# Patient Record
Sex: Female | Born: 1965
Health system: Southern US, Community
[De-identification: ages and names within clinical notes are randomized; demographics above are authoritative.]

## PROBLEM LIST (undated history)

## (undated) DIAGNOSIS — M329 Systemic lupus erythematosus, unspecified: Secondary | ICD-10-CM

## (undated) DIAGNOSIS — M109 Gout, unspecified: Secondary | ICD-10-CM

## (undated) DIAGNOSIS — I739 Peripheral vascular disease, unspecified: Secondary | ICD-10-CM

## (undated) DIAGNOSIS — M1990 Unspecified osteoarthritis, unspecified site: Secondary | ICD-10-CM

## (undated) DIAGNOSIS — K219 Gastro-esophageal reflux disease without esophagitis: Secondary | ICD-10-CM

## (undated) DIAGNOSIS — U071 COVID-19: Secondary | ICD-10-CM

## (undated) DIAGNOSIS — S68119A Complete traumatic metacarpophalangeal amputation of unspecified finger, initial encounter: Secondary | ICD-10-CM

## (undated) DIAGNOSIS — I10 Essential (primary) hypertension: Secondary | ICD-10-CM

## (undated) DIAGNOSIS — I509 Heart failure, unspecified: Secondary | ICD-10-CM

## (undated) DIAGNOSIS — R011 Cardiac murmur, unspecified: Secondary | ICD-10-CM

## (undated) DIAGNOSIS — I219 Acute myocardial infarction, unspecified: Secondary | ICD-10-CM

## (undated) DIAGNOSIS — E7800 Pure hypercholesterolemia, unspecified: Secondary | ICD-10-CM

## (undated) DIAGNOSIS — IMO0002 Reserved for concepts with insufficient information to code with codable children: Secondary | ICD-10-CM

## (undated) DIAGNOSIS — N189 Chronic kidney disease, unspecified: Secondary | ICD-10-CM

## (undated) DIAGNOSIS — R7989 Other specified abnormal findings of blood chemistry: Secondary | ICD-10-CM

## (undated) HISTORY — PX: OTHER SURGICAL HISTORY: SHX169

## (undated) HISTORY — PX: CARDIAC CATHETERIZATION: SHX172

---

## 2005-09-06 ENCOUNTER — Ambulatory Visit: Payer: Self-pay | Admitting: Internal Medicine

## 2005-10-06 ENCOUNTER — Ambulatory Visit: Payer: Self-pay | Admitting: Internal Medicine

## 2005-10-08 ENCOUNTER — Encounter (INDEPENDENT_AMBULATORY_CARE_PROVIDER_SITE_OTHER): Payer: Self-pay | Admitting: *Deleted

## 2005-10-08 ENCOUNTER — Ambulatory Visit: Payer: Self-pay | Admitting: Internal Medicine

## 2005-10-08 LAB — CONVERTED CEMR LAB
Bilirubin Urine: NEGATIVE
Ketones, ur: NEGATIVE mg/dL
Nitrite: POSITIVE
Protein, ur: 30 mg/dL
RBC count: 4.41 10*6/uL
Specific Gravity, Urine: 1.015
Urine Glucose: NEGATIVE mg/dL
Urobilinogen, UA: 1
WBC, blood: 3.8 10*3/uL
pH: 6

## 2005-10-22 ENCOUNTER — Ambulatory Visit: Payer: Self-pay | Admitting: Internal Medicine

## 2005-11-19 ENCOUNTER — Ambulatory Visit: Payer: Self-pay | Admitting: Internal Medicine

## 2005-12-24 ENCOUNTER — Ambulatory Visit: Payer: Self-pay | Admitting: Internal Medicine

## 2006-02-02 ENCOUNTER — Encounter: Payer: Self-pay | Admitting: Internal Medicine

## 2006-02-02 DIAGNOSIS — I10 Essential (primary) hypertension: Secondary | ICD-10-CM | POA: Insufficient documentation

## 2006-02-02 DIAGNOSIS — G43909 Migraine, unspecified, not intractable, without status migrainosus: Secondary | ICD-10-CM | POA: Insufficient documentation

## 2006-02-02 DIAGNOSIS — N318 Other neuromuscular dysfunction of bladder: Secondary | ICD-10-CM | POA: Insufficient documentation

## 2006-02-02 DIAGNOSIS — K219 Gastro-esophageal reflux disease without esophagitis: Secondary | ICD-10-CM | POA: Insufficient documentation

## 2006-02-02 DIAGNOSIS — M109 Gout, unspecified: Secondary | ICD-10-CM | POA: Insufficient documentation

## 2006-02-02 DIAGNOSIS — F329 Major depressive disorder, single episode, unspecified: Secondary | ICD-10-CM | POA: Insufficient documentation

## 2006-02-02 DIAGNOSIS — J309 Allergic rhinitis, unspecified: Secondary | ICD-10-CM | POA: Insufficient documentation

## 2006-02-02 DIAGNOSIS — J45909 Unspecified asthma, uncomplicated: Secondary | ICD-10-CM | POA: Insufficient documentation

## 2006-02-02 DIAGNOSIS — K589 Irritable bowel syndrome without diarrhea: Secondary | ICD-10-CM | POA: Insufficient documentation

## 2006-02-07 ENCOUNTER — Encounter (INDEPENDENT_AMBULATORY_CARE_PROVIDER_SITE_OTHER): Payer: Self-pay | Admitting: *Deleted

## 2006-02-07 ENCOUNTER — Ambulatory Visit: Payer: Self-pay | Admitting: Internal Medicine

## 2006-02-07 LAB — CONVERTED CEMR LAB
Cholesterol: 208 mg/dL
HDL: 58 mg/dL
LDL Cholesterol: 133 mg/dL
Total CHOL/HDL Ratio: 3.6
Triglycerides: 87 mg/dL
VLDL: 17 mg/dL

## 2006-03-10 ENCOUNTER — Ambulatory Visit: Payer: Self-pay | Admitting: Internal Medicine

## 2006-03-10 LAB — CONVERTED CEMR LAB
ALT: 136 units/L — ABNORMAL HIGH (ref 0–35)
AST: 161 units/L — ABNORMAL HIGH (ref 0–37)
Albumin: 3.9 g/dL (ref 3.5–5.2)
Alkaline Phosphatase: 96 units/L (ref 39–117)
BUN: 6 mg/dL (ref 6–23)
Basophils Absolute: 0 10*3/uL (ref 0.0–0.1)
Basophils Relative: 0 % (ref 0–1)
CO2: 31 meq/L (ref 19–32)
CRP: 0.9 mg/dL — ABNORMAL HIGH (ref <0.6)
Calcium: 9 mg/dL (ref 8.4–10.5)
Chloride: 101 meq/L (ref 96–112)
Creatinine, Ser: 0.74 mg/dL (ref 0.40–1.20)
Eosinophils Relative: 2 % (ref 0–5)
Glucose, Bld: 107 mg/dL — ABNORMAL HIGH (ref 70–99)
HCT: 45.9 % (ref 36.0–46.0)
Hemoglobin: 15.1 g/dL — ABNORMAL HIGH (ref 12.0–15.0)
Lymphocytes Relative: 30 % (ref 12–46)
Lymphs Abs: 1.5 10*3/uL (ref 0.7–3.3)
MCHC: 33 g/dL (ref 30.0–36.0)
MCV: 95.1 fL (ref 78.0–100.0)
Monocytes Absolute: 0.6 10*3/uL (ref 0.2–0.7)
Monocytes Relative: 11 % (ref 3–11)
Neutro Abs: 2.7 10*3/uL (ref 1.7–7.7)
Neutrophils Relative %: 55 % (ref 43–77)
Platelets: 177 10*3/uL (ref 150–400)
Potassium: 3 meq/L — ABNORMAL LOW (ref 3.5–5.3)
RBC: 4.82 M/uL (ref 3.87–5.11)
RDW: 11.8 % (ref 11.5–14.0)
Sed Rate: 9 mm/hr (ref 0–22)
Sodium: 141 meq/L (ref 135–145)
Total Bilirubin: 2.1 mg/dL — ABNORMAL HIGH (ref 0.3–1.2)
Total Protein: 7.6 g/dL (ref 6.0–8.3)
WBC: 4.9 10*3/uL (ref 4.0–10.5)

## 2006-04-21 ENCOUNTER — Encounter (INDEPENDENT_AMBULATORY_CARE_PROVIDER_SITE_OTHER): Payer: Self-pay | Admitting: Internal Medicine

## 2006-05-17 ENCOUNTER — Telehealth (INDEPENDENT_AMBULATORY_CARE_PROVIDER_SITE_OTHER): Payer: Self-pay | Admitting: *Deleted

## 2006-05-24 ENCOUNTER — Encounter (INDEPENDENT_AMBULATORY_CARE_PROVIDER_SITE_OTHER): Payer: Self-pay | Admitting: *Deleted

## 2006-05-25 ENCOUNTER — Ambulatory Visit: Payer: Self-pay | Admitting: Internal Medicine

## 2006-05-25 DIAGNOSIS — M329 Systemic lupus erythematosus, unspecified: Secondary | ICD-10-CM | POA: Insufficient documentation

## 2006-06-02 ENCOUNTER — Encounter (INDEPENDENT_AMBULATORY_CARE_PROVIDER_SITE_OTHER): Payer: Self-pay | Admitting: Internal Medicine

## 2006-06-22 ENCOUNTER — Ambulatory Visit: Payer: Self-pay | Admitting: Internal Medicine

## 2006-06-26 ENCOUNTER — Encounter (INDEPENDENT_AMBULATORY_CARE_PROVIDER_SITE_OTHER): Payer: Self-pay | Admitting: Internal Medicine

## 2006-08-16 ENCOUNTER — Ambulatory Visit: Payer: Self-pay | Admitting: Internal Medicine

## 2006-09-27 ENCOUNTER — Ambulatory Visit: Payer: Self-pay | Admitting: Internal Medicine

## 2006-09-29 ENCOUNTER — Telehealth (INDEPENDENT_AMBULATORY_CARE_PROVIDER_SITE_OTHER): Payer: Self-pay | Admitting: *Deleted

## 2006-09-29 LAB — CONVERTED CEMR LAB
ALT: 140 units/L — ABNORMAL HIGH (ref 0–35)
AST: 139 units/L — ABNORMAL HIGH (ref 0–37)
Albumin: 4.4 g/dL (ref 3.5–5.2)
Alkaline Phosphatase: 98 units/L (ref 39–117)
BUN: 8 mg/dL (ref 6–23)
Basophils Absolute: 0 10*3/uL (ref 0.0–0.1)
Basophils Relative: 0 % (ref 0–1)
CO2: 24 meq/L (ref 19–32)
CRP: 0.8 mg/dL — ABNORMAL HIGH (ref <0.6)
Calcium: 9 mg/dL (ref 8.4–10.5)
Chloride: 103 meq/L (ref 96–112)
Creatinine, Ser: 0.71 mg/dL (ref 0.40–1.20)
Eosinophils Absolute: 0.1 10*3/uL (ref 0.0–0.7)
Eosinophils Relative: 2 % (ref 0–5)
Glucose, Bld: 135 mg/dL — ABNORMAL HIGH (ref 70–99)
HCT: 47.6 % — ABNORMAL HIGH (ref 36.0–46.0)
Hemoglobin: 15.9 g/dL — ABNORMAL HIGH (ref 12.0–15.0)
Lymphocytes Relative: 35 % (ref 12–46)
Lymphs Abs: 1.7 10*3/uL (ref 0.7–3.3)
MCHC: 33.4 g/dL (ref 30.0–36.0)
MCV: 93.5 fL (ref 78.0–100.0)
Monocytes Absolute: 0.7 10*3/uL (ref 0.2–0.7)
Monocytes Relative: 14 % — ABNORMAL HIGH (ref 3–11)
Neutro Abs: 2.4 10*3/uL (ref 1.7–7.7)
Neutrophils Relative %: 49 % (ref 43–77)
Platelets: 143 10*3/uL — ABNORMAL LOW (ref 150–400)
Potassium: 3.3 meq/L — ABNORMAL LOW (ref 3.5–5.3)
RBC: 5.09 M/uL (ref 3.87–5.11)
RDW: 12.4 % (ref 11.5–14.0)
Sed Rate: 5 mm/hr (ref 0–22)
Sodium: 144 meq/L (ref 135–145)
Total Bilirubin: 1.7 mg/dL — ABNORMAL HIGH (ref 0.3–1.2)
Total Protein: 8.1 g/dL (ref 6.0–8.3)
WBC: 4.8 10*3/uL (ref 4.0–10.5)

## 2006-11-08 ENCOUNTER — Ambulatory Visit: Payer: Self-pay | Admitting: Internal Medicine

## 2006-11-17 ENCOUNTER — Encounter (INDEPENDENT_AMBULATORY_CARE_PROVIDER_SITE_OTHER): Payer: Self-pay | Admitting: Internal Medicine

## 2006-11-17 LAB — CONVERTED CEMR LAB
ALT: 89 units/L
AST: 123 units/L
Albumin: 3.9 g/dL
Alkaline Phosphatase: 90 units/L
BUN: 8 mg/dL
CO2: 25 meq/L
Calcium: 9.4 mg/dL
Creatinine, Ser: 1 mg/dL
Glucose, Bld: 79 mg/dL
HCT: 42.5 %
Hemoglobin: 14.5 g/dL
MCHC: 34.1 g/dL
MCV: 92.8 fL
Platelets: 121 10*3/uL
Potassium: 3.6 meq/L
RBC: 4.57 M/uL
RDW: 12.1 %
Sed Rate: 14 mm/hr
Sodium: 138 meq/L
Total Bilirubin: 2 mg/dL
Total Protein: 7.2 g/dL
WBC: 4 10*3/uL

## 2006-12-20 ENCOUNTER — Telehealth (INDEPENDENT_AMBULATORY_CARE_PROVIDER_SITE_OTHER): Payer: Self-pay | Admitting: *Deleted

## 2006-12-20 ENCOUNTER — Ambulatory Visit: Payer: Self-pay | Admitting: Internal Medicine

## 2006-12-21 ENCOUNTER — Encounter (INDEPENDENT_AMBULATORY_CARE_PROVIDER_SITE_OTHER): Payer: Self-pay | Admitting: Internal Medicine

## 2006-12-29 ENCOUNTER — Encounter (INDEPENDENT_AMBULATORY_CARE_PROVIDER_SITE_OTHER): Payer: Self-pay | Admitting: Internal Medicine

## 2007-02-06 ENCOUNTER — Ambulatory Visit: Payer: Self-pay | Admitting: Internal Medicine

## 2007-02-06 ENCOUNTER — Telehealth (INDEPENDENT_AMBULATORY_CARE_PROVIDER_SITE_OTHER): Payer: Self-pay | Admitting: *Deleted

## 2007-03-28 ENCOUNTER — Ambulatory Visit: Payer: Self-pay | Admitting: Internal Medicine

## 2007-03-28 LAB — CONVERTED CEMR LAB
Bacteria, UA: NONE SEEN
Bilirubin Urine: NEGATIVE
Glucose, Urine, Semiquant: NEGATIVE
Ketones, urine, test strip: NEGATIVE
Nitrite: NEGATIVE
Protein, U semiquant: NEGATIVE
RBC / HPF: NONE SEEN (ref <3)
Specific Gravity, Urine: 1.01
Urobilinogen, UA: 0.2
WBC, UA: NONE SEEN cells/hpf (ref <3)
pH: 5

## 2007-03-29 ENCOUNTER — Telehealth (INDEPENDENT_AMBULATORY_CARE_PROVIDER_SITE_OTHER): Payer: Self-pay | Admitting: *Deleted

## 2007-03-29 LAB — CONVERTED CEMR LAB
ALT: 110 units/L — ABNORMAL HIGH (ref 0–35)
AST: 146 units/L — ABNORMAL HIGH (ref 0–37)
Albumin: 4.5 g/dL (ref 3.5–5.2)
Alkaline Phosphatase: 95 units/L (ref 39–117)
BUN: 6 mg/dL (ref 6–23)
Basophils Absolute: 0 10*3/uL (ref 0.0–0.1)
Basophils Relative: 0 % (ref 0–1)
CO2: 22 meq/L (ref 19–32)
CRP: 0.6 mg/dL — ABNORMAL HIGH (ref <0.6)
Calcium: 9.2 mg/dL (ref 8.4–10.5)
Chloride: 102 meq/L (ref 96–112)
Creatinine, Ser: 0.71 mg/dL (ref 0.40–1.20)
Eosinophils Absolute: 0 10*3/uL (ref 0.0–0.7)
Eosinophils Relative: 1 % (ref 0–5)
Glucose, Bld: 100 mg/dL — ABNORMAL HIGH (ref 70–99)
HCT: 45.6 % (ref 36.0–46.0)
Hemoglobin: 15.2 g/dL — ABNORMAL HIGH (ref 12.0–15.0)
Lymphocytes Relative: 50 % — ABNORMAL HIGH (ref 12–46)
Lymphs Abs: 2 10*3/uL (ref 0.7–4.0)
MCHC: 33.3 g/dL (ref 30.0–36.0)
MCV: 93.6 fL (ref 78.0–100.0)
Monocytes Absolute: 0.4 10*3/uL (ref 0.1–1.0)
Monocytes Relative: 11 % (ref 3–12)
Neutro Abs: 1.5 10*3/uL — ABNORMAL LOW (ref 1.7–7.7)
Neutrophils Relative %: 38 % — ABNORMAL LOW (ref 43–77)
Platelets: 139 10*3/uL — ABNORMAL LOW (ref 150–400)
Potassium: 4.3 meq/L (ref 3.5–5.3)
RBC: 4.87 M/uL (ref 3.87–5.11)
RDW: 12.5 % (ref 11.5–15.5)
Sed Rate: 9 mm/hr (ref 0–22)
Sodium: 141 meq/L (ref 135–145)
Total Bilirubin: 1.6 mg/dL — ABNORMAL HIGH (ref 0.3–1.2)
Total CK: 121 units/L (ref 7–177)
Total Protein: 8.3 g/dL (ref 6.0–8.3)
WBC: 4 10*3/uL (ref 4.0–10.5)

## 2007-04-14 ENCOUNTER — Encounter (INDEPENDENT_AMBULATORY_CARE_PROVIDER_SITE_OTHER): Payer: Self-pay | Admitting: Internal Medicine

## 2007-05-03 ENCOUNTER — Ambulatory Visit: Payer: Self-pay | Admitting: Cardiology

## 2007-05-22 ENCOUNTER — Encounter (INDEPENDENT_AMBULATORY_CARE_PROVIDER_SITE_OTHER): Payer: Self-pay | Admitting: Internal Medicine

## 2007-05-23 ENCOUNTER — Ambulatory Visit: Payer: Self-pay | Admitting: Internal Medicine

## 2007-05-23 LAB — CONVERTED CEMR LAB: Hemoglobin: 16.4 g/dL

## 2007-05-25 ENCOUNTER — Telehealth (INDEPENDENT_AMBULATORY_CARE_PROVIDER_SITE_OTHER): Payer: Self-pay | Admitting: *Deleted

## 2007-05-25 ENCOUNTER — Ambulatory Visit: Payer: Self-pay | Admitting: Internal Medicine

## 2007-07-07 ENCOUNTER — Ambulatory Visit: Payer: Self-pay | Admitting: Internal Medicine

## 2007-08-18 ENCOUNTER — Encounter (INDEPENDENT_AMBULATORY_CARE_PROVIDER_SITE_OTHER): Payer: Self-pay | Admitting: Internal Medicine

## 2007-08-24 ENCOUNTER — Ambulatory Visit: Payer: Self-pay | Admitting: Internal Medicine

## 2007-08-25 LAB — CONVERTED CEMR LAB
ALT: 78 units/L — ABNORMAL HIGH (ref 0–35)
AST: 83 units/L — ABNORMAL HIGH (ref 0–37)
Albumin: 4.3 g/dL (ref 3.5–5.2)
Alkaline Phosphatase: 75 units/L (ref 39–117)
BUN: 7 mg/dL (ref 6–23)
Basophils Absolute: 0 10*3/uL (ref 0.0–0.1)
Basophils Relative: 1 % (ref 0–1)
CO2: 24 meq/L (ref 19–32)
Calcium: 9.1 mg/dL (ref 8.4–10.5)
Chloride: 102 meq/L (ref 96–112)
Cholesterol: 210 mg/dL — ABNORMAL HIGH (ref 0–200)
Creatinine, Ser: 0.61 mg/dL (ref 0.40–1.20)
Eosinophils Absolute: 0 10*3/uL (ref 0.0–0.7)
Eosinophils Relative: 1 % (ref 0–5)
Glucose, Bld: 97 mg/dL (ref 70–99)
HCT: 44 % (ref 36.0–46.0)
HDL: 76 mg/dL (ref >39)
Hemoglobin: 14.7 g/dL (ref 12.0–15.0)
LDL Cholesterol: 118 mg/dL — ABNORMAL HIGH (ref 0–99)
Lymphocytes Relative: 30 % (ref 12–46)
Lymphs Abs: 1.3 10*3/uL (ref 0.7–4.0)
MCHC: 33.4 g/dL (ref 30.0–36.0)
MCV: 95 fL (ref 78.0–100.0)
Monocytes Absolute: 0.8 10*3/uL (ref 0.1–1.0)
Monocytes Relative: 18 % — ABNORMAL HIGH (ref 3–12)
Neutro Abs: 2.1 10*3/uL (ref 1.7–7.7)
Neutrophils Relative %: 50 % (ref 43–77)
Platelets: 160 10*3/uL (ref 150–400)
Potassium: 3.7 meq/L (ref 3.5–5.3)
RBC: 4.63 M/uL (ref 3.87–5.11)
RDW: 12.2 % (ref 11.5–15.5)
Sodium: 139 meq/L (ref 135–145)
Total Bilirubin: 2.2 mg/dL — ABNORMAL HIGH (ref 0.3–1.2)
Total CHOL/HDL Ratio: 2.8
Total Protein: 7.5 g/dL (ref 6.0–8.3)
Triglycerides: 82 mg/dL (ref <150)
VLDL: 16 mg/dL (ref 0–40)
WBC: 4.1 10*3/uL (ref 4.0–10.5)

## 2007-10-05 ENCOUNTER — Ambulatory Visit: Payer: Self-pay | Admitting: Internal Medicine

## 2007-11-16 ENCOUNTER — Ambulatory Visit: Payer: Self-pay | Admitting: Internal Medicine

## 2007-11-16 DIAGNOSIS — R011 Cardiac murmur, unspecified: Secondary | ICD-10-CM | POA: Insufficient documentation

## 2007-11-16 LAB — CONVERTED CEMR LAB
ALT: 80 units/L — ABNORMAL HIGH (ref 0–35)
AST: 105 units/L — ABNORMAL HIGH (ref 0–37)
Albumin: 4.3 g/dL (ref 3.5–5.2)
Alkaline Phosphatase: 81 units/L (ref 39–117)
BUN: 9 mg/dL (ref 6–23)
Basophils Absolute: 0 10*3/uL (ref 0.0–0.1)
Basophils Relative: 0 % (ref 0–1)
Blood Glucose, Fingerstick: 85
CO2: 26 meq/L (ref 19–32)
Calcium: 9.7 mg/dL (ref 8.4–10.5)
Chloride: 104 meq/L (ref 96–112)
Creatinine, Ser: 0.66 mg/dL (ref 0.40–1.20)
Eosinophils Absolute: 0 10*3/uL (ref 0.0–0.7)
Eosinophils Relative: 0 % (ref 0–5)
Glucose, Bld: 103 mg/dL — ABNORMAL HIGH (ref 70–99)
HCT: 45.7 % (ref 36.0–46.0)
Hemoglobin: 14.2 g/dL
Hemoglobin: 15.3 g/dL — ABNORMAL HIGH (ref 12.0–15.0)
Inflenza A Ag: NEGATIVE
Influenza B Ag: NEGATIVE
Lymphocytes Relative: 24 % (ref 12–46)
Lymphs Abs: 1.3 10*3/uL (ref 0.7–4.0)
MCHC: 33.5 g/dL (ref 30.0–36.0)
MCV: 95.8 fL (ref 78.0–100.0)
Monocytes Absolute: 0.6 10*3/uL (ref 0.1–1.0)
Monocytes Relative: 11 % (ref 3–12)
Neutro Abs: 3.5 10*3/uL (ref 1.7–7.7)
Neutrophils Relative %: 64 % (ref 43–77)
Platelets: 139 10*3/uL — ABNORMAL LOW (ref 150–400)
Potassium: 3.6 meq/L (ref 3.5–5.3)
RBC: 4.77 M/uL (ref 3.87–5.11)
RDW: 12 % (ref 11.5–15.5)
Sodium: 139 meq/L (ref 135–145)
Total Bilirubin: 2.2 mg/dL — ABNORMAL HIGH (ref 0.3–1.2)
Total Protein: 8.1 g/dL (ref 6.0–8.3)
WBC: 5.4 10*3/uL (ref 4.0–10.5)

## 2007-11-17 ENCOUNTER — Ambulatory Visit: Payer: Self-pay | Admitting: Cardiology

## 2007-11-17 ENCOUNTER — Encounter (INDEPENDENT_AMBULATORY_CARE_PROVIDER_SITE_OTHER): Payer: Self-pay | Admitting: Internal Medicine

## 2007-11-17 LAB — CONVERTED CEMR LAB: TSH: 2.045 microintl units/mL (ref 0.350–4.50)

## 2007-12-07 ENCOUNTER — Encounter (INDEPENDENT_AMBULATORY_CARE_PROVIDER_SITE_OTHER): Payer: Self-pay | Admitting: Internal Medicine

## 2007-12-28 ENCOUNTER — Encounter (INDEPENDENT_AMBULATORY_CARE_PROVIDER_SITE_OTHER): Payer: Self-pay | Admitting: Internal Medicine

## 2007-12-28 ENCOUNTER — Encounter: Payer: Self-pay | Admitting: Internal Medicine

## 2007-12-28 ENCOUNTER — Ambulatory Visit: Payer: Self-pay | Admitting: Internal Medicine

## 2007-12-29 ENCOUNTER — Encounter (INDEPENDENT_AMBULATORY_CARE_PROVIDER_SITE_OTHER): Payer: Self-pay | Admitting: Internal Medicine

## 2008-01-16 ENCOUNTER — Encounter (INDEPENDENT_AMBULATORY_CARE_PROVIDER_SITE_OTHER): Payer: Self-pay | Admitting: Internal Medicine

## 2008-02-15 ENCOUNTER — Ambulatory Visit: Payer: Self-pay | Admitting: Internal Medicine

## 2008-02-15 DIAGNOSIS — I7300 Raynaud's syndrome without gangrene: Secondary | ICD-10-CM | POA: Insufficient documentation

## 2008-03-28 ENCOUNTER — Ambulatory Visit: Payer: Self-pay | Admitting: Internal Medicine

## 2008-03-28 DIAGNOSIS — R634 Abnormal weight loss: Secondary | ICD-10-CM | POA: Insufficient documentation

## 2008-03-29 ENCOUNTER — Encounter (INDEPENDENT_AMBULATORY_CARE_PROVIDER_SITE_OTHER): Payer: Self-pay | Admitting: Internal Medicine

## 2008-03-29 LAB — CONVERTED CEMR LAB
ALT: 153 units/L — ABNORMAL HIGH (ref 0–35)
AST: 191 units/L — ABNORMAL HIGH (ref 0–37)
Albumin: 4.3 g/dL (ref 3.5–5.2)
Alkaline Phosphatase: 90 units/L (ref 39–117)
BUN: 8 mg/dL (ref 6–23)
Basophils Absolute: 0 10*3/uL (ref 0.0–0.1)
Basophils Relative: 0 % (ref 0–1)
CO2: 22 meq/L (ref 19–32)
CRP: 0.5 mg/dL (ref <0.6)
Calcium: 9.1 mg/dL (ref 8.4–10.5)
Chloride: 104 meq/L (ref 96–112)
Creatinine, Ser: 0.65 mg/dL (ref 0.40–1.20)
Eosinophils Absolute: 0 10*3/uL (ref 0.0–0.7)
Eosinophils Relative: 0 % (ref 0–5)
Free T4: 0.86 ng/dL — ABNORMAL LOW (ref 0.89–1.80)
Glucose, Bld: 89 mg/dL (ref 70–99)
HCT: 44.1 % (ref 36.0–46.0)
Hemoglobin: 14.6 g/dL (ref 12.0–15.0)
Lymphocytes Relative: 28 % (ref 12–46)
Lymphs Abs: 1.2 10*3/uL (ref 0.7–4.0)
MCHC: 33.1 g/dL (ref 30.0–36.0)
MCV: 91.5 fL (ref 78.0–100.0)
Monocytes Absolute: 0.8 10*3/uL (ref 0.1–1.0)
Monocytes Relative: 18 % — ABNORMAL HIGH (ref 3–12)
Neutro Abs: 2.4 10*3/uL (ref 1.7–7.7)
Neutrophils Relative %: 54 % (ref 43–77)
Platelets: 119 10*3/uL — ABNORMAL LOW (ref 150–400)
Potassium: 3.8 meq/L (ref 3.5–5.3)
RBC: 4.82 M/uL (ref 3.87–5.11)
RDW: 12.1 % (ref 11.5–15.5)
Sed Rate: 17 mm/hr (ref 0–22)
Sodium: 140 meq/L (ref 135–145)
TSH: 2.153 microintl units/mL (ref 0.350–4.50)
Total Bilirubin: 1.6 mg/dL — ABNORMAL HIGH (ref 0.3–1.2)
Total Protein: 8 g/dL (ref 6.0–8.3)
WBC: 4.5 10*3/uL (ref 4.0–10.5)

## 2008-04-02 LAB — CONVERTED CEMR LAB: T3, Free: 3.2 pg/mL (ref 2.3–4.2)

## 2008-05-07 ENCOUNTER — Encounter (INDEPENDENT_AMBULATORY_CARE_PROVIDER_SITE_OTHER): Payer: Self-pay | Admitting: Internal Medicine

## 2008-05-09 ENCOUNTER — Ambulatory Visit: Payer: Self-pay | Admitting: Internal Medicine

## 2008-05-09 DIAGNOSIS — K7689 Other specified diseases of liver: Secondary | ICD-10-CM | POA: Insufficient documentation

## 2008-05-10 ENCOUNTER — Telehealth (INDEPENDENT_AMBULATORY_CARE_PROVIDER_SITE_OTHER): Payer: Self-pay | Admitting: Internal Medicine

## 2008-06-26 ENCOUNTER — Encounter (INDEPENDENT_AMBULATORY_CARE_PROVIDER_SITE_OTHER): Payer: Self-pay | Admitting: Internal Medicine

## 2008-07-08 ENCOUNTER — Ambulatory Visit: Payer: Self-pay | Admitting: Internal Medicine

## 2008-07-09 ENCOUNTER — Encounter (INDEPENDENT_AMBULATORY_CARE_PROVIDER_SITE_OTHER): Payer: Self-pay | Admitting: Internal Medicine

## 2008-07-10 LAB — CONVERTED CEMR LAB
ALT: 96 units/L — ABNORMAL HIGH (ref 0–35)
AST: 138 units/L — ABNORMAL HIGH (ref 0–37)
Albumin: 4.4 g/dL (ref 3.5–5.2)
Alkaline Phosphatase: 89 units/L (ref 39–117)
BUN: 7 mg/dL (ref 6–23)
Basophils Absolute: 0 10*3/uL (ref 0.0–0.1)
Basophils Relative: 0 % (ref 0–1)
CO2: 21 meq/L (ref 19–32)
CRP: 0.3 mg/dL (ref <0.6)
Calcium: 9.2 mg/dL (ref 8.4–10.5)
Chloride: 104 meq/L (ref 96–112)
Cholesterol: 225 mg/dL — ABNORMAL HIGH (ref 0–200)
Creatinine, Ser: 0.7 mg/dL (ref 0.40–1.20)
Eosinophils Absolute: 0.1 10*3/uL (ref 0.0–0.7)
Eosinophils Relative: 1 % (ref 0–5)
Glucose, Bld: 101 mg/dL — ABNORMAL HIGH (ref 70–99)
HCT: 46.4 % — ABNORMAL HIGH (ref 36.0–46.0)
HDL: 84 mg/dL (ref >39)
Hemoglobin: 15.3 g/dL — ABNORMAL HIGH (ref 12.0–15.0)
LDL Cholesterol: 118 mg/dL — ABNORMAL HIGH (ref 0–99)
Lymphocytes Relative: 32 % (ref 12–46)
Lymphs Abs: 1.6 10*3/uL (ref 0.7–4.0)
MCHC: 33 g/dL (ref 30.0–36.0)
MCV: 92.4 fL (ref 78.0–100.0)
Monocytes Absolute: 0.8 10*3/uL (ref 0.1–1.0)
Monocytes Relative: 15 % — ABNORMAL HIGH (ref 3–12)
Neutro Abs: 2.6 10*3/uL (ref 1.7–7.7)
Neutrophils Relative %: 52 % (ref 43–77)
Platelets: 150 10*3/uL (ref 150–400)
Potassium: 3.4 meq/L — ABNORMAL LOW (ref 3.5–5.3)
RBC: 5.02 M/uL (ref 3.87–5.11)
RDW: 12.2 % (ref 11.5–15.5)
Sed Rate: 6 mm/hr (ref 0–22)
Sodium: 143 meq/L (ref 135–145)
Total Bilirubin: 1.3 mg/dL — ABNORMAL HIGH (ref 0.3–1.2)
Total CHOL/HDL Ratio: 2.7
Total Protein: 8.1 g/dL (ref 6.0–8.3)
Triglycerides: 114 mg/dL (ref <150)
VLDL: 23 mg/dL (ref 0–40)
WBC: 5 10*3/uL (ref 4.0–10.5)

## 2008-08-07 ENCOUNTER — Encounter (INDEPENDENT_AMBULATORY_CARE_PROVIDER_SITE_OTHER): Payer: Self-pay | Admitting: Internal Medicine

## 2008-08-09 ENCOUNTER — Telehealth (INDEPENDENT_AMBULATORY_CARE_PROVIDER_SITE_OTHER): Payer: Self-pay | Admitting: *Deleted

## 2008-08-19 ENCOUNTER — Ambulatory Visit: Payer: Self-pay | Admitting: Internal Medicine

## 2008-08-19 DIAGNOSIS — E876 Hypokalemia: Secondary | ICD-10-CM | POA: Insufficient documentation

## 2008-08-20 ENCOUNTER — Encounter (INDEPENDENT_AMBULATORY_CARE_PROVIDER_SITE_OTHER): Payer: Self-pay | Admitting: Internal Medicine

## 2008-08-20 LAB — CONVERTED CEMR LAB
BUN: 8 mg/dL (ref 6–23)
CO2: 23 meq/L (ref 19–32)
Calcium: 9.8 mg/dL (ref 8.4–10.5)
Chloride: 101 meq/L (ref 96–112)
Creatinine, Ser: 0.66 mg/dL (ref 0.40–1.20)
Glucose, Bld: 95 mg/dL (ref 70–99)
Potassium: 3.9 meq/L (ref 3.5–5.3)
Sodium: 142 meq/L (ref 135–145)

## 2008-10-11 ENCOUNTER — Encounter (INDEPENDENT_AMBULATORY_CARE_PROVIDER_SITE_OTHER): Payer: Self-pay | Admitting: Internal Medicine

## 2010-03-31 NOTE — Medication Information (Signed)
Summary: hydrocodone  hydrocodone   Imported By: Curtis Sites 01/16/2008 10:13:43  _____________________________________________________________________  External Attachment:    Type:   Image     Comment:   External Document

## 2010-03-31 NOTE — Assessment & Plan Note (Signed)
Summary: HTN/SLE/elevated LFTs/port flush   Vital Signs:  Patient Profile:   45 Years Old Female Height:     64 inches (162.56 cm) O2 Sat:      96 % O2 treatment:    Room Air Pulse rate:   109 / minute Resp:     8 per minute BP sitting:   184 / 120  (right arm)  Vitals Entered By: Lutricia Horsfall (March 28, 2007 10:26 AM)                 Chief Complaint:  followup.  History of Present Illness: Here for routine follow up.  She says she is stressed out.  Since her father died she has had increased stress.  She is not close with her sisters.  She obviously does not want to discuss it though.  She has also broken up with her friend.  She did not take her blood pressure medications this morning.  She is scheduled to see Dr. Karilyn Cota in a couple of weeks.  She has follow up with Dr. Phylliss Bob on 13 Feb   Current Allergies: No known allergies   Past Medical History:    Reviewed history from 08/16/2006 and no changes required:       Allergic rhinitis       Asthma/Lupus lung       Depression       GERD       Hypertension       IBS       overactive bladder       Lupus with nephritis, Raynaud's, lung disease (Dr. Phylliss Bob)       gout       migraines       TMJ disorder      Physical Exam  General:     alert and overweight-appearing.   Lungs:     Clear to auscultation bilaterally with good air movement, normal expansion.  Heart:     Normal S1 and S2. No murmurs or extracardiac sounds. PMI is nondisplaced.     Impression & Recommendations:  Problem # 1:  HYPERTENSION (ICD-401.9) BP is up but she didn't take her medication.  She needs labs today.  Her updated medication list for this problem includes:    Lotensin 40 Mg Tabs (Benazepril hcl) ..... Once daily    Aldactone 25 Mg Tabs (Spironolactone) .[name]... 1 by mouth two times a day    Lasix 80 Mg Tabs (Furosemide) .[name]... 1 by mouth two times a day    Hydralazine Hcl 100 Mg Tabs (Hydralazine hcl) .[name]... 1 by mouth three  times a day    Plendil 10 Mg Tb24 (Felodipine) ..... Once daily    Nifedipine 60 Mg Tb24 (Nifedipine) .[name]... 1 by mouth two times a day  Orders: T-Comprehensive Metabolic Panel [id number]) Urinalysis-dipstick only (Medicare patient) ([id number])   Problem # 2:  LIVER FUNCTION TESTS, ABNORMAL (ICD-794.8) recheck LFT's today. Orders: T-Comprehensive Metabolic Panel [id number])   Problem # 3:  SYSTEMIC LUPUS ERYTHEMATOSUS (ICD-710.0) Labs drawn with other labs today as we are going to access her port and she will need blood work for appointment in February. Her updated medication list for this problem includes:    Prednisone 5 Mg Tabs (Prednisone) ..... Once daily    Plaquenil 200 Mg Tabs (Hydroxychloroquine sulfate) .[name]... 1 by mouth two times a day  Orders: T-CBC w/Diff ([id number]) T-Sed Rate (Automated) [id number]) T-C-Reactive Protein [id number]) T-CK Total ([id number])   Problem # 4:  FITTING/ADJUSTMENT, VASCULAR CATHETER (  ICD-V58.81) Port-a-cath flushed today. Orders: Port-a-Cath Flush (29562)   Complete Medication List: 1)  Lotensin 40 Mg Tabs (Benazepril hcl) .... Once daily 2)  Aldactone 25 Mg Tabs (Spironolactone) .[name].. 1 by mouth two times a day 3)  Lasix 80 Mg Tabs (Furosemide) .[name].. 1 by mouth two times a day 4)  Hydralazine Hcl 100 Mg Tabs (Hydralazine hcl) .[name].. 1 by mouth three times a day 5)  Flexeril 10 Mg Tabs (Cyclobenzaprine hcl) .... Three times a day 6)  Colchicine 0.6 Mg Tabs (Colchicine) .... Two times a day 7)  Plendil 10 Mg Tb24 (Felodipine) .... Once daily 8)  Prednisone 5 Mg Tabs (Prednisone) .... Once daily 9)  Allopurinol 100 Mg Tabs (Allopurinol) .... Once daily 10)  Fexofenadine Hcl 180 Mg Tabs (Fexofenadine hcl) .... Once daily 11)  Topamax 100 Mg Tabs (Topiramate) .[name].. 1 by mouth at bedtime 12)  Nasonex Susp (Mometasone furoate susp) 13)  Emla 2.5-2.5 % Crea (Lidocaine-prilocaine) 14)  Plaquenil 200 Mg Tabs (Hydroxychloroquine  sulfate) .[name].. 1 by mouth two times a day 15)  Nifedipine 60 Mg Tb24 (Nifedipine) .[name].. 1 by mouth two times a day 16)  Nitroglycerin 0.4 Mg Subl (Nitroglycerin) .... As needed 17)  Vicodin 5-500 Mg Tabs (Hydrocodone-acetaminophen) .... Every 4 hours as needed 18)  Zantac 150 Mg Tabs (Ranitidine hcl) .[name].. 1 by mouth as needed 19)  Metoclopramide Hcl 10 Mg Tabs (Metoclopramide hcl) .[name].. 1 by mouth every 6 hours as needed for headache 20)  Klor-con M20 20 Meq Tbcr (Potassium chloride crys cr) .[name].. 1 by mouth once daily   Left chest portacath flushedwithout difficulty using sterile technique.  Positive blood return.  Flushed with 10cc heparin.  Pt. tolerated well. .................................................................Marland KitchenMarland KitchenLutricia Horsfall  March 28, 2007 12:43 PM   ] Laboratory Results   Urine Tests  Date/Time Recieved: March 28, 2007 10:34 AM  Date/Time Reported: March 28, 2007 10:34 AM   Routine Urinalysis   Color: straw Appearance: Cloudy Glucose: negative   (Normal Range: Negative) Bilirubin: negative   (Normal Range: Negative) Ketone: negative   (Normal Range: Negative) Spec. Gravity: 1.010   (Normal Range: 1.003-1.035) Blood: large   (Normal Range: Negative) pH: 5.0   (Normal Range: 5.0-8.0) Protein: negative   (Normal Range: Negative) Urobilinogen: 0.2   (Normal Range: 0-1) Nitrite: negative   (Normal Range: Negative) Leukocyte Esterace: trace   (Normal Range: Negative)

## 2010-03-31 NOTE — Assessment & Plan Note (Signed)
Summary: HTN/port-a-cath flush   Vital Signs:  Patient Profile:   45 Years Old Female Height:     64 inches (162.56 cm) Pulse rate:   81 / minute Resp:     8 per minute BP sitting:   197 / 127  (right arm)  Vitals Entered By: Lutricia Horsfall (August 16, 2006 9:22 AM)               Serial Vital Signs/Assessments:  Time      Position  BP       Pulse  Resp  Temp     By 10:34 AM            184/122                        Lutricia Horsfall 11:01 AM            184/120                        Lutricia Horsfall 11:18 AM            168/120                        Lutricia Horsfall   Chief Complaint:  followup.  History of Present Illness: Crystal Patterson presents today for routine follow-up.  He did not take her  medications this morning because she says some of them make her sleepy.  She however did not drive herself here.  She is stable and last saw Dr. Phylliss Bob on the 22nd of April and has follow-up in two months.  she denies any chest pain, shortness of breath or palpitations at this time.  She denies any headaches.  she needs her Port-A-Cath flushed today.  Current Allergies: No known allergies   Past Medical History:    Allergic rhinitis    Asthma/Lupus lung    Depression    GERD    Hypertension    IBS    overactive bladder    Lupus with nephritis, Raynaud's, lung diaease (Dr. Phylliss Bob)    gout    migraines    TMJ disorder  Past Surgical History:    Reviewed history from 05/24/2006 and no changes required:       PORT PLACEMENT       right hand-2003       bilat knees-4 years ago    Family History:    Reviewed history from 05/24/2006 and no changes required:       father-67-DM       mother-64-THA       sisters x2       borhter x1       son-23       daughter-25  Social History:    Reviewed history from 05/24/2006 and no changes required:       Single lives alone       Never Smoked       Alcohol use-yes-0-2 drinks, <monthly       Drug use-yes-marijuana socially    Review  of Systems      See HPI   Physical Exam  General:     alert and overweight-appearing.      Impression & Recommendations:  Problem # 1:  HYPERTENSION (ICD-401.9) her Plendil does not seem to be in with her medicines.  She said she had a liver medicines refill the last week but does think she is missing  a bottle, but had not realized it previously.   After taking all of her medication except the Plendil her blood pressure Her updated medication list for this problem includes:    Lotensin 40 Mg Tabs (Benazepril hcl) ..... Once daily    Aldactone 25 Mg Tabs (Spironolactone) .[name]... D    Lasix 80 Mg Tabs (Furosemide) ..... Once daily    Hydralazine Hcl 100 Mg Tabs (Hydralazine hcl) .[name]... 1 by mouth three times a day    Plendil 10 Mg Tb24 (Felodipine) ..... Once daily    Nifedipine 60 Mg Tb24 (Nifedipine) .[name]... 1 by mouth two times a day  Orders: Clonidine 0.1mg  tab (EMRORAL) Clonidine 0.1mg  tab (EMRORAL)   Problem # 2:  FITTING/ADJUSTMENT, VASCULAR CATHETER (ICD-V58.81) Port-a-cath flushed today. Orders: Port-a-Cath Flush (09811)    Patient Instructions: 1)  Please schedule a follow-up appointment in 1 month. Left chest portacath flushed without difficulty using sterile technique.  Negative blood return.  Flushed with 10cc heparin.  Pt. tolerated well. .................................................................Marland KitchenMarland KitchenSherrie Gardner  August 16, 2006 10:01 AM  Clonidine 0.1mg  given per md order.  .................................................................Marland KitchenMarland KitchenSherrie Gardner  August 16, 2006 10:35 AM  Clonidine 0.1 given per md order. .................................................................Marland KitchenMarland KitchenSherrie Gardner  August 16, 2006 11:03 AM   Medication Administration  Medication # 1:    Medication: Clonidine 0.1mg  tab    Diagnosis: HYPERTENSION (ICD-401.9)    Dose: 1 tablet    Route: po    Patient tolerated medication without complications    Given by: Lutricia Horsfall (August 16, 2006 1:23 PM)  Medication # 2:    Medication: Clonidine 0.1mg  tab    Diagnosis: HYPERTENSION (ICD-401.9)    Dose: 1 tablet    Route: po    Patient tolerated medication without complications    Given by: Lutricia Horsfall (August 16, 2006 1:24 PM)  Orders Added: 1)  Clonidine 0.1mg  tab [EMRORAL] 2)  Clonidine 0.1mg  tab [EMRORAL] 3)  Est. Patient Level III [91478] 4)  Port-a-Cath Flush [29562]  Appended Document: HTN/port-a-cath flush Addendum to Problem #1:  After taking her home BP meds there was not substantial improvement in her BP.  She was given a total of 0.2mg  clonidine before BP started to improve.  According to [facility], she has not filled her Plendil since January.  Pt to refill Plendil, take all BP meds and f/u in 1 month, having taken her medications that morning.

## 2010-03-31 NOTE — Miscellaneous (Signed)
Summary: Orders Update - GI  Clinical Lists Changes  Orders: Added new Referral order of Gastroenterology Referral (GI) - Signed 

## 2010-03-31 NOTE — Assessment & Plan Note (Signed)
Summary: raynaud's/port flush   Vital Signs:  Patient profile:   45 year old female Weight:      151.25 pounds O2 Sat:      100 % Pulse rate:   92 / minute BP sitting:   112 / 70  (left arm)  Vitals Entered By: Micah Flesher, LPN (Jul 08, 2008 11:24 AM) CC: Port Flush, R hand pain   CC:  Port Flush and R hand pain.  History of Present Illness: Patient presents today for routine follow up.  She is overdue for her port to be flushed.  She missed her last appointment because she didn't have transportation.  The uncle who  normally drives her to appointments passed away 3 weeks ago.    Her complaint today is that the 2nd and 3rd fingers on her right hand are hurting at the tips.  She has been taking all of her medications and using the EMLA cream as well as wearing a glove but the fingers are causing her a great deal of pain.  Her appointmentwith Dr. Phylliss Bob is next month. Left chest port-a-cath flushed after blood was drawn.  Positive blood return.  Pt. tolerated well.   Allergies: No Known Drug Allergies  Past History:  Past Medical History:    Reviewed history from 03/28/2008 and no changes required:    Allergic rhinitis    Asthma/Lupus lung    Depression    GERD    Hypertension    IBS    overactive bladder    Lupus with nephritis, Raynaud's, lung disease (Dr. Phylliss Bob)    gout    migraines    TMJ disorder    Elevated liver enzymes--biopsy with hepatic steatosis  Physical Exam  General:  alert, well-developed, and well-nourished.   Extremities:  no tenderness, redness or swelling of hands or fingers.  Punctate areas of necrotic tissue at tips of fingers 2-4 on right and left hands with tenderness to palpation.     Impression & Recommendations:  Problem # 1:  RAYNAUDS SYNDROME (ICD-443.0) Assessment Improved Will check inflammatory markers in anticipation of her visit with Dr. Phylliss Bob next month.  Rx for vicodin sent to pharmacy.  Problem # 2:  FITTING/ADJUSTMENT, VASCULAR  CATHETER (ICD-V58.81)  Orders: Port-a-Cath Flush (16109)  Complete Medication List: 1)  Lotensin 40 Mg Tabs (Benazepril hcl) .... Once daily 2)  Aldactone 25 Mg Tabs (Spironolactone) .[name].. 1 by mouth two times a day 3)  Lasix 80 Mg Tabs (Furosemide) .[name].. 1 by mouth two times a day 4)  Hydralazine Hcl 100 Mg Tabs (Hydralazine hcl) .[name].. 1 by mouth three times a day 5)  Flexeril 10 Mg Tabs (Cyclobenzaprine hcl) .... Three times a day 6)  Allopurinol 100 Mg Tabs (Allopurinol) .... Once daily 7)  Fexofenadine Hcl 180 Mg Tabs (Fexofenadine hcl) .... Once daily as needed 8)  Emla 2.5-2.5 % Crea (Lidocaine-prilocaine) 9)  Vicodin 5-500 Mg Tabs (Hydrocodone-acetaminophen) .... Every 4 hours as needed 10)  Metoclopramide Hcl 10 Mg Tabs (Metoclopramide hcl) .[name].. 1 by mouth every 6 hours as needed for headache 11)  Amlodipine Besylate 10 Mg Tabs (Amlodipine besylate) .[name].. 1 by mouth once daily 12)  Felodipine 10 Mg Xr24h-tab (Felodipine) .[name].. 1 by mouth once daily 13)  Omeprazole 20 Mg Cpdr (Omeprazole) .[name].. 1 by mouth two times a day  Other Orders: T-C-Reactive Protein [id number]) T-CBC w/Diff [id number]) T-Sed Rate (Automated) [id number]) T-Comprehensive Metabolic Panel [id number]) T-Lipid Profile ([id number])  Patient Instructions: 1)  Please schedule a follow-up appointment  in 6 weeks. Prescriptions: VICODIN 5-500 MG TABS (HYDROCODONE-ACETAMINOPHEN) every 4 hours as needed  #60 x 0   Entered and Authorized by:   Erle Crocker MD   Signed by:   Erle Crocker MD on 07/08/2008   Method used:   Printed then faxed to ...       [facility] [address] (retail)       [address]       [place], [place]  [address]       Ph: [phone] or [phone]       Fax: [phone]   RxID:   [id number]

## 2010-03-31 NOTE — Progress Notes (Signed)
[facility]  Phone Note Outgoing Call   Call placed by: Sonny Dandy,  May 25, 2007 3:46 PM Summary of Call:  [facility] contacted and they will arrange appointment and call patient with date - time Initial call taken by: Sonny Dandy,  May 25, 2007 3:47 PM

## 2010-03-31 NOTE — Assessment & Plan Note (Signed)
Summary: murmur/dizziness   Vital Signs:  Patient Profile:   45 Years Old Female Height:     64 inches (162.56 cm) Weight:      152 pounds Temp:     98.9 degrees F tympanic Pulse rate:   60 / minute Resp:     9 per minute BP sitting:   116 / 72  (right arm)  Vitals Entered By: Lutricia Horsfall (November 16, 2007 10:55 AM)             CBG Result 85     History of Present Illness: Patient presents today for routine follow up.  She says she woke up this morning feeling weak and dizzy like the room is spinning.  She is feeling flushed also.  She says she has been eating but has lost weight.   She saw Dr. Karilyn Cota and she is going to be sent to [facility] for a liver biopsy.    Current Allergies: No known allergies   Past Medical History:    Reviewed history from 03/28/2007 and no changes required:       Allergic rhinitis       Asthma/Lupus lung       Depression       GERD       Hypertension       IBS       overactive bladder       Lupus with nephritis, Raynaud's, lung disease (Dr. Phylliss Bob)       gout       migraines       TMJ disorder      Physical Exam  General:     slightly ill appearing Ears:     TM clear on right, there is some redness on left Lungs:     Clear to auscultation bilaterally with good air movement, normal expansion.  Heart:     Hyperdynamic heart sounds with holosystolic murmur.  After 500cc IVF, the heart sounds calmed but the murmur is still present.    Impression & Recommendations:  Problem # 1:  CARDIAC MURMUR (ICD-785.2) New onset.  Need echo for further evaluation.  She is not anemic.  Blood culture set obtained through port a cath.    Orders: 2 D Echo (2 D Echo)   Problem # 2:  DIZZINESS (ICD-780.4) ? viral infection.  Sounds mostly like vertigo not orthostasis.  WIll follow. Her updated medication list for this problem includes:    Fexofenadine Hcl 180 Mg Tabs (Fexofenadine hcl) ..... Once daily  Orders: Intravenous Infusion  Therapy Diag 1 hr (16109) T-Comprehensive Metabolic Panel ([id number]) T-CBC w/Diff ([id number]) Capillary Blood Glucose (82948) Hgb (85018) Flu A+B (87400)   Complete Medication List: 1)  Lotensin 40 Mg Tabs (Benazepril hcl) .... Once daily 2)  Aldactone 25 Mg Tabs (Spironolactone) .[name].. 1 by mouth two times a day 3)  Lasix 80 Mg Tabs (Furosemide) .[name].. 1 by mouth two times a day 4)  Hydralazine Hcl 100 Mg Tabs (Hydralazine hcl) .[name].. 1 by mouth three times a day 5)  Flexeril 10 Mg Tabs (Cyclobenzaprine hcl) .... Three times a day 6)  Colchicine 0.6 Mg Tabs (Colchicine) .... Two times a day 7)  Prednisone 5 Mg Tabs (Prednisone) .... Once daily 8)  Allopurinol 100 Mg Tabs (Allopurinol) .... Once daily 9)  Fexofenadine Hcl 180 Mg Tabs (Fexofenadine hcl) .... Once daily 10)  Topamax 100 Mg Tabs (Topiramate) .[name].. 1 by mouth at bedtime 11)  Nasonex Susp (Mometasone furoate susp) 12)  Emla 2.5-2.5 % Crea (Lidocaine-prilocaine) 13)  Plaquenil 200 Mg Tabs (Hydroxychloroquine sulfate) .[name].. 1 by mouth two times a day 14)  Nitroglycerin 0.4 Mg Subl (Nitroglycerin) .... As needed 15)  Vicodin 5-500 Mg Tabs (Hydrocodone-acetaminophen) .... Every 4 hours as needed 16)  Zantac 150 Mg Tabs (Ranitidine hcl) .[name].. 1 by mouth as needed 17)  Metoclopramide Hcl 10 Mg Tabs (Metoclopramide hcl) .[name].. 1 by mouth every 6 hours as needed for headache 18)  Klor-con M20 20 Meq Tbcr (Potassium chloride crys cr) .[name].. 1 by mouth once daily 19)  Amlodipine Besylate 10 Mg Tabs (Amlodipine besylate) .[name].. 1 by mouth once daily 20)  Felodipine 10 Mg Xr24h-tab (Felodipine) .[name].. 1 by mouth once daily 21)  Omeprazole 20 Mg Cpdr (Omeprazole) .[name].. 1 by mouth two times a day    ] Laboratory Results   Blood Tests   Date/Time Received: November 16, 2007 10:55 AM   CBG Random:: 85mg /dL   CBC   HGB:  40.9 g/dL   (Normal Range: 81.1-91.4 in Males, 12.0-15.0 in Females) Date/Time Received: November 16, 2007 11:26 AM    Other Tests  Influenza A: negative Influenza B: negative Comments: Lot # [id number] 12-17-07  Kit Test Internal QC: Positive   (Normal Range: Negative)     Medication Administration  Infusion # 1:    Diagnosis: CARDIAC MURMUR (ICD-785.2)    Started: 11:25 AM    Solution: 0.9% NS    Instructions: wo    Route: IV infusion    Site: left portacath    Ordered byJen Mow    Administered by: Sherrie Gardner-November 16, 2007 11:51 AM    Patient tolerated infusion without complications  Orders Added: 1)  Intravenous Infusion Therapy Diag 1 hr [96360] 2)  T-Comprehensive Metabolic Panel [[id number]] 3)  T-CBC w/Diff [[id number]] 4)  2 D Echo [2 D Echo] 5)  Est. Patient Level III [69629] 6)  Capillary Blood Glucose [82948] 7)  Hgb [85018] 8)  Flu A+B [87400]  Appended Document: murmur/dizziness 2D Echo scheduled at [facility] on 18 Sep 09 at 0930. pt must register at the front entrance at 0900.  Pt advised of the above.

## 2010-03-31 NOTE — Assessment & Plan Note (Signed)
Summary: elevated LFT's/HTN/port flush   Vital Signs:  Patient Profile:   45 Years Old Female Height:     64 inches (162.56 cm) Pulse rate:   62 / minute Resp:     8 per minute BP sitting:   146 / 104  (right arm)  Vitals Entered By: Lutricia Horsfall (February 06, 2007 10:25 AM)                 Chief Complaint:  followup.  History of Present Illness: Here for routine follow up.  She is feeling well.  She has some pain in her hands but wears gloves when ever she is outdoors in the old weather.  She missed her appointment with Dr. Karilyn Cota for the evaluation of the elevated liver enzymes.   Next appointment with Dr. Phylliss Bob is next month.    She is due for her port flush today.  Current Allergies: No known allergies   Past Medical History:    Reviewed history from 08/16/2006 and no changes required:       Allergic rhinitis       Asthma/Lupus lung       Depression       GERD       Hypertension       IBS       overactive bladder       Lupus with nephritis, Raynaud's, lung diaease (Dr. Phylliss Bob)       gout       migraines       TMJ disorder  Past Surgical History:    Reviewed history from 05/24/2006 and no changes required:       PORT PLACEMENT       right hand-2003       bilat knees-4 years ago    Family History:    father-70--deceased from colon cancer, DM    mother-64-THA    sisters x2--healthy    sister x1 deceased from GSW    brother x1    brother x1 deceased from complications of a staph infection    son-24--healthy    daughter-25--healthy  Social History:    Reviewed history from 05/24/2006 and no changes required:       Single lives alone       Never Smoked       Alcohol use-yes-0-2 drinks, <monthly       Drug use-yes-marijuana socially     Physical Exam  General:     alert and overweight-appearing.     Left chest portacath flushed without difficulty using sterile technique.  Positive blood return.  Flushed with 10cc heparin.  Pt. tolerated  well. .................................................................Marland KitchenMarland KitchenLutricia Horsfall  February 06, 2007 1:05 PM  Impression & Recommendations:  Problem # 1:  LIVER FUNCTION TESTS, ABNORMAL (ICD-794.8) We will refer her again to Dr. Karilyn Cota for evaluation of her elevated liver enzymes.    Problem # 2:  FITTING/ADJUSTMENT, VASCULAR CATHETER (ICD-V58.81)  Orders: Port-a-Cath Flush (16109)   Problem # 3:  HYPERTENSION (ICD-401.9) Blood pressure remains elevated, but better than usual for her. Her updated medication list for this problem includes:    Lotensin 40 Mg Tabs (Benazepril hcl) ..... Once daily    Aldactone 25 Mg Tabs (Spironolactone) .[name]... 1 by mouth two times a day    Lasix 80 Mg Tabs (Furosemide) .[name]... 1 by mouth two times a day    Hydralazine Hcl 100 Mg Tabs (Hydralazine hcl) .[name]... 1 by mouth three times a day    Plendil 10  Mg Tb24 (Felodipine) ..... Once daily    Nifedipine 60 Mg Tb24 (Nifedipine) .[name]... 1 by mouth two times a day   Complete Medication List: 1)  Lotensin 40 Mg Tabs (Benazepril hcl) .... Once daily 2)  Aldactone 25 Mg Tabs (Spironolactone) .[name].. 1 by mouth two times a day 3)  Lasix 80 Mg Tabs (Furosemide) .[name].. 1 by mouth two times a day 4)  Hydralazine Hcl 100 Mg Tabs (Hydralazine hcl) .[name].. 1 by mouth three times a day 5)  Flexeril 10 Mg Tabs (Cyclobenzaprine hcl) .... Three times a day 6)  Colchicine 0.6 Mg Tabs (Colchicine) .... Two times a day 7)  Plendil 10 Mg Tb24 (Felodipine) .... Once daily 8)  Prednisone 5 Mg Tabs (Prednisone) .... Once daily 9)  Allopurinol 100 Mg Tabs (Allopurinol) .... Once daily 10)  Fexofenadine Hcl 180 Mg Tabs (Fexofenadine hcl) .... Once daily 11)  Topamax 100 Mg Tabs (Topiramate) .[name].. 1 by mouth at bedtime 12)  Nasonex Susp (Mometasone furoate susp) 13)  Emla 2.5-2.5 % Crea (Lidocaine-prilocaine) 14)  Plaquenil 200 Mg Tabs (Hydroxychloroquine sulfate) .[name].. 1 by mouth two times a day 15)  Nifedipine 60 Mg Tb24  (Nifedipine) .[name].. 1 by mouth two times a day 16)  Nitroglycerin 0.4 Mg Subl (Nitroglycerin) .... As needed 17)  Vicodin 5-500 Mg Tabs (Hydrocodone-acetaminophen) .... Every 4 hours as needed 18)  Zantac 150 Mg Tabs (Ranitidine hcl) .[name].. 1 by mouth as needed 19)  Metoclopramide Hcl 10 Mg Tabs (Metoclopramide hcl) .[name].. 1 by mouth every 6 hours as needed for headache 20)  Klor-con M20 20 Meq Tbcr (Potassium chloride crys cr) .[name].. 1 by mouth once daily   Patient Instructions: 1)  Please schedule a follow-up appointment in 6 weeks.    ]

## 2010-03-31 NOTE — Letter (Signed)
Summary: hypertension care flow sheet  hypertension care flow sheet   Imported By: Curtis Sites 12/29/2007 11:17:56  _____________________________________________________________________  External Attachment:    Type:   Image     Comment:   External Document

## 2010-03-31 NOTE — Assessment & Plan Note (Signed)
Summary: Weight loss/SLE/HTN/jport flush   Vital Signs:  Patient Profile:   45 Years Old Female Height:     64 inches (162.56 cm) Weight:      148.50 pounds BMI:     25.58 O2 Sat:      97 % O2 treatment:    Room Air Pulse rate:   69 / minute Resp:     10 per minute BP sitting:   186 / 122  (right arm)  Vitals Entered By: Lutricia Horsfall LPN (March 28, 2008 9:16 AM)                Left chest portacath flushed without difficulty using sterile technique.  Positive blood return.  Flushed with 10cc heparin.  Pt. tolerated well.  Lutricia Horsfall LPN  March 28, 2008 10:52 AM    Serial Vital Signs/Assessments:  Time      Position  BP       Pulse  Resp  Temp     By 9:38 AM             170/112                        Lutricia Horsfall LPN   Chief Complaint:  followup.  History of Present Illness: Here for routine follow up.  She is very anxious this morning but says she took all of her medications this morning.  She says she has an appointment with Dr. Phylliss Bob but not until June or July.  She says she doesn't need follow up with Dr. Karilyn Cota.  She says except for her hands she is feeling well.  She denies any nausea, vomiting, diarrhea or constipation.  She says she has chest congestion and an occasional cough, but no shortness of breath or chest pain.  She says she is eating well and not trying to lose weight.  She says she drinks a beer only every once in a while.    Current Allergies: No known allergies   Past Medical History:    Allergic rhinitis    Asthma/Lupus lung    Depression    GERD    Hypertension    IBS    overactive bladder    Lupus with nephritis, Raynaud's, lung disease (Dr. Phylliss Bob)    gout    migraines    TMJ disorder    Elevated liver enzymes--biopsy with hepatic steatosis      Physical Exam  General:     alert, well-developed, and well-nourished.      Impression & Recommendations:  Problem # 1:  WEIGHT LOSS (ICD-783.21) This has been slow and  progressive for about 3 years and she has no real complaint to pin point were to look.  She is having some chest symptoms so will get a CXR for further evaluation along with some labs.   Orders: CXR- 2view (CXR) T-TSH ([id number]) T-T4, Free [phone]) T-CBC w/Diff [phone]) T-Comprehensive Metabolic Panel [phone])   Problem # 2:  SYSTEMIC LUPUS ERYTHEMATOSUS (ICD-710.0) She does not have follow up with Dr. Phylliss Bob for a while so we will get ESR and CRP along with other labs and forward those to his office.  Her updated medication list for this problem includes:    Prednisone 5 Mg Tabs (Prednisone) ..... Once daily    Plaquenil 200 Mg Tabs (Hydroxychloroquine sulfate) .[name]... 1 by mouth two times a day  Orders: CXR- 2view (CXR) T-CBC w/Diff ([id number]) T-Comprehensive Metabolic Panel ([id number]) T-C-Reactive Protein [phone]) T-Sed  Rate (Automated) [phone])   Problem # 3:  HYPERTENSION (ICD-401.9) BP higher than usual but she is more anxious than usual.  Will follow. Her updated medication list for this problem includes:    Lotensin 40 Mg Tabs (Benazepril hcl) ..... Once daily    Aldactone 25 Mg Tabs (Spironolactone) .[name]... 1 by mouth two times a day    Lasix 80 Mg Tabs (Furosemide) .[name]... 1 by mouth two times a day    Hydralazine Hcl 100 Mg Tabs (Hydralazine hcl) .[name]... 1 by mouth three times a day    Amlodipine Besylate 10 Mg Tabs (Amlodipine besylate) .[name]... 1 by mouth once daily    Felodipine 10 Mg Xr24h-tab (Felodipine) .[name]... 1 by mouth once daily   Problem # 4:  FITTING/ADJUSTMENT, VASCULAR CATHETER (ICD-V58.81)  Orders: Port-a-Cath Flush (02542)   Complete Medication List: 1)  Lotensin 40 Mg Tabs (Benazepril hcl) .... Once daily 2)  Aldactone 25 Mg Tabs (Spironolactone) .[name].. 1 by mouth two times a day 3)  Lasix 80 Mg Tabs (Furosemide) .[name].. 1 by mouth two times a day 4)  Hydralazine Hcl 100 Mg Tabs (Hydralazine hcl) .[name].. 1 by mouth three times a  day 5)  Flexeril 10 Mg Tabs (Cyclobenzaprine hcl) .... Three times a day 6)  Colchicine 0.6 Mg Tabs (Colchicine) .... Two times a day 7)  Prednisone 5 Mg Tabs (Prednisone) .... Once daily 8)  Allopurinol 100 Mg Tabs (Allopurinol) .... Once daily 9)  Fexofenadine Hcl 180 Mg Tabs (Fexofenadine hcl) .... Once daily 10)  Topamax 100 Mg Tabs (Topiramate) .[name].. 1 by mouth at bedtime 11)  Nasonex Susp (Mometasone furoate susp) 12)  Emla 2.5-2.5 % Crea (Lidocaine-prilocaine) 13)  Plaquenil 200 Mg Tabs (Hydroxychloroquine sulfate) .[name].. 1 by mouth two times a day 14)  Nitroglycerin 0.4 Mg Subl (Nitroglycerin) .... As needed 15)  Vicodin 5-500 Mg Tabs (Hydrocodone-acetaminophen) .... Every 4 hours as needed 16)  Zantac 150 Mg Tabs (Ranitidine hcl) .[name].. 1 by mouth as needed 17)  Metoclopramide Hcl 10 Mg Tabs (Metoclopramide hcl) .[name].. 1 by mouth every 6 hours as needed for headache 18)  Klor-con M20 20 Meq Tbcr (Potassium chloride crys cr) .[name].. 1 by mouth once daily 19)  Amlodipine Besylate 10 Mg Tabs (Amlodipine besylate) .[name].. 1 by mouth once daily 20)  Felodipine 10 Mg Xr24h-tab (Felodipine) .[name].. 1 by mouth once daily 21)  Omeprazole 20 Mg Cpdr (Omeprazole) .[name].. 1 by mouth two times a day

## 2010-03-31 NOTE — Letter (Signed)
[facility]   Imported By: Curtis Sites 01/15/2008 08:59:48  _____________________________________________________________________  External Attachment:    Type:   Image     Comment:   External Document

## 2010-03-31 NOTE — Assessment & Plan Note (Signed)
Summary: SLE/port flush/flu & H1N1 shots   Vital Signs:  Patient Profile:   45 Years Old Female Height:     64 inches (162.56 cm) Pulse rate:   64 / minute Resp:     10 per minute BP sitting:   136 / 88  (right arm)  Vitals Entered By: Lutricia Horsfall (December 28, 2007 10:33 AM)                 Chief Complaint:  followup.  History of Present Illness: Patient presents today for routine follow up.  She has seen Dr. Karilyn Cota and had a liver biopsy and she says she was told that everything was good.  She says she is supposed to follow up in 3 months.  SHe says her last appt with Dr. Phylliss Bob was about 4 months ago but she doesn't have a follow up scheduled.    She has not had her flu shot yet.    Current Allergies: No known allergies   Past Medical History:    Reviewed history from 03/28/2007 and no changes required:       Allergic rhinitis       Asthma/Lupus lung       Depression       GERD       Hypertension       IBS       overactive bladder       Lupus with nephritis, Raynaud's, lung disease (Dr. Phylliss Bob)       gout       migraines       TMJ disorder      Physical Exam  General:     alert, well-developed, and well-nourished.     Left chest portacath flushed without difficulty using sterile technique.  Positive blood return.  Flushed with 10cc heparin.  Pt. tolerated well.  Sherrie Gardner  December 28, 2007 11:11 AM   Impression & Recommendations:  Problem # 1:  SYSTEMIC LUPUS ERYTHEMATOSUS (ICD-710.0) She is doing well.  We will find out when Dr. Phylliss Bob wants her back.  We will also work on getting results of her liver biopsy from Dr. Karilyn Cota.  We are going to give her the H1N1 and seasonal flu shots today.    Her updated medication list for this problem includes:    Prednisone 5 Mg Tabs (Prednisone) ..... Once daily    Plaquenil 200 Mg Tabs (Hydroxychloroquine sulfate) .[name]... 1 by mouth two times a day   Problem # 2:  FITTING/ADJUSTMENT, VASCULAR CATHETER  (ICD-V58.81)  Orders: Port-a-Cath Flush (04540)   Complete Medication List: 1)  Lotensin 40 Mg Tabs (Benazepril hcl) .... Once daily 2)  Aldactone 25 Mg Tabs (Spironolactone) .[name].. 1 by mouth two times a day 3)  Lasix 80 Mg Tabs (Furosemide) .[name].. 1 by mouth two times a day 4)  Hydralazine Hcl 100 Mg Tabs (Hydralazine hcl) .[name].. 1 by mouth three times a day 5)  Flexeril 10 Mg Tabs (Cyclobenzaprine hcl) .... Three times a day 6)  Colchicine 0.6 Mg Tabs (Colchicine) .... Two times a day 7)  Prednisone 5 Mg Tabs (Prednisone) .... Once daily 8)  Allopurinol 100 Mg Tabs (Allopurinol) .... Once daily 9)  Fexofenadine Hcl 180 Mg Tabs (Fexofenadine hcl) .... Once daily 10)  Topamax 100 Mg Tabs (Topiramate) .[name].. 1 by mouth at bedtime 11)  Nasonex Susp (Mometasone furoate susp) 12)  Emla 2.5-2.5 % Crea (Lidocaine-prilocaine) 13)  Plaquenil 200 Mg Tabs (Hydroxychloroquine sulfate) .[name].. 1 by mouth two times  a day 14)  Nitroglycerin 0.4 Mg Subl (Nitroglycerin) .... As needed 15)  Vicodin 5-500 Mg Tabs (Hydrocodone-acetaminophen) .... Every 4 hours as needed 16)  Zantac 150 Mg Tabs (Ranitidine hcl) .[name].. 1 by mouth as needed 17)  Metoclopramide Hcl 10 Mg Tabs (Metoclopramide hcl) .[name].. 1 by mouth every 6 hours as needed for headache 18)  Klor-con M20 20 Meq Tbcr (Potassium chloride crys cr) .[name].. 1 by mouth once daily 19)  Amlodipine Besylate 10 Mg Tabs (Amlodipine besylate) .[name].. 1 by mouth once daily 20)  Felodipine 10 Mg Xr24h-tab (Felodipine) .[name].. 1 by mouth once daily 21)  Omeprazole 20 Mg Cpdr (Omeprazole) .[name].. 1 by mouth two times a day  Other Orders: Influenza Vaccine MCR (11914) Influenza A (H1N1) w/ Phy couseling (N8295)   Patient Instructions: 1)  Please schedule a follow-up appointment in 6 weeks.   ] H1N1 given.  Lutricia Horsfall  December 28, 2007 11:09 AM  Influenza Vaccine    Vaccine Type: Fluvax MCR    Site: right deltoid    Mfr: GlaxoSmithK[employer]    Dose: 0.5 ml    Route: IM     Given by: Lutricia Horsfall    Exp. Date: 08/28/2008    Lot #: [id number]    VIS given: 09/22/06 version given December 28, 2007.  Flu Vaccine Consent Questions    Do you have a history of severe allergic reactions to this vaccine? no    Any prior history of allergic reactions to egg and/or gelatin? no    Do you have a sensitivity to the preservative Thimersol? no    Do you have a past history of Guillan-Barre Syndrome? no    Do you currently have an acute febrile illness? no    Have you ever had a severe reaction to latex? no    Vaccine information given and explained to patient? yes    Are you currently pregnant? no

## 2010-03-31 NOTE — Assessment & Plan Note (Signed)
Summary: HTN/SLE   Vital Signs:  Patient Profile:   45 Years Old Female Height:     64 inches (162.56 cm) Weight:      161 pounds BMI:     27.74 Pulse rate:   72 / minute Resp:     8 per minute BP sitting:   122 / 82  (right arm)  Vitals Entered By: Lutricia Horsfall (May 25, 2007 10:29 AM)                 Chief Complaint:  followup BP.  History of Present Illness: Here for 2 day follow up.  She says she has been eating and drinking and had a bowl of cereal for breakfast this morning.  She is feeling fine today.  She says her blood pressures are better now than in the past because she is taking her medications.  Her last eye exam was 1 year ago at [facility] in [place].  She is noting some trouble with red injected eyes.    Current Allergies: No known allergies   Past Medical History:    Reviewed history from 03/28/2007 and no changes required:       Allergic rhinitis       Asthma/Lupus lung       Depression       GERD       Hypertension       IBS       overactive bladder       Lupus with nephritis, Raynaud's, lung disease (Dr. Phylliss Bob)       gout       migraines       TMJ disorder      Physical Exam  General:     alert and overweight-appearing.   Eyes:     conjunctival injection.  EOMI, PERRL, sclera anicteric     Impression & Recommendations:  Problem # 1:  HYPERTENSION (ICD-401.9) It sounds as though she is finally being compliant with her medications.  I do want her on a calcium channel blocker given the severity of her Raynaud's, but am concerned that she may become hypotensive.  She will check daily blood pressures and bring them to her appointment with Dr. Lowell Guitar on 14 April and follow up with me in 6 weeks.  Hopefully we will be able to back off on the hydralazine. Her updated medication list for this problem includes:    Lotensin 40 Mg Tabs (Benazepril hcl) ..... Once daily    Aldactone 25 Mg Tabs (Spironolactone) .[name]... 1 by mouth two  times a day    Lasix 80 Mg Tabs (Furosemide) .[name]... 1 by mouth two times a day    Hydralazine Hcl 100 Mg Tabs (Hydralazine hcl) .[name]... 1 by mouth three times a day    Amlodipine Besylate 10 Mg Tabs (Amlodipine besylate) .[name]... 1 by mouth once daily   Problem # 2:  SYSTEMIC LUPUS ERYTHEMATOSUS (ICD-710.0) She does need her eye exam being on plaquenil but I doubt the plaquenil is responsible for the conjunctival injection.   Her updated medication list for this problem includes:    Prednisone 5 Mg Tabs (Prednisone) ..... Once daily    Plaquenil 200 Mg Tabs (Hydroxychloroquine sulfate) .[name]... 1 by mouth two times a day  Orders: Ophthalmology Referral (Ophthalmology)   Complete Medication List: 1)  Lotensin 40 Mg Tabs (Benazepril hcl) .... Once daily 2)  Aldactone 25 Mg Tabs (Spironolactone) .[name].. 1 by mouth two times a day 3)  Lasix  80 Mg Tabs (Furosemide) .[name].. 1 by mouth two times a day 4)  Hydralazine Hcl 100 Mg Tabs (Hydralazine hcl) .[name].. 1 by mouth three times a day 5)  Flexeril 10 Mg Tabs (Cyclobenzaprine hcl) .... Three times a day 6)  Colchicine 0.6 Mg Tabs (Colchicine) .... Two times a day 7)  Prednisone 5 Mg Tabs (Prednisone) .... Once daily 8)  Allopurinol 100 Mg Tabs (Allopurinol) .... Once daily 9)  Fexofenadine Hcl 180 Mg Tabs (Fexofenadine hcl) .... Once daily 10)  Topamax 100 Mg Tabs (Topiramate) .[name].. 1 by mouth at bedtime 11)  Nasonex Susp (Mometasone furoate susp) 12)  Emla 2.5-2.5 % Crea (Lidocaine-prilocaine) 13)  Plaquenil 200 Mg Tabs (Hydroxychloroquine sulfate) .[name].. 1 by mouth two times a day 14)  Nitroglycerin 0.4 Mg Subl (Nitroglycerin) .... As needed 15)  Vicodin 5-500 Mg Tabs (Hydrocodone-acetaminophen) .... Every 4 hours as needed 16)  Zantac 150 Mg Tabs (Ranitidine hcl) .[name].. 1 by mouth as needed 17)  Metoclopramide Hcl 10 Mg Tabs (Metoclopramide hcl) .[name].. 1 by mouth every 6 hours as needed for headache 18)  Klor-con M20 20 Meq Tbcr (Potassium chloride crys cr)  .[name].. 1 by mouth once daily 19)  Amlodipine Besylate 10 Mg Tabs (Amlodipine besylate) .[name].. 1 by mouth once daily   Patient Instructions: 1)  Please schedule a follow-up appointment in 6 weeks.    Prescriptions: VICODIN 5-500 MG TABS (HYDROCODONE-ACETAMINOPHEN) every 4 hours as needed  #60 x 2   Entered and Authorized by:   Erle Crocker MD   Signed by:   Erle Crocker MD on 05/25/2007   Method used:   Print then Give to Patient   RxID:   [id number]  ]

## 2010-03-31 NOTE — Letter (Signed)
Summary: [facility] office notes  [facility] office notes   Imported By: Donneta Romberg 06/06/2007 15:20:16  _____________________________________________________________________  External Attachment:    Type:   Image     Comment:   External Document

## 2010-03-31 NOTE — Letter (Signed)
Summary: [facility]&p  h&p   Imported By: Altamease Oiler 11/13/2008 13:54:32  _____________________________________________________________________  External Attachment:    Type:   Image     Comment:   External Document

## 2010-03-31 NOTE — Assessment & Plan Note (Signed)
Summary: HTN/SLE/port flush   Vital Signs:  Patient Profile:   45 Years Old Female Height:     64 inches (162.56 cm) Pulse rate:   92 / minute Resp:     8 per minute BP sitting:   140 / 98  (right arm)               Chief Complaint:  followup.  History of Present Illness: [name] presents today for follow-up of her blood pressure.  She says she is feeling very well.  She is back on her Plendil and is having less pain in her hands also.  She is due for her Port-A-Cath to be flushed.  A follow-up scheduled at the end of next month with Dr. Phylliss Bob.  Dr. Phylliss Bob had mentioned if her liver enzymes remain elevated that he may want her to have a gastroenterology appointment.  Current Allergies: No known allergies   Past Medical History:    Reviewed history from 08/16/2006 and no changes required:       Allergic rhinitis       Asthma/Lupus lung       Depression       GERD       Hypertension       IBS       overactive bladder       Lupus with nephritis, Raynaud's, lung diaease (Dr. Phylliss Bob)       gout       migraines       TMJ disorder  Past Surgical History:    Reviewed history from 05/24/2006 and no changes required:       PORT PLACEMENT       right hand-2003       bilat knees-4 years ago    Family History:    Reviewed history from 05/24/2006 and no changes required:       father-67-DM       mother-64-THA       sisters x2       borhter x1       son-23       daughter-25  Social History:    Reviewed history from 05/24/2006 and no changes required:       Single lives alone       Never Smoked       Alcohol use-yes-0-2 drinks, <monthly       Drug use-yes-marijuana socially    Review of Systems      See HPI   Physical Exam  General:     alert and overweight-appearing.      Impression & Recommendations:  Problem # 1:  HYPERTENSION (ICD-401.9) her blood pressure is much better being back on her medications. Her updated medication list for this problem  includes:    Lotensin 40 Mg Tabs (Benazepril hcl) ..... Once daily    Aldactone 25 Mg Tabs (Spironolactone) .[name]... D    Lasix 80 Mg Tabs (Furosemide) ..... Once daily    Hydralazine Hcl 100 Mg Tabs (Hydralazine hcl) .[name]... 1 by mouth three times a day    Plendil 10 Mg Tb24 (Felodipine) ..... Once daily    Nifedipine 60 Mg Tb24 (Nifedipine) .[name]... 1 by mouth two times a day  Orders: T-Comprehensive Metabolic Panel ([id number])   Problem # 2:  SYSTEMIC LUPUS ERYTHEMATOSUS (ICD-710.0) we are going to get some blood work today including liver enzymes for further evaluation.  Will forward copies of these to Dr. Phylliss Bob.  If her liver enzymes remain elevated we will  get her referred to gastroenterology. Her updated medication list for this problem includes:    Prednisone 5 Mg Tabs (Prednisone) ..... Once daily    Plaquenil 200 Mg Tabs (Hydroxychloroquine sulfate) .[name]... 1 by mouth two times a day  Orders: T-Comprehensive Metabolic Panel [phone]) T-CBC w/Diff ([id number]) T-C-Reactive Protein [id number] T-Sed Rate (Automated) ([id number])   Problem # 3:  FITTING/ADJUSTMENT, VASCULAR CATHETER (ICD-V58.81) her Port-A-Cath was flushed today. Orders: Port-a-Cath Flush ([id number])  Left chest portacath flushed without difficulty using sterile technique.  Flushed with 10cc heparin.  Pt. tolerated well. .................................................................Marland KitchenMarland KitchenSherrie Gardner  September 27, 2006 12:13 PM

## 2010-03-31 NOTE — Letter (Signed)
Summary: visit list  visit list   Imported By: Altamease Oiler 11/13/2008 13:56:35  _____________________________________________________________________  External Attachment:    Type:   Image     Comment:   External Document

## 2010-03-31 NOTE — Medication Information (Signed)
Summary: polypharmacy program  polypharmacy program   Imported By: Curtis Sites 10/11/2008 13:51:33  _____________________________________________________________________  External Attachment:    Type:   Image     Comment:   External Document

## 2010-03-31 NOTE — Assessment & Plan Note (Signed)
Summary: HTN/port flush/flu shot   Vital Signs:  Patient Profile:   45 Years Old Female Height:     64 inches (162.56 cm) O2 Sat:      92 % Pulse rate:   64 / minute Resp:     8 per minute BP sitting:   158 / 118  (right arm)  Vitals Entered By: Lutricia Horsfall (December 20, 2006 11:22 AM) Oxygen therapy Room Air                 Chief Complaint:  followup.  History of Present Illness: Here for routine follow up. Saw Dr. Phylliss Bob 17 September and blood pressure was very high.  He had her take and additional lasix and it came down.  He increased her lasix to two times a day and made no other changes in her medication.  She says her blood pressures at home are OK.    She is agreeable to a flu shot and needs her port flushed.   Current Allergies: No known allergies   Past Medical History:    Reviewed history from 08/16/2006 and no changes required:       Allergic rhinitis       Asthma/Lupus lung       Depression       GERD       Hypertension       IBS       overactive bladder       Lupus with nephritis, Raynaud's, lung diaease (Dr. Phylliss Bob)       gout       migraines       TMJ disorder      Physical Exam  General:     Obese, in no acute distress. Alert and oriented X 3.     Impression & Recommendations:  Problem # 1:  HYPERTENSION (ICD-401.9) Need records from Dr. Renaldo Reel office as well as copies of labs.  Her updated medication list for this problem includes:    Lotensin 40 Mg Tabs (Benazepril hcl) ..... Once daily    Aldactone 25 Mg Tabs (Spironolactone) .[name]... 1 by mouth two times a day    Lasix 80 Mg Tabs (Furosemide) .[name]... 1 by mouth two times a day    Hydralazine Hcl 100 Mg Tabs (Hydralazine hcl) .[name]... 1 by mouth three times a day    Plendil 10 Mg Tb24 (Felodipine) ..... Once daily    Nifedipine 60 Mg Tb24 (Nifedipine) .[name]... 1 by mouth two times a day   Problem # 2:  FITTING/ADJUSTMENT, VASCULAR CATHETER (ICD-V58.81)  Orders: Port-a-Cath Flush  ([id number])   Complete Medication List: 1)  Lotensin 40 Mg Tabs (Benazepril hcl) .... Once daily 2)  Aldactone 25 Mg Tabs (Spironolactone) .[name].. 1 by mouth two times a day 3)  Lasix 80 Mg Tabs (Furosemide) .[name].. 1 by mouth two times a day 4)  Hydralazine Hcl 100 Mg Tabs (Hydralazine hcl) .[name].. 1 by mouth three times a day 5)  Flexeril 10 Mg Tabs (Cyclobenzaprine hcl) .... Three times a day 6)  Colchicine 0.6 Mg Tabs (Colchicine) .... Two times a day 7)  Plendil 10 Mg Tb24 (Felodipine) .... Once daily 8)  Prednisone 5 Mg Tabs (Prednisone) .... Once daily 9)  Allopurinol 100 Mg Tabs (Allopurinol) .... Once daily 10)  Fexofenadine Hcl 180 Mg Tabs (Fexofenadine hcl) .... Once daily 11)  Topamax 100 Mg Tabs (Topiramate) .[name].. 1 by mouth at bedtime 12)  Nasonex Susp (Mometasone furoate susp) 13)  Emla 2.5-2.5 %  Crea (Lidocaine-prilocaine) 14)  Plaquenil 200 Mg Tabs (Hydroxychloroquine sulfate) .[name].. 1 by mouth two times a day 15)  Nifedipine 60 Mg Tb24 (Nifedipine) .[name].. 1 by mouth two times a day 16)  Nitroglycerin 0.4 Mg Subl (Nitroglycerin) .... As needed 17)  Vicodin 5-500 Mg Tabs (Hydrocodone-acetaminophen) .... Every 4 hours as needed 18)  Zantac 150 Mg Tabs (Ranitidine hcl) .[name].. 1 by mouth as needed 19)  Metoclopramide Hcl 10 Mg Tabs (Metoclopramide hcl) .[name].. 1 by mouth every 6 hours as needed for headache  Other Orders: Influenza Vaccine MCR (23762)  Left chest portacath flushedwithout difficulty using sterile technique.  Negative blood return.  Flushed with 10cc heparin.  Pt. tolerated well. .................................................................Marland KitchenMarland KitchenLutricia Horsfall  December 20, 2006 12:05 PM    Prescriptions: LASIX 80 MG TABS (FUROSEMIDE) 1 by mouth two times a day  #60 x 5   Entered and Authorized by:   Erle Crocker MD   Signed by:   Erle Crocker MD on 12/20/2006   Method used:   Electronically sent to ...       [facility] [address]       [address]       [place], [place]  [address]       Ph: [phone] or [phone]       Fax: [phone]   RxID:   [id number]  ]  Influenza Vaccine    Vaccine Type: Fluvax MCR    Site: right deltoid    Mfr: novartis    Dose: 0.5 ml    Route: IM    Given by: Lutricia Horsfall    Exp. Date: 5/09    Lot #: [id number]    VIS given: 08/28/04 version given December 20, 2006.

## 2010-03-31 NOTE — Progress Notes (Signed)
----   Converted from flag ---- ---- 12/20/2006 11:58 AM, Erle Crocker MD wrote: Need last office note and labs from September from Dr. Renaldo Reel office. ------------------------------  Message left with Misty Stanley to fax Medical record.  .................................................................Marland KitchenMarland KitchenSherrie Gardner  December 20, 2006 1:16 PM

## 2010-03-31 NOTE — Letter (Signed)
Summary: Disability Form  Disability Form   Imported By: Magdalene River 06/08/2006 10:59:08  _____________________________________________________________________  External Attachment:    Type:   Image     Comment:   External Document

## 2010-03-31 NOTE — Progress Notes (Signed)
Summary: GI referral  Phone Note Other Incoming   Call placed by: Sonny Dandy,  September 29, 2006 1:26 PM Summary of Call: patient called and advised that Dr Karilyn Cota office will call with referrral date/time. records faxed Initial call taken by: Sonny Dandy,  September 29, 2006 1:27 PM

## 2010-03-31 NOTE — Assessment & Plan Note (Signed)
Summary: HTN   Vital Signs:  Patient Profile:   45 Years Old Female Height:     64 inches (162.56 cm) O2 Sat:      95 % Pulse rate:   72 / minute Resp:     8 per minute BP sitting:   154 / 104  (right arm)  Pt. in pain?   no  Vitals Entered By: Lutricia Horsfall (May 25, 2006 9:54 AM) Oxygen therapy Room Air                Chief Complaint:  followup lupus/port flush.  History of Present Illness: Here for routine f/u.  Notes that she is feeling OK but over the past 2 days she has had headache in frontal area and aching in bones.  Does not feel like migraine.  Notes she keeps a headache most of the time.  Her last dental visit was about 2 years ago.  She denies nasal drainage, post nasal drip, visual changes.   Home BP checks 140's/100-120's.  Her clonidine is causing some fatigue at the current dose.  Due for port flush.  Prior Medications: LOTENSIN 40 MG TABS (BENAZEPRIL HCL) once daily ALDACTONE 25 MG TABS (SPIRONOLACTONE) D LASIX 80 MG TABS (FUROSEMIDE) once daily FLEXERIL 10 MG TABS (CYCLOBENZAPRINE HCL) three times a day COLCHICINE 0.6 MG TABS (COLCHICINE) two times a day PLENDIL 10 MG TB24 (FELODIPINE) once daily PREDNISONE 5 MG TABS (PREDNISONE) once daily ALLOPURINOL 100 MG TABS (ALLOPURINOL) once daily FEXOFENADINE HCL 180 MG TABS (FEXOFENADINE HCL) once daily TOPAMAX 100 MG TABS (TOPIRAMATE) 1 by mouth at bedtime NASONEX  SUSP (MOMETASONE FUROATE SUSP)  EMLA 2.5-2.5 % CREA (LIDOCAINE-PRILOCAINE)  PLAQUENIL 200 MG TABS (HYDROXYCHLOROQUINE SULFATE) 1 by mouth two times a day NIFEDIPINE 60 MG TB24 (NIFEDIPINE) 1 by mouth two times a day NITROGLYCERIN 0.4 MG SUBL (NITROGLYCERIN) as needed VICODIN 5-500 MG TABS (HYDROCODONE-ACETAMINOPHEN) every 4 hours as needed ZANTAC 150 MG TABS (RANITIDINE HCL) 1 by mouth as needed METOCLOPRAMIDE HCL 10 MG TABS (METOCLOPRAMIDE HCL) 1 by mouth as needed Current Allergies: No known allergies   Past Medical History:  Allergic rhinitis    Asthma/Lupus lung    Depression    GERD    Hypertension    IBS    overactive bladder    Lupus with nephritis, Raynaud's, lung diaease    gout    migraines    TMJ disorder   Family History:    Reviewed history from 05/24/2006 and no changes required:       father-67-DM       mother-64-THA       sisters x2       borhter x1       son-23       daughter-25  Social History:    Reviewed history from 05/24/2006 and no changes required:       Single lives alone       Never Smoked       Alcohol use-yes-0-2 drinks, <monthly       Drug use-yes-marijuana socially    Review of Systems      See HPI   Physical Exam  General:     overweight-appearing, in no acute distress.    Head:     normocephalic and atraumatic.  +ttp over b/l temporal area down to TMJ, with obvious dislocation of R TMJ on opening mouth widely.    Impression & Recommendations:  Problem # 1:  HYPERTENSION (ICD-401.9) The patient's blood pressure is not  controlled at this time and medications adjustment is necessary.   Patient is instructed to document blood pressures and bring them to the next appointment in 4 weeks. D/c clonidine and start hydralazine 50mg  three times a day to see if she tolerates this better.  Her updated medication list for this problem includes:    Lotensin 40 Mg Tabs (Benazepril hcl) ..... Once daily    Aldactone 25 Mg Tabs (Spironolactone) .[name]... D    Lasix 80 Mg Tabs (Furosemide) ..... Once daily    Hydralazine Hcl 50 Mg Tabs (Hydralazine hcl) .[name]... 1 by mouth three times a day    Plendil 10 Mg Tb24 (Felodipine) ..... Once daily    Nifedipine 60 Mg Tb24 (Nifedipine) .[name]... 1 by mouth two times a day   Problem # 2:  HEADACHE (ICD-784.0) I wonder if this is from her TMJ disorder and recommended dental evaluation to see if this may be the case.    Her updated medication list for this problem includes:    Vicodin 5-500 Mg Tabs (Hydrocodone-acetaminophen) .....  Every 4 hours as needed   Problem # 3:  FITTING/ADJUSTMENT, VASCULAR CATHETER (ICD-V58.81) Port-a-cath flushed. Orders: Port-a-Cath Flush (31517)   Medications Added to Medication List This Visit: 1)  Hydralazine Hcl 50 Mg Tabs (Hydralazine hcl) .[name].. 1 by mouth three times a day  Left chest portacath flushed without difficulty using sterile technique.  Negative blood return.  Flushed with 10cc heparin.  Pt. tolerated well. .................................................................Marland KitchenMarland KitchenLutricia Horsfall  May 25, 2006 2:47 PM   Patient Instructions: 1)  Please schedule a follow-up appointment in 1 month.

## 2010-03-31 NOTE — Progress Notes (Signed)
Summary: please advise  Phone Note Call from Patient   Summary of Call: Dr. Estill Bakes office called and needs a copy of her liver evaluation and biopsy  that she had done.  fax to [phone] Initial call taken by: Curtis Sites,  August 09, 2008 9:00 AM  Follow-up for Phone Call        requested info faxed. Follow-up by: Harlene Salts,  August 09, 2008 10:15 AM

## 2010-03-31 NOTE — Assessment & Plan Note (Signed)
Summary: hypotension/depressed mood/SLE/port flush   Vital Signs:  Patient Profile:   45 Years Old Female Height:     64 inches (162.56 cm) Pulse rate:   92 / minute Resp:     8 per minute BP sitting:   98 / 64  (right arm) BP standing:   74 / 52  Vitals Entered By: Lutricia Horsfall (May 23, 2007 10:38 AM)                 Serial Vital Signs/Assessments:  Time      Position  BP       Pulse  Resp  Temp     By 11:32 AM  Lying RA  84/64                          Lutricia Horsfall 11:32 AM  Sitting   74/56                          Lutricia Horsfall 11:32 AM  Standing  74/52                          Lutricia Horsfall   Chief Complaint:  followup.  History of Present Illness: Here for routine follow up.  She saw Dr. Phylliss Bob 2 weeks ago and Dr. Lowell Guitar of nephrology yesterday.  She says her blood pressure was 145/110.  He wrote her a prescription for Amlodipine 10 mg which she hasn't gotten filled.  She requested refills of all of her medications from [facility] on 05/03/07 but she does not have the felodipine or nifedipine in her medication bag today and apparently these were not refilled.  She is scheduled to see Dr. Lowell Guitar again on 14 April.  Her appointment with Dr. Karilyn Cota has been postponed until 21 April.  She says she stopped taking her medications and was drinking and ended up at [facility] from 3-5 March for EtOH intoxication and greater elevation in her liver enzymes.  She is staying with her sister and is feeling better.  She says she was feeling depressed but that is improving.   After taking her medications this morning, she had some lightheadedness and chest pain.  She has not gotten the prescription for amlodipine filled yet.  She has not had anything to eat yet today and only had milk with her medications.  She says she has lost weight recently and has not been eating much.    She has an "Attending Physician's Statement" from [employer] for her disability  insurance for me to complete.      Current Allergies: No known allergies   Past Medical History:    Reviewed history from 03/28/2007 and no changes required:       Allergic rhinitis       Asthma/Lupus lung       Depression       GERD       Hypertension       IBS       overactive bladder       Lupus with nephritis, Raynaud's, lung disease (Dr. Phylliss Bob)       gout       migraines       TMJ disorder  Past Surgical History:    Reviewed history from 05/24/2006 and no changes required:       PORT PLACEMENT       right  hand-2003       bilat knees-4 years ago    Family History:    Reviewed history from 02/06/2007 and no changes required:       father-70--deceased from colon cancer, DM       mother-64-THA       sisters x2--healthy       sister x1 deceased from GSW       brother x1       brother x1 deceased from complications of a staph infection       son-24--healthy       daughter-25--healthy  Social History:    Reviewed history from 05/24/2006 and no changes required:       Single lives alone       Never Smoked       Alcohol use-yes-0-2 drinks, <monthly       Drug use-yes-marijuana socially    Review of Systems      See HPI   Physical Exam  General:     alert, well-developed, and well-nourished.   Lungs:     Clear to auscultation bilaterally with good air movement, normal expansion.  Heart:     Normal S1 and S2. No murmurs or extracardiac sounds. PMI is nondisplaced.  Extremities:     1+ left pedal edema and 1+ right pedal edema.   Psych:     Oriented X3, good eye contact, and subdued.      Impression & Recommendations:  Problem # 1:  HYPOTENSION, ORTHOSTATIC (ICD-458.0) With her weight loss and poor by mouth intake, she is actually hypotensive today.  I have asked her to not start the amlodipine yet, eat 3 meals per day and drink plenty of water and follow up in 2 days.   Orders: Hgb (95621) EKG w/ Interpretation (93000)   Problem # 2:   FITTING/ADJUSTMENT, VASCULAR CATHETER (ICD-V58.81) Port-a-cath flushed today.   Orders: Port-a-Cath Flush ([id number])   Problem # 3:  ADJUSTMENT DISORDER WITH DEPRESSED MOOD (ICD-309.0) I am concerned that she may have a chronic depression due to all of her health issues.  She says she is feeling better, but we need to watch her closely.  I asked her to call us if she feels the need to stop taking her medications and start using alcohol to self medicate again.    Problem # 4:  SYSTEMIC LUPUS ERYTHEMATOSUS (ICD-710.0) Paperwork completed for her disability. Medication list for this problem includes:    Prednisone 5 Mg Tabs (Prednisone) ..... Once daily    Plaquenil 200 Mg Tabs (Hydroxychloroquine sulfate) .[name]... 1 by mouth two times a day  Her updated medication list for this problem includes:    Prednisone 5 Mg Tabs (Prednisone) ..... Once daily    Plaquenil 200 Mg Tabs (Hydroxychloroquine sulfate) .[name]... 1 by mouth two times a day   Complete Medication List: 1)  Lotensin 40 Mg Tabs (Benazepril hcl) .... Once daily 2)  Aldactone 25 Mg Tabs (Spironolactone) .[name].. 1 by mouth two times a day 3)  Lasix 80 Mg Tabs (Furosemide) .[name].. 1 by mouth two times a day 4)  Hydralazine Hcl 100 Mg Tabs (Hydralazine hcl) .[name].. 1 by mouth three times a day 5)  Flexeril 10 Mg Tabs (Cyclobenzaprine hcl) .... Three times a day 6)  Colchicine 0.6 Mg Tabs (Colchicine) .... Two times a day 7)  Prednisone 5 Mg Tabs (Prednisone) .... Once daily 8)  Allopurinol 100 Mg Tabs (Allopurinol) .... Once daily 9)  Fexofenadine Hcl 180 Mg Tabs (Fexofenadine hcl) .Marland KitchenMarland KitchenMarland Kitchen  Once daily 10)  Topamax 100 Mg Tabs (Topiramate) .[name].. 1 by mouth at bedtime 11)  Nasonex Susp (Mometasone furoate susp) 12)  Emla 2.5-2.5 % Crea (Lidocaine-prilocaine) 13)  Plaquenil 200 Mg Tabs (Hydroxychloroquine sulfate) .[name].. 1 by mouth two times a day 14)  Nitroglycerin 0.4 Mg Subl (Nitroglycerin) .... As needed 15)  Vicodin 5-500 Mg Tabs  (Hydrocodone-acetaminophen) .... Every 4 hours as needed 16)  Zantac 150 Mg Tabs (Ranitidine hcl) .[name].. 1 by mouth as needed 17)  Metoclopramide Hcl 10 Mg Tabs (Metoclopramide hcl) .[name].. 1 by mouth every 6 hours as needed for headache 18)  Klor-con M20 20 Meq Tbcr (Potassium chloride crys cr) .[name].. 1 by mouth once daily   EKG completed per MD order. Results to MD for review. .................................................................Marland KitchenMarland KitchenSherrie Gardner  May 23, 2007 11:18 AM  Left chest portacath flushedwithout difficulty using sterile technique.  Negative blood return.  Flushed with 10cc heparin.  Pt. tolerated well. .................................................................Marland KitchenMarland KitchenLutricia Horsfall  May 23, 2007 11:50 AM   ] Laboratory Results   Blood Tests   Date/Time Recieved: May 23, 2007 11:21 AM    CBC HGB:  16.4 g/dL   (Normal Range: 41.3-24.4 in Males, 12.0-15.0 in Females)

## 2010-03-31 NOTE — Letter (Signed)
Summary: demographics  demographics   Imported By: Altamease Oiler 11/13/2008 13:53:35  _____________________________________________________________________  External Attachment:    Type:   Image     Comment:   External Document

## 2010-03-31 NOTE — Progress Notes (Signed)
Summary: rheumatology report  Phone Note Call from Patient   Caller: Patient Call For: sherrie Summary of Call: patient wanting to know if you received a report from the rheumatologist in [place]. if so, when does she need to return to this office. please call patient and advise [phone] Initial call taken by: Magdalene River,  May 17, 2006 10:30 AM  Follow-up for Phone Call        spoke with patient-appt scheduled for 05/25/06 Follow-up by: Lutricia Horsfall,  May 17, 2006 4:42 PM

## 2010-03-31 NOTE — Letter (Signed)
Summary: [facility]  [facility]   Imported By: Curtis Sites 08/19/2008 09:50:55  _____________________________________________________________________  External Attachment:    Type:   Image     Comment:   External Document

## 2010-03-31 NOTE — Letter (Signed)
Summary: No show Letter  No show Letter   Imported By: Lutricia Horsfall 08/28/2007 10:36:28  _____________________________________________________________________  External Attachment:    Type:   Image     Comment:   External Document

## 2010-03-31 NOTE — Assessment & Plan Note (Signed)
Summary: Hypokalemia/port flush   Vital Signs:  Patient profile:   45 year old female Weight:      150.25 pounds O2 Sat:      99 % on Room air Pulse rate:   98 / minute BP sitting:   140 / 102  (right arm)  Vitals Entered By: Micah Flesher, LPN (August 19, 2008 11:34 AM) CC: F/U & Port Flush Left chest port-a-cath flushed.  Positive blood return.  Pt. tolerated well. Micah Flesher, LPN  August 19, 2008 11:58 AM    CC:  F/U & Port Flush.  History of Present Illness: Here for port flush.  Her K+ was slightly low on her labs in May and we are going to recheck it today.  She did see Dr. Phylliss Bob and he told her that there is really nothing else that can be done for her fingers.  She says she is doing well with the hot humid weather.  Allergies: No Known Drug Allergies  Past History:  Past Medical History: Reviewed history from 03/28/2008 and no changes required. Allergic rhinitis Asthma/Lupus lung Depression GERD Hypertension IBS overactive bladder Lupus with nephritis, Raynaud's, lung disease (Dr. Phylliss Bob) gout migraines TMJ disorder Elevated liver enzymes--biopsy with hepatic steatosis  Physical Exam  General:  alert, well-developed, and well-nourished.     Impression & Recommendations:  Problem # 1:  HYPOKALEMIA (ICD-276.8)  Orders: T-Basic Metabolic Panel [id number])  Problem # 2:  FITTING/ADJUSTMENT, VASCULAR CATHETER (ICD-V58.81)  Orders: Port-a-Cath Flush ([id number])  Complete Medication List: 1)  Lotensin 40 Mg Tabs (Benazepril hcl) .... Once daily 2)  Aldactone 25 Mg Tabs (Spironolactone) .[name].. 1 by mouth two times a day 3)  Lasix 80 Mg Tabs (Furosemide) .[name].. 1 by mouth two times a day 4)  Hydralazine Hcl 100 Mg Tabs (Hydralazine hcl) .[name].. 1 by mouth three times a day 5)  Flexeril 10 Mg Tabs (Cyclobenzaprine hcl) .... Three times a day 6)  Allopurinol 100 Mg Tabs (Allopurinol) .... Once daily 7)  Fexofenadine Hcl 180 Mg Tabs (Fexofenadine hcl) .... Once daily as  needed 8)  Emla 2.5-2.5 % Crea (Lidocaine-prilocaine) 9)  Vicodin 5-500 Mg Tabs (Hydrocodone-acetaminophen) .... Every 4 hours as needed 10)  Metoclopramide Hcl 10 Mg Tabs (Metoclopramide hcl) .[name].. 1 by mouth every 6 hours as needed for headache 11)  Amlodipine Besylate 10 Mg Tabs (Amlodipine besylate) .[name].. 1 by mouth once daily 12)  Felodipine 10 Mg Xr24h-tab (Felodipine) .[name].. 1 by mouth once daily 13)  Omeprazole 20 Mg Cpdr (Omeprazole) .[name].. 1 by mouth two times a day

## 2010-03-31 NOTE — Letter (Signed)
Summary: final notice no show Letter  final notice no show Letter   Imported By: Curtis Sites 06/26/2008 14:51:22  _____________________________________________________________________  External Attachment:    Type:   Image     Comment:   External Document

## 2010-03-31 NOTE — Letter (Signed)
Summary: [facility]  [facility]   Imported By: Curtis Sites 04/28/2007 13:21:39  _____________________________________________________________________  External Attachment:    Type:   Image     Comment:   External Document

## 2010-03-31 NOTE — Letter (Signed)
Summary: [facility] NOTE  [facility] NOTE   Imported By: Magdalene River 07/11/2006 12:13:47  _____________________________________________________________________  External Attachment:    Type:   Image     Comment:   External Document

## 2010-03-31 NOTE — Letter (Signed)
Summary: [facility]  [facility]   Imported By: Donneta Romberg 12/29/2006 11:41:53  _____________________________________________________________________  External Attachment:    Type:   Image     Comment:   External Document

## 2010-03-31 NOTE — Letter (Signed)
Summary: cardio  cardio   Imported By: Altamease Oiler 11/13/2008 13:53:57  _____________________________________________________________________  External Attachment:    Type:   Image     Comment:   External Document

## 2010-03-31 NOTE — Progress Notes (Signed)
Summary: Port Flush  ---- Converted from flag ---- ---- 05/09/2008 11:48 AM, Erle Crocker MD wrote: Crystal Patterson asked if she can get her port flushed at [facility].  Can you call and check if there is a place there that it can be done?  [facility]. ------------------------------  [facility] who [facility] to [facility], who states that the patient has to be a patient of a physician on staff at their hospital befor they will set her up to have port flushes at their facility.  Crystal Julian Reil LPN  May 10, 2008 8:31 AM   Please call patient and let her know that.  She will need a follow up appointment in 6 weeks for her port flush. Erle Crocker MD  May 10, 2008 8:48 AM   Pt advised of this.  Crystal Gardner LPN  May 10, 2008 11:55 AM

## 2010-03-31 NOTE — Assessment & Plan Note (Signed)
Summary: HTN/port flush/elevated liver enzymes   Vital Signs:  Patient Profile:   45 Years Old Female Height:     64 inches (162.56 cm) Pulse rate:   62 / minute Resp:     8 per minute BP sitting:   134 / 98  (right arm)  Pt. in pain?   no  Vitals Entered By: Crystal Patterson (June 22, 2006 10:16 AM)                Chief Complaint:  recheck.  History of Present Illness: Crystal Patterson presents today, and poor recall a blood pressures have run 120 to 178 over 87 to 124.  She notes that she did see Dr. Phylliss Bob and brings me a note that her AST and ALT are still elevated, and that her hepatitis B and C. are negative.  She cannot tell me exactly what Dr. Phylliss Bob.  Would like to do about this.  She is uncertain if she's ever had a GI evaluation or a liver biopsy done.  Prior Medications: LOTENSIN 40 MG TABS (BENAZEPRIL HCL) once daily ALDACTONE 25 MG TABS (SPIRONOLACTONE) D LASIX 80 MG TABS (FUROSEMIDE) once daily FLEXERIL 10 MG TABS (CYCLOBENZAPRINE HCL) three times a day COLCHICINE 0.6 MG TABS (COLCHICINE) two times a day PLENDIL 10 MG TB24 (FELODIPINE) once daily PREDNISONE 5 MG TABS (PREDNISONE) once daily ALLOPURINOL 100 MG TABS (ALLOPURINOL) once daily FEXOFENADINE HCL 180 MG TABS (FEXOFENADINE HCL) once daily TOPAMAX 100 MG TABS (TOPIRAMATE) 1 by mouth at bedtime NASONEX  SUSP (MOMETASONE FUROATE SUSP)  EMLA 2.5-2.5 % CREA (LIDOCAINE-PRILOCAINE)  PLAQUENIL 200 MG TABS (HYDROXYCHLOROQUINE SULFATE) 1 by mouth two times a day NIFEDIPINE 60 MG TB24 (NIFEDIPINE) 1 by mouth two times a day NITROGLYCERIN 0.4 MG SUBL (NITROGLYCERIN) as needed VICODIN 5-500 MG TABS (HYDROCODONE-ACETAMINOPHEN) every 4 hours as needed ZANTAC 150 MG TABS (RANITIDINE HCL) 1 by mouth as needed METOCLOPRAMIDE HCL 10 MG TABS (METOCLOPRAMIDE HCL) 1 by mouth as needed Current Allergies: No known allergies   Past Medical History:    Reviewed history from 05/25/2006 and no changes required:       Allergic  rhinitis       Asthma/Lupus lung       Depression       GERD       Hypertension       IBS       overactive bladder       Lupus with nephritis, Raynaud's, lung diaease       gout       migraines       TMJ disorder      Physical Exam  General:     alert and overweight-appearing.      Impression & Recommendations:  Problem # 1:  HYPERTENSION (ICD-401.9) her blood pressure is suboptimal so we will increase her hydralazine 100 mg 3 times a day and have her continue monitoring her blood pressure. Her updated medication list for this problem includes:    Lotensin 40 Mg Tabs (Benazepril hcl) ..... Once daily    Aldactone 25 Mg Tabs (Spironolactone) .[name]... D    Lasix 80 Mg Tabs (Furosemide) ..... Once daily    Hydralazine Hcl 100 Mg Tabs (Hydralazine hcl) .[name]... 1 by mouth three times a day    Plendil 10 Mg Tb24 (Felodipine) ..... Once daily    Nifedipine 60 Mg Tb24 (Nifedipine) .[name]... 1 by mouth two times a day   Problem # 2:  FITTING/ADJUSTMENT, VASCULAR CATHETER (ICD-V58.81) Port-A-Cath was accessed  and flushed under aseptic technique without difficulty today Orders: Port-a-Cath Flush ([id number])   Problem # 3:  LIVER FUNCTION TESTS, ABNORMAL (ICD-794.8) I think the assumption has been that her liver enzymes are elevated from her lupus but certainly if Dr. Phylliss Bob thinks further evaluation is necessary we will get her referred to gastroenterology.  Medications Added to Medication List This Visit: 1)  Hydralazine Hcl 100 Mg Tabs (Hydralazine hcl) .[name].. 1 by mouth three times a day   Patient Instructions: 1)  follow-up in 6 weeks with blood pressure checks.  Port-A-Cath will need to be flushed at that time.

## 2010-03-31 NOTE — Letter (Signed)
Summary: flowsheets  flowsheets   Imported By: Altamease Oiler 11/13/2008 13:54:16  _____________________________________________________________________  External Attachment:    Type:   Image     Comment:   External Document

## 2010-03-31 NOTE — Letter (Signed)
Summary: [facility]  [facility]   Imported By: Curtis Sites 01/15/2008 09:06:03  _____________________________________________________________________  External Attachment:    Type:   Image     Comment:   External Document

## 2010-03-31 NOTE — Assessment & Plan Note (Signed)
Summary: Crystal Patterson/HTN/port-flush   Vital Signs:  Patient Profile:   45 Years Old Female Height:     64 inches (162.56 cm) Pulse rate:   88 / minute BP sitting:   120 / 60  (right arm)  Vitals Entered By: Micah Flesher, LPN (February 15, 2008 8:50 AM)                 Chief Complaint:  HTN f/u.  History of Present Illness: Patient presents today for routine follow up.  She says she needs to make an appointment with Dr. Phylliss Bob but does not have his number.  She is having a lot of trouble with her fingers but is wearing gloves, using the EMLA cream and taking the Felodipine.  SHe says she is taking 60 hydrocodone pills per month mostly due to her hands.    Current Allergies: No known allergies   Past Medical History:    Reviewed history from 03/28/2007 and no changes required:       Allergic rhinitis       Asthma/Lupus lung       Depression       GERD       Hypertension       IBS       overactive bladder       Lupus with nephritis, Raynaud's, lung disease (Dr. Phylliss Bob)       gout       migraines       TMJ disorder      Physical Exam  General:     alert, well-developed, and well-nourished.   Extremities:     soft tissue swelling of fingers with small pinpoint areas of distal digital necrosis.    Impression & Recommendations:  Problem # 1:  RAYNAUDS SYNDROME (ICD-443.0) hydrocodone refilled and OK to use two times a day.  She needs to get back with Dr. Phylliss Bob and I did give her his telephone number.    Problem # 2:  HYPERTENSION (ICD-401.9) BP looks great. Her updated medication list for this problem includes:    Lotensin 40 Mg Tabs (Benazepril hcl) ..... Once daily    Aldactone 25 Mg Tabs (Spironolactone) .[name]... 1 by mouth two times a day    Lasix 80 Mg Tabs (Furosemide) .[name]... 1 by mouth two times a day    Hydralazine Hcl 100 Mg Tabs (Hydralazine hcl) .[name]... 1 by mouth three times a day    Amlodipine Besylate 10 Mg Tabs (Amlodipine besylate) .[name]... 1 by mouth  once daily    Felodipine 10 Mg Xr24h-tab (Felodipine) .[name]... 1 by mouth once daily   Problem # 3:  FITTING/ADJUSTMENT, VASCULAR CATHETER (ICD-V58.81) Left chest port accessed and flushed today. Orders: Port-a-Cath Flush ([id number])   Complete Medication List: 1)  Lotensin 40 Mg Tabs (Benazepril hcl) .... Once daily 2)  Aldactone 25 Mg Tabs (Spironolactone) .[name].. 1 by mouth two times a day 3)  Lasix 80 Mg Tabs (Furosemide) .[name].. 1 by mouth two times a day 4)  Hydralazine Hcl 100 Mg Tabs (Hydralazine hcl) .[name].. 1 by mouth three times a day 5)  Flexeril 10 Mg Tabs (Cyclobenzaprine hcl) .... Three times a day 6)  Colchicine 0.6 Mg Tabs (Colchicine) .... Two times a day 7)  Prednisone 5 Mg Tabs (Prednisone) .... Once daily 8)  Allopurinol 100 Mg Tabs (Allopurinol) .... Once daily 9)  Fexofenadine Hcl 180 Mg Tabs (Fexofenadine hcl) .... Once daily 10)  Topamax 100 Mg Tabs (Topiramate) .[name].. 1 by mouth at bedtime  11)  Nasonex Susp (Mometasone furoate susp) 12)  Emla 2.5-2.5 % Crea (Lidocaine-prilocaine) 13)  Plaquenil 200 Mg Tabs (Hydroxychloroquine sulfate) .[name].. 1 by mouth two times a day 14)  Nitroglycerin 0.4 Mg Subl (Nitroglycerin) .... As needed 15)  Vicodin 5-500 Mg Tabs (Hydrocodone-acetaminophen) .... Every 4 hours as needed 16)  Zantac 150 Mg Tabs (Ranitidine hcl) .[name].. 1 by mouth as needed 17)  Metoclopramide Hcl 10 Mg Tabs (Metoclopramide hcl) .[name].. 1 by mouth every 6 hours as needed for headache 18)  Klor-con M20 20 Meq Tbcr (Potassium chloride crys cr) .[name].. 1 by mouth once daily 19)  Amlodipine Besylate 10 Mg Tabs (Amlodipine besylate) .[name].. 1 by mouth once daily 20)  Felodipine 10 Mg Xr24h-tab (Felodipine) .[name].. 1 by mouth once daily 21)  Omeprazole 20 Mg Cpdr (Omeprazole) .[name].. 1 by mouth two times a day   Patient Instructions: 1)  Please schedule a follow-up appointment in 6 weeks.   Prescriptions: VICODIN 5-500 MG TABS (HYDROCODONE-ACETAMINOPHEN) every 4 hours as needed  #60 x 2    Entered and Authorized by:   Erle Crocker MD   Signed by:   Erle Crocker MD on 02/15/2008   Method used:   Printed then faxed to ...       [facility] [address] (retail)       [address]       [place], [place]  [address]       Ph: [phone] or [phone]       Fax: [phone]   RxID:   [id number]  ]

## 2010-03-31 NOTE — Progress Notes (Signed)
Summary: 03/28/07 lab results  Phone Note Outgoing Call   Call placed by: Sonny Dandy,  March 29, 2007 9:11 AM Summary of Call: message left for patient to return call .................................................................Marland KitchenMarland KitchenSonny Dandy  March 29, 2007 9:12 AM   Results given to patient, voices understanding   .................................................................Marland KitchenMarland KitchenSonny Dandy  March 29, 2007 9:27 AM  Initial call taken by: Sonny Dandy,  March 29, 2007 9:27 AM

## 2010-03-31 NOTE — Letter (Signed)
Summary: consents  consents   Imported By: Altamease Oiler 11/13/2008 13:52:56  _____________________________________________________________________  External Attachment:    Type:   Image     Comment:   External Document

## 2010-03-31 NOTE — Letter (Signed)
Summary: progress notes  progress notes   Imported By: Altamease Oiler 11/13/2008 13:56:17  _____________________________________________________________________  External Attachment:    Type:   Image     Comment:   External Document

## 2010-03-31 NOTE — Letter (Signed)
Summary: [employer] Attending Physician Statement-Disability  Form reviewed and completed.

## 2010-03-31 NOTE — Letter (Signed)
Summary: [facility] [employer] [facility] [employer] [facility]   Imported By: Curtis Sites 05/26/2007 15:04:37  _____________________________________________________________________  External Attachment:    Type:   Image     Comment:   External Document

## 2010-03-31 NOTE — Assessment & Plan Note (Signed)
Summary: HTN/port flush   Vital Signs:  Patient Profile:   45 Years Old Female Height:     64 inches (162.56 cm) Pulse rate:   62 / minute Resp:     8 per minute BP sitting:   116 / 74  (right arm)  Vitals Entered By: Lutricia Horsfall (Jul 07, 2007 11:28 AM)                 Chief Complaint:  followup.  History of Present Illness: Patient presents today for routine follow up.  She has not seen Dr. Karilyn Cota yet because she doesn't have the money for the copay.  His receptionist told her that when she has the money she just needs to come by and schedule and appointment.  She never heard from [facility] for her eye exam.    She doesn't want to have to go see the nephrologist is [place].  She is scheduled to see him next week.      Current Allergies: No known allergies   Past Medical History:    Reviewed history from 03/28/2007 and no changes required:       Allergic rhinitis       Asthma/Lupus lung       Depression       GERD       Hypertension       IBS       overactive bladder       Lupus with nephritis, Raynaud's, lung disease (Dr. Phylliss Bob)       gout       migraines       TMJ disorder      Physical Exam  General:     alert and overweight-appearing.      Impression & Recommendations:  Problem # 1:  HYPERTENSION (ICD-401.9) Her blood pressure looks great.  She is taking her medications.  I recommended she see Dr. Lowell Guitar next week so he can see her blood pressure and meds and ask him if it is OK for her to follow up here as she is doing much better.  I also reinforced to her the need to take her medications as prescribed. Her updated medication list for this problem includes:    Lotensin 40 Mg Tabs (Benazepril hcl) ..... Once daily    Aldactone 25 Mg Tabs (Spironolactone) .[name]... 1 by mouth two times a day    Lasix 80 Mg Tabs (Furosemide) .[name]... 1 by mouth two times a day    Hydralazine Hcl 100 Mg Tabs (Hydralazine hcl) .[name]... 1 by mouth three times a day  Amlodipine Besylate 10 Mg Tabs (Amlodipine besylate) .[name]... 1 by mouth once daily   Problem # 2:  FITTING/ADJUSTMENT, VASCULAR CATHETER (ICD-V58.81) port flushed. Orders: Port-a-Cath Flush ([id number])   Complete Medication List: 1)  Lotensin 40 Mg Tabs (Benazepril hcl) .... Once daily 2)  Aldactone 25 Mg Tabs (Spironolactone) .[name].. 1 by mouth two times a day 3)  Lasix 80 Mg Tabs (Furosemide) .[name].. 1 by mouth two times a day 4)  Hydralazine Hcl 100 Mg Tabs (Hydralazine hcl) .[name].. 1 by mouth three times a day 5)  Flexeril 10 Mg Tabs (Cyclobenzaprine hcl) .... Three times a day 6)  Colchicine 0.6 Mg Tabs (Colchicine) .... Two times a day 7)  Prednisone 5 Mg Tabs (Prednisone) .... Once daily 8)  Allopurinol 100 Mg Tabs (Allopurinol) .... Once daily 9)  Fexofenadine Hcl 180 Mg Tabs (Fexofenadine hcl) .... Once daily 10)  Topamax 100 Mg  Tabs (Topiramate) .[name].. 1 by mouth at bedtime 11)  Nasonex Susp (Mometasone furoate susp) 12)  Emla 2.5-2.5 % Crea (Lidocaine-prilocaine) 13)  Plaquenil 200 Mg Tabs (Hydroxychloroquine sulfate) .[name].. 1 by mouth two times a day 14)  Nitroglycerin 0.4 Mg Subl (Nitroglycerin) .... As needed 15)  Vicodin 5-500 Mg Tabs (Hydrocodone-acetaminophen) .... Every 4 hours as needed 16)  Zantac 150 Mg Tabs (Ranitidine hcl) .[name].. 1 by mouth as needed 17)  Metoclopramide Hcl 10 Mg Tabs (Metoclopramide hcl) .[name].. 1 by mouth every 6 hours as needed for headache 18)  Klor-con M20 20 Meq Tbcr (Potassium chloride crys cr) .[name].. 1 by mouth once daily 19)  Amlodipine Besylate 10 Mg Tabs (Amlodipine besylate) .[name].. 1 by mouth once daily   Patient Instructions: 1)  Please schedule a follow-up appointment in 6 weeks. Left chest portacath flushedwithout difficulty using sterile technique.  Positive blood return.  Flushed with 10cc heparin.  Pt. tolerated well. Sherrie Gardner  Jul 07, 2007 11:32 AM  ]

## 2010-03-31 NOTE — Assessment & Plan Note (Signed)
Summary: HTN/port flush   Vital Signs:  Patient profile:   45 year old female Height:      64 inches Weight:      153 pounds BMI:     26.36 Pulse rate:   64 / minute BP sitting:   162 / 100  (right arm) Is Patient Diabetic? No  Left chest port-a-cath flushed.  Positive blood return.  Pt. tolerated well.  Micah Flesher, LPN  May 09, 2008 10:50 AM   History of Present Illness: Patient presents today for routine follow up.  She says she took her medication this morning about 2 hours ago.  She has trouble with transportation and is asking if she can get her port flushed at [facility] instead of coming here.  She needs her port flushed today.  She is scheduled to see Dr. Phylliss Bob on June 11.  Preventive Screening-Counseling & Management     Alcohol drinks/day: <1     Alcohol type: beer     Smoking Status: current     Smoking Cessation Counseling: yes     Packs/Day: <0.25--1 every 2 days     Year Started: 1983  Current Medications (verified): 1)  Lotensin 40 Mg Tabs (Benazepril Hcl) .... Once Daily 2)  Aldactone 25 Mg Tabs (Spironolactone) .[name].. 1 By Mouth Two Times A Day 3)  Lasix 80 Mg Tabs (Furosemide) .[name].. 1 By Mouth Two Times A Day 4)  Hydralazine Hcl 100 Mg Tabs (Hydralazine Hcl) .[name].. 1 By Mouth Three Times A Day 5)  Flexeril 10 Mg Tabs (Cyclobenzaprine Hcl) .... Three Times A Day 6)  Allopurinol 100 Mg Tabs (Allopurinol) .... Once Daily 7)  Fexofenadine Hcl 180 Mg Tabs (Fexofenadine Hcl) .... Once Daily As Needed 8)  Emla 2.5-2.5 % Crea (Lidocaine-Prilocaine) 9)  Vicodin 5-500 Mg Tabs (Hydrocodone-Acetaminophen) .... Every 4 Hours As Needed 10)  Metoclopramide Hcl 10 Mg Tabs (Metoclopramide Hcl) .[name].. 1 By Mouth Every 6 Hours As Needed For Headache 11)  Amlodipine Besylate 10 Mg  Tabs (Amlodipine Besylate) .[name].. 1 By Mouth Once Daily 12)  Felodipine 10 Mg  Xr24h-Tab (Felodipine) .[name].. 1 By Mouth Once Daily 13)  Omeprazole 20 Mg  Cpdr (Omeprazole) .[name].. 1 By Mouth Two Times A  Day  Allergies (verified): No Known Drug Allergies  Past Medical History:    Reviewed history from 03/28/2008 and no changes required:       Allergic rhinitis       Asthma/Lupus lung       Depression       GERD       Hypertension       IBS       overactive bladder       Lupus with nephritis, Raynaud's, lung disease (Dr. Phylliss Bob)       gout       migraines       TMJ disorder       Elevated liver enzymes--biopsy with hepatic steatosis  Family History:    Reviewed history from 02/06/2007 and no changes required:       father-70--deceased from colon cancer, DM       mother-64-THA       sisters x2--healthy       sister x1 deceased from GSW       brother x1       brother x1 deceased from complications of a staph infection       son-24--healthy       daughter-25--healthy  Social History:  Single lives alone    Alcohol use-yes-0-2 drinks, <monthly    Drug use-yes-marijuana socially    Occasional cigarettes    Smoking Status:  current    Packs/Day:  <0.25--1 every 2 days  Physical Exam  General:  alert, well-developed, and well-nourished.     Impression & Recommendations:  Problem # 1:  HYPERTENSION (ICD-401.9) BP up.  I have asked her to check her blood pressures at the pharmacy and let me know what her BP runs when she is not here. Her updated medication list for this problem includes:    Lotensin 40 Mg Tabs (Benazepril hcl) ..... Once daily    Aldactone 25 Mg Tabs (Spironolactone) .[name]... 1 by mouth two times a day    Lasix 80 Mg Tabs (Furosemide) .[name]... 1 by mouth two times a day    Hydralazine Hcl 100 Mg Tabs (Hydralazine hcl) .[name]... 1 by mouth three times a day    Amlodipine Besylate 10 Mg Tabs (Amlodipine besylate) .[name]... 1 by mouth once daily    Felodipine 10 Mg Xr24h-tab (Felodipine) .[name]... 1 by mouth once daily  Problem # 2:  FITTING/ADJUSTMENT, VASCULAR CATHETER (ICD-V58.81)  Orders: Port-a-Cath Flush ([id number])  Complete Medication List: 1)  Lotensin 40 Mg Tabs  (Benazepril hcl) .... Once daily 2)  Aldactone 25 Mg Tabs (Spironolactone) .[name].. 1 by mouth two times a day 3)  Lasix 80 Mg Tabs (Furosemide) .[name].. 1 by mouth two times a day 4)  Hydralazine Hcl 100 Mg Tabs (Hydralazine hcl) .[name].. 1 by mouth three times a day 5)  Flexeril 10 Mg Tabs (Cyclobenzaprine hcl) .... Three times a day 6)  Allopurinol 100 Mg Tabs (Allopurinol) .... Once daily 7)  Fexofenadine Hcl 180 Mg Tabs (Fexofenadine hcl) .... Once daily as needed 8)  Emla 2.5-2.5 % Crea (Lidocaine-prilocaine) 9)  Vicodin 5-500 Mg Tabs (Hydrocodone-acetaminophen) .... Every 4 hours as needed 10)  Metoclopramide Hcl 10 Mg Tabs (Metoclopramide hcl) .[name].. 1 by mouth every 6 hours as needed for headache 11)  Amlodipine Besylate 10 Mg Tabs (Amlodipine besylate) .[name].. 1 by mouth once daily 12)  Felodipine 10 Mg Xr24h-tab (Felodipine) .[name].. 1 by mouth once daily 13)  Omeprazole 20 Mg Cpdr (Omeprazole) .[name].. 1 by mouth two times a day  Patient Instructions: 1)  Please schedule a follow-up appointment in 6 weeks unless we can get port flushed at [facility]. 2)  Tobacco is very bad for your health and your loved ones! You Should stop smoking!. Prescriptions: ALLOPURINOL 100 MG TABS (ALLOPURINOL) once daily  #30 x 5   Entered and Authorized by:   Erle Crocker MD   Signed by:   Erle Crocker MD on 05/09/2008   Method used:   Electronically to        [facility] [address] (retail)       [address]       [place], [place]  [address]       Ph: [phone] or [phone]       Fax: [phone]   RxID:   [id number] LOTENSIN 40 MG TABS (BENAZEPRIL HCL) once daily  #30 x 5   Entered and Authorized by:   Erle Crocker MD   Signed by:   Erle Crocker MD on 05/09/2008   Method used:   Electronically to        [facility] [address] (retail)       [address]  [place], [place]  [address]       Ph: [phone] or [phone]        Fax: [phone]   RxID:   [id number] OMEPRAZOLE 20 MG  CPDR (OMEPRAZOLE) 1 by mouth two times a day  #60 x 5   Entered and Authorized by:   Erle Crocker MD   Signed by:   Erle Crocker MD on 05/09/2008   Method used:   Electronically to        [facility] [address] (retail)       [address]       [place], [place]  [address]       Ph: [phone] or [phone]       Fax: [phone]   RxID:   [id number] HYDRALAZINE HCL 100 MG TABS (HYDRALAZINE HCL) 1 by mouth three times a day  #90 x 5   Entered and Authorized by:   Erle Crocker MD   Signed by:   Erle Crocker MD on 05/09/2008   Method used:   Electronically to        [facility] [address] (retail)       [address]       [place], [place]  [address]       Ph: [phone] or [phone]       Fax: [phone]   RxID:   [id number]

## 2010-03-31 NOTE — Letter (Signed)
Summary: misc  misc   Imported By: Altamease Oiler 11/13/2008 13:55:42  _____________________________________________________________________  External Attachment:    Type:   Image     Comment:   External Document

## 2010-03-31 NOTE — Letter (Signed)
Summary: correspondence  correspondence   Imported By: Altamease Oiler 11/13/2008 13:53:14  _____________________________________________________________________  External Attachment:    Type:   Image     Comment:   External Document

## 2010-03-31 NOTE — Assessment & Plan Note (Signed)
Summary: HTN/port flush   Vital Signs:  Patient Profile:   45 Years Old Female Height:     64 inches (162.56 cm) Pulse rate:   62 / minute Resp:     8 per minute BP sitting:   106 / 84  (right arm)  Vitals Entered By: Lutricia Horsfall (October 05, 2007 10:54 AM)                 Chief Complaint:  followup.  History of Present Illness: Patient presents today for routine follow up.  She did take her blood pressure medication this morning.  She needs refills of her BP meds, omeprazole and vicodin.  Her port is due to be flushed.    Current Allergies: No known allergies   Past Medical History:    Reviewed history from 03/28/2007 and no changes required:       Allergic rhinitis       Asthma/Lupus lung       Depression       GERD       Hypertension       IBS       overactive bladder       Lupus with nephritis, Raynaud's, lung disease (Dr. Phylliss Bob)       gout       migraines       TMJ disorder      Physical Exam  General:     alert and overweight-appearing.      Impression & Recommendations:  Problem # 1:  HYPERTENSION (ICD-401.9) BP excellent.  meds refilled. Her updated medication list for this problem includes:    Lotensin 40 Mg Tabs (Benazepril hcl) ..... Once daily    Aldactone 25 Mg Tabs (Spironolactone) .[name]... 1 by mouth two times a day    Lasix 80 Mg Tabs (Furosemide) .[name]... 1 by mouth two times a day    Hydralazine Hcl 100 Mg Tabs (Hydralazine hcl) .[name]... 1 by mouth three times a day    Amlodipine Besylate 10 Mg Tabs (Amlodipine besylate) .[name]... 1 by mouth once daily    Felodipine 10 Mg Xr24h-tab (Felodipine) .[name]... 1 by mouth once daily   Problem # 2:  FITTING/ADJUSTMENT, VASCULAR CATHETER (ICD-V58.81) port flushed. Orders: Port-a-Cath Flush ([address])   Complete Medication List: 1)  Lotensin 40 Mg Tabs (Benazepril hcl) .... Once daily 2)  Aldactone 25 Mg Tabs (Spironolactone) .[name].. 1 by mouth two times a day 3)  Lasix 80 Mg Tabs (Furosemide) .[name].. 1  by mouth two times a day 4)  Hydralazine Hcl 100 Mg Tabs (Hydralazine hcl) .[name].. 1 by mouth three times a day 5)  Flexeril 10 Mg Tabs (Cyclobenzaprine hcl) .... Three times a day 6)  Colchicine 0.6 Mg Tabs (Colchicine) .... Two times a day 7)  Prednisone 5 Mg Tabs (Prednisone) .... Once daily 8)  Allopurinol 100 Mg Tabs (Allopurinol) .... Once daily 9)  Fexofenadine Hcl 180 Mg Tabs (Fexofenadine hcl) .... Once daily 10)  Topamax 100 Mg Tabs (Topiramate) .[name].. 1 by mouth at bedtime 11)  Nasonex Susp (Mometasone furoate susp) 12)  Emla 2.5-2.5 % Crea (Lidocaine-prilocaine) 13)  Plaquenil 200 Mg Tabs (Hydroxychloroquine sulfate) .[name].. 1 by mouth two times a day 14)  Nitroglycerin 0.4 Mg Subl (Nitroglycerin) .... As needed 15)  Vicodin 5-500 Mg Tabs (Hydrocodone-acetaminophen) .... Every 4 hours as needed 16)  Zantac 150 Mg Tabs (Ranitidine hcl) .[name].. 1 by mouth as needed 17)  Metoclopramide Hcl 10 Mg Tabs (Metoclopramide hcl) .[name].. 1 by  mouth every 6 hours as needed for headache 18)  Klor-con M20 20 Meq Tbcr (Potassium chloride crys cr) .[name].. 1 by mouth once daily 19)  Amlodipine Besylate 10 Mg Tabs (Amlodipine besylate) .[name].. 1 by mouth once daily 20)  Felodipine 10 Mg Xr24h-tab (Felodipine) .[name].. 1 by mouth once daily 21)  Omeprazole 20 Mg Cpdr (Omeprazole) .[name].. 1 by mouth two times a day  Left chest portacath flushed without difficulty using sterile technique.  Positive blood return.  Flushed with 10cc heparin.  Pt. tolerated well.  Sherrie Gardner  October 05, 2007 11:08 AM     Prescriptions: VICODIN 5-500 MG TABS (HYDROCODONE-ACETAMINOPHEN) every 4 hours as needed  #60 x 2   Entered and Authorized by:   Erle Crocker MD   Signed by:   Erle Crocker MD on 10/05/2007   Method used:   Faxed to ...       [facility] [address]       [address]       [place], [place]  [address]       Ph: [phone] or [phone]       Fax: [phone]   RxID:    [id number] OMEPRAZOLE 20 MG  CPDR (OMEPRAZOLE) 1 by mouth two times a day  #60 x 5   Entered and Authorized by:   Erle Crocker MD   Signed by:   Erle Crocker MD on 10/05/2007   Method used:   Faxed to ...       [facility] [address]       [address]       [place], [place]  [address]       Ph: [phone] or [phone]       Fax: [phone]   RxID:   [id number] FELODIPINE 10 MG  XR24H-TAB (FELODIPINE) 1 by mouth once daily  #30 x 5   Entered and Authorized by:   Erle Crocker MD   Signed by:   Erle Crocker MD on 10/05/2007   Method used:   Faxed to ...       [facility] [address]       [address]       [place], [place]  [address]       Ph: [phone] or [phone]       Fax: [phone]   RxID:   [id number] ALDACTONE 25 MG TABS (SPIRONOLACTONE) 1 by mouth two times a day  #60 x 5   Entered and Authorized by:   Erle Crocker MD   Signed by:   Erle Crocker MD on 10/05/2007   Method used:   Faxed to ...       [facility] [address]       [address]       [place], [place]  [address]       Ph: [phone] or [phone]       Fax: [phone]   RxID:   [id number] LOTENSIN 40 MG TABS (BENAZEPRIL HCL) once daily  #30 x 5   Entered and Authorized by:   Erle Crocker MD   Signed by:   Erle Crocker MD on 10/05/2007   Method used:   Faxed to ...       [facility] [address]  [address]       [place], [place]  [address]       Ph: [phone] or [phone]       Fax: [phone]   RxID:   [id number] HYDRALAZINE HCL 100 MG TABS (HYDRALAZINE HCL) 1 by mouth three times a day  #90 x 5   Entered and Authorized by:   Erle Crocker MD   Signed by:   Erle Crocker MD on 10/05/2007   Method used:   Faxed to ...       [facility] [address]       [address]        [place], [place]  [address]       Ph: [phone] or [phone]       Fax: [phone]   RxID:   [id number]  ]

## 2010-03-31 NOTE — Letter (Signed)
Summary: lab  lab   Imported By: Altamease Oiler 11/13/2008 13:54:48  _____________________________________________________________________  External Attachment:    Type:   Image     Comment:   External Document

## 2010-03-31 NOTE — Assessment & Plan Note (Signed)
Summary: HTN/port flush   Vital Signs:  Patient Profile:   45 Years Old Female Height:     64 inches (162.56 cm) Pulse rate:   68 / minute Resp:     8 per minute BP sitting:   162 / 112  (right arm)  Vitals Entered By: Lutricia Horsfall (August 24, 2007 9:17 AM)                 Chief Complaint:  followup.  History of Present Illness: Patient presents today for routine follow up.  She says she did take her medications last night but not this morning yet.  She says she is feeling quite well.  Her hands are doing better with the warm weather but otherwise she does not like the heat.  SHe says it does not affect her breathing.  She says she missed her appointment last week because she couldn't get a ride.      Current Allergies: No known allergies   Past Medical History:    Reviewed history from 03/28/2007 and no changes required:       Allergic rhinitis       Asthma/Lupus lung       Depression       GERD       Hypertension       IBS       overactive bladder       Lupus with nephritis, Raynaud's, lung disease (Dr. Phylliss Bob)       gout       migraines       TMJ disorder      Physical Exam  General:     alert and overweight-appearing.   Lungs:     Clear to auscultation bilaterally with good air movement, normal expansion.     Impression & Recommendations:  Problem # 1:  HYPERTENSION (ICD-401.9) She assures me that she is taking her medication but she just hasn't taken it this morning.  She is due for labs.   Her updated medication list for this problem includes:    Lotensin 40 Mg Tabs (Benazepril hcl) ..... Once daily    Aldactone 25 Mg Tabs (Spironolactone) .[name]... 1 by mouth two times a day    Lasix 80 Mg Tabs (Furosemide) .[name]... 1 by mouth two times a day    Hydralazine Hcl 100 Mg Tabs (Hydralazine hcl) .[name]... 1 by mouth three times a day    Amlodipine Besylate 10 Mg Tabs (Amlodipine besylate) .[name]... 1 by mouth once daily  Orders: T-Comprehensive Metabolic Panel  ([id number]) T-Lipid Profile [phone])   Problem # 2:  ENCOUNTER FOR LONG-TERM USE OF OTHER MEDICATIONS (ICD-V58.69)  Orders: T-CBC w/Diff ([id number])   Problem # 3:  FITTING/ADJUSTMENT, VASCULAR CATHETER (ICD-V58.81)  Orders: Port-a-Cath Flush ([address])   Complete Medication List: 1)  Lotensin 40 Mg Tabs (Benazepril hcl) .... Once daily 2)  Aldactone 25 Mg Tabs (Spironolactone) .[name].. 1 by mouth two times a day 3)  Lasix 80 Mg Tabs (Furosemide) .[name].. 1 by mouth two times a day 4)  Hydralazine Hcl 100 Mg Tabs (Hydralazine hcl) .[name].. 1 by mouth three times a day 5)  Flexeril 10 Mg Tabs (Cyclobenzaprine hcl) .... Three times a day 6)  Colchicine 0.6 Mg Tabs (Colchicine) .... Two times a day 7)  Prednisone 5 Mg Tabs (Prednisone) .... Once daily 8)  Allopurinol 100 Mg Tabs (Allopurinol) .... Once daily 9)  Fexofenadine Hcl 180 Mg Tabs (Fexofenadine hcl) .... Once daily 10)  Topamax 100 Mg Tabs (  Topiramate) .[name].. 1 by mouth at bedtime 11)  Nasonex Susp (Mometasone furoate susp) 12)  Emla 2.5-2.5 % Crea (Lidocaine-prilocaine) 13)  Plaquenil 200 Mg Tabs (Hydroxychloroquine sulfate) .[name].. 1 by mouth two times a day 14)  Nitroglycerin 0.4 Mg Subl (Nitroglycerin) .... As needed 15)  Vicodin 5-500 Mg Tabs (Hydrocodone-acetaminophen) .... Every 4 hours as needed 16)  Zantac 150 Mg Tabs (Ranitidine hcl) .[name].. 1 by mouth as needed 17)  Metoclopramide Hcl 10 Mg Tabs (Metoclopramide hcl) .[name].. 1 by mouth every 6 hours as needed for headache 18)  Klor-con M20 20 Meq Tbcr (Potassium chloride crys cr) .[name].. 1 by mouth once daily 19)  Amlodipine Besylate 10 Mg Tabs (Amlodipine besylate) .[name].. 1 by mouth once daily   Patient Instructions: 1)  The patient will be called with the results when they are available. 2)  Please schedule a follow-up appointment in 6 weeks. Left chest portacath flushed without difficulty using sterile technique.  Positive blood return.  Flushed with 10cc heparin.  Pt.  tolerated well. Sherrie Gardner  August 24, 2007 9:52 AM   ]

## 2010-03-31 NOTE — Letter (Signed)
Summary: med hx  med hx   Imported By: Altamease Oiler 11/13/2008 13:55:10  _____________________________________________________________________  External Attachment:    Type:   Image     Comment:   External Document

## 2010-03-31 NOTE — Letter (Signed)
Summary: [facility] NOTE  [facility] NOTE   Imported By: Magdalene River 05/09/2006 15:11:22  _____________________________________________________________________  External Attachment:    Type:   Image     Comment:   External Document

## 2010-03-31 NOTE — Progress Notes (Signed)
Summary: GI referral  Phone Note Outgoing Call   Call placed by: Sonny Dandy,  February 06, 2007 3:50 PM Summary of Call: appointment set for Dr Rehman(their office will ccall with date - time. Initial call taken by: Sonny Dandy,  February 06, 2007 3:51 PM  Follow-up for Phone Call        patient notified of referral Follow-up by: Sonny Dandy,  February 06, 2007 4:21 PM

## 2011-03-23 DIAGNOSIS — Z8739 Personal history of other diseases of the musculoskeletal system and connective tissue: Secondary | ICD-10-CM | POA: Insufficient documentation

## 2011-03-23 DIAGNOSIS — I7300 Raynaud's syndrome without gangrene: Secondary | ICD-10-CM | POA: Insufficient documentation

## 2011-03-23 DIAGNOSIS — Z87448 Personal history of other diseases of urinary system: Secondary | ICD-10-CM | POA: Insufficient documentation

## 2013-01-01 DIAGNOSIS — M79643 Pain in unspecified hand: Secondary | ICD-10-CM | POA: Insufficient documentation

## 2016-05-24 ENCOUNTER — Inpatient Hospital Stay (HOSPITAL_COMMUNITY)
Admission: AD | Admit: 2016-05-24 | Discharge: 2016-05-26 | DRG: 287 | Disposition: A | Discharge status: Home Health | Payer: Medicare Other | Source: Other Acute Inpatient Hospital | Attending: Cardiology | Admitting: Cardiology

## 2016-05-24 DIAGNOSIS — I214 Non-ST elevation (NSTEMI) myocardial infarction: Secondary | ICD-10-CM | POA: Diagnosis not present

## 2016-05-24 DIAGNOSIS — I739 Peripheral vascular disease, unspecified: Secondary | ICD-10-CM

## 2016-05-24 DIAGNOSIS — I119 Hypertensive heart disease without heart failure: Secondary | ICD-10-CM | POA: Diagnosis not present

## 2016-05-24 DIAGNOSIS — F17210 Nicotine dependence, cigarettes, uncomplicated: Secondary | ICD-10-CM | POA: Diagnosis present

## 2016-05-24 DIAGNOSIS — R778 Other specified abnormalities of plasma proteins: Secondary | ICD-10-CM

## 2016-05-24 DIAGNOSIS — I10 Essential (primary) hypertension: Secondary | ICD-10-CM | POA: Diagnosis present

## 2016-05-24 DIAGNOSIS — I5032 Chronic diastolic (congestive) heart failure: Secondary | ICD-10-CM | POA: Diagnosis present

## 2016-05-24 DIAGNOSIS — E876 Hypokalemia: Secondary | ICD-10-CM | POA: Diagnosis present

## 2016-05-24 DIAGNOSIS — I2510 Atherosclerotic heart disease of native coronary artery without angina pectoris: Secondary | ICD-10-CM | POA: Diagnosis not present

## 2016-05-24 DIAGNOSIS — I25111 Atherosclerotic heart disease of native coronary artery with angina pectoris with documented spasm: Secondary | ICD-10-CM | POA: Diagnosis not present

## 2016-05-24 DIAGNOSIS — M329 Systemic lupus erythematosus, unspecified: Secondary | ICD-10-CM | POA: Diagnosis present

## 2016-05-24 DIAGNOSIS — R7989 Other specified abnormal findings of blood chemistry: Secondary | ICD-10-CM | POA: Diagnosis present

## 2016-05-24 DIAGNOSIS — K219 Gastro-esophageal reflux disease without esophagitis: Secondary | ICD-10-CM | POA: Diagnosis present

## 2016-05-24 DIAGNOSIS — I7300 Raynaud's syndrome without gangrene: Secondary | ICD-10-CM | POA: Diagnosis present

## 2016-05-24 DIAGNOSIS — M109 Gout, unspecified: Secondary | ICD-10-CM | POA: Diagnosis present

## 2016-05-24 DIAGNOSIS — I110 Hypertensive heart disease with heart failure: Secondary | ICD-10-CM | POA: Diagnosis not present

## 2016-05-24 DIAGNOSIS — I517 Cardiomegaly: Secondary | ICD-10-CM | POA: Diagnosis present

## 2016-05-24 DIAGNOSIS — R748 Abnormal levels of other serum enzymes: Secondary | ICD-10-CM | POA: Diagnosis not present

## 2016-05-24 HISTORY — DX: Other specified abnormalities of plasma proteins: R77.8

## 2016-05-24 HISTORY — DX: Other specified abnormal findings of blood chemistry: R79.89

## 2016-05-24 MED ORDER — BENAZEPRIL HCL 10 MG PO TABS
40.0000 mg | ORAL_TABLET | Freq: Every day | ORAL | Status: DC
  Administered 2016-05-25 – 2016-05-26 (×2): 40 mg via ORAL
  Filled 2016-05-24 (×3): qty 4

## 2016-05-24 MED ORDER — SPIRONOLACTONE 25 MG PO TABS
25.0000 mg | ORAL_TABLET | Freq: Two times a day (BID) | ORAL | Status: DC
  Administered 2016-05-25 – 2016-05-26 (×2): 25 mg via ORAL
  Filled 2016-05-24 (×3): qty 1

## 2016-05-24 MED ORDER — DEXTROSE 5 % IV SOLN
1.0000 g | INTRAVENOUS | Status: DC
  Administered 2016-05-25 (×2): 1 g via INTRAVENOUS
  Filled 2016-05-24 (×3): qty 10

## 2016-05-24 MED ORDER — ALLOPURINOL 100 MG PO TABS
100.0000 mg | ORAL_TABLET | Freq: Every day | ORAL | Status: DC
  Administered 2016-05-25 – 2016-05-26 (×2): 100 mg via ORAL
  Filled 2016-05-24 (×2): qty 1

## 2016-05-24 MED ORDER — HYDRALAZINE HCL 50 MG PO TABS
100.0000 mg | ORAL_TABLET | Freq: Three times a day (TID) | ORAL | Status: DC
  Administered 2016-05-25 – 2016-05-26 (×4): 100 mg via ORAL
  Filled 2016-05-24 (×4): qty 2

## 2016-05-24 MED ORDER — MAGNESIUM SULFATE 2 GM/50ML IV SOLN
2.0000 g | Freq: Once | INTRAVENOUS | Status: AC
  Administered 2016-05-25: 2 g via INTRAVENOUS
  Filled 2016-05-24: qty 50

## 2016-05-24 MED ORDER — SODIUM CHLORIDE 0.9% FLUSH: 3.0000 mL | INTRAVENOUS | Status: DC | PRN

## 2016-05-24 MED ORDER — ASPIRIN EC 81 MG PO TBEC
81.0000 mg | DELAYED_RELEASE_TABLET | Freq: Every day | ORAL | Status: DC
  Administered 2016-05-26: 81 mg via ORAL
  Filled 2016-05-24: qty 1

## 2016-05-24 MED ORDER — ATENOLOL 25 MG PO TABS
25.0000 mg | ORAL_TABLET | Freq: Every day | ORAL | Status: DC
  Administered 2016-05-25 – 2016-05-26 (×3): 25 mg via ORAL
  Filled 2016-05-24 (×3): qty 1

## 2016-05-24 MED ORDER — AMLODIPINE BESYLATE 10 MG PO TABS
10.0000 mg | ORAL_TABLET | Freq: Every day | ORAL | Status: DC
  Administered 2016-05-25 – 2016-05-26 (×3): 10 mg via ORAL
  Filled 2016-05-24 (×3): qty 1

## 2016-05-24 MED ORDER — SODIUM CHLORIDE 0.9% FLUSH: 3.0000 mL | Freq: Two times a day (BID) | INTRAVENOUS | Status: DC

## 2016-05-24 MED ORDER — PANTOPRAZOLE SODIUM 40 MG PO TBEC
40.0000 mg | DELAYED_RELEASE_TABLET | Freq: Every day | ORAL | Status: DC
  Administered 2016-05-25 – 2016-05-26 (×2): 40 mg via ORAL
  Filled 2016-05-24 (×2): qty 1

## 2016-05-24 MED ORDER — ATORVASTATIN CALCIUM 80 MG PO TABS
80.0000 mg | ORAL_TABLET | Freq: Every day | ORAL | Status: DC
  Administered 2016-05-25 – 2016-05-26 (×2): 80 mg via ORAL
  Filled 2016-05-24 (×2): qty 1

## 2016-05-24 MED ORDER — SODIUM CHLORIDE 0.9 % IV SOLN: 250.0000 mL | INTRAVENOUS | Status: DC | PRN

## 2016-05-24 MED ORDER — NITROGLYCERIN 0.4 MG SL SUBL: 0.4000 mg | SUBLINGUAL_TABLET | SUBLINGUAL | Status: DC | PRN

## 2016-05-24 MED ORDER — FUROSEMIDE 80 MG PO TABS
80.0000 mg | ORAL_TABLET | Freq: Every day | ORAL | Status: DC
  Administered 2016-05-26: 80 mg via ORAL
  Filled 2016-05-24: qty 1

## 2016-05-24 MED ORDER — ONDANSETRON HCL 4 MG/2ML IJ SOLN: 4.0000 mg | Freq: Four times a day (QID) | INTRAMUSCULAR | Status: DC | PRN

## 2016-05-24 MED ORDER — NITROGLYCERIN IN D5W 200-5 MCG/ML-% IV SOLN
0.0000 ug/min | INTRAVENOUS | Status: DC
  Administered 2016-05-25: 5 ug/min via INTRAVENOUS
  Filled 2016-05-24: qty 250

## 2016-05-24 MED ORDER — ACETAMINOPHEN 325 MG PO TABS: 650.0000 mg | ORAL_TABLET | ORAL | Status: DC | PRN

## 2016-05-24 NOTE — H&P (Signed)
Physician History and Physical    Crystal HolidayJudy K Patterson MRN: [id number] DOB/AGE: 01-Feb-1966 51 y.o. Admit date: 05/24/2016  Primary Care Physician: Felecia ShellingFanta Primary Cardiologist: New (lives in [place])  HPI: 51 yo with complicated past history of SLE with history of nephritis, Raynauds, smoking, extensive upper extremity peripheral vascular disease with ischemic injuries to hands and multiple amputated digits [facility].  Despite the upper extremity PAD, she has no history of coronary disease.  About 6-7 months ago, she had some chest pain but did not have a workup at the time.  About 2 days ago, she started having chest pain again.  It has been central substernal aching.  It will last for a couple hours then resolve, then come back again.  No trigger.  She denies dyspnea.  ECG with LVH and repolarization abnormality, possible old ASMI.  [facility], TnT returned elevated at 0.47.  Pro-BNP was also elevated.  Additionally, there was concern for possible left base PNA and she got a dose of ceftriaxone.  She has been coughing but has not had a fever that she knows of.  BP is high tonight, she has not had her home BP meds.  She continues to smoke 2-3 cigarettes day along with occasional ETOH (sounds like she was a heavier drinker in the past).  No meds specific for SLE, no longer seeing a rheumatologist.  She is also no longer seeing a vascular surgeon ([facility]   Review of systems complete and found to be negative unless listed above   PMH: 1. HTN 2. SLE: History of lupus nephritis.  3. Raynauds syndrome 4. PAD: Arterial thrombosis arms (left ulcer, right radial).  She has had ischemic injury to multiple fingers on both hands with amputation.  5. h/o smoking 6. h/o heavy ETOH 7. h/o osteomyelitis of hand 8. GERD 9. Gout 10. Peripheral sympathectomy bilateral hands in 2014 due to pain.   FH: No premature CAD  SH: Lives in [place] alone,  occasional smoking, 2-3 beers/day, occasional marijuana  Physical Exam: Blood pressure (!) 167/110, pulse 76, temperature 98.4 F (36.9 C), temperature source Oral, resp. rate 18, height 5\' 5"  (1.651 m), weight 134 lb 3.2 oz (60.9 kg), SpO2 100 %.  General: NAD Neck: No JVD, no thyromegaly or thyroid nodule.  Lungs: Clear to auscultation bilaterally with normal respiratory effort. CV: Nondisplaced PMI.  Heart regular S1/S2, no S3/S4, no murmur.  No peripheral edema.  No carotid bruit.  2+ right radial pulse, 1+ left radial pulse.  Unable to palpate PT pulses.  Abdomen: Soft, nontender, no hepatosplenomegaly, no distention.  Skin: Intact without lesions or rashes.  Neurologic: Alert and oriented x 3.  Psych: Normal affect. Extremities: No clubbing or cyanosis.  HEENT: Normal.   Meds: Amlodipine 10 daily Omeprazole 20 bid allopurinol 100 daily Spironolactone 25 bid Hydralazine 100 tid Lasix 80 mg daily Atenolol 25 daily Benazepril 40 daily  Labs: Na 138 K 3.1 Creatinine 0.78 TnT 0.47 Mg 1.4 Pro BNP 987 WBC 8.6 hgb 13.6 plts 171   Radiology: - CXR ([facility]): Possible LLL PNA  EKG (reviewed personally): NSR, LVH with repolarization abnormality, anteroseptal Qs  ASSESSMENT AND PLAN: 51 yo with complicated past history of SLE with history of nephritis, Raynauds, smoking, extensive upper extremity peripheral vascular disease with ischemic injuries to hands and multiple amputated digits [facility].  Troponin was elevated concerning for NSTEMI and she was  sent to [facility].  1. CAD: Concern for NSTEMI with chest pain and elevated troponin.  Risk factors include chronic inflammatory disease (SLE), smoking, HTN, known PAD.   - Will use NTG gtt to help with BP and with chest pain (currently 5/10 chest pain).  - Cycle troponin to peak.  - Will repeat ECG.  - Arrange for echo.  - Will plan for coronary angiography in am.  Would use femoral access given  extensive history of upper extremity arterial occlusion and finger amputations.  - Continue atenolol, will give ASA 81, high dose statin, and heparin gtt.  2. PAD: Multiple finger amputations and history of upper extremity PAD.  I am able to palpate her radial pulses.  Suspect this is related to her SLE and Raynauds.  Additionally, I am unable to palpate her PT pulses.  - Will order lower extremity arterial dopplers.  3. SLE: On no specific meds for lupus.  She will need to re-establish with a rheumatologist.  4. Smoking: Strongly encouraged her to quit.  5. HTN: Restart home antihypertensives, will also use NTG gtt for now.  6. Chronic diastolic CHF: Presumed given use of Lasix at home.  Pro BNP elevated but she does not appear significantly volume overloaded.  - Continue home Lasix.  7. ?PNA: WBCs normal and afebrile, but PNA on CXR at [facility]. Ceftriaxone given in ER.  - Continue ceftriaxone for now.  - Will get CXR PA/lateral.  If does not look like PNA, will stop abx.   Marca Ancona 05/24/2016 11:25 PM   Signed: Marca Ancona 05/24/2016, 11:06 PM Co-Sign MD

## 2016-05-25 ENCOUNTER — Observation Stay (HOSPITAL_COMMUNITY): Admit: 2016-05-25 | Payer: Medicare Other | Admitting: Cardiovascular Disease

## 2016-05-25 ENCOUNTER — Encounter (HOSPITAL_COMMUNITY): Payer: Self-pay

## 2016-05-25 ENCOUNTER — Encounter (HOSPITAL_COMMUNITY)
Admission: AD | Disposition: A | Discharge status: Home Health | Payer: Self-pay | Source: Other Acute Inpatient Hospital | Attending: Cardiology

## 2016-05-25 DIAGNOSIS — I5032 Chronic diastolic (congestive) heart failure: Secondary | ICD-10-CM

## 2016-05-25 HISTORY — PX: RIGHT/LEFT HEART CATH AND CORONARY ANGIOGRAPHY: CATH118266

## 2016-05-25 HISTORY — PX: CARDIAC CATHETERIZATION: SHX172

## 2016-05-25 LAB — CBC WITH DIFFERENTIAL/PLATELET
Basophils Absolute: 0 10*3/uL (ref 0.0–0.1)
Basophils Relative: 0 %
Eosinophils Absolute: 0 10*3/uL (ref 0.0–0.7)
Eosinophils Relative: 1 %
HCT: 41.6 % (ref 36.0–46.0)
Hemoglobin: 13.8 g/dL (ref 12.0–15.0)
Lymphocytes Relative: 22 %
Lymphs Abs: 1.6 10*3/uL (ref 0.7–4.0)
MCH: 31.3 pg (ref 26.0–34.0)
MCHC: 33.2 g/dL (ref 30.0–36.0)
MCV: 94.3 fL (ref 78.0–100.0)
Monocytes Absolute: 1.3 10*3/uL — ABNORMAL HIGH (ref 0.1–1.0)
Monocytes Relative: 18 %
Neutro Abs: 4.3 10*3/uL (ref 1.7–7.7)
Neutrophils Relative %: 59 %
Platelets: 155 10*3/uL (ref 150–400)
RBC: 4.41 MIL/uL (ref 3.87–5.11)
RDW: 12.1 % (ref 11.5–15.5)
WBC: 7.3 10*3/uL (ref 4.0–10.5)

## 2016-05-25 LAB — CBC
HCT: 41.2 % (ref 36.0–46.0)
HCT: 42.2 % (ref 36.0–46.0)
Hemoglobin: 13.5 g/dL (ref 12.0–15.0)
Hemoglobin: 13.9 g/dL (ref 12.0–15.0)
MCH: 30.6 pg (ref 26.0–34.0)
MCH: 31.1 pg (ref 26.0–34.0)
MCHC: 32.8 g/dL (ref 30.0–36.0)
MCHC: 32.9 g/dL (ref 30.0–36.0)
MCV: 93.4 fL (ref 78.0–100.0)
MCV: 94.4 fL (ref 78.0–100.0)
Platelets: 198 10*3/uL (ref 150–400)
Platelets: 197 10*3/uL (ref 150–400)
RBC: 4.41 MIL/uL (ref 3.87–5.11)
RBC: 4.47 MIL/uL (ref 3.87–5.11)
RDW: 11.9 % (ref 11.5–15.5)
RDW: 12.1 % (ref 11.5–15.5)
WBC: 9 10*3/uL (ref 4.0–10.5)
WBC: 7.9 10*3/uL (ref 4.0–10.5)

## 2016-05-25 LAB — BASIC METABOLIC PANEL
Anion gap: 12 (ref 5–15)
BUN: 6 mg/dL (ref 6–20)
CO2: 26 mmol/L (ref 22–32)
Calcium: 9 mg/dL (ref 8.9–10.3)
Chloride: 98 mmol/L — ABNORMAL LOW (ref 101–111)
Creatinine, Ser: 0.74 mg/dL (ref 0.44–1.00)
GFR calc Af Amer: >60 mL/min (ref >60)
GFR calc non Af Amer: >60 mL/min (ref >60)
Glucose, Bld: 105 mg/dL — ABNORMAL HIGH (ref 65–99)
Potassium: 3.3 mmol/L — ABNORMAL LOW (ref 3.5–5.1)
Sodium: 136 mmol/L (ref 135–145)

## 2016-05-25 LAB — POCT I-STAT 3, VENOUS BLOOD GAS (G3P V)
Acid-Base Excess: 2 mmol/L (ref 0.0–2.0)
Bicarbonate: 26.8 mmol/L (ref 20.0–28.0)
O2 Saturation: 77 %
TCO2: 28 mmol/L (ref 0–100)
pCO2, Ven: 43 mmHg — ABNORMAL LOW (ref 44.0–60.0)
pH, Ven: 7.402 (ref 7.250–7.430)
pO2, Ven: 42 mmHg (ref 32.0–45.0)

## 2016-05-25 LAB — COMPREHENSIVE METABOLIC PANEL
ALT: 9 U/L — ABNORMAL LOW (ref 14–54)
AST: 22 U/L (ref 15–41)
Albumin: 3.2 g/dL — ABNORMAL LOW (ref 3.5–5.0)
Alkaline Phosphatase: 60 U/L (ref 38–126)
Anion gap: 11 (ref 5–15)
BUN: 5 mg/dL — ABNORMAL LOW (ref 6–20)
CO2: 29 mmol/L (ref 22–32)
Calcium: 8.9 mg/dL (ref 8.9–10.3)
Chloride: 96 mmol/L — ABNORMAL LOW (ref 101–111)
Creatinine, Ser: 0.81 mg/dL (ref 0.44–1.00)
GFR calc Af Amer: >60 mL/min (ref >60)
GFR calc non Af Amer: >60 mL/min (ref >60)
Glucose, Bld: 92 mg/dL (ref 65–99)
Potassium: 3.2 mmol/L — ABNORMAL LOW (ref 3.5–5.1)
Sodium: 136 mmol/L (ref 135–145)
Total Bilirubin: 1.9 mg/dL — ABNORMAL HIGH (ref 0.3–1.2)
Total Protein: 6.8 g/dL (ref 6.5–8.1)

## 2016-05-25 LAB — LIPID PANEL
Cholesterol: 134 mg/dL (ref 0–200)
HDL: 39 mg/dL — ABNORMAL LOW (ref >40)
LDL Cholesterol: 82 mg/dL (ref 0–99)
Total CHOL/HDL Ratio: 3.4 RATIO
Triglycerides: 63 mg/dL (ref <150)
VLDL: 13 mg/dL (ref 0–40)

## 2016-05-25 LAB — MAGNESIUM: Magnesium: 1.4 mg/dL — ABNORMAL LOW (ref 1.7–2.4)

## 2016-05-25 LAB — CREATININE, SERUM
Creatinine, Ser: 0.85 mg/dL (ref 0.44–1.00)
GFR calc Af Amer: >60 mL/min (ref >60)
GFR calc non Af Amer: >60 mL/min (ref >60)

## 2016-05-25 LAB — BRAIN NATRIURETIC PEPTIDE: B Natriuretic Peptide: 399.5 pg/mL — ABNORMAL HIGH (ref 0.0–100.0)

## 2016-05-25 LAB — POCT I-STAT 3, ART BLOOD GAS (G3+)
Acid-Base Excess: 1 mmol/L (ref 0.0–2.0)
Bicarbonate: 25.7 mmol/L (ref 20.0–28.0)
O2 Saturation: 99 %
TCO2: 27 mmol/L (ref 0–100)
pCO2 arterial: 40.6 mmHg (ref 32.0–48.0)
pH, Arterial: 7.41 (ref 7.350–7.450)
pO2, Arterial: 135 mmHg — ABNORMAL HIGH (ref 83.0–108.0)

## 2016-05-25 LAB — POCT ACTIVATED CLOTTING TIME: Activated Clotting Time: 142 seconds

## 2016-05-25 LAB — HIV ANTIBODY (ROUTINE TESTING W REFLEX): HIV Screen 4th Generation wRfx: NONREACTIVE

## 2016-05-25 LAB — TROPONIN I
Troponin I: 0.89 ng/mL (ref <0.03)
Troponin I: 0.74 ng/mL (ref <0.03)
Troponin I: 1.23 ng/mL (ref <0.03)

## 2016-05-25 LAB — HEPARIN LEVEL (UNFRACTIONATED): Heparin Unfractionated: <0.1 IU/mL — ABNORMAL LOW (ref 0.30–0.70)

## 2016-05-25 LAB — PROTIME-INR
INR: 1.15
Prothrombin Time: 14.8 seconds (ref 11.4–15.2)

## 2016-05-25 LAB — TSH: TSH: 5.59 u[IU]/mL — ABNORMAL HIGH (ref 0.350–4.500)

## 2016-05-25 SURGERY — RIGHT/LEFT HEART CATH AND CORONARY ANGIOGRAPHY
Anesthesia: LOCAL

## 2016-05-25 MED ORDER — IOPAMIDOL (ISOVUE-370) INJECTION 76%
INTRAVENOUS | Status: DC | PRN
  Administered 2016-05-25: 60 mL via INTRA_ARTERIAL

## 2016-05-25 MED ORDER — SODIUM CHLORIDE 0.9 % WEIGHT BASED INFUSION
1.0000 mL/kg/h | INTRAVENOUS | Status: DC
  Administered 2016-05-25: 1 mL/kg/h via INTRAVENOUS

## 2016-05-25 MED ORDER — SODIUM CHLORIDE 0.9% FLUSH: 3.0000 mL | Freq: Two times a day (BID) | INTRAVENOUS | Status: DC

## 2016-05-25 MED ORDER — SODIUM CHLORIDE 0.9 % IV SOLN: 250.0000 mL | INTRAVENOUS | Status: DC | PRN

## 2016-05-25 MED ORDER — FENTANYL CITRATE (PF) 100 MCG/2ML IJ SOLN
INTRAMUSCULAR | Status: AC
  Filled 2016-05-25: qty 2

## 2016-05-25 MED ORDER — SODIUM CHLORIDE 0.9% FLUSH: 3.0000 mL | INTRAVENOUS | Status: DC | PRN

## 2016-05-25 MED ORDER — MIDAZOLAM HCL 2 MG/2ML IJ SOLN
INTRAMUSCULAR | Status: AC
  Filled 2016-05-25: qty 2

## 2016-05-25 MED ORDER — HEPARIN (PORCINE) IN NACL 2-0.9 UNIT/ML-% IJ SOLN
INTRAMUSCULAR | Status: DC | PRN
  Administered 2016-05-25: 1000 mL

## 2016-05-25 MED ORDER — HEPARIN (PORCINE) IN NACL 2-0.9 UNIT/ML-% IJ SOLN
INTRAMUSCULAR | Status: AC
  Filled 2016-05-25: qty 1000

## 2016-05-25 MED ORDER — FENTANYL CITRATE (PF) 100 MCG/2ML IJ SOLN
INTRAMUSCULAR | Status: DC | PRN
  Administered 2016-05-25 (×2): 25 ug via INTRAVENOUS

## 2016-05-25 MED ORDER — ONDANSETRON HCL 4 MG/2ML IJ SOLN: 4.0000 mg | Freq: Four times a day (QID) | INTRAMUSCULAR | Status: DC | PRN

## 2016-05-25 MED ORDER — ACETAMINOPHEN 325 MG PO TABS
650.0000 mg | ORAL_TABLET | ORAL | Status: DC | PRN
  Administered 2016-05-26: 650 mg via ORAL
  Filled 2016-05-25: qty 2

## 2016-05-25 MED ORDER — MIDAZOLAM HCL 2 MG/2ML IJ SOLN
INTRAMUSCULAR | Status: DC | PRN
  Administered 2016-05-25 (×2): 1 mg via INTRAVENOUS

## 2016-05-25 MED ORDER — HEPARIN BOLUS VIA INFUSION
2000.0000 [IU] | Freq: Once | INTRAVENOUS | Status: AC
  Administered 2016-05-25: 2000 [IU] via INTRAVENOUS
  Filled 2016-05-25: qty 2000

## 2016-05-25 MED ORDER — SODIUM CHLORIDE 0.9 % WEIGHT BASED INFUSION: 1.0000 mL/kg/h | INTRAVENOUS | Status: AC

## 2016-05-25 MED ORDER — IOPAMIDOL (ISOVUE-370) INJECTION 76%
INTRAVENOUS | Status: AC
  Filled 2016-05-25: qty 100

## 2016-05-25 MED ORDER — SODIUM CHLORIDE 0.9 % WEIGHT BASED INFUSION: 3.0000 mL/kg/h | INTRAVENOUS | Status: DC

## 2016-05-25 MED ORDER — SODIUM CHLORIDE 0.9 % WEIGHT BASED INFUSION: 1.0000 mL/kg/h | INTRAVENOUS | Status: DC

## 2016-05-25 MED ORDER — ASPIRIN 81 MG PO CHEW: 81.0000 mg | CHEWABLE_TABLET | ORAL | Status: DC

## 2016-05-25 MED ORDER — POTASSIUM CHLORIDE CRYS ER 20 MEQ PO TBCR: 40.0000 meq | EXTENDED_RELEASE_TABLET | Freq: Once | ORAL | Status: DC

## 2016-05-25 MED ORDER — LIDOCAINE HCL (PF) 1 % IJ SOLN
INTRAMUSCULAR | Status: AC
Start: 2016-05-25 — End: 2016-05-25
  Filled 2016-05-25: qty 30

## 2016-05-25 MED ORDER — ASPIRIN 81 MG PO CHEW
81.0000 mg | CHEWABLE_TABLET | ORAL | Status: AC
  Administered 2016-05-25: 81 mg via ORAL
  Filled 2016-05-25: qty 1

## 2016-05-25 MED ORDER — ENOXAPARIN SODIUM 40 MG/0.4ML ~~LOC~~ SOLN
40.0000 mg | SUBCUTANEOUS | Status: DC
  Administered 2016-05-26: 40 mg via SUBCUTANEOUS
  Filled 2016-05-25: qty 0.4

## 2016-05-25 MED ORDER — SODIUM CHLORIDE 0.9% FLUSH
3.0000 mL | Freq: Two times a day (BID) | INTRAVENOUS | Status: DC
  Administered 2016-05-25: 3 mL via INTRAVENOUS

## 2016-05-25 MED ORDER — HEPARIN (PORCINE) IN NACL 100-0.45 UNIT/ML-% IJ SOLN: 1150.0000 [IU]/h | INTRAMUSCULAR | Status: DC

## 2016-05-25 MED ORDER — LIDOCAINE HCL (PF) 1 % IJ SOLN
INTRAMUSCULAR | Status: DC | PRN
  Administered 2016-05-25: 20 mL

## 2016-05-25 MED FILL — Heparin Sodium (Porcine) 100 Unt/ML in Sodium Chloride 0.45%: INTRAMUSCULAR | Qty: 250 | Status: AC

## 2016-05-25 SURGICAL SUPPLY — 10 items
CATH INFINITI 5FR MULTPACK ANG (CATHETERS) ×1 IMPLANT
CATH SWAN GANZ 7F STRAIGHT (CATHETERS) ×1 IMPLANT
KIT HEART LEFT (KITS) ×2 IMPLANT
KIT HEART RIGHT NAMIC (KITS) ×1 IMPLANT
PACK CARDIAC CATHETERIZATION (CUSTOM PROCEDURE TRAY) ×2 IMPLANT
SHEATH PINNACLE 5F 10CM (SHEATH) ×1 IMPLANT
SHEATH PINNACLE 7F 10CM (SHEATH) ×1 IMPLANT
SYR MEDRAD MARK V 150ML (SYRINGE) ×2 IMPLANT
TRANSDUCER W/STOPCOCK (MISCELLANEOUS) ×3 IMPLANT
WIRE EMERALD 3MM-J .035X150CM (WIRE) ×1 IMPLANT

## 2016-05-25 NOTE — Progress Notes (Signed)
ANTICOAGULATION CONSULT NOTE - Follow-up Consult  Pharmacy Consult for Heparin Indication: NSTEMI  Allergies not on file  Patient Measurements: Height: 5\' 5"  (165.1 cm) Weight: 134 lb 3.2 oz (60.9 kg) IBW/kg (Calculated) : 57  Vital Signs: Temp: 98.4 F (36.9 C) (03/26 2152) Temp Source: Oral (03/26 2152) BP: 167/110 (03/26 2152) Pulse Rate: 76 (03/26 2152)  Labs:  Recent Labs  05/25/16 0010 05/25/16 0019  HGB 13.8  --   HCT 41.6  --   PLT 155  --   LABPROT 14.8  --   INR 1.15  --   HEPARINUNFRC  --  <0.10*  CREATININE 0.81  --   TROPONINI 0.89*  --     Estimated Creatinine Clearance: 75.6 mL/min (by C-G formula based on SCr of 0.81 mg/dL).   Medical History: No past medical history on file.  Medications:  Awaiting electronic med rec  Assessment: 51 y.o. F [facility] with [facility]. Heparin started with 4000 unit bolus and 920 units/hr ~1800 [facility]. To continue heparin here and plan for cath in a.m.  Heparin level undetectable on 900 units/hr. No issues with line or bleeding reported per RN.  Goal of Therapy:  Heparin level 0.3-0.7 units/ml Monitor platelets by anticoagulation protocol: Yes   Plan:  Rebolus heparin 2000 units Increase heparin to 1150 units/hr Will f/u 6 hr heparin level  Christoper Fabianaron Tomie Spizzirri, PharmD, BCPS Clinical pharmacist, pager [phone] 05/25/2016,2:42 AM

## 2016-05-25 NOTE — Progress Notes (Signed)
ACT at 1320: 142. Patient drowsy and unable to stay awake during conversation. VS before sheath pull: BP 133/78, HR 76 SR, RR 18, O2 sat 100% on room air. Pedal pulse +1. Arterial sheath pulled at 1335. Venous sheath pulled at 1345. Hemostasis achieved at 1355, bedrest time to begin at 1400 for 4 hours. Patient still very drowsy and will be taken up to Crystal Patterson room once she arouses more.

## 2016-05-25 NOTE — Progress Notes (Signed)
ANTICOAGULATION CONSULT NOTE - Initial Consult  Pharmacy Consult for Heparin Indication: NSTEMI  Allergies not on file  Patient Measurements: Height: 5\' 5"  (165.1 cm) Weight: 134 lb 3.2 oz (60.9 kg) IBW/kg (Calculated) : 57  Vital Signs: Temp: 98.4 F (36.9 C) (03/26 2152) Temp Source: Oral (03/26 2152) BP: 167/110 (03/26 2152) Pulse Rate: 76 (03/26 2152)  Labs: No results for input(s): HGB, HCT, PLT, APTT, LABPROT, INR, HEPARINUNFRC, HEPRLOWMOCWT, CREATININE, CKTOTAL, CKMB, TROPONINI in the last 72 hours.  CrCl cannot be calculated (Patient's most recent lab result is older than the maximum 21 days allowed.).   Medical History: No past medical history on file.  Medications:  Awaiting electronic med rec  Assessment: 51 y.o. F [facility]. Heparin started with 4000 unit bolus and 920 units/hr ~1800 [facility]. To continue heparin here and plan for cath in a.m.  Labs [facility]): SCr 0.78, Trop 0.47, INR 1.1, Hgb 13.6, Hcg 40.3, Plt 172  Goal of Therapy:  Heparin level 0.3-0.7 units/ml Monitor platelets by anticoagulation protocol: Yes   Plan:  Heparin 900 units/hr Will f/u stat HL Daily heparin level and CBC  Christoper Fabianaron Jadeyn Hargett, PharmD, BCPS Clinical pharmacist, pager [phone] 05/25/2016,12:09 AM

## 2016-05-25 NOTE — Interval H&P Note (Signed)
Cath Lab Visit (complete for each Cath Lab visit)  Clinical Evaluation Leading to the Procedure:   ACS: Yes.    Non-ACS:    Anginal Classification: CCS III  Anti-ischemic medical therapy: Maximal Therapy (2 or more classes of medications)  Non-Invasive Test Results: No non-invasive testing performed  Prior CABG: No previous CABG      History and Physical Interval Note:  05/25/2016 12:07 PM  Crystal Patterson  has presented today for surgery, with the diagnosis of CAD  The various methods of treatment have been discussed with the patient and family. After consideration of risks, benefits and other options for treatment, the patient has consented to  Procedure(s): Right/Left Heart Cath and Coronary Angiography (N/A) as a surgical intervention .  The patient's history has been reviewed, patient examined, no change in status, stable for surgery.  I have reviewed the patient's chart and labs.  Questions were answered to the patient's satisfaction.     Nicki Guadalajarahomas Kelly

## 2016-05-26 ENCOUNTER — Encounter (HOSPITAL_COMMUNITY): Payer: Self-pay | Admitting: Cardiovascular Disease

## 2016-05-26 ENCOUNTER — Inpatient Hospital Stay (HOSPITAL_COMMUNITY): Payer: Medicare Other

## 2016-05-26 DIAGNOSIS — R748 Abnormal levels of other serum enzymes: Secondary | ICD-10-CM

## 2016-05-26 DIAGNOSIS — I517 Cardiomegaly: Secondary | ICD-10-CM | POA: Diagnosis present

## 2016-05-26 DIAGNOSIS — I2510 Atherosclerotic heart disease of native coronary artery without angina pectoris: Secondary | ICD-10-CM

## 2016-05-26 DIAGNOSIS — I119 Hypertensive heart disease without heart failure: Secondary | ICD-10-CM

## 2016-05-26 LAB — CBC
HCT: 39.7 % (ref 36.0–46.0)
Hemoglobin: 13.1 g/dL (ref 12.0–15.0)
MCH: 30.9 pg (ref 26.0–34.0)
MCHC: 33 g/dL (ref 30.0–36.0)
MCV: 93.6 fL (ref 78.0–100.0)
Platelets: 197 K/uL (ref 150–400)
RBC: 4.24 MIL/uL (ref 3.87–5.11)
RDW: 12.1 % (ref 11.5–15.5)
WBC: 8.4 K/uL (ref 4.0–10.5)

## 2016-05-26 LAB — BASIC METABOLIC PANEL
Anion gap: 10 (ref 5–15)
BUN: 6 mg/dL (ref 6–20)
CO2: 27 mmol/L (ref 22–32)
Calcium: 8.8 mg/dL — ABNORMAL LOW (ref 8.9–10.3)
Chloride: 99 mmol/L — ABNORMAL LOW (ref 101–111)
Creatinine, Ser: 0.72 mg/dL (ref 0.44–1.00)
GFR calc Af Amer: >60 mL/min (ref >60)
GFR calc non Af Amer: >60 mL/min (ref >60)
Glucose, Bld: 108 mg/dL — ABNORMAL HIGH (ref 65–99)
Potassium: 3.1 mmol/L — ABNORMAL LOW (ref 3.5–5.1)
Sodium: 136 mmol/L (ref 135–145)

## 2016-05-26 LAB — HEMOGLOBIN A1C
Hgb A1c MFr Bld: 4.6 % — ABNORMAL LOW (ref 4.8–5.6)
Mean Plasma Glucose: 85 mg/dL

## 2016-05-26 LAB — ECHOCARDIOGRAM COMPLETE
Height: 65 in
Weight: 2208 oz

## 2016-05-26 MED ORDER — ASPIRIN 81 MG PO TBEC: 81.0000 mg | DELAYED_RELEASE_TABLET | Freq: Every day | ORAL | Status: DC

## 2016-05-26 MED ORDER — POTASSIUM CHLORIDE CRYS ER 20 MEQ PO TBCR
40.0000 meq | EXTENDED_RELEASE_TABLET | Freq: Once | ORAL | Status: AC
  Administered 2016-05-26: 40 meq via ORAL
  Filled 2016-05-26: qty 2

## 2016-05-26 MED ORDER — ATENOLOL 25 MG PO TABS: 25.0000 mg | ORAL_TABLET | Freq: Every day | ORAL | 11 refills | Status: DC

## 2016-05-26 MED ORDER — AMLODIPINE BESYLATE 10 MG PO TABS: 10.0000 mg | ORAL_TABLET | Freq: Every day | ORAL | 11 refills | Status: DC

## 2016-05-26 MED ORDER — FUROSEMIDE 40 MG PO TABS: 40.0000 mg | ORAL_TABLET | Freq: Every day | ORAL | 11 refills | Status: DC

## 2016-05-26 MED ORDER — BENAZEPRIL HCL 40 MG PO TABS: 40.0000 mg | ORAL_TABLET | Freq: Every day | ORAL | 11 refills | Status: DC

## 2016-05-26 MED ORDER — SPIRONOLACTONE 25 MG PO TABS: 25.0000 mg | ORAL_TABLET | Freq: Two times a day (BID) | ORAL | 11 refills | Status: DC

## 2016-05-26 MED ORDER — POTASSIUM CHLORIDE CRYS ER 10 MEQ PO TBCR: 10.0000 meq | EXTENDED_RELEASE_TABLET | Freq: Every day | ORAL | 3 refills | Status: DC

## 2016-05-26 MED ORDER — ALLOPURINOL 100 MG PO TABS: 100.0000 mg | ORAL_TABLET | Freq: Every day | ORAL | 3 refills | Status: DC

## 2016-05-26 MED ORDER — ATORVASTATIN CALCIUM 40 MG PO TABS: 40.0000 mg | ORAL_TABLET | Freq: Every day | ORAL | 11 refills | Status: DC

## 2016-05-26 NOTE — Progress Notes (Signed)
Echocardiogram 2D Echocardiogram has been performed.  Crystal Patterson 05/26/2016, 8:55 AM

## 2016-05-26 NOTE — Discharge Summary (Signed)
Discharge Summary    Patient ID: Crystal Patterson,  MRN: [id number], DOB/AGE: 50-Aug-1967 50 y.o.  Admit date: 05/24/2016 Discharge date: 05/26/2016  Primary Care Provider: METZ,CHRISTINE (Inactive) Primary Cardiologist: Will be Dr Harl Bowie in [place]  Discharge Diagnoses    Principal Problem:   Elevated troponin Active Problems:   HYPOKALEMIA   Essential hypertension   LVH (left ventricular hypertrophy)   Allergies No Known Allergies  Diagnostic Studies/Procedures    ECHO: 05/26/2016 - Left ventricle: Small LV cavity. Wall thickness was increased in a pattern of severe LVH. The estimated ejection fraction was 75%. Wall motion was normal; there were no regional wall motion abnormalities. Doppler parameters are consistent with abnormal left ventricular relaxation (grade 1 diastolic dysfunction). The E/e&' ratio is >20, suggesting markedly elevated LV fillingpressure. - Mitral valve: Calcified annulus. Mildly thickened leaflets . Mild stenosis. There was mild regurgitation. Valve area by pressure half-time: 2.18 cm^2. - Left atrium: Severely dilated. - Inferior vena cava: The vessel was normal in size. The respirophasic diameter changes were in the normal range (= 50%), consistent with normal central venous pressure. Impressions: - LVEF 75%, severely increased LV wall thickness with small LV cavity, grade 1 DD with high LV filling pressure, moderate MAC with mild mitral stenosis and mild regurgitation, severe LAE, normal IVC, no pericardial efusion. Consider global variant hypertrophic cardiomopathy given the degree of LV wall thickness and cardiac mass.  CATH: 05/25/2016  The left ventricular systolic function is normal.  LV end diastolic pressure is normal.  The left ventricular ejection fraction is greater than 65% by visual estimate. Hyperdynamic LV function with LVH and an ejection fraction of approximately 70%. Significant mitral  annular calcification. Normal coronary arteries. Mild elevation of right heart pressures. RECOMMENDATION: Medical therapy for chronic diastolic heart failure and hypertension. _____________   History of Present Illness      hx SLE with history of nephritis, Raynauds, smoking, extensive upper extremity peripheral vascular disease with ischemic injuries to hands and multiple amputated digits, was admitted 03/26 w/ CP.  Hospital Course     Consultants: none   Crystal Patterson remained pain free, but her enzymes were elevated. Her ECG was not acute. She was taken to the cath lab on 03/27, results above. She had no significant CAD, no spasm was seen. Medical management was recommended with good BP control being very important. Her LDL was 82, she will be on Lipitor 40 mg daily.  Initially, it was thought Crystal Patterson was off her medications only briefly. However, Crystal Patterson had not been taking BP medications in almost a year. She was restarted on most of her home medications. She was initially on hydralazine in the hospital, but it was discontinued at discharge. Her BP was trending down and she had only had a few doses of her meds. If her BP stabilizes and is still elevated, hydralazine can be started as an outpatient. Her Lasix dose was also decreased from her previous dose, but it may need to be up-titrated as an outpatient.  She was hypokalemic and hypomagnesemic on admission, both supplemented. This will also need to be followed as an outpatient. As she was not on Lasix PTA and K+ was 3.2, will discharge her on potassium supplement. Will not start Mag-Ox at this time, can do as outpatient if needed.  Her initial CXR at [facility] showed mild PNA and she was started on ceftriaxone. At discharge, this was changed to [place], to complete a 5 day course. Her BNP  at [facility] was elevated, but her CXR did not show significant edema. Oral Lasix was felt sufficient.  On 03/28, pt was seen by Dr Burt Knack and all  data were reviewed. Her echo showed LVH with high filling pressures and normal CVP. Her volume status will have to be followed, daily weights are encouraged. She was ambulating and felt at her baseline. No further inpatient workup was indicated and she is considered stable for discharge, to follow up as an outpatient in [place].  _____________  Discharge Vitals Blood pressure 105/81, pulse 69, temperature 98 F (36.7 C), temperature source Oral, resp. rate 19, height _0  (1.651 m), weight 138 lb (62.6 kg), SpO2 100 %.  Filed Weights   05/24/16 2152 05/25/16 0500 05/26/16 0448  Weight: 134 lb 3.2 oz (60.9 kg) 134 lb 4.2 oz (60.9 kg) 138 lb (62.6 kg)    Labs & Radiologic Studies    CBC  Recent Labs  05/25/16 0010  05/25/16 1532 05/26/16 0451  WBC 7.3  < > 7.9 8.4  NEUTROABS 4.3  --   --   --   HGB 13.8  < > 13.9 13.1  HCT 41.6  < > 42.2 39.7  MCV 94.3  < > 94.4 93.6  PLT 155  < > 197 197  < > = values in this interval not displayed. Basic Metabolic Panel  Recent Labs  05/25/16 0010 05/25/16 0543 05/25/16 1532 05/26/16 0451  NA 136 136  --  136  K 3.2* 3.3*  --  3.1*  CL 96* 98*  --  99*  CO2 29 26  --  27  GLUCOSE 92 105*  --  108*  BUN 5* 6  --  6  CREATININE 0.81 0.74 0.85 0.72  CALCIUM 8.9 9.0  --  8.8*  MG 1.4*  --   --   --    Liver Function Tests  Recent Labs  05/25/16 0010  AST 22  ALT 9*  ALKPHOS 60  BILITOT 1.9*  PROT 6.8  ALBUMIN 3.2*   Cardiac Enzymes  Recent Labs  05/25/16 0010 05/25/16 0543 05/25/16 1532  TROPONINI 0.89* 0.74* 1.23*   Hemoglobin A1C  Recent Labs  05/25/16 0010  HGBA1C 4.6*   Fasting Lipid Panel  Recent Labs  05/25/16 0543  CHOL 134  HDL 39*  LDLCALC 82  TRIG 63  CHOLHDL 3.4   Thyroid Function Tests  Recent Labs  05/25/16 0010  TSH 5.590*   _____________  No results found. Disposition   Pt is being discharged home today in good condition.  Follow-up Plans & Appointments    Follow-up  Information    Carlyle Dolly, MD Follow up on 06/02/2016.   Specialty:  Cardiology Why:  Please arrive at 2:15 pm for a 2:30 pm appt. Contact information: [place] [address] [phone]          Discharge Instructions    Amb Referral to Cardiac Rehabilitation    Complete by:  As directed    Diagnosis:  NSTEMI      Discharge Medications   There are no discharge medications for this patient.     Outstanding Labs/Studies   None  Duration of Discharge Encounter   Greater than 30 minutes including physician time.  Jonetta Speak NP 05/26/2016, 2:48 PM

## 2016-05-26 NOTE — Clinical Social Work Note (Signed)
CSW provided patient with information on [employer]: bus stops, fare prices, how to utilize, etc. No further needs indicated.  CSW signing off.   Charlynn CourtSarah Lanay Zinda, CSW [phone]

## 2016-05-26 NOTE — Progress Notes (Signed)
Progress Note  Patient Name: Crystal Patterson Date of Encounter: 05/26/2016  Primary Cardiologist: New, will f/u in [facility]  Patient Profile     51 y.o. female w/ hx SLE with history of nephritis, Raynauds, smoking, extensive upper extremity peripheral vascular disease with ischemic injuries to hands and multiple amputated digits, admitted 03/26 w/ CP>>ez mildly elevated>>cath w/ no sig dz, mildly elevated R heart pressures and nl EF. Echo w/ hyperdynamic EF, ?HOCM  Subjective   Pt did ok w/ ambulation. No chest pain. Minimal DOE, not a problem. Activity level is low at baseline.   Pt initially said rx filled at [facility], they have not seen her in over 6 months. Then said [facility], but last time rx was filled, it was over $200 and she could not afford it. [facility] said no rx over $8 on maintenance meds. Pt agrees to go back to [facility].  Inpatient Medications    Scheduled Meds: . allopurinol  100 mg Oral Daily  . amLODipine  10 mg Oral Daily  . aspirin EC  81 mg Oral Daily  . atenolol  25 mg Oral Daily  . atorvastatin  80 mg Oral q1800  . benazepril  40 mg Oral Daily  . cefTRIAXone (ROCEPHIN)  IV  1 g Intravenous Q24H  . enoxaparin (LOVENOX) injection  40 mg Subcutaneous Q24H  . furosemide  80 mg Oral Daily  . hydrALAZINE  100 mg Oral Q8H  . pantoprazole  40 mg Oral Daily  . potassium chloride  40 mEq Oral Once  . sodium chloride flush  3 mL Intravenous Q12H  . sodium chloride flush  3 mL Intravenous Q12H  . spironolactone  25 mg Oral BID   Continuous Infusions: . nitroGLYCERIN Stopped (05/25/16 1500)   PRN Meds: sodium chloride, sodium chloride, acetaminophen, nitroGLYCERIN, ondansetron (ZOFRAN) IV, ondansetron (ZOFRAN) IV, sodium chloride flush, sodium chloride flush   Vital Signs    Vitals:   05/25/16 2009 05/25/16 2215 05/26/16 0448 05/26/16 1337  BP:  135/78 119/75 105/81  Pulse:   74 69  Resp:   15 19  Temp: 97.9 F (36.6 C)  97.8 F (36.6 C) 98 F  (36.7 C)  TempSrc: Axillary  Oral Oral  SpO2:   100% 100%  Weight:   138 lb (62.6 kg)   Height:        Intake/Output Summary (Last 24 hours) at 05/26/16 1339 Last data filed at 05/26/16 1302  Gross per 24 hour  Intake           549.43 ml  Output              300 ml  Net           249.43 ml   Filed Weights   05/24/16 2152 05/25/16 0500 05/26/16 0448  Weight: 134 lb 3.2 oz (60.9 kg) 134 lb 4.2 oz (60.9 kg) 138 lb (62.6 kg)    Telemetry    SR, occ PACs, PVCs - Personally Reviewed  ECG    N/A - Personally Reviewed  Physical Exam   General: Well developed, well nourished, female appearing in no acute distress. Head: Normocephalic, atraumatic.  Neck: Supple without bruits, JVD not elevated. Lungs:  Resp regular and unlabored, slightly decreased BS bases but clear. Heart: RRR, S1, S2, no S3, S4, or murmur; no rub. Abdomen: Soft, non-tender, non-distended with normoactive bowel sounds. No hepatomegaly. No rebound/guarding. No obvious abdominal masses. Extremities: No clubbing, cyanosis, no edema. Distal radial pulses are  2+ bilaterally. Unable to palpate pedal pulses. R groin cath site without ecchymosis or hematoma. Site is tender, unable to auscultate 2nd pain. Neuro: Alert and oriented X 3. Moves all extremities spontaneously.  Labs    Hematology  Recent Labs Lab 05/25/16 0543 05/25/16 1532 05/26/16 0451  WBC 9.0 7.9 8.4  RBC 4.41 4.47 4.24  HGB 13.5 13.9 13.1  HCT 41.2 42.2 39.7  MCV 93.4 94.4 93.6  MCH 30.6 31.1 30.9  MCHC 32.8 32.9 33.0  RDW 11.9 12.1 12.1  PLT 198 197 197    Chemistry  Recent Labs Lab 05/25/16 0010 05/25/16 0543 05/25/16 1532 05/26/16 0451  NA 136 136  --  136  K 3.2* 3.3*  --  3.1*  CL 96* 98*  --  99*  CO2 29 26  --  27  GLUCOSE 92 105*  --  108*  BUN 5* 6  --  6  CREATININE 0.81 0.74 0.85 0.72  CALCIUM 8.9 9.0  --  8.8*  PROT 6.8  --   --   --   ALBUMIN 3.2*  --   --   --   AST 22  --   --   --   ALT 9*  --   --   --     ALKPHOS 60  --   --   --   BILITOT 1.9*  --   --   --   GFRNONAA >60 >60 >60 >60  GFRAA >60 >60 >60 >60  ANIONGAP 11 12  --  10     Cardiac Enzymes  Recent Labs Lab 05/25/16 0010 05/25/16 0543 05/25/16 1532  TROPONINI 0.89* 0.74* 1.23*   BNP  Recent Labs Lab 05/25/16 0010  BNP 399.5*     Radiology    No results found.   Cardiac Studies   ECHO: 05/26/2016 - Left ventricle: Small LV cavity. Wall thickness was increased in   a pattern of severe LVH. The estimated ejection fraction was 75%.   Wall motion was normal; there were no regional wall motion   abnormalities. Doppler parameters are consistent with abnormal   left ventricular relaxation (grade 1 diastolic dysfunction). The   E/e&' ratio is >20, suggesting markedly elevated LV filling pressure. - Mitral valve: Calcified annulus. Mildly thickened leaflets . Mild   stenosis. There was mild regurgitation. Valve area by pressure   half-time: 2.18 cm^2. - Left atrium: Severely dilated. - Inferior vena cava: The vessel was normal in size. The   respirophasic diameter changes were in the normal range (= 50%),   consistent with normal central venous pressure. Impressions: - LVEF 75%, severely increased LV wall thickness with small LV   cavity, grade 1 DD with high LV filling pressure, moderate MAC   with mild mitral stenosis and mild regurgitation, severe LAE,   normal IVC, no pericardial efusion. Consider global variant   hypertrophic cardiomopathy given the degree of LV wall thickness   and cardiac mass.  CATH: 05/25/2016  The left ventricular systolic function is normal.  LV end diastolic pressure is normal.  The left ventricular ejection fraction is greater than 65% by visual estimate.  Hyperdynamic LV function with LVH and an ejection fraction of approximately 70%.  Significant mitral annular calcification. Normal coronary arteries. Mild elevation of right heart pressures. RECOMMENDATION: Medical  therapy for chronic diastolic heart failure and hypertension.   Patient Profile     51 y.o. female w/ hx SLE with history of nephritis, Raynauds, smoking, extensive upper  extremity peripheral vascular disease with ischemic injuries to hands and multiple amputated digits, admitted 03/26 w/ CP>>ez mildly elevated>>cath w/ no sig dz, mildly elevated R heart pressures and nl EF. Echo w/ hyperdynamic EF, ?HOCM.  Assessment & Plan    Principal Problem:   Elevated troponin - Pt had chest pain but no sig CAD at cath, no spasm seen but pt has hx Raynaud's - sx may have been 2nd poorly controlled HTN - not on rx consistently PTA - EF normal at echo w/ LVH and elevated filling pressures.  - also w/ possible HOCM variant, so not sure how dry we should get her.   Active Problems:   HYPOKALEMIA -  40 meq ordered but not given, will give - if on diuretic at d/c will need daily dosing - if not, consider K+ rich foods    Hypomagnesemia - pt was supplemented on admission, IV - not currently on po rx - may need at d/c, consider 200 mg qd Mag-Ox    Essential hypertension, uncontrolled - Not on rx PTA  - all rx is new, will send to [facility]    LVH (left ventricular hypertrophy) - follow on HTN rx  Plan: define d/c meds and discharge home, outpt f/u in [place] arranged.  Jonetta Speak , PA-C 1:39 PM 05/26/2016 Pager: [phone]  Patient seen, examined. Available data reviewed. Agree with findings, assessment, and plan as outlined by Rosaria Ferries, PA-C. A&Ox3, NAD, JVP normal, lungs CTA, CV RRR without murmur, extremities without edema, finger amputations noted.   Cardiac cath and echo findings noted. Pt with severe LVH. Suspect hypertensive heart disease based on her history. Considering benign findings at cath with normal coronary arteries and normal LVEDP, I think stable for discharge home today. Biggest issue will be long-term medical compliance. Reviewed importance of  adherence with her home BP medications. Will stop hydralazine as I think it is unlikely she will be able to adhere to a TID dosing schedule. Other medications outlined above and personally reviewed. Coronary vasospasm and supply/demand mismatch from severe HTN most likely etiologies of troponin elevation. She is completely asymptomatic now with no chest pain or shortness of breath. FU in [place] with Dr Harl Bowie as scheduled.   Sherren Mocha, M.D. 05/26/2016 4:27 PM

## 2016-05-26 NOTE — Progress Notes (Signed)
CARDIAC REHAB PHASE I   PRE:  Rate/Rhythm: 73 SR  BP:  Sitting: 116/81        SaO2: 100 RA  MODE:  Ambulation: 150 ft   POST:  Rate/Rhythm: 83 SR  BP:  Sitting: 116/76         SaO2: 100 RA  Pt ambulated 150 ft on RA, rolling walker, assist x1, mildly unsteady gait, tolerated fairly well. Pt c/o headache, mild dizziness, denies any other complaints, declined rest stop. Pt states she lives alone but has a sister who checks on her daily, flat affect, seems somewhat slow to process information. When asked what the doctors told her about her disease process she states "I don't know." Briefly reviewed tobacco cessation (gave pt fake cigarette), CHF booklet and zone tool, daily weights, heart healthy and low sodium diet handouts. Difficult to assess pt's understanding, pt just states "I don't think I had a heart attack." Discussed phase 2 cardiac rehab, pt expressed interest if insurance will cover. Will send referral to [facility]. Pt may also benefit from HHPT/RN for disease management if she qualifies. Pt to recliner after walk, call bell within reach. Will follow.  [phone] Joylene GrapesEmily C Anaelle Dunton, RN, BSN 05/26/2016 12:13 PM

## 2016-05-26 NOTE — Care Management Note (Signed)
Case Management Note  Patient Details  Name: Crystal Patterson MRN: [id number] Date of Birth: 1965-11-05  Subjective/Objective:  Elevated troponin, hypokalemia, HTN, LVH                  Action/Plan: Discharge Planning: AVS reviewed: [facility] to pt and [facility]. Pt has cane at home. [facility]. Contacted [facility] DME rep for RW with seat. Explained to pt she will need to purchase a RW without seat to use in the home. Pt states walking long distances and standing for long periods are difficult. Requesting RW with seat. NCM contacted attending for HH/DME orders. Contacted CSW for information on Transportation. [facility]. Encouraged her to follow up with applying for Medicaid. Pt may benefit from having an aide come in a few hours per day.   PCP Erle CrockerMETZ, CHRISTINE     Expected Discharge Date:  05/26/16               Expected Discharge Plan:  Home w Home Health Services  In-House Referral:  Clinical Social Work  Discharge planning Services  CM Consult  Post Acute Care Choice:  Home Health Choice offered to:  Patient  DME Arranged:  Walker rolling with seat DME Agency:  [facility]  [facility]  [facility]  [facility]  Status of Service:  Completed, signed off  If discussed at Long Length of Stay Meetings, dates discussed:    Additional Comments:  Crystal Patterson, Crystal Kleinfelter Ellen, RN 05/26/2016, 4:07 PM

## 2016-05-26 NOTE — Discharge Instructions (Signed)

## 2016-06-01 DIAGNOSIS — I69398 Other sequelae of cerebral infarction: Secondary | ICD-10-CM | POA: Diagnosis not present

## 2016-06-02 ENCOUNTER — Ambulatory Visit (INDEPENDENT_AMBULATORY_CARE_PROVIDER_SITE_OTHER): Payer: Medicare Other | Admitting: Cardiology

## 2016-06-02 ENCOUNTER — Encounter: Payer: Self-pay | Admitting: Cardiology

## 2016-06-02 VITALS — BP 139/87 | HR 67 | Ht 64.0 in | Wt 136.4 lb

## 2016-06-02 DIAGNOSIS — I739 Peripheral vascular disease, unspecified: Secondary | ICD-10-CM

## 2016-06-02 DIAGNOSIS — I10 Essential (primary) hypertension: Secondary | ICD-10-CM

## 2016-06-02 DIAGNOSIS — R0789 Other chest pain: Secondary | ICD-10-CM | POA: Diagnosis not present

## 2016-06-02 MED ORDER — NICOTINE 7 MG/24HR TD PT24: MEDICATED_PATCH | TRANSDERMAL | 0 refills | Status: DC

## 2016-06-02 MED ORDER — NICOTINE 14 MG/24HR TD PT24: MEDICATED_PATCH | TRANSDERMAL | 0 refills | Status: DC

## 2016-06-02 NOTE — Patient Instructions (Addendum)
Your physician wants you to follow-up in: 6 MONTHS WITH DR [facility] You will receive a reminder letter in the mail two months in advance. If you don't receive a letter, please call our office to schedule the follow-up appointment.  Your physician has recommended you make the following change in your medication:   START NICOTINE PATCH 14 MG DAILY FOR 6 WEEKS  THEN START NICOTINE PATCH 7 MG DAILY FOR 2 WEEKS  Your physician recommends that you return for lab work BMP/MG  Thank you for choosing [facility]!!

## 2016-06-02 NOTE — Progress Notes (Signed)
Clinical Summary Ms. Berke is a 51 y.o.female seen for hospital follow up, this is our first visit together.  1. Chest pain - admit 04/2016 with NSTEMI, cath without significant CAD. Peak trop 1.23 - echo 04/2016 LVEF 75%, no WMAs. Grade I diastolic dysfunction. Elevated LA pressure. Severe LAE. Some question about diffuse hypertrophic CM.  - cath 04/2016: no significant CAD. RHC mean PA 20, PCWP 12,   - no recent chest pain. No SOB or DOE - compliant with meds  2. PAD - history of upper extremity PAD, Raynauds. Previous digit amputations - no recent pain.    3. HTN - recent poor medication compliance - since discharge reports medication compliance.    4. Tobacco abuse - continues to smoke  Past Medical History:  Diagnosis Date  . Elevated troponin 05/24/2016     No Known Allergies   Current Outpatient Prescriptions  Medication Sig Dispense Refill  . allopurinol (ZYLOPRIM) 100 MG tablet Take 1 tablet (100 mg total) by mouth daily. 30 tablet 3  . amLODipine (NORVASC) 10 MG tablet Take 1 tablet (10 mg total) by mouth daily. 30 tablet 11  . aspirin EC 81 MG EC tablet Take 1 tablet (81 mg total) by mouth daily.    [name] atenolol (TENORMIN) 25 MG tablet Take 1 tablet (25 mg total) by mouth daily. 30 tablet 11  . atorvastatin (LIPITOR) 40 MG tablet Take 1 tablet (40 mg total) by mouth daily at 6 PM. 30 tablet 11  . benazepril (LOTENSIN) 40 MG tablet Take 1 tablet (40 mg total) by mouth daily. 30 tablet 11  . furosemide (LASIX) 40 MG tablet Take 1 tablet (40 mg total) by mouth daily. 30 tablet 11  . potassium chloride SA (K-DUR,KLOR-CON) 10 MEQ tablet Take 1 tablet (10 mEq total) by mouth daily. 30 tablet 3  . spironolactone (ALDACTONE) 25 MG tablet Take 1 tablet (25 mg total) by mouth 2 (two) times daily. 30 tablet 11   No current facility-administered medications for this visit.      Past Surgical History:  Procedure Laterality Date  . RIGHT/LEFT HEART CATH AND CORONARY  ANGIOGRAPHY N/A 05/25/2016   Procedure: Right/Left Heart Cath and Coronary Angiography;  Surgeon: Troy Sine, MD;  Location: [facility];  Service: Cardiovascular;  Laterality: N/A;     No Known Allergies    No family history on file.   Social History Ms. Mcadoo reports that she has never smoked. She has never used smokeless tobacco. Ms. Gordin reports that she does not drink alcohol.   Review of Systems CONSTITUTIONAL: No weight loss, fever, chills, weakness or fatigue.  HEENT: Eyes: No visual loss, blurred vision, double vision or yellow sclerae.No hearing loss, sneezing, congestion, runny nose or sore throat.  SKIN: No rash or itching.  CARDIOVASCULAR: per hpi RESPIRATORY: No shortness of breath, cough or sputum.  GASTROINTESTINAL: No anorexia, nausea, vomiting or diarrhea. No abdominal pain or blood.  GENITOURINARY: No burning on urination, no polyuria NEUROLOGICAL: No headache, dizziness, syncope, paralysis, ataxia, numbness or tingling in the extremities. No change in bowel or bladder control.  MUSCULOSKELETAL: No muscle, back pain, joint pain or stiffness.  LYMPHATICS: No enlarged nodes. No history of splenectomy.  PSYCHIATRIC: No history of depression or anxiety.  ENDOCRINOLOGIC: No reports of sweating, cold or heat intolerance. No polyuria or polydipsia.  [name]   Physical Examination Vitals:   06/02/16 1415 06/02/16 1423  BP: (!) 142/92 139/87  Pulse: 68 67  Vitals:   06/02/16 1415  Weight: 136 lb 6.4 oz (61.9 kg)  Height: _0  (1.626 m)    Gen: resting comfortably, no acute distress HEENT: no scleral icterus, pupils equal round and reactive, no palptable cervical adenopathy,  CV: RRR, no m/r/g, no jvd Resp: Clear to auscultation bilaterally GI: abdomen is soft, non-tender, non-distended, normal bowel sounds, no hepatosplenomegaly MSK: extremities are warm, no edema.  Skin: warm, no rash Neuro:  no focal deficits Psych: appropriate  affect   Diagnostic Studies 04/2016 echo Study Conclusions  - Left ventricle: Small LV cavity. Wall thickness was increased in   a pattern of severe LVH. The estimated ejection fraction was 75%.   Wall motion was normal; there were no regional wall motion   abnormalities. Doppler parameters are consistent with abnormal   left ventricular relaxation (grade 1 diastolic dysfunction). The   E/e&' ratio is >20, suggesting markedly elevated LV filling   pressure. - Mitral valve: Calcified annulus. Mildly thickened leaflets . Mild   stenosis. There was mild regurgitation. Valve area by pressure   half-time: 2.18 cm^2. - Left atrium: Severely dilated. - Inferior vena cava: The vessel was normal in size. The   respirophasic diameter changes were in the normal range (= 50%),   consistent with normal central venous pressure.  Impressions:  - LVEF 75%, severely increased LV wall thickness with small LV   cavity, grade 1 DD with high LV filling pressure, moderate MAC   with mild mitral stenosis and mild regurgitation, severe LAE,   normal IVC, no pericardial efusion. Consider global variant   hypertrophic cardiomopathy given the degree of LV wall thickness   and cardiac mass.   04/2016 Cath Dominance: Right  Left Main  Vessel was injected. Vessel is normal in caliber. Vessel is angiographically normal.  Left Anterior Descending  Vessel was injected. Vessel is normal in caliber. Vessel is angiographically normal.  Ramus Intermedius  Vessel is small.  Left Circumflex  Vessel was injected. Vessel is normal in caliber. Vessel is angiographically normal.  Right Coronary Artery  Vessel was injected. Vessel is normal in caliber. Vessel is angiographically normal.  Right Heart   Right Heart Pressures RA: A 10, V 6; mean 5 RV: 36/7 PA: 33/13; mean 20 PW: a 24 V 17; mean 12  AO: 98/65 PA: 34/16  PW Mean 15 LV: 96/16  LV: 98/16 AO: 99/76  Oxygen saturation in the pulmonary  artery, 77% and in the aorta 99%.  Cardiac output by the thermodilution method 3.3, and by the Fick method 5.5 L/m. Cardiac index by the thermodilution method 2.0, and by the Fick method 3.3 L/m/m.      Assessment and Plan  1. Chest pain - negative cath recently - no recurrent symptoms - continue to monitor  2. PAD - continue secondary prevention  3. HTN - at goal, continue current meds  4. Tobacco abuse - will give rx for nicotine patches   Repeat BMET/Mg. F/u 6 months      Arnoldo Lenis, M.D.

## 2016-06-10 ENCOUNTER — Telehealth: Payer: Self-pay | Admitting: *Deleted

## 2016-06-10 DIAGNOSIS — I10 Essential (primary) hypertension: Secondary | ICD-10-CM

## 2016-06-10 NOTE — Telephone Encounter (Signed)
-----   Message from Antoine Poche, MD sent at 06/08/2016 10:45 AM EDT ----- Potassium and Mg are low. Confirm she is taking KCl daily, and have her increase to bid dosing. Take magnesium oxide  daily x 4 days. Repeat BMET/Mg in 3 weeks    JBranch MD

## 2016-06-10 NOTE — Telephone Encounter (Signed)
Pt daughter (DPR) voiced understanding - confirmed pt was taking KCl 10 daily - will increase to bid alond with 4 days of mag - will mail pt lab orders - routed to pcp

## 2016-09-29 DIAGNOSIS — Z7982 Long term (current) use of aspirin: Secondary | ICD-10-CM | POA: Diagnosis not present

## 2016-09-29 DIAGNOSIS — Z79899 Other long term (current) drug therapy: Secondary | ICD-10-CM | POA: Diagnosis not present

## 2016-09-29 DIAGNOSIS — M328 Other forms of systemic lupus erythematosus: Secondary | ICD-10-CM | POA: Diagnosis not present

## 2016-09-29 DIAGNOSIS — I10 Essential (primary) hypertension: Secondary | ICD-10-CM | POA: Diagnosis not present

## 2016-09-29 DIAGNOSIS — M109 Gout, unspecified: Secondary | ICD-10-CM | POA: Diagnosis not present

## 2016-09-29 DIAGNOSIS — M329 Systemic lupus erythematosus, unspecified: Secondary | ICD-10-CM | POA: Diagnosis not present

## 2016-10-02 DIAGNOSIS — Z7982 Long term (current) use of aspirin: Secondary | ICD-10-CM | POA: Diagnosis not present

## 2016-10-02 DIAGNOSIS — M25551 Pain in right hip: Secondary | ICD-10-CM | POA: Diagnosis not present

## 2016-10-02 DIAGNOSIS — Z8249 Family history of ischemic heart disease and other diseases of the circulatory system: Secondary | ICD-10-CM | POA: Diagnosis not present

## 2016-10-02 DIAGNOSIS — M7071 Other bursitis of hip, right hip: Secondary | ICD-10-CM | POA: Diagnosis not present

## 2016-10-02 DIAGNOSIS — I10 Essential (primary) hypertension: Secondary | ICD-10-CM | POA: Diagnosis not present

## 2016-10-02 DIAGNOSIS — Z79899 Other long term (current) drug therapy: Secondary | ICD-10-CM | POA: Diagnosis not present

## 2016-10-14 ENCOUNTER — Telehealth: Payer: Self-pay | Admitting: Physician Assistant

## 2016-10-14 NOTE — Telephone Encounter (Signed)
LMTCB

## 2016-10-14 NOTE — Telephone Encounter (Signed)
Nurse Whitney notified.

## 2016-10-14 NOTE — Telephone Encounter (Signed)
Wants to know if pt diagnosis is CHF? If so,what is her last EJection Fracrion?

## 2016-10-24 DIAGNOSIS — K219 Gastro-esophageal reflux disease without esophagitis: Secondary | ICD-10-CM | POA: Diagnosis not present

## 2016-10-24 DIAGNOSIS — I10 Essential (primary) hypertension: Secondary | ICD-10-CM | POA: Diagnosis not present

## 2016-10-24 DIAGNOSIS — M79644 Pain in right finger(s): Secondary | ICD-10-CM | POA: Diagnosis not present

## 2016-10-24 DIAGNOSIS — M329 Systemic lupus erythematosus, unspecified: Secondary | ICD-10-CM | POA: Diagnosis not present

## 2016-10-24 DIAGNOSIS — Z7982 Long term (current) use of aspirin: Secondary | ICD-10-CM | POA: Diagnosis not present

## 2016-10-24 DIAGNOSIS — M79641 Pain in right hand: Secondary | ICD-10-CM | POA: Diagnosis not present

## 2016-10-24 DIAGNOSIS — Z79899 Other long term (current) drug therapy: Secondary | ICD-10-CM | POA: Diagnosis not present

## 2016-11-09 ENCOUNTER — Encounter (HOSPITAL_COMMUNITY): Payer: Self-pay

## 2016-11-09 ENCOUNTER — Emergency Department (HOSPITAL_COMMUNITY)
Admission: EM | Admit: 2016-11-09 | Discharge: 2016-11-09 | Disposition: A | Discharge status: Home / Self Care | Payer: Medicare Other | Attending: Emergency Medicine | Admitting: Emergency Medicine

## 2016-11-09 ENCOUNTER — Emergency Department (HOSPITAL_COMMUNITY): Payer: Medicare Other

## 2016-11-09 DIAGNOSIS — N189 Chronic kidney disease, unspecified: Secondary | ICD-10-CM | POA: Diagnosis not present

## 2016-11-09 DIAGNOSIS — M79644 Pain in right finger(s): Secondary | ICD-10-CM

## 2016-11-09 DIAGNOSIS — Z8739 Personal history of other diseases of the musculoskeletal system and connective tissue: Secondary | ICD-10-CM | POA: Diagnosis not present

## 2016-11-09 DIAGNOSIS — A1803 Tuberculosis of other bones: Secondary | ICD-10-CM | POA: Diagnosis not present

## 2016-11-09 DIAGNOSIS — S6991XA Unspecified injury of right wrist, hand and finger(s), initial encounter: Secondary | ICD-10-CM | POA: Diagnosis not present

## 2016-11-09 DIAGNOSIS — I10 Essential (primary) hypertension: Secondary | ICD-10-CM | POA: Diagnosis not present

## 2016-11-09 DIAGNOSIS — M329 Systemic lupus erythematosus, unspecified: Secondary | ICD-10-CM | POA: Diagnosis not present

## 2016-11-09 DIAGNOSIS — Z89029 Acquired absence of unspecified finger(s): Secondary | ICD-10-CM | POA: Diagnosis not present

## 2016-11-09 DIAGNOSIS — F17200 Nicotine dependence, unspecified, uncomplicated: Secondary | ICD-10-CM | POA: Diagnosis not present

## 2016-11-09 DIAGNOSIS — I7300 Raynaud's syndrome without gangrene: Secondary | ICD-10-CM | POA: Diagnosis not present

## 2016-11-09 DIAGNOSIS — E785 Hyperlipidemia, unspecified: Secondary | ICD-10-CM | POA: Diagnosis not present

## 2016-11-09 DIAGNOSIS — J45909 Unspecified asthma, uncomplicated: Secondary | ICD-10-CM | POA: Diagnosis not present

## 2016-11-09 HISTORY — DX: Systemic lupus erythematosus, unspecified: M32.9

## 2016-11-09 HISTORY — DX: Complete traumatic metacarpophalangeal amputation of unspecified finger, initial encounter: S68.119A

## 2016-11-09 HISTORY — DX: Unspecified osteoarthritis, unspecified site: M19.90

## 2016-11-09 HISTORY — DX: Essential (primary) hypertension: I10

## 2016-11-09 HISTORY — DX: Reserved for concepts with insufficient information to code with codable children: IMO0002

## 2016-11-09 MED ORDER — BENAZEPRIL HCL 40 MG PO TABS
40.0000 mg | ORAL_TABLET | Freq: Every day | ORAL | Status: DC
  Administered 2016-11-09: 40 mg via ORAL
  Filled 2016-11-09 (×2): qty 1

## 2016-11-09 MED ORDER — ATENOLOL 25 MG PO TABS
25.0000 mg | ORAL_TABLET | Freq: Once | ORAL | Status: AC
  Administered 2016-11-09: 25 mg via ORAL
  Filled 2016-11-09: qty 1

## 2016-11-09 MED ORDER — MORPHINE SULFATE (PF) 4 MG/ML IV SOLN
4.0000 mg | Freq: Once | INTRAVENOUS | Status: AC
  Administered 2016-11-09: 4 mg via INTRAMUSCULAR
  Filled 2016-11-09: qty 1

## 2016-11-09 MED ORDER — OXYCODONE-ACETAMINOPHEN 5-325 MG PO TABS
1.0000 | ORAL_TABLET | Freq: Once | ORAL | Status: AC
  Administered 2016-11-09: 1 via ORAL
  Filled 2016-11-09: qty 1

## 2016-11-09 MED ORDER — BENAZEPRIL HCL 5 MG PO TABS
ORAL_TABLET | ORAL | Status: AC
  Filled 2016-11-09: qty 1

## 2016-11-09 MED ORDER — AMLODIPINE BESYLATE 5 MG PO TABS
10.0000 mg | ORAL_TABLET | Freq: Once | ORAL | Status: AC
  Administered 2016-11-09: 10 mg via ORAL
  Filled 2016-11-09: qty 2

## 2016-11-09 NOTE — ED Provider Notes (Signed)
[facility] Provider Note   CSN: [id number] Arrival date & time: 11/09/16  1609     History   Chief Complaint Chief Complaint  Patient presents with  . Fall  . Finger Injury    HPI Crystal Patterson is a 51 y.o. 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 The pain worsened today when she fell out of wheelchair and hit her finger. She denies other injuries. Patient has been taking hydrocodone for her pain without relief. It has been progressively worse. She has not seen her hand surgeon at [facility] for this problem yet. She has had all her other medications done there. She denies any other new symptoms. She reports she was at her primary care provider's office today, however they didn't evaluate her finger. That is where she fell out of the wheelchair that was not locked.  HPI  Past Medical History:  Diagnosis Date  . Arthritis   . Elevated troponin 05/24/2016  . Finger amputation, no complication   . Hypertension   . Lupus     Patient Active Problem List   Diagnosis Date Noted  . LVH (left ventricular hypertrophy) 05/26/2016  . Elevated troponin 05/24/2016  . HYPOKALEMIA 08/19/2008  . FATTY LIVER DISEASE 05/09/2008  . WEIGHT LOSS 03/28/2008  . RAYNAUDS SYNDROME 02/15/2008  . CARDIAC MURMUR 11/16/2007  . SYSTEMIC LUPUS ERYTHEMATOSUS 05/25/2006  . GOUT 02/02/2006  . DEPRESSION 02/02/2006  . MIGRAINE HEADACHE 02/02/2006  . Essential hypertension 02/02/2006  . ALLERGIC RHINITIS 02/02/2006  . ASTHMA 02/02/2006  . GERD 02/02/2006  . IBS 02/02/2006  . OVERACTIVE BLADDER 02/02/2006    Past Surgical History:  Procedure Laterality Date  . RIGHT/LEFT HEART CATH AND CORONARY ANGIOGRAPHY N/A 05/25/2016   Procedure: Right/Left Heart Cath and Coronary Angiography;  Surgeon: Lennette Biharihomas A Kelly, MD;  Location: [facility];  Service: Cardiovascular;  Laterality:  N/A;    OB History    No data available       Home Medications    Prior to Admission medications   Medication Sig Start Date End Date Taking? Authorizing Provider  allopurinol (ZYLOPRIM) 100 MG tablet Take 1 tablet (100 mg total) by mouth daily. 05/27/16   Barrett, Joline Salthonda G, PA-C  amLODipine (NORVASC) 10 MG tablet Take 1 tablet (10 mg total) by mouth daily. 05/27/16   Barrett, Joline Salthonda G, PA-C  aspirin EC 81 MG EC tablet Take 1 tablet (81 mg total) by mouth daily. 05/27/16   Barrett, Joline Salthonda G, PA-C  atenolol (TENORMIN) 25 MG tablet Take 1 tablet (25 mg total) by mouth daily. 05/27/16   Barrett, Joline Salthonda G, PA-C  atorvastatin (LIPITOR) 40 MG tablet Take 1 tablet (40 mg total) by mouth daily at 6 PM. 05/26/16   Barrett, Joline Salthonda G, PA-C  benazepril (LOTENSIN) 40 MG tablet Take 1 tablet (40 mg total) by mouth daily. 05/27/16   Barrett, Joline Salthonda G, PA-C  furosemide (LASIX) 40 MG tablet Take 1 tablet (40 mg total) by mouth daily. 05/27/16   Barrett, Joline Salthonda G, PA-C  magnesium oxide (MAG-OX) 400 MG tablet Take 400 mg by mouth daily. TAKE DAILY FOR 4 DAYS STARTING 06/10/16    [provider]  nicotine (NICODERM CQ - DOSED IN MG/24 HOURS) 14 mg/24hr patch PLACE 1 PATCH DAILY FOR 6 WEEKS 06/02/16   Antoine PocheBranch, Jonathan F, MD  nicotine (NICODERM CQ - DOSED IN MG/24 HR) 7 mg/24hr patch PLACE  ONE PATCH DAILY FOR 2 WEEKS 06/02/16   Antoine Poche, MD  potassium chloride (K-DUR) 10 MEQ tablet Take 10 mEq by mouth 2 (two) times daily.    [provider]  spironolactone (ALDACTONE) 25 MG tablet Take 1 tablet (25 mg total) by mouth 2 (two) times daily. 05/26/16   Barrett, Joline Salt, PA-C    Family History Family History  Problem Relation Age of Onset  . Pancreatic cancer Mother   . Colon cancer Father     Social History Social History  Substance Use Topics  . Smoking status: Current Every Day Smoker    Packs/day: 0.10  . Smokeless tobacco: Never Used  . Alcohol use No     Allergies   Patient  has no known allergies.   Review of Systems Review of Systems  Constitutional: Negative for fever.  Musculoskeletal: Positive for arthralgias (R index finger) and joint swelling.     Physical Exam Updated Vital Signs BP (!) 178/114 (BP Location: Left Arm)   Pulse 64   Temp 98 F (36.7 C) (Axillary)   Resp 18   Ht  (1.626 m)   Wt 61.7 kg (136 lb)   SpO2 100%   BMI 23.34 kg/m   Physical Exam  Constitutional: She appears well-developed and well-nourished. No distress.  HENT:  Head: Normocephalic and atraumatic.  Mouth/Throat: Oropharynx is clear and moist. No oropharyngeal exudate.  Eyes: Pupils are equal, round, and reactive to light. Conjunctivae are normal. Right eye exhibits no discharge. Left eye exhibits no discharge. No scleral icterus.  Neck: Normal range of motion. Neck supple. No thyromegaly present.  Cardiovascular: Normal rate, regular rhythm, normal heart sounds and intact distal pulses.  Exam reveals no gallop and no friction rub.   No murmur heard. Pulmonary/Chest: Effort normal and breath sounds normal. No stridor. No respiratory distress. She has no wheezes. She has no rales.  Musculoskeletal: She exhibits no edema.  Exquisite tenderness to right next finger, all aspects; edema noted; tenderness even to light touch; sensation intact; some blackening under nail, but could be dirt or dried blood; no other discoloration; range of motion limited due to swelling and pain  Lymphadenopathy:    She has no cervical adenopathy.  Neurological: She is alert. Coordination normal.  Skin: Skin is warm and dry. No rash noted. She is not diaphoretic. No pallor.  Psychiatric: She has a normal mood and affect.  Nursing note and vitals reviewed.    ED Treatments / Results  Labs (all labs ordered are listed, but only abnormal results are displayed) Labs Reviewed - No data to display  EKG  EKG Interpretation None       Radiology Dg Finger Index Right  Result  Date: 11/09/2016 CLINICAL DATA:  Pain in the right index finger for 2-3 weeks. Larey Seat out of the wheelchair. EXAM: RIGHT INDEX FINGER 2+V COMPARISON:  10/24/2016 FINDINGS: There is no acute fracture or subluxation. There is irregularity of the tuft, stable in appearance. Thinning of the soft tissues of the distal aspect of the digit, combined with soft tissue swelling of the proximal digit. 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 IMPRESSION: 1. Stable appearance of the index finger. 2. No acute fracture. Electronically Signed   By: Norva Pavlov M.D.   On: 11/09/2016 16:58    Procedures Procedures (including critical care time)  Medications Ordered in ED Medications  benazepril (LOTENSIN) tablet 40 mg (40 mg Oral Given 11/09/16 1845)  morphine 4 MG/ML injection 4 mg (  4 mg Intramuscular Given 11/09/16 1716)  amLODipine (NORVASC) tablet 10 mg (10 mg Oral Given 11/09/16 1758)  atenolol (TENORMIN) tablet 25 mg (25 mg Oral Given 11/09/16 1759)  oxyCODONE-acetaminophen (PERCOCET/ROXICET) 5-325 MG per tablet 1 tablet (1 tablet Oral Given 11/09/16 1758)  oxyCODONE-acetaminophen (PERCOCET/ROXICET) 5-325 MG per tablet 1 tablet (1 tablet Oral Given 11/09/16 1859)     Initial Impression / Assessment and Plan / ED Course  I have reviewed the triage vital signs and the nursing notes.  Pertinent labs & imaging results that were available during my care of the patient were reviewed by me and considered in my medical decision making (see chart for details).     Patient with 2-3 week history of pain to dactylitis. We will give IM morphine 4 mg for pain control.  On reevaluation, patient had no relief. We will give dose of Percocet prior to discharge as well as patient's daily fhypertension medications and she will not be able to get them until tomorrow.  X-ray of finger is stable from last month today. No acute sign of ischemia, no discoloration. Patient advised to follow-up with her hand surgeon as soon  as possible for further evaluation. She plans to attempt to see them tomorrow. Patient given IM morphine in the ED. Return precautions discussed. Patient understands and agrees to plan. Patient advised to take blood pressure medication when she gets home. Patient otherwise discharged in satisfactory condition. I discussed patient case with Dr. Jacqulyn Bath who guided the patient's management and agrees with plan.   Final Clinical Impressions(s) / ED Diagnoses   Final diagnoses:  Finger pain, right    New Prescriptions Discharge Medication List as of 11/09/2016  6:48 PM       Emi Holes, PA-C 11/09/16 1931    Long, Arlyss Repress, MD 11/10/16 1341

## 2016-11-09 NOTE — Discharge Instructions (Signed)
Please follow-up with your hand doctor as soon as possible for further evaluation and treatment. Please return to emergency department if you develop any new or worsening symptoms, such as darkening of your finger, loss of sensation, or any other new or concerning symptoms.

## 2016-11-09 NOTE — ED Triage Notes (Signed)
Patient reports of falling today due to wheelchair not locked. Complains of pain to right finger.   Has not taken BP medication today.

## 2016-11-10 DIAGNOSIS — W1839XA Other fall on same level, initial encounter: Secondary | ICD-10-CM | POA: Diagnosis not present

## 2016-11-10 DIAGNOSIS — M19031 Primary osteoarthritis, right wrist: Secondary | ICD-10-CM | POA: Diagnosis not present

## 2016-11-10 DIAGNOSIS — S68122A Partial traumatic metacarpophalangeal amputation of right middle finger, initial encounter: Secondary | ICD-10-CM | POA: Diagnosis not present

## 2016-11-10 DIAGNOSIS — M24159 Other articular cartilage disorders, unspecified hip: Secondary | ICD-10-CM | POA: Diagnosis not present

## 2016-11-10 DIAGNOSIS — M79644 Pain in right finger(s): Secondary | ICD-10-CM | POA: Diagnosis not present

## 2016-11-10 DIAGNOSIS — M898X5 Other specified disorders of bone, thigh: Secondary | ICD-10-CM | POA: Diagnosis not present

## 2016-11-10 DIAGNOSIS — M47897 Other spondylosis, lumbosacral region: Secondary | ICD-10-CM | POA: Diagnosis not present

## 2016-11-10 DIAGNOSIS — Y999 Unspecified external cause status: Secondary | ICD-10-CM | POA: Diagnosis not present

## 2016-11-10 DIAGNOSIS — S68124A Partial traumatic metacarpophalangeal amputation of right ring finger, initial encounter: Secondary | ICD-10-CM | POA: Diagnosis not present

## 2016-11-10 DIAGNOSIS — L98494 Non-pressure chronic ulcer of skin of other sites with necrosis of bone: Secondary | ICD-10-CM | POA: Diagnosis not present

## 2016-11-10 DIAGNOSIS — M533 Sacrococcygeal disorders, not elsewhere classified: Secondary | ICD-10-CM | POA: Diagnosis not present

## 2016-11-10 DIAGNOSIS — R2231 Localized swelling, mass and lump, right upper limb: Secondary | ICD-10-CM | POA: Diagnosis not present

## 2016-11-10 DIAGNOSIS — M1611 Unilateral primary osteoarthritis, right hip: Secondary | ICD-10-CM | POA: Diagnosis not present

## 2016-11-10 DIAGNOSIS — M19041 Primary osteoarthritis, right hand: Secondary | ICD-10-CM | POA: Diagnosis not present

## 2016-11-11 DIAGNOSIS — R2231 Localized swelling, mass and lump, right upper limb: Secondary | ICD-10-CM | POA: Diagnosis not present

## 2016-11-11 DIAGNOSIS — S68122A Partial traumatic metacarpophalangeal amputation of right middle finger, initial encounter: Secondary | ICD-10-CM | POA: Diagnosis not present

## 2016-11-11 DIAGNOSIS — M19041 Primary osteoarthritis, right hand: Secondary | ICD-10-CM | POA: Diagnosis not present

## 2016-11-11 DIAGNOSIS — M19031 Primary osteoarthritis, right wrist: Secondary | ICD-10-CM | POA: Diagnosis not present

## 2016-11-11 DIAGNOSIS — S68124A Partial traumatic metacarpophalangeal amputation of right ring finger, initial encounter: Secondary | ICD-10-CM | POA: Diagnosis not present

## 2016-11-15 DIAGNOSIS — Z9889 Other specified postprocedural states: Secondary | ICD-10-CM | POA: Diagnosis not present

## 2016-11-15 DIAGNOSIS — Z89022 Acquired absence of left finger(s): Secondary | ICD-10-CM | POA: Diagnosis not present

## 2016-11-15 DIAGNOSIS — Z89021 Acquired absence of right finger(s): Secondary | ICD-10-CM | POA: Diagnosis not present

## 2016-11-15 DIAGNOSIS — M329 Systemic lupus erythematosus, unspecified: Secondary | ICD-10-CM | POA: Diagnosis not present

## 2016-11-15 DIAGNOSIS — I7300 Raynaud's syndrome without gangrene: Secondary | ICD-10-CM | POA: Diagnosis not present

## 2016-11-15 DIAGNOSIS — I739 Peripheral vascular disease, unspecified: Secondary | ICD-10-CM | POA: Diagnosis not present

## 2016-11-15 DIAGNOSIS — Z79899 Other long term (current) drug therapy: Secondary | ICD-10-CM | POA: Diagnosis not present

## 2016-11-15 DIAGNOSIS — M19041 Primary osteoarthritis, right hand: Secondary | ICD-10-CM | POA: Diagnosis not present

## 2016-11-15 DIAGNOSIS — I10 Essential (primary) hypertension: Secondary | ICD-10-CM | POA: Diagnosis not present

## 2016-11-15 DIAGNOSIS — Z7982 Long term (current) use of aspirin: Secondary | ICD-10-CM | POA: Diagnosis not present

## 2016-11-15 DIAGNOSIS — L98494 Non-pressure chronic ulcer of skin of other sites with necrosis of bone: Secondary | ICD-10-CM | POA: Diagnosis not present

## 2016-11-15 DIAGNOSIS — M86142 Other acute osteomyelitis, left hand: Secondary | ICD-10-CM | POA: Diagnosis not present

## 2016-11-15 DIAGNOSIS — Z87448 Personal history of other diseases of urinary system: Secondary | ICD-10-CM | POA: Diagnosis not present

## 2016-11-26 DIAGNOSIS — L930 Discoid lupus erythematosus: Secondary | ICD-10-CM | POA: Diagnosis not present

## 2016-11-26 DIAGNOSIS — I739 Peripheral vascular disease, unspecified: Secondary | ICD-10-CM | POA: Diagnosis not present

## 2016-11-26 DIAGNOSIS — L98499 Non-pressure chronic ulcer of skin of other sites with unspecified severity: Secondary | ICD-10-CM | POA: Diagnosis not present

## 2016-11-26 DIAGNOSIS — M7989 Other specified soft tissue disorders: Secondary | ICD-10-CM | POA: Diagnosis not present

## 2016-11-26 DIAGNOSIS — M329 Systemic lupus erythematosus, unspecified: Secondary | ICD-10-CM | POA: Diagnosis not present

## 2016-11-29 DIAGNOSIS — R300 Dysuria: Secondary | ICD-10-CM | POA: Diagnosis not present

## 2016-11-29 DIAGNOSIS — I7300 Raynaud's syndrome without gangrene: Secondary | ICD-10-CM | POA: Diagnosis not present

## 2016-11-29 DIAGNOSIS — L03011 Cellulitis of right finger: Secondary | ICD-10-CM | POA: Diagnosis not present

## 2016-11-29 DIAGNOSIS — N10 Acute tubulo-interstitial nephritis: Secondary | ICD-10-CM | POA: Diagnosis not present

## 2016-11-29 DIAGNOSIS — N390 Urinary tract infection, site not specified: Secondary | ICD-10-CM | POA: Diagnosis not present

## 2016-12-02 DIAGNOSIS — I739 Peripheral vascular disease, unspecified: Secondary | ICD-10-CM | POA: Diagnosis not present

## 2016-12-02 DIAGNOSIS — L98494 Non-pressure chronic ulcer of skin of other sites with necrosis of bone: Secondary | ICD-10-CM | POA: Diagnosis not present

## 2016-12-02 DIAGNOSIS — Z7982 Long term (current) use of aspirin: Secondary | ICD-10-CM | POA: Diagnosis not present

## 2016-12-02 DIAGNOSIS — Z89201 Acquired absence of right upper limb, unspecified level: Secondary | ICD-10-CM | POA: Diagnosis not present

## 2016-12-02 DIAGNOSIS — I10 Essential (primary) hypertension: Secondary | ICD-10-CM | POA: Diagnosis not present

## 2016-12-02 DIAGNOSIS — K219 Gastro-esophageal reflux disease without esophagitis: Secondary | ICD-10-CM | POA: Diagnosis not present

## 2016-12-02 DIAGNOSIS — Z79899 Other long term (current) drug therapy: Secondary | ICD-10-CM | POA: Diagnosis not present

## 2016-12-02 DIAGNOSIS — M869 Osteomyelitis, unspecified: Secondary | ICD-10-CM | POA: Diagnosis not present

## 2016-12-02 DIAGNOSIS — I7300 Raynaud's syndrome without gangrene: Secondary | ICD-10-CM | POA: Diagnosis not present

## 2016-12-02 DIAGNOSIS — M109 Gout, unspecified: Secondary | ICD-10-CM | POA: Diagnosis not present

## 2016-12-02 DIAGNOSIS — I96 Gangrene, not elsewhere classified: Secondary | ICD-10-CM | POA: Diagnosis not present

## 2016-12-02 DIAGNOSIS — M3214 Glomerular disease in systemic lupus erythematosus: Secondary | ICD-10-CM | POA: Diagnosis not present

## 2016-12-02 DIAGNOSIS — M19041 Primary osteoarthritis, right hand: Secondary | ICD-10-CM | POA: Diagnosis not present

## 2016-12-03 DIAGNOSIS — Z9889 Other specified postprocedural states: Secondary | ICD-10-CM | POA: Diagnosis not present

## 2016-12-03 DIAGNOSIS — I10 Essential (primary) hypertension: Secondary | ICD-10-CM | POA: Diagnosis not present

## 2016-12-03 DIAGNOSIS — Z79899 Other long term (current) drug therapy: Secondary | ICD-10-CM | POA: Diagnosis not present

## 2016-12-03 DIAGNOSIS — M79644 Pain in right finger(s): Secondary | ICD-10-CM | POA: Diagnosis not present

## 2016-12-03 DIAGNOSIS — S68120A Partial traumatic metacarpophalangeal amputation of right index finger, initial encounter: Secondary | ICD-10-CM | POA: Diagnosis not present

## 2016-12-03 DIAGNOSIS — Z7982 Long term (current) use of aspirin: Secondary | ICD-10-CM | POA: Diagnosis not present

## 2016-12-05 DIAGNOSIS — I10 Essential (primary) hypertension: Secondary | ICD-10-CM | POA: Diagnosis not present

## 2016-12-05 DIAGNOSIS — I252 Old myocardial infarction: Secondary | ICD-10-CM | POA: Diagnosis not present

## 2016-12-05 DIAGNOSIS — R55 Syncope and collapse: Secondary | ICD-10-CM | POA: Diagnosis not present

## 2016-12-05 DIAGNOSIS — M79641 Pain in right hand: Secondary | ICD-10-CM | POA: Diagnosis not present

## 2016-12-05 DIAGNOSIS — M329 Systemic lupus erythematosus, unspecified: Secondary | ICD-10-CM | POA: Diagnosis not present

## 2016-12-05 DIAGNOSIS — Z7982 Long term (current) use of aspirin: Secondary | ICD-10-CM | POA: Diagnosis not present

## 2016-12-05 DIAGNOSIS — M1990 Unspecified osteoarthritis, unspecified site: Secondary | ICD-10-CM | POA: Diagnosis not present

## 2016-12-05 DIAGNOSIS — Z8249 Family history of ischemic heart disease and other diseases of the circulatory system: Secondary | ICD-10-CM | POA: Diagnosis not present

## 2016-12-05 DIAGNOSIS — I4581 Long QT syndrome: Secondary | ICD-10-CM | POA: Diagnosis not present

## 2016-12-05 DIAGNOSIS — Z79899 Other long term (current) drug therapy: Secondary | ICD-10-CM | POA: Diagnosis not present

## 2016-12-05 DIAGNOSIS — R9431 Abnormal electrocardiogram [ECG] [EKG]: Secondary | ICD-10-CM | POA: Diagnosis not present

## 2016-12-05 NOTE — Progress Notes (Signed)
This is a no charge note  Transfer from [facility] per Dr. Alroy Dust  51 year old lady with a past medical history of a stroke, hypertension, asthma, gout, anxiety, lupus, arthritis, migraine headaches, dCHF, who presents with syncope.  Pt had syncope episode when she was sitting at table and eating food. The episode lasted for about 5-10 min per family report. No seizure activity or focal neurologic signs. No chest pain, shortness or palpitation. No GI symptoms or symptoms for UTI. Pt had abnormal EKG, including shortened PR interval and questionable Delta wave. ST depression in lead II and ST elevation in V5 on 2 lead tele monitor, but 12 lead EKG did not show ST depression or ST elevation. EDP called Cardiology, Dr. Virgina Organ. He said if pt dose not have atrial arrhythmia, the EKG findings may not be related to syncope. Patient was found to have normal CBC, normal BMP, blood pressure 110/70, heart rate 70, normal respiration rate, normal temperature. Troponin negative. Patient is accepted to stepdown bed as inpatient.  Please call manager of [facility] at [phone] when pt arrives to floor   Lorretta Harp, MD  [facility] Pager [phone]  If 7PM-7AM, please contact night-coverage [email or web address] Password [email or web address] 12/05/2016, 9:51 PM

## 2016-12-06 ENCOUNTER — Observation Stay (HOSPITAL_COMMUNITY): Payer: Medicare Other

## 2016-12-06 ENCOUNTER — Observation Stay (HOSPITAL_BASED_OUTPATIENT_CLINIC_OR_DEPARTMENT_OTHER): Payer: Medicare Other

## 2016-12-06 ENCOUNTER — Observation Stay (HOSPITAL_COMMUNITY)
Admission: AD | Admit: 2016-12-06 | Discharge: 2016-12-07 | Disposition: A | Discharge status: Home / Self Care | Payer: Medicare Other | Source: Other Acute Inpatient Hospital | Attending: Internal Medicine | Admitting: Internal Medicine

## 2016-12-06 ENCOUNTER — Encounter (HOSPITAL_COMMUNITY): Payer: Self-pay

## 2016-12-06 DIAGNOSIS — I739 Peripheral vascular disease, unspecified: Secondary | ICD-10-CM | POA: Diagnosis not present

## 2016-12-06 DIAGNOSIS — R55 Syncope and collapse: Secondary | ICD-10-CM

## 2016-12-06 DIAGNOSIS — I517 Cardiomegaly: Secondary | ICD-10-CM | POA: Diagnosis present

## 2016-12-06 DIAGNOSIS — Z89021 Acquired absence of right finger(s): Secondary | ICD-10-CM | POA: Insufficient documentation

## 2016-12-06 DIAGNOSIS — F17210 Nicotine dependence, cigarettes, uncomplicated: Secondary | ICD-10-CM | POA: Diagnosis not present

## 2016-12-06 DIAGNOSIS — Z79899 Other long term (current) drug therapy: Secondary | ICD-10-CM | POA: Diagnosis not present

## 2016-12-06 DIAGNOSIS — I252 Old myocardial infarction: Secondary | ICD-10-CM | POA: Diagnosis not present

## 2016-12-06 DIAGNOSIS — F1010 Alcohol abuse, uncomplicated: Secondary | ICD-10-CM

## 2016-12-06 DIAGNOSIS — M109 Gout, unspecified: Secondary | ICD-10-CM | POA: Diagnosis not present

## 2016-12-06 DIAGNOSIS — I110 Hypertensive heart disease with heart failure: Secondary | ICD-10-CM | POA: Insufficient documentation

## 2016-12-06 DIAGNOSIS — M79641 Pain in right hand: Secondary | ICD-10-CM | POA: Diagnosis not present

## 2016-12-06 DIAGNOSIS — M329 Systemic lupus erythematosus, unspecified: Secondary | ICD-10-CM | POA: Diagnosis not present

## 2016-12-06 DIAGNOSIS — I10 Essential (primary) hypertension: Secondary | ICD-10-CM

## 2016-12-06 DIAGNOSIS — Z7982 Long term (current) use of aspirin: Secondary | ICD-10-CM | POA: Insufficient documentation

## 2016-12-06 DIAGNOSIS — R0902 Hypoxemia: Secondary | ICD-10-CM | POA: Diagnosis not present

## 2016-12-06 DIAGNOSIS — R52 Pain, unspecified: Secondary | ICD-10-CM

## 2016-12-06 DIAGNOSIS — I5032 Chronic diastolic (congestive) heart failure: Secondary | ICD-10-CM | POA: Diagnosis not present

## 2016-12-06 DIAGNOSIS — K219 Gastro-esophageal reflux disease without esophagitis: Secondary | ICD-10-CM | POA: Diagnosis not present

## 2016-12-06 DIAGNOSIS — K589 Irritable bowel syndrome without diarrhea: Secondary | ICD-10-CM | POA: Diagnosis present

## 2016-12-06 DIAGNOSIS — J45909 Unspecified asthma, uncomplicated: Secondary | ICD-10-CM | POA: Diagnosis present

## 2016-12-06 LAB — COMPREHENSIVE METABOLIC PANEL
ALT: 11 U/L — ABNORMAL LOW (ref 14–54)
AST: 22 U/L (ref 15–41)
Albumin: 3.4 g/dL — ABNORMAL LOW (ref 3.5–5.0)
Alkaline Phosphatase: 86 U/L (ref 38–126)
Anion gap: 10 (ref 5–15)
BUN: 6 mg/dL (ref 6–20)
CO2: 27 mmol/L (ref 22–32)
Calcium: 9.3 mg/dL (ref 8.9–10.3)
Chloride: 100 mmol/L — ABNORMAL LOW (ref 101–111)
Creatinine, Ser: 0.76 mg/dL (ref 0.44–1.00)
GFR calc Af Amer: >60 mL/min (ref >60)
GFR calc non Af Amer: >60 mL/min (ref >60)
Glucose, Bld: 78 mg/dL (ref 65–99)
Potassium: 3.6 mmol/L (ref 3.5–5.1)
Sodium: 137 mmol/L (ref 135–145)
Total Bilirubin: 1.5 mg/dL — ABNORMAL HIGH (ref 0.3–1.2)
Total Protein: 7.1 g/dL (ref 6.5–8.1)

## 2016-12-06 LAB — LACTIC ACID, PLASMA
Lactic Acid, Venous: 1.1 mmol/L (ref 0.5–1.9)
Lactic Acid, Venous: 2.1 mmol/L (ref 0.5–1.9)

## 2016-12-06 LAB — CBC
HCT: 39.3 % (ref 36.0–46.0)
Hemoglobin: 12.8 g/dL (ref 12.0–15.0)
MCH: 31.5 pg (ref 26.0–34.0)
MCHC: 32.6 g/dL (ref 30.0–36.0)
MCV: 96.8 fL (ref 78.0–100.0)
Platelets: 177 10*3/uL (ref 150–400)
RBC: 4.06 MIL/uL (ref 3.87–5.11)
RDW: 12.7 % (ref 11.5–15.5)
WBC: 8.9 10*3/uL (ref 4.0–10.5)

## 2016-12-06 LAB — MRSA PCR SCREENING: MRSA by PCR: NEGATIVE

## 2016-12-06 LAB — TSH: TSH: 1.839 u[IU]/mL (ref 0.350–4.500)

## 2016-12-06 LAB — ECHOCARDIOGRAM COMPLETE: Weight: 1873.03 oz

## 2016-12-06 LAB — TROPONIN I
Troponin I: <0.03 ng/mL (ref <0.03)
Troponin I: <0.03 ng/mL (ref <0.03)

## 2016-12-06 LAB — MAGNESIUM: Magnesium: 1.5 mg/dL — ABNORMAL LOW (ref 1.7–2.4)

## 2016-12-06 LAB — PROTIME-INR
INR: 0.98
Prothrombin Time: 12.9 seconds (ref 11.4–15.2)

## 2016-12-06 MED ORDER — ATENOLOL 25 MG PO TABS
25.0000 mg | ORAL_TABLET | Freq: Every day | ORAL | Status: DC
  Administered 2016-12-06 – 2016-12-07 (×2): 25 mg via ORAL
  Filled 2016-12-06 (×2): qty 1

## 2016-12-06 MED ORDER — SODIUM CHLORIDE 0.9% FLUSH
3.0000 mL | Freq: Two times a day (BID) | INTRAVENOUS | Status: DC
  Administered 2016-12-06: 3 mL via INTRAVENOUS

## 2016-12-06 MED ORDER — ADULT MULTIVITAMIN W/MINERALS CH
1.0000 | ORAL_TABLET | Freq: Every day | ORAL | Status: DC
  Administered 2016-12-06 – 2016-12-07 (×2): 1 via ORAL
  Filled 2016-12-06 (×2): qty 1

## 2016-12-06 MED ORDER — LORAZEPAM 1 MG PO TABS
1.0000 mg | ORAL_TABLET | Freq: Four times a day (QID) | ORAL | Status: DC | PRN
  Administered 2016-12-06: 1 mg via ORAL
  Filled 2016-12-06: qty 1

## 2016-12-06 MED ORDER — ENSURE ENLIVE PO LIQD
237.0000 mL | Freq: Two times a day (BID) | ORAL | Status: DC
  Administered 2016-12-06 – 2016-12-07 (×3): 237 mL via ORAL

## 2016-12-06 MED ORDER — SODIUM CHLORIDE 0.9 % IV SOLN
INTRAVENOUS | Status: AC
  Administered 2016-12-06: 12:00:00 via INTRAVENOUS

## 2016-12-06 MED ORDER — MORPHINE SULFATE (PF) 2 MG/ML IV SOLN
1.0000 mg | INTRAVENOUS | Status: DC | PRN
  Administered 2016-12-06: 1 mg via INTRAVENOUS
  Filled 2016-12-06: qty 1

## 2016-12-06 MED ORDER — FENTANYL CITRATE (PF) 100 MCG/2ML IJ SOLN
50.0000 ug | Freq: Once | INTRAMUSCULAR | Status: AC
  Administered 2016-12-06: 50 ug via INTRAVENOUS

## 2016-12-06 MED ORDER — VITAMIN B-1 100 MG PO TABS
100.0000 mg | ORAL_TABLET | Freq: Every day | ORAL | Status: DC
  Administered 2016-12-06 – 2016-12-07 (×2): 100 mg via ORAL
  Filled 2016-12-06 (×2): qty 1

## 2016-12-06 MED ORDER — THIAMINE HCL 100 MG/ML IJ SOLN: 100.0000 mg | Freq: Every day | INTRAMUSCULAR | Status: DC

## 2016-12-06 MED ORDER — ALLOPURINOL 100 MG PO TABS
100.0000 mg | ORAL_TABLET | Freq: Every day | ORAL | Status: DC
  Administered 2016-12-06 – 2016-12-07 (×2): 100 mg via ORAL
  Filled 2016-12-06 (×2): qty 1

## 2016-12-06 MED ORDER — ACETAMINOPHEN 325 MG PO TABS: 650.0000 mg | ORAL_TABLET | Freq: Four times a day (QID) | ORAL | Status: DC | PRN

## 2016-12-06 MED ORDER — ONDANSETRON HCL 4 MG/2ML IJ SOLN: 4.0000 mg | Freq: Four times a day (QID) | INTRAMUSCULAR | Status: DC | PRN

## 2016-12-06 MED ORDER — AMLODIPINE BESYLATE 10 MG PO TABS
10.0000 mg | ORAL_TABLET | Freq: Every day | ORAL | Status: DC
  Administered 2016-12-06 – 2016-12-07 (×2): 10 mg via ORAL
  Filled 2016-12-06 (×2): qty 1

## 2016-12-06 MED ORDER — FENTANYL CITRATE (PF) 100 MCG/2ML IJ SOLN
INTRAMUSCULAR | Status: AC
  Administered 2016-12-06: 50 ug via INTRAVENOUS
  Filled 2016-12-06: qty 2

## 2016-12-06 MED ORDER — NICOTINE 7 MG/24HR TD PT24
7.0000 mg | MEDICATED_PATCH | Freq: Every day | TRANSDERMAL | Status: DC
  Administered 2016-12-06 – 2016-12-07 (×2): 7 mg via TRANSDERMAL
  Filled 2016-12-06 (×2): qty 1

## 2016-12-06 MED ORDER — ASPIRIN EC 81 MG PO TBEC
81.0000 mg | DELAYED_RELEASE_TABLET | Freq: Every day | ORAL | Status: DC
  Administered 2016-12-06 – 2016-12-07 (×2): 81 mg via ORAL
  Filled 2016-12-06 (×2): qty 1

## 2016-12-06 MED ORDER — ACETAMINOPHEN 650 MG RE SUPP: 650.0000 mg | Freq: Four times a day (QID) | RECTAL | Status: DC | PRN

## 2016-12-06 MED ORDER — MAGNESIUM OXIDE 400 (241.3 MG) MG PO TABS
400.0000 mg | ORAL_TABLET | Freq: Every day | ORAL | Status: DC
  Administered 2016-12-06 – 2016-12-07 (×2): 400 mg via ORAL
  Filled 2016-12-06 (×2): qty 1

## 2016-12-06 MED ORDER — SPIRONOLACTONE 25 MG PO TABS
25.0000 mg | ORAL_TABLET | Freq: Two times a day (BID) | ORAL | Status: DC
  Administered 2016-12-06 – 2016-12-07 (×2): 25 mg via ORAL
  Filled 2016-12-06 (×2): qty 1

## 2016-12-06 MED ORDER — POTASSIUM CHLORIDE CRYS ER 10 MEQ PO TBCR
10.0000 meq | EXTENDED_RELEASE_TABLET | Freq: Two times a day (BID) | ORAL | Status: DC
  Administered 2016-12-06 – 2016-12-07 (×3): 10 meq via ORAL
  Filled 2016-12-06 (×6): qty 1

## 2016-12-06 MED ORDER — FOLIC ACID 1 MG PO TABS
1.0000 mg | ORAL_TABLET | Freq: Every day | ORAL | Status: DC
  Administered 2016-12-06 – 2016-12-07 (×2): 1 mg via ORAL
  Filled 2016-12-06 (×2): qty 1

## 2016-12-06 MED ORDER — VANCOMYCIN HCL IN DEXTROSE 750-5 MG/150ML-% IV SOLN
750.0000 mg | Freq: Two times a day (BID) | INTRAVENOUS | Status: DC
  Administered 2016-12-07: 750 mg via INTRAVENOUS
  Filled 2016-12-06 (×4): qty 150

## 2016-12-06 MED ORDER — SENNOSIDES-DOCUSATE SODIUM 8.6-50 MG PO TABS: 1.0000 | ORAL_TABLET | Freq: Every evening | ORAL | Status: DC | PRN

## 2016-12-06 MED ORDER — FUROSEMIDE 40 MG PO TABS
40.0000 mg | ORAL_TABLET | Freq: Every day | ORAL | Status: DC
  Administered 2016-12-06 – 2016-12-07 (×2): 40 mg via ORAL
  Filled 2016-12-06 (×2): qty 1

## 2016-12-06 MED ORDER — MORPHINE SULFATE (PF) 4 MG/ML IV SOLN
4.0000 mg | INTRAVENOUS | Status: DC | PRN
  Administered 2016-12-06 – 2016-12-07 (×7): 4 mg via INTRAVENOUS
  Filled 2016-12-06 (×7): qty 1

## 2016-12-06 MED ORDER — BENAZEPRIL HCL 40 MG PO TABS
40.0000 mg | ORAL_TABLET | Freq: Every day | ORAL | Status: DC
  Administered 2016-12-06 – 2016-12-07 (×2): 40 mg via ORAL
  Filled 2016-12-06: qty 1
  Filled 2016-12-06: qty 2

## 2016-12-06 MED ORDER — ONDANSETRON HCL 4 MG PO TABS: 4.0000 mg | ORAL_TABLET | Freq: Four times a day (QID) | ORAL | Status: DC | PRN

## 2016-12-06 MED ORDER — ATORVASTATIN CALCIUM 40 MG PO TABS
40.0000 mg | ORAL_TABLET | Freq: Every day | ORAL | Status: DC
  Administered 2016-12-06: 40 mg via ORAL
  Filled 2016-12-06: qty 1

## 2016-12-06 MED ORDER — LORAZEPAM 2 MG/ML IJ SOLN: 1.0000 mg | Freq: Four times a day (QID) | INTRAMUSCULAR | Status: DC | PRN

## 2016-12-06 MED ORDER — VANCOMYCIN HCL 10 G IV SOLR
1250.0000 mg | Freq: Once | INTRAVENOUS | Status: AC
  Administered 2016-12-06: 1250 mg via INTRAVENOUS
  Filled 2016-12-06: qty 1250

## 2016-12-06 NOTE — Progress Notes (Signed)
Pharmacy Antibiotic Note  Crystal Patterson is a 51 y.o. female admitted on 12/06/2016 with osteomyelitis.  Pharmacy has been consulted for vancomycin dosing.  Patient currently afebrile, wbc normal. Xray of hand cannot rule out osteo, plan is to obtain CT scan.   Goal trough 15-20  Plan: Vancomycin  IV x1 now then  IV q12 hours  Height:  (162.6 cm) Weight: 117 lb 1 oz (53.1 kg) IBW/kg (Calculated) : 54.7  Temp (24hrs), Avg:98.5 F (36.9 C), Min:98.3 F (36.8 C), Max:98.7 F (37.1 C)   Recent Labs Lab 12/06/16 1239  WBC 8.9  CREATININE 0.76  LATICACIDVEN 1.1    Estimated Creatinine Clearance: 70.5 mL/min (by C-G formula based on SCr of 0.76 mg/dL).    No Known Allergies  Thank you for allowing pharmacy to be a part of this patient's care.  Sheppard Coil PharmD., BCPS Clinical Pharmacist Pager [phone] 12/06/2016 4:09 PM

## 2016-12-06 NOTE — Progress Notes (Signed)
Soaked right second digit in soap and water per order until xeroform dressing able to be removed.  Right second digit is covered in eschar w/graying perimeter; small amount of maceration on anterior side; left lateral side of fingertip w/small open area, non-approximated.  Patient crying in pain, stating 10/10 even w/Morphine and Fentanyl on board.  Also c/o burning sensation.  Fingertip cleansed w/Betadine, applied vaseline gauze, and wrapped in kerlex dressing.  Await WOC or surgical consult for further evaluation.

## 2016-12-06 NOTE — H&P (Signed)
History and Physical    Crystal Patterson ZOX:[id number] DOB: 1965-12-15 DOA: 12/06/2016  PCP: Erle Crocker (Inactive) Patient coming from: [facility]  Chief Complaint: syncope  HPI: Crystal Patterson is a 51 y.o. female with medical history significant for SLE with a history of nephritis, Rehman's, tobacco use, status of upper extremity peripheral vascular disease with ischemic injury to hands and multiple amputated digits most recent 10/4, hypertension, MI, CHF, history of osteomyelitis of hand GERD, peripheral sympathectomy I'll hands 2014 secondary to pain since to room 9 2C at  [place] from [facility] complaint of syncope.  Information is obtained from the patient and the daughter who is at the bedside as well as chart review. Last evening while sitting at the dinner table patient "flopped over". Family reports she had "white stuff" coming out of her mouth. Some question is whether not she was bleeding so 911 was called. They're instructed family to put patient on the ground at which point she awakened. Patient has no memory of the episode. She had finger amputated 4 days ago admits to taking pain medicine. She also admits to smoking marijuana and drinking 4 beers a day. She denies headache dizziness chest pain palpitation shortness of breath. She denies any worsening cough abdominal pain nausea vomiting lower extremity edema. She denies dysuria hematuria frequency or urgency. No report of any seizure like activity. States EMS was called but patient was transported to the emergency department with private vehicle.  Workup at [facility] included EKG that was questionably abnormal. Case discussed with cardiology who opined patient should be admitted by hospitalist for rule out MI and syncope workup   ED Course: In the emergency department she's afebrile hemodynamically stable hypoxic.  Review of Systems: As per HPI otherwise all other systems reviewed and are negative.    Ambulatory Status: Moderate independently. Is independent with ADLs  Past Medical History:  Diagnosis Date  . Arthritis   . Elevated troponin 05/24/2016  . Finger amputation, no complication   . Hypertension   . Lupus     Past Surgical History:  Procedure Laterality Date  . RIGHT/LEFT HEART CATH AND CORONARY ANGIOGRAPHY N/A 05/25/2016   Procedure: Right/Left Heart Cath and Coronary Angiography;  Surgeon: Lennette Bihari, MD;  Location: [facility] INVASIVE CV LAB;  Service: Cardiovascular;  Laterality: N/A;    Social History   Social History  . Marital status: Single    Spouse name: N/A  . Number of children: N/A  . Years of education: N/A   Occupational History  . Not on file.   Social History Main Topics  . Smoking status: Current Every Day Smoker    Packs/day: 0.10  . Smokeless tobacco: Never Used  . Alcohol use No  . Drug use: No  . Sexual activity: Not Currently   Other Topics Concern  . Not on file   Social History Narrative  . No narrative on file  Lives at home alone. Continues to smoke and drink.  No Known Allergies  Family History  Problem Relation Age of Onset  . Pancreatic cancer Mother   . Colon cancer Father     Prior to Admission medications   Medication Sig Start Date End Date Taking? Authorizing Provider  allopurinol (ZYLOPRIM) 100 MG tablet Take 1 tablet (100 mg total) by mouth daily. 05/27/16   Barrett, Joline Salt, PA-C  amLODipine (NORVASC) 10 MG tablet Take 1 tablet (10 mg total) by mouth daily. 05/27/16  Barrett, Shawn Stall  aspirin EC 81 MG EC tablet Take 1 tablet (81 mg total) by mouth daily. 05/27/16   Barrett, Joline Salt, PA-C  atenolol (TENORMIN) 25 MG tablet Take 1 tablet (25 mg total) by mouth daily. 05/27/16   Barrett, Joline Salt, PA-C  atorvastatin (LIPITOR) 40 MG tablet Take 1 tablet (40 mg total) by mouth daily at 6 PM. 05/26/16   Barrett, Joline Salt, PA-C  benazepril (LOTENSIN) 40 MG tablet Take 1 tablet (40 mg total) by mouth daily. 05/27/16    Barrett, Joline Salt, PA-C  furosemide (LASIX) 40 MG tablet Take 1 tablet (40 mg total) by mouth daily. 05/27/16   Barrett, Joline Salt, PA-C  magnesium oxide (MAG-OX) 400 MG tablet Take 400 mg by mouth daily. TAKE DAILY FOR 4 DAYS STARTING 06/10/16    [provider]  nicotine (NICODERM CQ - DOSED IN MG/24 HOURS) 14 mg/24hr patch PLACE 1 PATCH DAILY FOR 6 WEEKS 06/02/16   Antoine Poche, MD  nicotine (NICODERM CQ - DOSED IN MG/24 HR) 7 mg/24hr patch PLACE ONE PATCH DAILY FOR 2 WEEKS 06/02/16   Antoine Poche, MD  potassium chloride (K-DUR) 10 MEQ tablet Take 10 mEq by mouth 2 (two) times daily.    [provider]  spironolactone (ALDACTONE) 25 MG tablet Take 1 tablet (25 mg total) by mouth 2 (two) times daily. 05/26/16   Barrett, Joline Salt, PA-C    Physical Exam: Vitals:   12/06/16 1115  BP: (!) 129/97  Resp: (!) 26  Temp: 98.3 F (36.8 C)  TempSrc: Oral  SpO2: 91%  Weight: 53.1 kg (117 lb 1 oz)     General:  Appears calm and comfortable Lying in bed when I entered the room. This is a started talking to her she began to scream and cry in pain. Eyes:  PERRL, EOMI, normal lids, iris ENT:  grossly normal hearing, lips & tongue, mucous membranes of her mouth are moist and pink Neck:  no LAD, masses or thyromegaly Cardiovascular:  RRR, no m/r/g. No LE edema. Pedal pulses present and palpable Respiratory:  CTA bilaterally, no w/r/r. Normal respiratory effort. Abdomen:  soft, ntnd, positive bowel sounds no guarding or rebounding Skin:  no rash or induration seen on limited exam Musculoskeletal:  Dressing to index finger right hand. Dry and intact. Otherwise joints without swelling/erythema all extremities spontaneously Psychiatric:  grossly normal mood and affect, speech fluent and appropriate, AOx3 Neurologic:  CN 2-12 grossly intact, moves all extremities in coordinated fashion, sensation intact. Anxious alert and oriented to some place. Able to follow commands. Able to make  once a needs known  Labs on Admission: I have personally reviewed following labs and imaging studies  CBC: No results for input(s): WBC, NEUTROABS, HGB, HCT, MCV, PLT in the last 168 hours. Basic Metabolic Panel: No results for input(s): NA, K, CL, CO2, GLUCOSE, BUN, CREATININE, CALCIUM, MG, PHOS in the last 168 hours. GFR: CrCl cannot be calculated (Patient's most recent lab result is older than the maximum 21 days allowed.). Liver Function Tests: No results for input(s): AST, ALT, ALKPHOS, BILITOT, PROT, ALBUMIN in the last 168 hours. No results for input(s): LIPASE, AMYLASE in the last 168 hours. No results for input(s): AMMONIA in the last 168 hours. Coagulation Profile: No results for input(s): INR, PROTIME in the last 168 hours. Cardiac Enzymes: No results for input(s): CKTOTAL, CKMB, CKMBINDEX, TROPONINI in the last 168 hours. BNP (last 3 results) No results for input(s): PROBNP in the  last 8760 hours. HbA1C: No results for input(s): HGBA1C in the last 72 hours. CBG: No results for input(s): GLUCAP in the last 168 hours. Lipid Profile: No results for input(s): CHOL, HDL, LDLCALC, TRIG, CHOLHDL, LDLDIRECT in the last 72 hours. Thyroid Function Tests: No results for input(s): TSH, T4TOTAL, FREET4, T3FREE, THYROIDAB in the last 72 hours. Anemia Panel: No results for input(s): VITAMINB12, FOLATE, FERRITIN, TIBC, IRON, RETICCTPCT in the last 72 hours. Urine analysis:    Component Value Date/Time   COLORURINE straw 03/28/2007 1023   APPEARANCEUR Cloudy 03/28/2007 1023   LABSPEC 1.010 03/28/2007 1023   PHURINE 5.0 03/28/2007 1023   GLUCOSEU negative 10/08/2005   HGBUR large 03/28/2007 1023   BILIRUBINUR negative 03/28/2007 1023   KETONESUR negative 10/08/2005   PROTEINUR 30 10/08/2005   UROBILINOGEN 0.2 03/28/2007 1023   NITRITE negative 03/28/2007 1023   LEUKOCYTESUR small 10/08/2005    Creatinine Clearance: CrCl cannot be calculated (Patient's most recent lab result  is older than the maximum 21 days allowed.).  Sepsis Labs: (procalcitonin:4,lacticidven:4) )No results found for this or any previous visit (from the past 240 hour(s)).   Radiological Exams on Admission: No results found.  EKG: Independently reviewed. Normal sinus rhythm Minimal voltage criteria for LVH, may be normal variant Nonspecific T wave abnormality Prolonged QT  Assessment/Plan Principal Problem:   Syncope Active Problems:   Essential hypertension   Asthma   GERD   IBS   Systemic lupus erythematosus (HCC)   LVH (left ventricular hypertrophy)   ETOH abuse   PAD (peripheral artery disease) (HCC)   #1. Syncope. Etiology unclear. Patient does have a history of CHF and STEMI this year. Cath in 2018 no significant CAD. No signs of infection. No metabolic derangement from lab work done last night. Some question about abnormal EKG initially. Cardiology recommended hospitalist admission for syncope workup and rule out MI. No chest pain -Admit to telemetry -Cycle troponins -Serial EKG -Obtain a 2-D echo -Chest x-ray -Urinalysis -Obtain orthostatic vital signs -Complete metabolic panel, complete blood count, TSH -Evaluate medications  #2. Left ventricular hypertrophy. Echo in March 2018 with an EF of 75% no wall motion abnormalities, grade 1 diastolic dysfunction. Elevated L a pressure Severe LAE. Appears compensated. Home medications include atenolol, Lasix, aldactone -Daily weights -Monitor intake and output -Continue home meds -2-D echo -Chest x-ray  #3. Hypertension, fair control. -Continue home meds -Monitor  #4. Peripheral artery disease. Status post recent amputation finger on right hand. Screaming and hollering and pain -We'll get a plain x-ray -Pain management -Wound consult  #5. Systemic lupus. Stable. Does not appear to be on any medications.  #6. GERD. Stable at baseline  #7. Gout. -Into home meds  #8. Tobacco use. -Cessation counseling  offered  #9. ETOH. Reports drinking 3-4 beers daily. -CIWA protocol   DVT prophylaxis: scd  Code Status: full  Family Communication: daughter at bedside  Disposition Plan: home  Consults called: none  Admission status: obs    Toya Smothers M MD [facility]  If 7PM-7AM, please contact night-coverage [email or web address] Password [email or web address]  12/06/2016, 12:54 PM

## 2016-12-06 NOTE — Progress Notes (Signed)
  Echocardiogram 2D Echocardiogram has been performed.  Sheralyn Boatman R 12/06/2016, 2:30 PM

## 2016-12-06 NOTE — Progress Notes (Signed)
Patient arrived from [place].  Patient crying out in pain from right second digit; no orders.  [facility] contacted re: admission and pain med requests.  Attempted to assess amputation site; however, xeroform dressing very very dry and stuck to site.  Unable to remove without possibly disrupting surgical field so this RN will wait for WOC or surgical consult/orders to change dressing.

## 2016-12-06 NOTE — Progress Notes (Signed)
Plain xray of right hand with Irregularity presumably postoperative. Difficult to definitively exclude Osteomyelitis per reports.   Will obtain CT rule out osteomyelitis.  Will request pharmacy consult for vancomycin dosing.     Clydie Braun m. Black, NP

## 2016-12-07 ENCOUNTER — Observation Stay (HOSPITAL_BASED_OUTPATIENT_CLINIC_OR_DEPARTMENT_OTHER): Payer: Medicare Other

## 2016-12-07 ENCOUNTER — Observation Stay (HOSPITAL_COMMUNITY): Payer: Medicare Other

## 2016-12-07 DIAGNOSIS — R55 Syncope and collapse: Principal | ICD-10-CM

## 2016-12-07 DIAGNOSIS — M329 Systemic lupus erythematosus, unspecified: Secondary | ICD-10-CM

## 2016-12-07 DIAGNOSIS — I517 Cardiomegaly: Secondary | ICD-10-CM

## 2016-12-07 DIAGNOSIS — I252 Old myocardial infarction: Secondary | ICD-10-CM | POA: Diagnosis not present

## 2016-12-07 DIAGNOSIS — R0902 Hypoxemia: Secondary | ICD-10-CM | POA: Diagnosis not present

## 2016-12-07 DIAGNOSIS — I110 Hypertensive heart disease with heart failure: Secondary | ICD-10-CM | POA: Diagnosis not present

## 2016-12-07 DIAGNOSIS — I10 Essential (primary) hypertension: Secondary | ICD-10-CM | POA: Diagnosis not present

## 2016-12-07 DIAGNOSIS — Z7982 Long term (current) use of aspirin: Secondary | ICD-10-CM | POA: Diagnosis not present

## 2016-12-07 DIAGNOSIS — Z79899 Other long term (current) drug therapy: Secondary | ICD-10-CM | POA: Diagnosis not present

## 2016-12-07 DIAGNOSIS — I5032 Chronic diastolic (congestive) heart failure: Secondary | ICD-10-CM | POA: Diagnosis not present

## 2016-12-07 DIAGNOSIS — I739 Peripheral vascular disease, unspecified: Secondary | ICD-10-CM | POA: Diagnosis not present

## 2016-12-07 DIAGNOSIS — M109 Gout, unspecified: Secondary | ICD-10-CM | POA: Diagnosis not present

## 2016-12-07 DIAGNOSIS — K219 Gastro-esophageal reflux disease without esophagitis: Secondary | ICD-10-CM | POA: Diagnosis not present

## 2016-12-07 DIAGNOSIS — Z89021 Acquired absence of right finger(s): Secondary | ICD-10-CM | POA: Diagnosis not present

## 2016-12-07 LAB — URINALYSIS, ROUTINE W REFLEX MICROSCOPIC
Bilirubin Urine: NEGATIVE
Glucose, UA: NEGATIVE mg/dL
Hgb urine dipstick: NEGATIVE
Ketones, ur: NEGATIVE mg/dL
Nitrite: NEGATIVE
Protein, ur: NEGATIVE mg/dL
Specific Gravity, Urine: 1.009 (ref 1.005–1.030)
pH: 6 (ref 5.0–8.0)

## 2016-12-07 LAB — BASIC METABOLIC PANEL
Anion gap: 9 (ref 5–15)
BUN: 7 mg/dL (ref 6–20)
CO2: 30 mmol/L (ref 22–32)
Calcium: 9 mg/dL (ref 8.9–10.3)
Chloride: 99 mmol/L — ABNORMAL LOW (ref 101–111)
Creatinine, Ser: 0.71 mg/dL (ref 0.44–1.00)
GFR calc Af Amer: >60 mL/min (ref >60)
GFR calc non Af Amer: >60 mL/min (ref >60)
Glucose, Bld: 86 mg/dL (ref 65–99)
Potassium: 3.6 mmol/L (ref 3.5–5.1)
Sodium: 138 mmol/L (ref 135–145)

## 2016-12-07 LAB — TROPONIN I: Troponin I: 0.03 ng/mL (ref <0.03)

## 2016-12-07 MED ORDER — DOXYCYCLINE HYCLATE 100 MG PO CAPS: 100.0000 mg | ORAL_CAPSULE | Freq: Two times a day (BID) | ORAL | 0 refills | Status: DC

## 2016-12-07 MED ORDER — OXYCODONE-ACETAMINOPHEN 5-325 MG PO TABS: 1.0000 | ORAL_TABLET | Freq: Four times a day (QID) | ORAL | 0 refills | Status: DC | PRN

## 2016-12-07 NOTE — Progress Notes (Signed)
Initial Nutrition Assessment  DOCUMENTATION CODES:   Not applicable  INTERVENTION:   -Continue Ensure Enlive po BID, each supplement provides 350 kcal and 20 grams of protein   NUTRITION DIAGNOSIS:   Inadequate oral intake related to poor appetite as evidenced by per patient/family report, meal completion < 25%.  GOAL:   Patient will meet greater than or equal to 90% of their needs  MONITOR:   PO intake, Supplement acceptance, Labs, Weight trends  REASON FOR ASSESSMENT:   Malnutrition Screening Tool    ASSESSMENT:   51 yo female admitted with syncope, severe pain at recent finger amputation site. Pt with upper extremety PVD with ischemic injury to hands with multiple amputated digits, mostly recent amputation 12/02/16. Additional hx of CAD/MI, HTN, MI, CHF, osteomyelitis, lupus. Pt drinks 4 beers per day, on CIWA  Pt sleeping soundly on visit today, daughter at bedside. Daughter reports pt with 15 pound wt loss in 1 month (11% wt loss which is significant for time frame) and reports poor appetite for 2-3 months.   Recorded po intake 0% at breakfast this AM; lunch tray in room on visit today and was untouched (again pt sleeping soundly). Pt did drink AM Ensure Enlive this AM. Daughter reports pt likes Ensure/Boost, drinks at home.   Unable to complete Nutrition-Focused physical exam at this time.   Labs: reviewed Meds: MVI, thiamine, folic acid, KCl  Diet Order:  Diet Heart Room service appropriate? Yes; Fluid consistency: Thin  Skin:  Wound (see comment) (surgical wound post recent finger amputation)  Last BM:  10/7  Height:   Ht Readings from Last 1 Encounters:  12/06/16  (1.626 m)    Weight:   Wt Readings from Last 1 Encounters:  12/07/16 121 lb 4.8 oz (55 kg)    Ideal Body Weight:  54.5 kg  BMI:  Body mass index is 20.82 kg/m.  Estimated Nutritional Needs:   Kcal:  1650-1815 kcals  Protein:  83-90 g  Fluid:  >/= 1.7 L  EDUCATION NEEDS:    No education needs identified at this time  Romelle Starcher MS, RD, LDN [phone] Pager  [phone] Weekend/On-Call Pager

## 2016-12-07 NOTE — Plan of Care (Signed)
Problem: Safety: Goal: Ability to remain free from injury will improve Outcome: Progressing Patient education provided regarding slow transition-  from lying to sitting and sitting to standing  Problem: Pain Managment: Goal: General experience of comfort will improve Monitored pain level using 0-10 pain scale.  Provided pain medication per orders.   Problem: Bowel/Gastric: Goal: Will not experience complications related to bowel motility Outcome: Progressing Provided education regarding monitoring BM and complications while on pain medications.

## 2016-12-07 NOTE — Progress Notes (Signed)
Visited patient via consult for Advanced Directive.  Patient states she did not request Advanced Directive but once I explained what it is, she said her daughter could be the person to make those decisions.  Patient said she would like me to leave paperwork.  I am not sure if patient fully understands but I did ask her to have her daughter look it over with her and if she would like it completed while she is here that we could assist her with a notary and help her understand what she is completing. Chaplain left paperwork with her. Patient stated for me to leave it and said that she was cold. Helped her put blanket around her.  Chaplain thanks the medical team for caring for her.

## 2016-12-07 NOTE — Progress Notes (Signed)
Discharged patient- gave discharge instruction and reviewed medication w/ pt.  Removed IV. Pt and daughter verbalized understanding of meds and follow up appt.  D/c home via wheel chair.

## 2016-12-07 NOTE — Progress Notes (Signed)
VASCULAR LAB PRELIMINARY  PRELIMINARY  PRELIMINARY  PRELIMINARY  Carotid duplex completed.    Preliminary report:  Technically difficult due to extremely high bifurcation. Bilateral:  1-39% ICA stenosis.  Vertebral artery flow is antegrade on the right. Unable to insonate the left due to respiratory interference   Jordyn Hofacker, RVS 12/07/2016, 2:38 PM

## 2016-12-07 NOTE — Procedures (Signed)
ELECTROENCEPHALOGRAM REPORT  Date of Study: 12/07/2016  Patient's Name: Crystal Patterson MRN: [id number] Date of Birth: [birth year]  Referring Provider: Dr. Pamella Pert  Clinical History: This is a 51 year old woman with syncope  Medications: acetaminophen (TYLENOL) tablet 650 mg  allopurinol (ZYLOPRIM) tablet 100 mg  amLODipine (NORVASC) tablet 10 mg  aspirin EC tablet 81 mg  atenolol (TENORMIN) tablet 25 mg  atorvastatin (LIPITOR) tablet 40 mg  benazepril (LOTENSIN) tablet 40 mg  folic acid (FOLVITE) tablet 1 mg  furosemide (LASIX) tablet 40 mg  magnesium oxide (MAG-OX) tablet 400 mg  morphine 4 MG/ML injection 4 mg  multivitamin with minerals tablet 1 tablet  nicotine (NICODERM CQ - dosed in mg/24 hr) patch 7 mg  potassium chloride (K-DUR,KLOR-CON) CR tablet 10 mEq  senna-docusate (Senokot-S) tablet 1 tablet  spironolactone (ALDACTONE) tablet 25 mg  thiamine (VITAMIN B-1) tablet 100 mg  vancomycin (VANCOCIN) IVPB 750 mg/150 ml premix   Technical Summary: A multichannel digital EEG recording measured by the international 10-20 system with electrodes applied with paste and impedances below 5000 ohms performed in our laboratory with EKG monitoring in a predominantly drowsy and asleep patient.  Hyperventilation and photic stimulation were not performed.  The digital EEG was referentially recorded, reformatted, and digitally filtered in a variety of bipolar and referential montages for optimal display.    Description: The patient is predominantly drowsy and asleep during the recording.  During brief period of wakefulness, there is a symmetric, medium voltage 9.5 Hz posterior dominant rhythm that attenuates with eye opening.  The record is symmetric.  During drowsiness and sleep, there is an increase in theta and delta slowing of the background, with shifting asymmetry over the bilateral temporal regions. Occasional vertex waves were seen.  Hyperventilation and photic stimulation were  not performed.  There were no epileptiform discharges or electrographic seizures seen.    EKG lead was unremarkable.  Impression: This predominantly drowsy and asleep EEG is normal.    Clinical Correlation: A normal EEG does not exclude a clinical diagnosis of epilepsy. Clinical correlation is advised.   Patrcia Dolly, M.D.

## 2016-12-07 NOTE — Progress Notes (Signed)
EEG Completed; Results Pending  

## 2016-12-07 NOTE — Discharge Summary (Signed)
Physician Discharge Summary  [name] ZDG:[id number] DOB: 1965/09/18 DOA: 12/06/2016  PCP: Nat Christen (Inactive)  Admit date: 12/06/2016 Discharge date: 12/07/2016  Admitted From: home Disposition:  home  Recommendations for Outpatient Follow-up:  1. Follow up with PCP in 1-2 weeks  Home Health: none Equipment/Devices: none  Discharge Condition: stable CODE STATUS: Full code Diet recommendation: heart healthy  HPI: Per Crystal Carrel, NP, Crystal Patterson is a 51 y.o. female with medical history significant for SLE with a history of nephritis, Rehman's, tobacco use, status of upper extremity peripheral vascular disease with ischemic injury to hands and multiple amputated digits most recent 10/4, hypertension, MI, CHF, history of osteomyelitis of hand GERD, peripheral sympathectomy I'll hands 2014 secondary to pain since to room 9 2C at  [place] from [place] complaint of syncope. Information is obtained from the patient and the daughter who is at the bedside as well as chart review. Last evening while sitting at the dinner table patient "flopped over". Family reports she had "white stuff" coming out of her mouth. Some question is whether not she was bleeding so 911 was called. They're instructed family to put patient on the ground at which point she awakened. Patient has no memory of the episode. She had finger amputated 4 days ago admits to taking pain medicine. She also admits to smoking marijuana and drinking 4 beers a day. She denies headache dizziness chest pain palpitation shortness of breath. She denies any worsening cough abdominal pain nausea vomiting lower extremity edema. She denies dysuria hematuria frequency or urgency. No report of any seizure like activity. States EMS was called but patient was transported to the emergency department with private vehicle. Workup at [facility] included EKG that was questionably abnormal. Case discussed with  cardiology who opined patient should be admitted by hospitalist for rule out MI and syncope workup  Hospital Course: Discharge Diagnoses:  Principal Problem:   Syncope Active Problems:   Essential hypertension   Asthma   GERD   IBS   Systemic lupus erythematosus (HCC)   LVH (left ventricular hypertrophy)   ETOH abuse   PAD (peripheral artery disease) ([place])   Syncope -patient was admitted with a syncopal episode at home.  She has a history of coronary artery disease, however cardiac catheterization 2018 did not show any significant coronary artery disease.  She was monitored on telemetry without significant events.  Her cardiac enzymes were cycled and were essentially flat at 0.03, not in a pattern consistent with ACS.  She denies any chest pain or palpitations.  She underwent a 2D echo which showed normal ejection fraction and severe LVH, which was known prior.  Carotid ultrasound did not show any significant obstruction.  EEG without seizure activity.  She does mention that given her recent finger amputation, she has been taking hydrocodone at home, and in addition she has been drinking alcohol as well as smoking marijuana.  This may be playing a role into her syncopal episode, she was strongly counseled against combining alcohol with pain medications.  Daughter is at bedside, and reaffirms that she will closely monitor patient not to have any more alcohol intake while she is taking pain medications. Hypertension -Continue home meds Peripheral artery disease status post recent amputation finger on the right hand -her home pain medications, working, will discontinue, provide short course of oxycodone, strongly advised patient to get in touch with her vascular surgeon ASAP for better pain control if she still  in pain when she gets home.  CT scan of the hand/finger without evidence of infection, however there is a concern for superficial infection, patient was placed empirically on vancomycin, will  do a short course of doxycycline on discharge. Systemic lupus -Stable. Does not appear to be on any medications. GERD -Stable at baseline Gout - resume home meds Tobacco use -counseled for cessation ETOH -Reports drinking 3-4 beers daily, counseled against alcohol use.  She did not trigger CIWA   Discharge Instructions   Allergies as of 12/07/2016   No Known Allergies     Medication List    TAKE these medications   allopurinol 100 MG tablet Commonly known as:  ZYLOPRIM Take 1 tablet (100 mg total) by mouth daily.   amLODipine 10 MG tablet Commonly known as:  NORVASC Take 1 tablet (10 mg total) by mouth daily.   aspirin 81 MG EC tablet Take 1 tablet (81 mg total) by mouth daily.   atenolol 25 MG tablet Commonly known as:  TENORMIN Take 1 tablet (25 mg total) by mouth daily.   atorvastatin 40 MG tablet Commonly known as:  LIPITOR Take 1 tablet (40 mg total) by mouth daily at 6 PM.   benazepril 40 MG tablet Commonly known as:  LOTENSIN Take 1 tablet (40 mg total) by mouth daily.   doxycycline 100 MG capsule Commonly known as:  VIBRAMYCIN Take 1 capsule (100 mg total) by mouth 2 (two) times daily.   furosemide 40 MG tablet Commonly known as:  LASIX Take 1 tablet (40 mg total) by mouth daily.   magnesium oxide 400 MG tablet Commonly known as:  MAG-OX Take 400 mg by mouth daily. TAKE DAILY FOR 4 DAYS STARTING 06/10/16   nicotine 7 mg/24hr patch Commonly known as:  NICODERM CQ - dosed in mg/24 hr PLACE ONE PATCH DAILY FOR 2 WEEKS What changed:  Another medication with the same name was removed. Continue taking this medication, and follow the directions you see here.   oxyCODONE-acetaminophen 5-325 MG tablet Commonly known as:  ROXICET Take 1 tablet by mouth every 6 (six) hours as needed.   potassium chloride 10 MEQ tablet Commonly known as:  K-DUR Take 10 mEq by mouth 2 (two) times daily.   spironolactone 25 MG tablet Commonly known as:  ALDACTONE Take 1  tablet (25 mg total) by mouth 2 (two) times daily.      Follow-up Information    Nat Christen. Schedule an appointment as soon as possible for a visit in 1 week.   Specialty:  Internal Medicine Contact information: [address] [place] [address] [phone]           Consultations:  None   Procedures/Studies:  2D echo  Impressions: - Vigorous LV systolic function; severe LVH; mild diastolic dysfunction with elevated LV filling pressure; intracavitary gradient of approximately 2.3 m/s; mild LAE; severe MAC with mild MS.  EEG Impression: This predominantly drowsy and asleep EEG is normal.    Carotid doppler Preliminary report:  Technically difficult due to extremely high bifurcation. Bilateral:  1-39% ICA stenosis.  Vertebral artery flow is antegrade on the right. Unable to insonate the left due to respiratory interference  Ct Hand Right Wo Contrast  Result Date: 12/07/2016 CLINICAL DATA:  Finger pain appears to be her prime concern. There doesn't seem to be a lot of regular wound care undertaken by pt at home after amputation. Finger will need to be soaked to have bandages removed and regular  wound EXAM: CT OF THE RIGHT HAND WITHOUT CONTRAST TECHNIQUE: Multidetector CT imaging of the right hand was performed according to the standard protocol. Multiplanar CT image reconstructions were also generated. COMPARISON:  None. FINDINGS: Bones/Joint/Cartilage Prior amputation of the mid second phalanx, mid third phalanx, and distal fourth phalanx. No fracture or dislocation. Normal alignment. No joint effusion. Ligaments Ligaments are suboptimally evaluated by CT. Muscles and Tendons Muscles are normal.  Intact flexor and extensor compartment tendons. Soft tissue No fluid collection or hematoma.  No soft tissue mass. IMPRESSION: 1.  No acute osseous injury of the right hand. Electronically Signed   By: Kathreen Devoid   On: 12/07/2016 08:56   Dg Hand 2 View  Right  Result Date: 12/06/2016 CLINICAL DATA:  Index finger amputation on 12/02/2016, excruciating pain, no injury. EXAM: RIGHT HAND - 2 VIEW COMPARISON:  11/09/2016. FINDINGS: Postoperative changes from recent amputation of the second digit, at the mid shaft of the middle phalanx. Prior amputations of the third digit at the base of the middle phalangeal shaft, and fourth digit at the distal interphalangeal joint. Dorsal soft tissue swelling. Osseous structures are otherwise unremarkable. IMPRESSION: Postoperative changes of recent amputation of the second digit, at the midshaft of the second middle phalanx. Irregularity is presumably postoperative. Difficult to definitively exclude osteomyelitis. Electronically Signed   By: Lorin Picket M.D.   On: 12/06/2016 13:25   Dg Chest Port 1 View  Result Date: 12/06/2016 CLINICAL DATA:  Syncope today. EXAM: PORTABLE CHEST 1 VIEW COMPARISON:  Single-view of the chest 12/05/2016. PA and lateral chest 05/24/2016. FINDINGS: Left subclavian approach Port-A-Cath is unchanged. Lungs are clear. Heart size is normal. No pneumothorax or pleural effusion. Aortic atherosclerosis is noted. Calcified mitral annulus is seen. IMPRESSION: No acute disease. Electronically Signed   By: Inge Rise M.D.   On: 12/06/2016 13:24   Dg Finger Index Right  Result Date: 11/09/2016 CLINICAL DATA:  Pain in the right index finger for 2-3 weeks. [place] out of the wheelchair. EXAM: RIGHT INDEX FINGER 2+V COMPARISON:  10/24/2016 FINDINGS: There is no acute fracture or subluxation. There is irregularity of the tuft, stable in appearance. Thinning of the soft tissues of the distal aspect of the digit, combined with soft tissue swelling of the proximal digit. 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 IMPRESSION: 1. Stable appearance of the index finger. 2. No acute fracture. Electronically Signed   By: Nolon Nations M.D.   On: 11/09/2016 16:58      Subjective: -Complains of finger  pain, no chest pain, no shortness of breath.  No abdominal pain, no nausea or vomiting  Discharge Exam: Vitals:   12/07/16 0748 12/07/16 0954  BP:  117/75  Pulse:  61  Resp: 16   Temp:    SpO2:      General: Pt is alert, awake, not in acute distress Cardiovascular: RRR, S1/S2 +, no rubs, no gallops Respiratory: CTA bilaterally, no wheezing, no rhonchi   The results of significant diagnostics from this hospitalization (including imaging, microbiology, ancillary and laboratory) are listed below for reference.     Microbiology: Recent Results (from the past 240 hour(s))  MRSA PCR Screening     Status: None   Collection Time: 12/06/16 11:27 AM  Result Value Ref Range Status   MRSA by PCR NEGATIVE NEGATIVE Final    Comment:        The GeneXpert MRSA Assay (FDA approved for NASAL specimens only), is one component of a comprehensive MRSA colonization surveillance  program. It is not intended to diagnose MRSA infection nor to guide or monitor treatment for MRSA infections.   Culture, blood (routine x 2)     Status: None (Preliminary result)   Collection Time: 12/06/16  5:05 PM  Result Value Ref Range Status   Specimen Description BLOOD LEFT ANTECUBITAL  Final   Special Requests IN PEDIATRIC BOTTLE Blood Culture adequate volume  Final   Culture NO GROWTH < 24 HOURS  Final   Report Status PENDING  Incomplete  Culture, blood (routine x 2)     Status: None (Preliminary result)   Collection Time: 12/06/16  5:15 PM  Result Value Ref Range Status   Specimen Description BLOOD LEFT ANTECUBITAL  Final   Special Requests IN PEDIATRIC BOTTLE Blood Culture adequate volume  Final   Culture NO GROWTH < 24 HOURS  Final   Report Status PENDING  Incomplete     Labs: BNP (last 3 results)  Recent Labs  05/25/16 0010  BNP 121.9*   Basic Metabolic Panel:  Recent Labs Lab 12/06/16 1239 12/07/16 0012  NA 137 138  K 3.6 3.6  CL 100* 99*  CO2 27 30  GLUCOSE 78 86  BUN 6 7   CREATININE 0.76 0.71  CALCIUM 9.3 9.0  MG 1.5*  --    Liver Function Tests:  Recent Labs Lab 12/06/16 1239  AST 22  ALT 11*  ALKPHOS 86  BILITOT 1.5*  PROT 7.1  ALBUMIN 3.4*   No results for input(s): LIPASE, AMYLASE in the last 168 hours. No results for input(s): AMMONIA in the last 168 hours. CBC:  Recent Labs Lab 12/06/16 1239  WBC 8.9  HGB 12.8  HCT 39.3  MCV 96.8  PLT 177   Cardiac Enzymes:  Recent Labs Lab 12/06/16 1239 12/06/16 1707 12/07/16 0012  TROPONINI <0.03 <0.03 0.03*   BNP: Invalid input(s): POCBNP CBG: No results for input(s): GLUCAP in the last 168 hours. D-Dimer No results for input(s): DDIMER in the last 72 hours. Hgb A1c No results for input(s): HGBA1C in the last 72 hours. Lipid Profile No results for input(s): CHOL, HDL, LDLCALC, TRIG, CHOLHDL, LDLDIRECT in the last 72 hours. Thyroid function studies  Recent Labs  12/06/16 1707  TSH 1.839   Anemia work up No results for input(s): VITAMINB12, FOLATE, FERRITIN, TIBC, IRON, RETICCTPCT in the last 72 hours. Urinalysis    Component Value Date/Time   COLORURINE YELLOW 12/07/2016 0722   APPEARANCEUR CLEAR 12/07/2016 0722   LABSPEC 1.009 12/07/2016 0722   PHURINE 6.0 12/07/2016 0722   GLUCOSEU NEGATIVE 12/07/2016 0722   GLUCOSEU negative 10/08/2005   HGBUR NEGATIVE 12/07/2016 0722   HGBUR large 03/28/2007 1023   BILIRUBINUR NEGATIVE 12/07/2016 0722   KETONESUR NEGATIVE 12/07/2016 0722   PROTEINUR NEGATIVE 12/07/2016 0722   UROBILINOGEN 0.2 03/28/2007 1023   NITRITE NEGATIVE 12/07/2016 0722   LEUKOCYTESUR MODERATE (A) 12/07/2016 0722   Sepsis Labs Invalid input(s): PROCALCITONIN,  WBC,  LACTICIDVEN   Time coordinating discharge: 30 minutes  SIGNED:  Marzetta Board, MD  [facility] 12/07/2016, 4:29 PM Pager [phone]  If 7PM-7AM, please contact night-coverage [email or web address] Password [email or web address]

## 2016-12-07 NOTE — Care Management Note (Signed)
Case Management Note  Patient Details  Name: Crystal Patterson MRN: [id number] Date of Birth: 10-11-1965  Subjective/Objective:      Syncope             Action/Plan: Patient lives at home alone; has private insurance with [facility] with prescription drug coverage; Independent of her ADL's; CM following for DC needs  Expected Discharge Date:  12/08/16               Expected Discharge Plan:  Home/Self Care  Discharge planning Services  CM Consult Status of Service:  In process, will continue to follow  Reola Mosher [phone] 12/07/2016, 10:56 AM

## 2016-12-07 NOTE — Consult Note (Signed)
   [facility] CM Inpatient Consult   12/07/2016  Crystal Patterson [birth year] [id number]   Patient is currently off the unit.  Will follow up as appropriate. For questions, please contact:  Charlesetta Shanks, RN BSN CCM [facility]  [phone] business mobile phone Toll free office [phone]

## 2016-12-07 NOTE — Consult Note (Signed)
WOC Nurse wound consult note Reason for Consult:  Recent finger amputation at [facility] Patient reports last Thursday  Wound type: surgical  Pressure Injury POA: NA Measurement: 2cm with sutures Wound bed: closed wound bed but with slight separation and leakage of dark blood Drainage (amount, consistency, odor) dark bloody drainage, minimal, no odor Periwound:macreated with fluctuance most like small collection of blood under incision  Dressing procedure/placement/frequency: Cover with single layer of xeroform gauze, top with dry gauze, wrap with conform.  Change daily  Follow up with surgeon at [facility].   Discussed POC with patient and bedside nurse.  Re consult if needed, will not follow at this time. Thanks  Valetta Mulroy [employer], RN,CWOCN, CNS, CWON-AP [phone])

## 2016-12-07 NOTE — Discharge Instructions (Signed)
Follow with Erle Crocker (Inactive) in 5-7 days  Please get a complete blood count and chemistry panel checked by your Primary MD at your next visit, and again as instructed by your Primary MD. Please get your medications reviewed and adjusted by your Primary MD.  Please request your Primary MD to go over all Hospital Tests and Procedure/Radiological results at the follow up, please get all Hospital records sent to your Prim MD by signing hospital release before you go home.  If you had Pneumonia of Lung problems at the Hospital: Please get a 2 view Chest X ray done in 6-8 weeks after hospital discharge or sooner if instructed by your Primary MD.  If you have Congestive Heart Failure: Please call your Cardiologist or Primary MD anytime you have any of the following symptoms:  1) 3 pound weight gain in 24 hours or 5 pounds in 1 week  2) shortness of breath, with or without a dry hacking cough  3) swelling in the hands, feet or stomach  4) if you have to sleep on extra pillows at night in order to breathe  Follow cardiac low salt diet and 1.5 lit/day fluid restriction.  If you have diabetes Accuchecks 4 times/day, Once in AM empty stomach and then before each meal. Log in all results and show them to your primary doctor at your next visit. If any glucose reading is under 80 or above 300 call your primary MD immediately.  If you have Seizure/Convulsions/Epilepsy: Please do not drive, operate heavy machinery, participate in activities at heights or participate in high speed sports until you have seen by Primary MD or a Neurologist and advised to do so again.  If you had Gastrointestinal Bleeding: Please ask your Primary MD to check a complete blood count within one week of discharge or at your next visit. Your endoscopic/colonoscopic biopsies that are pending at the time of discharge, will also need to followed by your Primary MD.  Get Medicines reviewed and adjusted. Please take all  your medications with you for your next visit with your Primary MD  Please request your Primary MD to go over all hospital tests and procedure/radiological results at the follow up, please ask your Primary MD to get all Hospital records sent to his/her office.  If you experience worsening of your admission symptoms, develop shortness of breath, life threatening emergency, suicidal or homicidal thoughts you must seek medical attention immediately by calling 911 or calling your MD immediately  if symptoms less severe.  You must read complete instructions/literature along with all the possible adverse reactions/side effects for all the Medicines you take and that have been prescribed to you. Take any new Medicines after you have completely understood and accpet all the possible adverse reactions/side effects.   Do not drive or operate heavy machinery when taking Pain medications.   Do not take more than prescribed Pain, Sleep and Anxiety Medications  Special Instructions: If you have smoked or chewed Tobacco  in the last 2 yrs please stop smoking, stop any regular Alcohol  and or any Recreational drug use.  Wear Seat belts while driving.  Please note You were cared for by a hospitalist during your hospital stay. If you have any questions about your discharge medications or the care you received while you were in the hospital after you are discharged, you can call the unit and asked to speak with the hospitalist on call if the hospitalist that took care of you is not available. Once  you are discharged, your primary care physician will handle any further medical issues. Please note that NO REFILLS for any discharge medications will be authorized once you are discharged, as it is imperative that you return to your primary care physician (or establish a relationship with a primary care physician if you do not have one) for your aftercare needs so that they can reassess your need for medications and monitor  your lab values.  You can reach the hospitalist office at phone [phone] or fax [phone]   If you do not have a primary care physician, you can call [phone] for a physician referral.  Activity: As tolerated with Full fall precautions use walker/cane & assistance as needed  Diet: heart healthy  Disposition Home

## 2016-12-07 NOTE — Progress Notes (Signed)
Spoke w patient's daughter at the bedside. She states that her mother lives at home alone, family provides a lot of care, either staying with her or patient staying with them. They cook meals, clean, and bathe her. Patient has DME shower seat and 3/1. Daughter expresses interest in [facility] prior to DC. Discussed private duty caregivers, referred to [facility] website to search for providers.

## 2016-12-07 NOTE — Care Management Obs Status (Signed)
MEDICARE OBSERVATION STATUS NOTIFICATION   Patient Details  Name: Crystal Patterson MRN: [id number] Date of Birth: 1965/11/29   Medicare Observation Status Notification Given:  Yes    Lawerance Sabal, RN 12/07/2016, 1:50 PM

## 2016-12-10 DIAGNOSIS — Z89011 Acquired absence of right thumb: Secondary | ICD-10-CM | POA: Diagnosis not present

## 2016-12-10 DIAGNOSIS — I252 Old myocardial infarction: Secondary | ICD-10-CM | POA: Diagnosis not present

## 2016-12-10 DIAGNOSIS — M329 Systemic lupus erythematosus, unspecified: Secondary | ICD-10-CM | POA: Diagnosis not present

## 2016-12-10 DIAGNOSIS — R251 Tremor, unspecified: Secondary | ICD-10-CM | POA: Diagnosis not present

## 2016-12-10 DIAGNOSIS — I10 Essential (primary) hypertension: Secondary | ICD-10-CM | POA: Diagnosis not present

## 2016-12-10 DIAGNOSIS — G8918 Other acute postprocedural pain: Secondary | ICD-10-CM | POA: Diagnosis not present

## 2016-12-10 DIAGNOSIS — Z79899 Other long term (current) drug therapy: Secondary | ICD-10-CM | POA: Diagnosis not present

## 2016-12-10 DIAGNOSIS — Z89021 Acquired absence of right finger(s): Secondary | ICD-10-CM | POA: Diagnosis not present

## 2016-12-11 LAB — CULTURE, BLOOD (ROUTINE X 2)
Culture: NO GROWTH
Culture: NO GROWTH
Special Requests: ADEQUATE
Special Requests: ADEQUATE

## 2016-12-16 DIAGNOSIS — I10 Essential (primary) hypertension: Secondary | ICD-10-CM | POA: Diagnosis not present

## 2016-12-16 DIAGNOSIS — Z79899 Other long term (current) drug therapy: Secondary | ICD-10-CM | POA: Diagnosis not present

## 2016-12-16 DIAGNOSIS — Z89021 Acquired absence of right finger(s): Secondary | ICD-10-CM | POA: Diagnosis not present

## 2016-12-16 DIAGNOSIS — Z89022 Acquired absence of left finger(s): Secondary | ICD-10-CM | POA: Diagnosis not present

## 2016-12-16 DIAGNOSIS — Z7982 Long term (current) use of aspirin: Secondary | ICD-10-CM | POA: Diagnosis not present

## 2016-12-16 DIAGNOSIS — L98494 Non-pressure chronic ulcer of skin of other sites with necrosis of bone: Secondary | ICD-10-CM | POA: Diagnosis not present

## 2016-12-16 DIAGNOSIS — Z4781 Encounter for orthopedic aftercare following surgical amputation: Secondary | ICD-10-CM | POA: Diagnosis not present

## 2016-12-21 DIAGNOSIS — Z791 Long term (current) use of non-steroidal anti-inflammatories (NSAID): Secondary | ICD-10-CM | POA: Diagnosis not present

## 2016-12-21 DIAGNOSIS — Z7982 Long term (current) use of aspirin: Secondary | ICD-10-CM | POA: Diagnosis not present

## 2016-12-21 DIAGNOSIS — I739 Peripheral vascular disease, unspecified: Secondary | ICD-10-CM | POA: Diagnosis not present

## 2016-12-21 DIAGNOSIS — I2510 Atherosclerotic heart disease of native coronary artery without angina pectoris: Secondary | ICD-10-CM | POA: Diagnosis not present

## 2016-12-21 DIAGNOSIS — Z89022 Acquired absence of left finger(s): Secondary | ICD-10-CM | POA: Diagnosis not present

## 2016-12-21 DIAGNOSIS — K219 Gastro-esophageal reflux disease without esophagitis: Secondary | ICD-10-CM | POA: Diagnosis not present

## 2016-12-21 DIAGNOSIS — Z79899 Other long term (current) drug therapy: Secondary | ICD-10-CM | POA: Diagnosis not present

## 2016-12-21 DIAGNOSIS — I10 Essential (primary) hypertension: Secondary | ICD-10-CM | POA: Diagnosis not present

## 2016-12-21 DIAGNOSIS — M3214 Glomerular disease in systemic lupus erythematosus: Secondary | ICD-10-CM | POA: Diagnosis not present

## 2016-12-21 DIAGNOSIS — L98499 Non-pressure chronic ulcer of skin of other sites with unspecified severity: Secondary | ICD-10-CM | POA: Diagnosis not present

## 2016-12-21 DIAGNOSIS — Z89021 Acquired absence of right finger(s): Secondary | ICD-10-CM | POA: Diagnosis not present

## 2016-12-21 DIAGNOSIS — M87841 Other osteonecrosis, right hand: Secondary | ICD-10-CM | POA: Diagnosis not present

## 2016-12-21 DIAGNOSIS — I7300 Raynaud's syndrome without gangrene: Secondary | ICD-10-CM | POA: Diagnosis not present

## 2016-12-21 DIAGNOSIS — M109 Gout, unspecified: Secondary | ICD-10-CM | POA: Diagnosis not present

## 2016-12-21 DIAGNOSIS — Z79891 Long term (current) use of opiate analgesic: Secondary | ICD-10-CM | POA: Diagnosis not present

## 2016-12-25 DIAGNOSIS — Z89022 Acquired absence of left finger(s): Secondary | ICD-10-CM | POA: Diagnosis not present

## 2016-12-25 DIAGNOSIS — Z89021 Acquired absence of right finger(s): Secondary | ICD-10-CM | POA: Diagnosis not present

## 2016-12-25 DIAGNOSIS — I10 Essential (primary) hypertension: Secondary | ICD-10-CM | POA: Diagnosis not present

## 2016-12-25 DIAGNOSIS — Z89012 Acquired absence of left thumb: Secondary | ICD-10-CM | POA: Diagnosis not present

## 2016-12-25 DIAGNOSIS — R51 Headache: Secondary | ICD-10-CM | POA: Diagnosis not present

## 2016-12-25 DIAGNOSIS — I252 Old myocardial infarction: Secondary | ICD-10-CM | POA: Diagnosis not present

## 2016-12-25 DIAGNOSIS — M329 Systemic lupus erythematosus, unspecified: Secondary | ICD-10-CM | POA: Diagnosis not present

## 2016-12-25 DIAGNOSIS — R0602 Shortness of breath: Secondary | ICD-10-CM | POA: Diagnosis not present

## 2016-12-25 DIAGNOSIS — Z79899 Other long term (current) drug therapy: Secondary | ICD-10-CM | POA: Diagnosis not present

## 2016-12-25 DIAGNOSIS — R062 Wheezing: Secondary | ICD-10-CM | POA: Diagnosis not present

## 2016-12-25 DIAGNOSIS — Z7982 Long term (current) use of aspirin: Secondary | ICD-10-CM | POA: Diagnosis not present

## 2016-12-25 DIAGNOSIS — K219 Gastro-esophageal reflux disease without esophagitis: Secondary | ICD-10-CM | POA: Diagnosis not present

## 2016-12-29 DIAGNOSIS — I10 Essential (primary) hypertension: Secondary | ICD-10-CM | POA: Diagnosis not present

## 2016-12-29 DIAGNOSIS — R739 Hyperglycemia, unspecified: Secondary | ICD-10-CM | POA: Diagnosis not present

## 2016-12-29 DIAGNOSIS — I739 Peripheral vascular disease, unspecified: Secondary | ICD-10-CM | POA: Diagnosis not present

## 2016-12-29 DIAGNOSIS — K5900 Constipation, unspecified: Secondary | ICD-10-CM | POA: Diagnosis not present

## 2016-12-29 DIAGNOSIS — Z79899 Other long term (current) drug therapy: Secondary | ICD-10-CM | POA: Diagnosis not present

## 2016-12-29 DIAGNOSIS — R011 Cardiac murmur, unspecified: Secondary | ICD-10-CM | POA: Diagnosis not present

## 2016-12-29 DIAGNOSIS — I252 Old myocardial infarction: Secondary | ICD-10-CM | POA: Diagnosis not present

## 2016-12-29 DIAGNOSIS — Z7982 Long term (current) use of aspirin: Secondary | ICD-10-CM | POA: Diagnosis not present

## 2016-12-29 DIAGNOSIS — R109 Unspecified abdominal pain: Secondary | ICD-10-CM | POA: Diagnosis not present

## 2016-12-29 DIAGNOSIS — M329 Systemic lupus erythematosus, unspecified: Secondary | ICD-10-CM | POA: Diagnosis not present

## 2016-12-29 DIAGNOSIS — K529 Noninfective gastroenteritis and colitis, unspecified: Secondary | ICD-10-CM | POA: Diagnosis not present

## 2016-12-30 DIAGNOSIS — E876 Hypokalemia: Secondary | ICD-10-CM | POA: Diagnosis not present

## 2016-12-30 DIAGNOSIS — K449 Diaphragmatic hernia without obstruction or gangrene: Secondary | ICD-10-CM | POA: Diagnosis not present

## 2016-12-30 DIAGNOSIS — K529 Noninfective gastroenteritis and colitis, unspecified: Secondary | ICD-10-CM | POA: Diagnosis not present

## 2016-12-30 DIAGNOSIS — R945 Abnormal results of liver function studies: Secondary | ICD-10-CM | POA: Diagnosis not present

## 2016-12-30 DIAGNOSIS — K219 Gastro-esophageal reflux disease without esophagitis: Secondary | ICD-10-CM | POA: Diagnosis not present

## 2016-12-30 DIAGNOSIS — R7989 Other specified abnormal findings of blood chemistry: Secondary | ICD-10-CM | POA: Diagnosis not present

## 2016-12-30 DIAGNOSIS — Z289 Immunization not carried out for unspecified reason: Secondary | ICD-10-CM | POA: Diagnosis not present

## 2016-12-30 DIAGNOSIS — M898X9 Other specified disorders of bone, unspecified site: Secondary | ICD-10-CM | POA: Insufficient documentation

## 2016-12-30 DIAGNOSIS — M329 Systemic lupus erythematosus, unspecified: Secondary | ICD-10-CM | POA: Diagnosis not present

## 2016-12-30 DIAGNOSIS — K5900 Constipation, unspecified: Secondary | ICD-10-CM | POA: Diagnosis not present

## 2016-12-30 DIAGNOSIS — K5909 Other constipation: Secondary | ICD-10-CM | POA: Diagnosis not present

## 2016-12-30 DIAGNOSIS — F1290 Cannabis use, unspecified, uncomplicated: Secondary | ICD-10-CM | POA: Insufficient documentation

## 2016-12-30 DIAGNOSIS — E889 Metabolic disorder, unspecified: Secondary | ICD-10-CM | POA: Diagnosis not present

## 2016-12-30 DIAGNOSIS — Z89022 Acquired absence of left finger(s): Secondary | ICD-10-CM | POA: Diagnosis not present

## 2016-12-30 DIAGNOSIS — R1084 Generalized abdominal pain: Secondary | ICD-10-CM | POA: Diagnosis not present

## 2016-12-30 DIAGNOSIS — Z89021 Acquired absence of right finger(s): Secondary | ICD-10-CM | POA: Diagnosis not present

## 2016-12-30 DIAGNOSIS — I10 Essential (primary) hypertension: Secondary | ICD-10-CM | POA: Diagnosis not present

## 2016-12-30 DIAGNOSIS — K5901 Slow transit constipation: Secondary | ICD-10-CM | POA: Diagnosis not present

## 2016-12-30 DIAGNOSIS — G894 Chronic pain syndrome: Secondary | ICD-10-CM | POA: Diagnosis not present

## 2016-12-30 DIAGNOSIS — R748 Abnormal levels of other serum enzymes: Secondary | ICD-10-CM | POA: Diagnosis not present

## 2016-12-30 DIAGNOSIS — D72829 Elevated white blood cell count, unspecified: Secondary | ICD-10-CM | POA: Diagnosis not present

## 2016-12-30 DIAGNOSIS — M9080 Osteopathy in diseases classified elsewhere, unspecified site: Secondary | ICD-10-CM | POA: Diagnosis not present

## 2016-12-30 DIAGNOSIS — E785 Hyperlipidemia, unspecified: Secondary | ICD-10-CM | POA: Diagnosis not present

## 2016-12-30 DIAGNOSIS — F17200 Nicotine dependence, unspecified, uncomplicated: Secondary | ICD-10-CM | POA: Insufficient documentation

## 2016-12-30 DIAGNOSIS — I252 Old myocardial infarction: Secondary | ICD-10-CM | POA: Diagnosis not present

## 2017-01-06 DIAGNOSIS — Z87891 Personal history of nicotine dependence: Secondary | ICD-10-CM | POA: Diagnosis not present

## 2017-01-06 DIAGNOSIS — Z79899 Other long term (current) drug therapy: Secondary | ICD-10-CM | POA: Diagnosis not present

## 2017-01-06 DIAGNOSIS — Z4802 Encounter for removal of sutures: Secondary | ICD-10-CM | POA: Diagnosis not present

## 2017-01-06 DIAGNOSIS — Z4781 Encounter for orthopedic aftercare following surgical amputation: Secondary | ICD-10-CM | POA: Diagnosis not present

## 2017-01-06 DIAGNOSIS — L98494 Non-pressure chronic ulcer of skin of other sites with necrosis of bone: Secondary | ICD-10-CM | POA: Diagnosis not present

## 2017-01-06 DIAGNOSIS — Z89021 Acquired absence of right finger(s): Secondary | ICD-10-CM | POA: Diagnosis not present

## 2017-01-06 DIAGNOSIS — Z89022 Acquired absence of left finger(s): Secondary | ICD-10-CM | POA: Diagnosis not present

## 2017-01-06 DIAGNOSIS — Z7982 Long term (current) use of aspirin: Secondary | ICD-10-CM | POA: Diagnosis not present

## 2017-01-06 DIAGNOSIS — I10 Essential (primary) hypertension: Secondary | ICD-10-CM | POA: Diagnosis not present

## 2017-01-17 DIAGNOSIS — I7300 Raynaud's syndrome without gangrene: Secondary | ICD-10-CM | POA: Diagnosis not present

## 2017-01-17 DIAGNOSIS — I10 Essential (primary) hypertension: Secondary | ICD-10-CM | POA: Diagnosis not present

## 2017-01-17 DIAGNOSIS — Z89029 Acquired absence of unspecified finger(s): Secondary | ICD-10-CM | POA: Diagnosis not present

## 2017-01-17 DIAGNOSIS — M329 Systemic lupus erythematosus, unspecified: Secondary | ICD-10-CM | POA: Diagnosis not present

## 2017-01-17 DIAGNOSIS — Z1389 Encounter for screening for other disorder: Secondary | ICD-10-CM | POA: Diagnosis not present

## 2017-01-19 DIAGNOSIS — N189 Chronic kidney disease, unspecified: Secondary | ICD-10-CM | POA: Diagnosis not present

## 2017-01-19 DIAGNOSIS — I7301 Raynaud's syndrome with gangrene: Secondary | ICD-10-CM | POA: Diagnosis not present

## 2017-01-19 DIAGNOSIS — I129 Hypertensive chronic kidney disease with stage 1 through stage 4 chronic kidney disease, or unspecified chronic kidney disease: Secondary | ICD-10-CM | POA: Diagnosis not present

## 2017-01-19 DIAGNOSIS — M329 Systemic lupus erythematosus, unspecified: Secondary | ICD-10-CM | POA: Diagnosis not present

## 2017-01-19 DIAGNOSIS — Z89029 Acquired absence of unspecified finger(s): Secondary | ICD-10-CM | POA: Diagnosis not present

## 2017-02-05 DIAGNOSIS — I252 Old myocardial infarction: Secondary | ICD-10-CM | POA: Diagnosis not present

## 2017-02-05 DIAGNOSIS — Z79899 Other long term (current) drug therapy: Secondary | ICD-10-CM | POA: Diagnosis not present

## 2017-02-05 DIAGNOSIS — M25562 Pain in left knee: Secondary | ICD-10-CM | POA: Diagnosis not present

## 2017-02-05 DIAGNOSIS — I10 Essential (primary) hypertension: Secondary | ICD-10-CM | POA: Diagnosis not present

## 2017-02-05 DIAGNOSIS — M25561 Pain in right knee: Secondary | ICD-10-CM | POA: Diagnosis not present

## 2017-02-05 DIAGNOSIS — Z8249 Family history of ischemic heart disease and other diseases of the circulatory system: Secondary | ICD-10-CM | POA: Diagnosis not present

## 2017-02-28 DIAGNOSIS — M329 Systemic lupus erythematosus, unspecified: Secondary | ICD-10-CM | POA: Diagnosis not present

## 2017-02-28 DIAGNOSIS — I10 Essential (primary) hypertension: Secondary | ICD-10-CM | POA: Diagnosis not present

## 2017-02-28 DIAGNOSIS — Z89021 Acquired absence of right finger(s): Secondary | ICD-10-CM | POA: Diagnosis not present

## 2017-02-28 DIAGNOSIS — Z7951 Long term (current) use of inhaled steroids: Secondary | ICD-10-CM | POA: Diagnosis not present

## 2017-02-28 DIAGNOSIS — Z7982 Long term (current) use of aspirin: Secondary | ICD-10-CM | POA: Diagnosis not present

## 2017-02-28 DIAGNOSIS — I7300 Raynaud's syndrome without gangrene: Secondary | ICD-10-CM | POA: Diagnosis not present

## 2017-07-14 DIAGNOSIS — I2510 Atherosclerotic heart disease of native coronary artery without angina pectoris: Secondary | ICD-10-CM | POA: Insufficient documentation

## 2017-09-14 ENCOUNTER — Other Ambulatory Visit (HOSPITAL_COMMUNITY): Payer: Self-pay | Admitting: Internal Medicine

## 2017-09-14 DIAGNOSIS — N289 Disorder of kidney and ureter, unspecified: Secondary | ICD-10-CM

## 2017-09-19 ENCOUNTER — Other Ambulatory Visit (HOSPITAL_COMMUNITY): Payer: Self-pay | Admitting: Internal Medicine

## 2017-09-19 ENCOUNTER — Other Ambulatory Visit (HOSPITAL_COMMUNITY)
Admission: RE | Admit: 2017-09-19 | Discharge: 2017-09-19 | Disposition: A | Discharge status: Home / Self Care | Payer: 59 | Source: Ambulatory Visit | Attending: Internal Medicine | Admitting: Internal Medicine

## 2017-09-19 ENCOUNTER — Ambulatory Visit (HOSPITAL_COMMUNITY)
Admission: RE | Admit: 2017-09-19 | Discharge: 2017-09-19 | Disposition: A | Discharge status: Home / Self Care | Payer: 59 | Source: Ambulatory Visit | Attending: Internal Medicine | Admitting: Internal Medicine

## 2017-09-19 DIAGNOSIS — R7989 Other specified abnormal findings of blood chemistry: Secondary | ICD-10-CM

## 2017-09-19 DIAGNOSIS — R945 Abnormal results of liver function studies: Secondary | ICD-10-CM | POA: Insufficient documentation

## 2017-09-19 DIAGNOSIS — N289 Disorder of kidney and ureter, unspecified: Secondary | ICD-10-CM

## 2017-09-20 LAB — HEPATITIS A ANTIBODY, IGM: Hep A IgM: NEGATIVE

## 2017-09-20 LAB — HEPATITIS B SURFACE ANTIBODY, QUANTITATIVE: Hepatitis B-Post: <3.1 m[IU]/mL — ABNORMAL LOW (ref >9.9)

## 2017-09-20 LAB — HEPATITIS B SURFACE ANTIGEN: Hepatitis B Surface Ag: NEGATIVE

## 2017-11-08 ENCOUNTER — Ambulatory Visit (INDEPENDENT_AMBULATORY_CARE_PROVIDER_SITE_OTHER): Payer: Self-pay

## 2017-11-08 DIAGNOSIS — Z1211 Encounter for screening for malignant neoplasm of colon: Secondary | ICD-10-CM

## 2017-11-08 MED ORDER — NA SULFATE-K SULFATE-MG SULF 17.5-3.13-1.6 GM/177ML PO SOLN: 1.0000 | ORAL | 0 refills | Status: DC

## 2017-11-08 NOTE — Progress Notes (Addendum)
Gastroenterology Pre-Procedure Review  Request Date:11/08/17 Requesting Physician: Dr.Fanta No previous tcs  PATIENT REVIEW QUESTIONS: The patient responded to the following health history questions as indicated:    1. Diabetes Melitis: no 2. Joint replacements in the past 12 months: no 3. Major health problems in the past 3 months: no 4. Has an artificial valve or MVP: no 5. Has a defibrillator: no 6. Has been advised in past to take antibiotics in advance of a procedure like teeth cleaning: no 7. Family history of colon cancer: no  8. Alcohol Use: yes (occasionally) 9. History of sleep apnea: no  10. History of coronary artery or other vascular stents placed within the last 12 months: no 11. History of any prior anesthesia complications: no    MEDICATIONS & ALLERGIES:    Patient reports the following regarding taking any blood thinners:   Plavix? no Aspirin? yes (81mg ) Coumadin? no Brilinta? no Xarelto? no Eliquis? no Pradaxa? no Savaysa? no Effient? no  Patient confirms/reports the following medications:  Current Outpatient Medications  Medication Sig Dispense Refill  . allopurinol (ZYLOPRIM) 100 MG tablet Take 1 tablet (100 mg total) by mouth daily. 30 tablet 3  . amLODipine (NORVASC) 10 MG tablet Take 1 tablet (10 mg total) by mouth daily. 30 tablet 11  . aspirin EC 81 MG EC tablet Take 1 tablet (81 mg total) by mouth daily.    [name] atenolol (TENORMIN) 25 MG tablet Take 1 tablet (25 mg total) by mouth daily. 30 tablet 11  . atorvastatin (LIPITOR) 40 MG tablet Take 1 tablet (40 mg total) by mouth daily at 6 PM. 30 tablet 11  . benazepril (LOTENSIN) 40 MG tablet Take 1 tablet (40 mg total) by mouth daily. 30 tablet 11  . doxycycline (VIBRAMYCIN) 100 MG capsule Take 1 capsule (100 mg total) by mouth 2 (two) times daily. 10 capsule 0  . furosemide (LASIX) 40 MG tablet Take 1 tablet (40 mg total) by mouth daily. 30 tablet 11  . ibuprofen (ADVIL,MOTRIN) 800 MG tablet Take 800 mg  by mouth as needed.    . magnesium oxide (MAG-OX) 400 MG tablet Take 400 mg by mouth daily. TAKE DAILY FOR 4 DAYS STARTING 06/10/16    . potassium chloride (K-DUR) 10 MEQ tablet Take 10 mEq by mouth 2 (two) times daily.    [name] spironolactone (ALDACTONE) 25 MG tablet Take 1 tablet (25 mg total) by mouth 2 (two) times daily. 30 tablet 11   No current facility-administered medications for this visit.     Patient confirms/reports the following allergies:  No Known Allergies  No orders of the defined types were placed in this encounter.   AUTHORIZATION INFORMATION Primary Insurance: [employer],  ID #: [id number] Pre-Cert / Auth required:yes Pre-Cert / Berkley Harvey #:[id number]   SCHEDULE INFORMATION: Procedure has been scheduled as follows:  Date: 12/28/17, Time:12:15 Location: [facility] Dr.Rourk  This Gastroenterology Pre-Precedure Review Form is being routed to the following provider(s): Wynne Dust NP

## 2017-11-08 NOTE — Patient Instructions (Signed)
Crystal Patterson  [birth year] MRN: [id number]     Procedure Date: 12/28/17 Time to register: 11:15am Place to register: [facility] Procedure Time: 12:15pm Scheduled provider: R. Garfield Cornea, MD    PREPARATION FOR COLONOSCOPY WITH SUPREP BOWEL PREP KIT  Note: Suprep Bowel Prep Kit is a split-dose (2day) regimen. Consumption of BOTH 6-ounce bottles is required for a complete prep.  Please notify us immediately if you are diabetic, take iron supplements, or if you are on Coumadin or any other blood thinners.                                                                                                                                                  2 DAYS BEFORE PROCEDURE:  DATE: 12/26/17  DAY: Monday Begin clear liquid diet AFTER your lunch meal. NO SOLID FOODS after this point.  1 DAY BEFORE PROCEDURE:  DATE: 12/27/17  DAY: Tuesday Continue clear liquids the entire day - NO SOLID FOOD.     At 6:00pm: Complete steps 1 through 4 below, using ONE (1) 6-ounce bottle, before going to bed. Step 1:  Pour ONE (1) 6-ounce bottle of SUPREP liquid into the mixing container.  Step 2:  Add cool drinking water to the 16 ounce line on the container and mix.  Note: Dilute the solution concentrate as directed prior to use. Step 3:  DRINK ALL the liquid in the container. Step 4:  You MUST drink an additional two (2) or more 16 ounce containers of water over the next one (1) hour.   Continue clear liquids.  DAY OF PROCEDURE:   DATE: 12/28/17  DAY: Wednesday If you take medications for your heart, blood pressure, or breathing, you may take these medications.    5 hours before your procedure at :7:15am Step 1:  Pour ONE (1) 6-ounce bottle of SUPREP liquid into the mixing container.  Step 2:  Add cool drinking water to the 16 ounce line on the container and mix.  Note: Dilute the solution concentrate as directed prior to use. Step 3:  DRINK ALL the liquid in the container. Step 4:   You MUST drink an additional two (2) or more 16 ounce containers of water over the next one (1) hour. You MUST complete the final glass of water at least 3 hours before your colonoscopy.   Nothing by mouth past: 9:15am  You may take your morning medications with sip of water unless we have instructed otherwise.    Please see below for Dietary Information.  CLEAR LIQUIDS INCLUDE:  Water Jello (NOT red in color)   Ice Popsicles (NOT red in color)   Tea (sugar ok, no milk/cream) Powdered fruit flavored drinks  Coffee (sugar ok, no milk/cream) Gatorade/ Lemonade/ Kool-Aid  (NOT red in color)   Juice: apple, white grape, white cranberry Soft drinks  Clear bullion, consomme, broth (fat free beef/chicken/vegetable)  Carbonated beverages (any kind)  Strained chicken noodle soup Hard Candy   Remember: Clear liquids are liquids that will allow you to see your fingers on the other side of a clear glass. Be sure liquids are NOT red in color, and not cloudy, but CLEAR.  DO NOT EAT OR DRINK ANY OF THE FOLLOWING:  Dairy products of any kind   Cranberry juice Tomato juice / V8 juice   Grapefruit juice Orange juice     Red grape juice  Do not eat any solid foods, including such foods as: cereal, oatmeal, yogurt, fruits, vegetables, creamed soups, eggs, bread, crackers, pureed foods in a blender, etc.   HELPFUL HINTS FOR DRINKING PREP SOLUTION:   Make sure prep is extremely cold. Mix and refrigerate the the morning of the prep. You may also put in the freezer.   You may try mixing some Crystal Light or Country Time Lemonade if you prefer. Mix in small amounts; add more if necessary.  Try drinking through a straw  Rinse mouth with water or a mouthwash between glasses, to remove after-taste.  Try sipping on a cold beverage /ice/ popsicles between glasses of prep.  Place a piece of sugar-free hard candy in mouth between glasses.  If you become nauseated, try consuming smaller amounts, or  stretch out the time between glasses. Stop for 30-60 minutes, then slowly start back drinking.     OTHER INSTRUCTIONS  You will need a responsible adult at least 51 years of age to accompany you and drive you home. This person must remain in the waiting room during your procedure. The hospital will cancel your procedure if you do not have a responsible adult with you.   1. Wear loose fitting clothing that is easily removed. 2. Leave jewelry and other valuables at home.  3. Remove all body piercing jewelry and leave at home. 4. Total time from sign-in until discharge is approximately 2-3 hours. 5. You should go home directly after your procedure and rest. You can resume normal activities the day after your procedure. 6. The day of your procedure you should not:  Drive  Make legal decisions  Operate machinery  Drink alcohol  Return to work   You may call the office (Dept: [phone]) before 5:00pm, or page the doctor on call [phone]) after 5:00pm, for further instructions, if necessary.   Insurance Information YOU WILL NEED TO CHECK WITH YOUR INSURANCE COMPANY FOR THE BENEFITS OF COVERAGE YOU HAVE FOR THIS PROCEDURE.  UNFORTUNATELY, NOT ALL INSURANCE COMPANIES HAVE BENEFITS TO COVER ALL OR PART OF THESE TYPES OF PROCEDURES.  IT IS YOUR RESPONSIBILITY TO CHECK YOUR BENEFITS, HOWEVER, WE WILL BE GLAD TO ASSIST YOU WITH ANY CODES YOUR INSURANCE COMPANY MAY NEED.    PLEASE NOTE THAT MOST INSURANCE COMPANIES WILL NOT COVER A SCREENING COLONOSCOPY FOR PEOPLE UNDER THE AGE OF 50  IF YOU HAVE BCBS INSURANCE, YOU MAY HAVE BENEFITS FOR A SCREENING COLONOSCOPY BUT IF POLYPS ARE FOUND THE DIAGNOSIS WILL CHANGE AND THEN YOU MAY HAVE A DEDUCTIBLE THAT WILL NEED TO BE MET. SO PLEASE MAKE SURE YOU CHECK YOUR BENEFITS FOR A SCREENING COLONOSCOPY AS WELL AS A DIAGNOSTIC COLONOSCOPY.

## 2017-11-10 ENCOUNTER — Telehealth: Payer: Self-pay | Admitting: Internal Medicine

## 2017-11-10 NOTE — Telephone Encounter (Signed)
Pt called asking to speak with JL. Please call her at [phone]

## 2017-11-10 NOTE — Telephone Encounter (Signed)
Pt had questions about her prep. I answered them for her and went over her instructions. She stated she understood.

## 2017-11-10 NOTE — Progress Notes (Signed)
Ok to schedule.

## 2017-12-26 ENCOUNTER — Telehealth: Payer: Self-pay | Admitting: Gastroenterology

## 2017-12-26 NOTE — Telephone Encounter (Signed)
Pt called for JL. She has questions about her prep. Please call her back at [phone]

## 2017-12-26 NOTE — Telephone Encounter (Signed)
Called pt and answered her questions 

## 2017-12-27 ENCOUNTER — Telehealth: Payer: Self-pay

## 2017-12-27 ENCOUNTER — Other Ambulatory Visit: Payer: Self-pay | Admitting: Gastroenterology

## 2017-12-27 MED ORDER — NA SULFATE-K SULFATE-MG SULF 17.5-3.13-1.6 GM/177ML PO SOLN: 1.0000 | ORAL | 0 refills | Status: DC

## 2017-12-27 NOTE — Telephone Encounter (Signed)
I spoke with the patient and she stated that she had Mac and cheese today.  She also did not start her prep on the correct day.  We have her rescheduled to 03/08/2018 at 9:30 am.  I will follow up with her in January to review instructions.  Almyra Free will call Endoscopy to reschedule procedure, mail instructions and send in a new prep.

## 2017-12-27 NOTE — Telephone Encounter (Signed)
rx sent to the pharmacy, new instructions done and mailed to the pt. Called endo and moved procedure to January.

## 2017-12-27 NOTE — Telephone Encounter (Signed)
NURSE LISA CALLED AND SAID THAT PATIENT ALREADY DRANK HER PREP AND ATE MAC N CHEESE.  PLEASE CALL PATIENT.

## 2018-03-01 DIAGNOSIS — I209 Angina pectoris, unspecified: Secondary | ICD-10-CM

## 2018-03-01 HISTORY — DX: Angina pectoris, unspecified: I20.9

## 2018-03-06 NOTE — Telephone Encounter (Signed)
I spoke with the patient and she stated that she would like to cancel her tcs.  She said she's having problems with her Medicaid and will call back to reschedule.

## 2018-03-08 ENCOUNTER — Ambulatory Visit (HOSPITAL_COMMUNITY): Admission: RE | Admit: 2018-03-08 | Payer: 59 | Source: Ambulatory Visit | Admitting: Internal Medicine

## 2018-03-08 ENCOUNTER — Encounter (HOSPITAL_COMMUNITY): Admission: RE | Payer: Self-pay | Source: Ambulatory Visit

## 2018-03-08 SURGERY — COLONOSCOPY
Anesthesia: Moderate Sedation

## 2018-03-15 DIAGNOSIS — I10 Essential (primary) hypertension: Secondary | ICD-10-CM | POA: Diagnosis not present

## 2018-03-15 DIAGNOSIS — M329 Systemic lupus erythematosus, unspecified: Secondary | ICD-10-CM | POA: Diagnosis not present

## 2018-03-15 DIAGNOSIS — Z89029 Acquired absence of unspecified finger(s): Secondary | ICD-10-CM | POA: Diagnosis not present

## 2018-03-15 DIAGNOSIS — N189 Chronic kidney disease, unspecified: Secondary | ICD-10-CM | POA: Diagnosis not present

## 2018-05-17 ENCOUNTER — Telehealth: Payer: Self-pay | Admitting: Internal Medicine

## 2018-05-17 NOTE — Telephone Encounter (Signed)
I called the pt and explained to her that we were not able to schedule any screening colonoscopies at this time due to the corona virus. Pt stated she understood and said she would call back.

## 2018-05-17 NOTE — Telephone Encounter (Signed)
[phone]  PATIENT CALLED AND SAID SHE IS READY TO SCHEDULE HER PROCEDURE

## 2018-06-14 DIAGNOSIS — M329 Systemic lupus erythematosus, unspecified: Secondary | ICD-10-CM | POA: Diagnosis not present

## 2018-06-14 DIAGNOSIS — N189 Chronic kidney disease, unspecified: Secondary | ICD-10-CM | POA: Diagnosis not present

## 2018-06-14 DIAGNOSIS — I10 Essential (primary) hypertension: Secondary | ICD-10-CM | POA: Diagnosis not present

## 2018-06-14 DIAGNOSIS — Z89029 Acquired absence of unspecified finger(s): Secondary | ICD-10-CM | POA: Diagnosis not present

## 2018-09-14 DIAGNOSIS — Z89029 Acquired absence of unspecified finger(s): Secondary | ICD-10-CM | POA: Diagnosis not present

## 2018-09-14 DIAGNOSIS — I10 Essential (primary) hypertension: Secondary | ICD-10-CM | POA: Diagnosis not present

## 2018-09-14 DIAGNOSIS — Z1389 Encounter for screening for other disorder: Secondary | ICD-10-CM | POA: Diagnosis not present

## 2018-09-14 DIAGNOSIS — I7301 Raynaud's syndrome with gangrene: Secondary | ICD-10-CM | POA: Diagnosis not present

## 2018-09-14 DIAGNOSIS — Z0001 Encounter for general adult medical examination with abnormal findings: Secondary | ICD-10-CM | POA: Diagnosis not present

## 2018-09-20 ENCOUNTER — Encounter: Payer: Self-pay | Admitting: *Deleted

## 2018-09-20 DIAGNOSIS — I10 Essential (primary) hypertension: Secondary | ICD-10-CM | POA: Diagnosis not present

## 2018-09-20 DIAGNOSIS — Z0001 Encounter for general adult medical examination with abnormal findings: Secondary | ICD-10-CM | POA: Diagnosis not present

## 2018-09-21 DIAGNOSIS — M329 Systemic lupus erythematosus, unspecified: Secondary | ICD-10-CM | POA: Diagnosis not present

## 2018-09-21 DIAGNOSIS — Z89029 Acquired absence of unspecified finger(s): Secondary | ICD-10-CM | POA: Diagnosis not present

## 2018-09-21 DIAGNOSIS — I10 Essential (primary) hypertension: Secondary | ICD-10-CM | POA: Diagnosis not present

## 2018-09-21 DIAGNOSIS — I739 Peripheral vascular disease, unspecified: Secondary | ICD-10-CM | POA: Diagnosis not present

## 2018-10-15 DIAGNOSIS — I10 Essential (primary) hypertension: Secondary | ICD-10-CM | POA: Diagnosis not present

## 2018-10-15 DIAGNOSIS — M329 Systemic lupus erythematosus, unspecified: Secondary | ICD-10-CM | POA: Diagnosis not present

## 2018-10-26 DIAGNOSIS — Z124 Encounter for screening for malignant neoplasm of cervix: Secondary | ICD-10-CM | POA: Diagnosis not present

## 2018-11-13 ENCOUNTER — Other Ambulatory Visit: Payer: Self-pay

## 2018-11-13 ENCOUNTER — Ambulatory Visit (INDEPENDENT_AMBULATORY_CARE_PROVIDER_SITE_OTHER): Payer: Self-pay | Admitting: *Deleted

## 2018-11-13 DIAGNOSIS — Z1211 Encounter for screening for malignant neoplasm of colon: Secondary | ICD-10-CM

## 2018-11-13 NOTE — Progress Notes (Signed)
Gastroenterology Pre-Procedure Review  Request Date: 11/13/2018 Requesting Physician: Dr. Legrand Rams, no previous TCS  PATIENT REVIEW QUESTIONS: The patient responded to the following health history questions as indicated:    1. Diabetes Melitis: No 2. Joint replacements in the past 12 months: No 3. Major health problems in the past 3 months: No 4. Has an artificial valve or MVP: No 5. Has a defibrillator: No 6. Has been advised in past to take antibiotics in advance of a procedure like teeth cleaning: No 7. Family history of colon cancer: No  8. Alcohol Use: Yes, 3 beers a week and marijuana use 9. History of sleep apnea: No, but takes has to take Ambien nightly  10. History of coronary artery or other vascular stents placed within the last 12 months: No 11. History of any prior anesthesia complications: No    MEDICATIONS & ALLERGIES:    Patient reports the following regarding taking any blood thinners:   Plavix? No Aspirin? Yes Coumadin? No Brilinta? No Xarelto? No Eliquis? No Pradaxa? No Savaysa? No Effient? No  Patient confirms/reports the following medications:  Current Outpatient Medications  Medication Sig Dispense Refill  . albuterol (PROVENTIL HFA;VENTOLIN HFA) 108 (90 Base) MCG/ACT inhaler Inhale 2 puffs into the lungs every 6 (six) hours as needed for wheezing or shortness of breath.    . allopurinol (ZYLOPRIM) 100 MG tablet Take 1 tablet (100 mg total) by mouth daily. 30 tablet 3  . amLODipine (NORVASC) 10 MG tablet Take 1 tablet (10 mg total) by mouth daily. 30 tablet 11  . aspirin EC 81 MG EC tablet Take 1 tablet (81 mg total) by mouth daily.    [name] atenolol (TENORMIN) 25 MG tablet Take 1 tablet (25 mg total) by mouth daily. 30 tablet 11  . atorvastatin (LIPITOR) 40 MG tablet Take 1 tablet (40 mg total) by mouth daily at 6 PM. 30 tablet 11  . benazepril (LOTENSIN) 40 MG tablet Take 1 tablet (40 mg total) by mouth daily. 30 tablet 11  . furosemide (LASIX) 40 MG tablet  Take 1 tablet (40 mg total) by mouth daily. 30 tablet 11  . gabapentin (NEURONTIN) 300 MG capsule Take 300 mg by mouth daily. Takes 2 tablets daily    . ibuprofen (ADVIL,MOTRIN) 800 MG tablet Take 800 mg by mouth as needed.     . potassium chloride (K-DUR) 10 MEQ tablet Take 10 mEq by mouth 2 (two) times daily.    [name] spironolactone (ALDACTONE) 25 MG tablet Take 1 tablet (25 mg total) by mouth 2 (two) times daily. (Patient taking differently: Take 25 mg by mouth daily. ) 30 tablet 11   No current facility-administered medications for this visit.     Patient confirms/reports the following allergies:  No Known Allergies  No orders of the defined types were placed in this encounter.   AUTHORIZATION INFORMATION Primary Insurance: [employer] Medicare,  ID #: [id number] ,  Group #: [id number] Pre-Cert / Auth required: No, not required   Secondary Insurance: Medicaid [place],  ID #: [id number] Pre-Cert / Auth required: No, not required  SCHEDULE INFORMATION: Procedure has been scheduled as follows:  Date: 01/17/2019, Time: 10:30 Location: [facility] with Dr. Gala Romney  This Gastroenterology Pre-Precedure Review Form is being routed to the following provider(s): Neil Crouch, PA-C

## 2018-11-14 NOTE — Progress Notes (Signed)
Ok to schedule.

## 2018-11-15 DIAGNOSIS — N189 Chronic kidney disease, unspecified: Secondary | ICD-10-CM | POA: Diagnosis not present

## 2018-11-15 DIAGNOSIS — I10 Essential (primary) hypertension: Secondary | ICD-10-CM | POA: Diagnosis not present

## 2018-11-15 MED ORDER — NA SULFATE-K SULFATE-MG SULF 17.5-3.13-1.6 GM/177ML PO SOLN: 1.0000 | Freq: Once | ORAL | 0 refills | Status: AC

## 2018-11-15 NOTE — Addendum Note (Signed)
Addended by: Metro Kung on: 11/15/2018 09:15 AM   Modules accepted: Orders, SmartSet

## 2018-11-15 NOTE — Patient Instructions (Signed)
Crystal Patterson  1965-07-27 MRN: [id number]     Procedure Date: 01/17/2019 Time to register: 9:30 am Place to register: [facility] Procedure Time: 10:30 am Scheduled provider: Dr. Gala Romney    PREPARATION FOR COLONOSCOPY WITH SUPREP BOWEL PREP KIT  Note: Suprep Bowel Prep Kit is a split-dose (2day) regimen. Consumption of BOTH 6-ounce bottles is required for a complete prep.  Please notify us immediately if you are diabetic, take iron supplements, or if you are on Coumadin or any other blood thinners.                                                                                                                                                  2 DAYS BEFORE PROCEDURE:  DATE: 01/15/2019   DAY: Monday Begin clear liquid diet AFTER your lunch meal. NO SOLID FOODS after this point.  1 DAY BEFORE PROCEDURE:  DATE: 01/16/2019   DAY: Tuesday Continue clear liquids the entire day - NO SOLID FOOD.    At 6:00pm: Complete steps 1 through 4 below, using ONE (1) 6-ounce bottle, before going to bed. Step 1:  Pour ONE (1) 6-ounce bottle of SUPREP liquid into the mixing container.  Step 2:  Add cool drinking water to the 16 ounce line on the container and mix.  Note: Dilute the solution concentrate as directed prior to use. Step 3:  DRINK ALL the liquid in the container. Step 4:  You MUST drink an additional two (2) or more 16 ounce containers of water over the next one (1) hour.   Continue clear liquids.  DAY OF PROCEDURE:   DATE: 01/17/2019   DAY: Wednesday If you take medications for your heart, blood pressure, or breathing, you may take these medications.   5 hours before your procedure at : 5:30 am Step 1:  Pour ONE (1) 6-ounce bottle of SUPREP liquid into the mixing container.  Step 2:  Add cool drinking water to the 16 ounce line on the container and mix.  Note: Dilute the solution concentrate as directed prior to use. Step 3:  DRINK ALL the liquid in the container. Step 4:   You MUST drink an additional two (2) or more 16 ounce containers of water over the next one (1) hour. You MUST complete the final glass of water at least 3 hours before your colonoscopy. Nothing by mouth past 7:30 am   You may take your morning medications with sip of water unless we have instructed otherwise.    Please see below for Dietary Information.  CLEAR LIQUIDS INCLUDE:  Water Jello (NOT red in color)   Ice Popsicles (NOT red in color)   Tea (sugar ok, no milk/cream) Powdered fruit flavored drinks  Coffee (sugar ok, no milk/cream) Gatorade/ Lemonade/ Kool-Aid  (NOT red in color)   Juice: apple, white grape, white cranberry  Soft drinks  Clear bullion, consomme, broth (fat free beef/chicken/vegetable)  Carbonated beverages (any kind)  Strained chicken noodle soup Hard Candy   Remember: Clear liquids are liquids that will allow you to see your fingers on the other side of a clear glass. Be sure liquids are NOT red in color, and not cloudy, but CLEAR.  DO NOT EAT OR DRINK ANY OF THE FOLLOWING:  Dairy products of any kind   Cranberry juice Tomato juice / V8 juice   Grapefruit juice Orange juice     Red grape juice  Do not eat any solid foods, including such foods as: cereal, oatmeal, yogurt, fruits, vegetables, creamed soups, eggs, bread, crackers, pureed foods in a blender, etc.   HELPFUL HINTS FOR DRINKING PREP SOLUTION:   Make sure prep is extremely cold. Mix and refrigerate the the morning of the prep. You may also put in the freezer.   You may try mixing some Crystal Light or Country Time Lemonade if you prefer. Mix in small amounts; add more if necessary.  Try drinking through a straw  Rinse mouth with water or a mouthwash between glasses, to remove after-taste.  Try sipping on a cold beverage /ice/ popsicles between glasses of prep.  Place a piece of sugar-free hard candy in mouth between glasses.  If you become nauseated, try consuming smaller amounts, or stretch  out the time between glasses. Stop for 30-60 minutes, then slowly start back drinking.     OTHER INSTRUCTIONS  You will need a responsible adult at least 52 years of age to accompany you and drive you home. This person must remain in the waiting room during your procedure. The hospital will cancel your procedure if you do not have a responsible adult with you.   1. Wear loose fitting clothing that is easily removed. 2. Leave jewelry and other valuables at home.  3. Remove all body piercing jewelry and leave at home. 4. Total time from sign-in until discharge is approximately 2-3 hours. 5. You should go home directly after your procedure and rest. You can resume normal activities the day after your procedure. 6. The day of your procedure you should not:  Drive  Make legal decisions  Operate machinery  Drink alcohol  Return to work   You may call the office (Dept: [phone]) before 5:00pm, or page the doctor on call [phone]) after 5:00pm, for further instructions, if necessary.   Insurance Information YOU WILL NEED TO CHECK WITH YOUR INSURANCE COMPANY FOR THE BENEFITS OF COVERAGE YOU HAVE FOR THIS PROCEDURE.  UNFORTUNATELY, NOT ALL INSURANCE COMPANIES HAVE BENEFITS TO COVER ALL OR PART OF THESE TYPES OF PROCEDURES.  IT IS YOUR RESPONSIBILITY TO CHECK YOUR BENEFITS, HOWEVER, WE WILL BE GLAD TO ASSIST YOU WITH ANY CODES YOUR INSURANCE COMPANY MAY NEED.    PLEASE NOTE THAT MOST INSURANCE COMPANIES WILL NOT COVER A SCREENING COLONOSCOPY FOR PEOPLE UNDER THE AGE OF 50  IF YOU HAVE BCBS INSURANCE, YOU MAY HAVE BENEFITS FOR A SCREENING COLONOSCOPY BUT IF POLYPS ARE FOUND THE DIAGNOSIS WILL CHANGE AND THEN YOU MAY HAVE A DEDUCTIBLE THAT WILL NEED TO BE MET. SO PLEASE MAKE SURE YOU CHECK YOUR BENEFITS FOR A SCREENING COLONOSCOPY AS WELL AS A DIAGNOSTIC COLONOSCOPY.

## 2018-12-12 DIAGNOSIS — I10 Essential (primary) hypertension: Secondary | ICD-10-CM | POA: Diagnosis not present

## 2018-12-12 DIAGNOSIS — N189 Chronic kidney disease, unspecified: Secondary | ICD-10-CM | POA: Diagnosis not present

## 2019-01-04 DIAGNOSIS — H5213 Myopia, bilateral: Secondary | ICD-10-CM | POA: Diagnosis not present

## 2019-01-05 ENCOUNTER — Telehealth: Payer: Self-pay | Admitting: *Deleted

## 2019-01-05 NOTE — Telephone Encounter (Signed)
Pt called in with questions about when to start her prep.  She also asked when and where to go to have her Covid screening done.  All questions were answered and all information was reviewed again with pt.  Pt was also made aware that she must have a family member or friend to take her for her procedure.

## 2019-01-12 DIAGNOSIS — M329 Systemic lupus erythematosus, unspecified: Secondary | ICD-10-CM | POA: Diagnosis not present

## 2019-01-12 DIAGNOSIS — I10 Essential (primary) hypertension: Secondary | ICD-10-CM | POA: Diagnosis not present

## 2019-01-15 ENCOUNTER — Other Ambulatory Visit: Payer: Self-pay

## 2019-01-15 ENCOUNTER — Other Ambulatory Visit (HOSPITAL_COMMUNITY)
Admission: RE | Admit: 2019-01-15 | Discharge: 2019-01-15 | Disposition: A | Discharge status: Home / Self Care | Payer: Medicare Other | Source: Ambulatory Visit | Attending: Internal Medicine | Admitting: Internal Medicine

## 2019-01-15 ENCOUNTER — Telehealth: Payer: Self-pay | Admitting: Internal Medicine

## 2019-01-15 DIAGNOSIS — Z01812 Encounter for preprocedural laboratory examination: Secondary | ICD-10-CM | POA: Diagnosis not present

## 2019-01-15 DIAGNOSIS — Z20828 Contact with and (suspected) exposure to other viral communicable diseases: Secondary | ICD-10-CM | POA: Insufficient documentation

## 2019-01-15 LAB — SARS CORONAVIRUS 2 (TAT 6-24 HRS): SARS Coronavirus 2: NEGATIVE

## 2019-01-15 NOTE — Telephone Encounter (Signed)
Called pt back and reviewed her prep instructions with her.  All questions were answered and pt voiced understanding.

## 2019-01-15 NOTE — Telephone Encounter (Signed)
Pt called asking for AS. I told her that AS was at lunch and I would let her know that she had called. [phone]

## 2019-01-17 ENCOUNTER — Encounter (HOSPITAL_COMMUNITY)
Admission: RE | Disposition: A | Discharge status: Home / Self Care | Payer: Self-pay | Source: Home / Self Care | Attending: Internal Medicine

## 2019-01-17 ENCOUNTER — Other Ambulatory Visit: Payer: Self-pay

## 2019-01-17 ENCOUNTER — Encounter (HOSPITAL_COMMUNITY): Payer: Self-pay | Admitting: *Deleted

## 2019-01-17 ENCOUNTER — Ambulatory Visit (HOSPITAL_COMMUNITY)
Admission: RE | Admit: 2019-01-17 | Discharge: 2019-01-17 | Disposition: A | Discharge status: Home / Self Care | Payer: Medicare Other | Attending: Internal Medicine | Admitting: Internal Medicine

## 2019-01-17 DIAGNOSIS — Z89022 Acquired absence of left finger(s): Secondary | ICD-10-CM | POA: Diagnosis not present

## 2019-01-17 DIAGNOSIS — M329 Systemic lupus erythematosus, unspecified: Secondary | ICD-10-CM | POA: Insufficient documentation

## 2019-01-17 DIAGNOSIS — M1990 Unspecified osteoarthritis, unspecified site: Secondary | ICD-10-CM | POA: Diagnosis not present

## 2019-01-17 DIAGNOSIS — F17210 Nicotine dependence, cigarettes, uncomplicated: Secondary | ICD-10-CM | POA: Diagnosis not present

## 2019-01-17 DIAGNOSIS — K5730 Diverticulosis of large intestine without perforation or abscess without bleeding: Secondary | ICD-10-CM | POA: Insufficient documentation

## 2019-01-17 DIAGNOSIS — Z7952 Long term (current) use of systemic steroids: Secondary | ICD-10-CM | POA: Diagnosis not present

## 2019-01-17 DIAGNOSIS — Z1211 Encounter for screening for malignant neoplasm of colon: Secondary | ICD-10-CM | POA: Diagnosis not present

## 2019-01-17 DIAGNOSIS — Z800 Family history of malignant neoplasm of digestive organs: Secondary | ICD-10-CM | POA: Insufficient documentation

## 2019-01-17 DIAGNOSIS — I10 Essential (primary) hypertension: Secondary | ICD-10-CM | POA: Insufficient documentation

## 2019-01-17 DIAGNOSIS — Z79899 Other long term (current) drug therapy: Secondary | ICD-10-CM | POA: Diagnosis not present

## 2019-01-17 DIAGNOSIS — E7800 Pure hypercholesterolemia, unspecified: Secondary | ICD-10-CM | POA: Diagnosis not present

## 2019-01-17 DIAGNOSIS — K635 Polyp of colon: Secondary | ICD-10-CM | POA: Diagnosis not present

## 2019-01-17 DIAGNOSIS — K6389 Other specified diseases of intestine: Secondary | ICD-10-CM | POA: Diagnosis not present

## 2019-01-17 DIAGNOSIS — Z791 Long term (current) use of non-steroidal anti-inflammatories (NSAID): Secondary | ICD-10-CM | POA: Insufficient documentation

## 2019-01-17 DIAGNOSIS — Z7982 Long term (current) use of aspirin: Secondary | ICD-10-CM | POA: Insufficient documentation

## 2019-01-17 HISTORY — PX: COLONOSCOPY: SHX5424

## 2019-01-17 HISTORY — DX: Pure hypercholesterolemia, unspecified: E78.00

## 2019-01-17 HISTORY — PX: POLYPECTOMY: SHX5525

## 2019-01-17 SURGERY — COLONOSCOPY
Anesthesia: Moderate Sedation

## 2019-01-17 MED ORDER — MIDAZOLAM HCL 5 MG/5ML IJ SOLN
INTRAMUSCULAR | Status: AC
  Filled 2019-01-17: qty 10

## 2019-01-17 MED ORDER — SODIUM CHLORIDE 0.9 % IV SOLN
INTRAVENOUS | Status: DC
  Administered 2019-01-17: 10:00:00 via INTRAVENOUS

## 2019-01-17 MED ORDER — STERILE WATER FOR IRRIGATION IR SOLN
Status: DC | PRN
  Administered 2019-01-17: 1.5 mL

## 2019-01-17 MED ORDER — MEPERIDINE HCL 50 MG/ML IJ SOLN
INTRAMUSCULAR | Status: AC
  Filled 2019-01-17: qty 1

## 2019-01-17 MED ORDER — ONDANSETRON HCL 4 MG/2ML IJ SOLN
INTRAMUSCULAR | Status: DC | PRN
  Administered 2019-01-17: 4 mg via INTRAVENOUS

## 2019-01-17 MED ORDER — MIDAZOLAM HCL 5 MG/5ML IJ SOLN
INTRAMUSCULAR | Status: DC | PRN
  Administered 2019-01-17: 2 mg via INTRAVENOUS
  Administered 2019-01-17 (×2): 1 mg via INTRAVENOUS

## 2019-01-17 MED ORDER — ONDANSETRON HCL 4 MG/2ML IJ SOLN
INTRAMUSCULAR | Status: AC
  Filled 2019-01-17: qty 2

## 2019-01-17 MED ORDER — MEPERIDINE HCL 100 MG/ML IJ SOLN
INTRAMUSCULAR | Status: DC | PRN
  Administered 2019-01-17: 25 mg

## 2019-01-17 NOTE — H&P (Signed)
@LOGO @   Primary Care Physician:  Rosita Fire, MD Primary Gastroenterologist:  Dr. Gala Romney Pre-Procedure History & Physical: HPI:  Crystal Patterson is a 53 y.o. female is here for a screening colonoscopy.  First ever average screening examination.  No bowel symptoms.  No family history colon cancer.  Past Medical History:  Diagnosis Date  . Arthritis   . Elevated troponin 05/24/2016  . Finger amputation, no complication   . Hypercholesteremia   . Hypertension   . Lupus [facility])     Past Surgical History:  Procedure Laterality Date  . left finger amputations    . right hand surgery    . right knee arthroscopy    . RIGHT/LEFT HEART CATH AND CORONARY ANGIOGRAPHY N/A 05/25/2016   Procedure: Right/Left Heart Cath and Coronary Angiography;  Surgeon: Troy Sine, MD;  Location: [facility];  Service: Cardiovascular;  Laterality: N/A;    Prior to Admission medications   Medication Sig Start Date End Date Taking? Authorizing Provider  albuterol (PROVENTIL HFA;VENTOLIN HFA) 108 (90 Base) MCG/ACT inhaler Inhale 2 puffs into the lungs every 6 (six) hours as needed for wheezing or shortness of breath.   Yes [provider]  amLODipine (NORVASC) 10 MG tablet Take 1 tablet (10 mg total) by mouth daily. 05/27/16  Yes Barrett, Evelene Croon, PA-C  aspirin EC 81 MG EC tablet Take 1 tablet (81 mg total) by mouth daily. 05/27/16  Yes Barrett, Evelene Croon, PA-C  atenolol (TENORMIN) 25 MG tablet Take 1 tablet (25 mg total) by mouth daily. 05/27/16  Yes Barrett, Evelene Croon, PA-C  atorvastatin (LIPITOR) 40 MG tablet Take 1 tablet (40 mg total) by mouth daily at 6 PM. Patient taking differently: Take 40 mg by mouth daily.  05/26/16  Yes Barrett, Evelene Croon, PA-C  benazepril (LOTENSIN) 40 MG tablet Take 1 tablet (40 mg total) by mouth daily. 05/27/16  Yes Barrett, Evelene Croon, PA-C  furosemide (LASIX) 40 MG tablet Take 1 tablet (40 mg total) by mouth daily. 05/27/16  Yes Barrett, Evelene Croon, PA-C  gabapentin  (NEURONTIN) 300 MG capsule Take 300 mg by mouth 2 (two) times daily.    Yes [provider]  ibuprofen (ADVIL,MOTRIN) 800 MG tablet Take 800 mg by mouth 2 (two) times daily as needed for headache or moderate pain.    Yes [provider]  Ketotifen Fumarate (ITCHY EYE DROPS OP) Place 1 drop into both eyes daily as needed (itchy eyes).   Yes [provider]  omeprazole (PRILOSEC) 20 MG capsule Take 20 mg by mouth daily.   Yes [provider]  potassium chloride (K-DUR) 10 MEQ tablet Take 10 mEq by mouth daily.    Yes [provider]  spironolactone (ALDACTONE) 25 MG tablet Take 1 tablet (25 mg total) by mouth 2 (two) times daily. Patient taking differently: Take 25 mg by mouth daily.  05/26/16  Yes Barrett, Evelene Croon, PA-C  zolpidem (AMBIEN) 10 MG tablet Take 10 mg by mouth at bedtime.   Yes [provider]  allopurinol (ZYLOPRIM) 100 MG tablet Take 1 tablet (100 mg total) by mouth daily. 05/27/16   Barrett, Evelene Croon, PA-C  diclofenac sodium (VOLTAREN) 1 % GEL Apply 1 application topically 4 (four) times daily as needed (pain).    [provider]    Allergies as of 11/15/2018  . (No Known Allergies)    Family History  Problem Relation Age of Onset  . Pancreatic cancer Mother   . Colon cancer  Father     Social History   Socioeconomic History  . Marital status: Single    Spouse name: Not on file  . Number of children: Not on file  . Years of education: Not on file  . Highest education level: Not on file  Occupational History  . Occupation: unemployed  Social Needs  . Financial resource strain: Not on file  . Food insecurity    Worry: Not on file    Inability: Not on file  . Transportation needs    Medical: Not on file    Non-medical: Not on file  Tobacco Use  . Smoking status: Current Every Day Smoker    Packs/day: 0.25    Years: 15.00    Pack years: 3.75    Types: Cigarettes  . Smokeless tobacco: Never Used   Substance and Sexual Activity  . Alcohol use: Yes    Alcohol/week: 3.0 standard drinks    Types: 3 Cans of beer per week  . Drug use: Yes    Types: Marijuana    Comment: last time 01/16/2019  . Sexual activity: Not Currently  Lifestyle  . Physical activity    Days per week: Not on file    Minutes per session: Not on file  . Stress: Not on file  Relationships  . Social Musician on phone: Not on file    Gets together: Not on file    Attends religious service: Not on file    Active member of club or organization: Not on file    Attends meetings of clubs or organizations: Not on file    Relationship status: Not on file  . Intimate partner violence    Fear of current or ex partner: Not on file    Emotionally abused: Not on file    Physically abused: Not on file    Forced sexual activity: Not on file  Other Topics Concern  . Not on file  Social History Narrative   Patient has been living alone; however, plans to discharge home to sister, Janice's, house    Review of Systems: See HPI, otherwise negative ROS  Physical Exam: BP 98/64   Pulse 65   Temp 98.4 F (36.9 C) (Oral)   Resp 20   Ht 5\' 4"  (1.626 m)   Wt 61.7 kg   SpO2 100%   BMI 23.34 kg/m  General:   Alert,  Well-developed, well-nourished, pleasant and cooperative in NAD Lungs:  Clear throughout to auscultation.   No wheezes, crackles, or rhonchi. No acute distress. Heart:  Regular rate and rhythm; no murmurs, clicks, rubs,  or gallops. Abdomen:  Soft, nontender and nondistended. No masses, hepatosplenomegaly or hernias noted. Normal bowel sounds, without guarding, and without rebound.   Impression/Plan: Crystal Patterson is now here to undergo a screening colonoscopy.  First ever average rescreening examination.  Risks, benefits, limitations, imponderables and alternatives regarding colonoscopy have been reviewed with the patient. Questions have been answered. All parties agreeable.     Notice:  This  dictation was prepared with Dragon dictation along with smaller phrase technology. Any transcriptional errors that result from this process are unintentional and may not be corrected upon review.

## 2019-01-17 NOTE — Discharge Instructions (Signed)
Colonoscopy Discharge Instructions  Read the instructions outlined below and refer to this sheet in the next few weeks. These discharge instructions provide you with general information on caring for yourself after you leave the hospital. Your doctor may also give you specific instructions. While your treatment has been planned according to the most current medical practices available, unavoidable complications occasionally occur. If you have any problems or questions after discharge, call Dr. Jena Gauss at [phone]. ACTIVITY  You may resume your regular activity, but move at a slower pace for the next 24 hours.   Take frequent rest periods for the next 24 hours.   Walking will help get rid of the air and reduce the bloated feeling in your belly (abdomen).   No driving for 24 hours (because of the medicine (anesthesia) used during the test).    Do not sign any important legal documents or operate any machinery for 24 hours (because of the anesthesia used during the test).  NUTRITION  Drink plenty of fluids.   You may resume your normal diet as instructed by your doctor.   Begin with a light meal and progress to your normal diet. Heavy or fried foods are harder to digest and may make you feel sick to your stomach (nauseated).   Avoid alcoholic beverages for 24 hours or as instructed.  MEDICATIONS  You may resume your normal medications unless your doctor tells you otherwise.  WHAT YOU CAN EXPECT TODAY  Some feelings of bloating in the abdomen.   Passage of more gas than usual.   Spotting of blood in your stool or on the toilet paper.  IF YOU HAD POLYPS REMOVED DURING THE COLONOSCOPY:  No aspirin products for 7 days or as instructed.   No alcohol for 7 days or as instructed.   Eat a soft diet for the next 24 hours.  FINDING OUT THE RESULTS OF YOUR TEST Not all test results are available during your visit. If your test results are not back during the visit, make an appointment  with your caregiver to find out the results. Do not assume everything is normal if you have not heard from your caregiver or the medical facility. It is important for you to follow up on all of your test results.  SEEK IMMEDIATE MEDICAL ATTENTION IF:  You have more than a spotting of blood in your stool.   Your belly is swollen (abdominal distention).   You are nauseated or vomiting.   You have a temperature over 101.   You have abdominal pain or discomfort that is severe or gets worse throughout the day.    Diverticulosis and colon polyp information provided  Further recommendations to follow pending review of pathology report  At patient request, I called daughter at [phone] and reviewed findings and recommendations.      Diverticulosis  Diverticulosis is a condition that develops when small pouches (diverticula) form in the wall of the large intestine (colon). The colon is where water is absorbed and stool is formed. The pouches form when the inside layer of the colon pushes through weak spots in the outer layers of the colon. You may have a few pouches or many of them. What are the causes? The cause of this condition is not known. What increases the risk? The following factors may make you more likely to develop this condition:  Being older than age 49. Your risk for this condition increases with age. Diverticulosis is rare among people younger than age 47. By  age 52, many people have it.  Eating a low-fiber diet.  Having frequent constipation.  Being overweight.  Not getting enough exercise.  Smoking.  Taking over-the-counter pain medicines, like aspirin and ibuprofen.  Having a family history of diverticulosis. What are the signs or symptoms? In most people, there are no symptoms of this condition. If you do have symptoms, they may include:  Bloating.  Cramps in the abdomen.  Constipation or diarrhea.  Pain in the lower left side of the abdomen. How  is this diagnosed? This condition is most often diagnosed during an exam for other colon problems. Because diverticulosis usually has no symptoms, it often cannot be diagnosed independently. This condition may be diagnosed by:  Using a flexible scope to examine the colon (colonoscopy).  Taking an X-ray of the colon after dye has been put into the colon (barium enema).  Doing a CT scan. How is this treated? You may not need treatment for this condition if you have never developed an infection related to diverticulosis. If you have had an infection before, treatment may include:  Eating a high-fiber diet. This may include eating more fruits, vegetables, and grains.  Taking a fiber supplement.  Taking a live bacteria supplement (probiotic).  Taking medicine to relax your colon.  Taking antibiotic medicines. Follow these instructions at home:  Drink 6-8 glasses of water or more each day to prevent constipation.  Try not to strain when you have a bowel movement.  If you have had an infection before: ? Eat more fiber as directed by your health care provider or your diet and nutrition specialist (dietitian). ? Take a fiber supplement or probiotic, if your health care provider approves.  Take over-the-counter and prescription medicines only as told by your health care provider.  If you were prescribed an antibiotic, take it as told by your health care provider. Do not stop taking the antibiotic even if you start to feel better.  Keep all follow-up visits as told by your health care provider. This is important. Contact a health care provider if:  You have pain in your abdomen.  You have bloating.  You have cramps.  You have not had a bowel movement in 3 days. Get help right away if:  Your pain gets worse.  Your bloating becomes very bad.  You have a fever or chills, and your symptoms suddenly get worse.  You vomit.  You have bowel movements that are bloody or  black.  You have bleeding from your rectum. Summary  Diverticulosis is a condition that develops when small pouches (diverticula) form in the wall of the large intestine (colon).  You may have a few pouches or many of them.  This condition is most often diagnosed during an exam for other colon problems.  If you have had an infection related to diverticulosis, treatment may include increasing the fiber in your diet, taking supplements, or taking medicines. This information is not intended to replace advice given to you by your health care provider. Make sure you discuss any questions you have with your health care provider. Document Released: 11/13/2003 Document Revised: 01/28/2017 Document Reviewed: 01/05/2016 Elsevier Patient Education  2020 [employer]    Colon Polyps  Polyps are tissue growths inside the body. Polyps can grow in many places, including the large intestine (colon). A polyp may be a round bump or a mushroom-shaped growth. You could have one polyp or several. Most colon polyps are noncancerous (benign). However, some colon polyps can become  cancerous over time. Finding and removing the polyps early can help prevent this. What are the causes? The exact cause of colon polyps is not known. What increases the risk? You are more likely to develop this condition if you:  Have a family history of colon cancer or colon polyps.  Are older than 51 or older than 45 if you are African American.  Have inflammatory bowel disease, such as ulcerative colitis or Crohn's disease.  Have certain hereditary conditions, such as: ? Familial adenomatous polyposis. ? Lynch syndrome. ? Turcot syndrome. ? Peutz-Jeghers syndrome.  Are overweight.  Smoke cigarettes.  Do not get enough exercise.  Drink too much alcohol.  Eat a diet that is high in fat and red meat and low in fiber.  Had childhood cancer that was treated with abdominal radiation. What are the signs or  symptoms? Most polyps do not cause symptoms. If you have symptoms, they may include:  Blood coming from your rectum when having a bowel movement.  Blood in your stool. The stool may look dark red or black.  Abdominal pain.  A change in bowel habits, such as constipation or diarrhea. How is this diagnosed? This condition is diagnosed with a colonoscopy. This is a procedure in which a lighted, flexible scope is inserted into the anus and then passed into the colon to examine the area. Polyps are sometimes found when a colonoscopy is done as part of routine cancer screening tests. How is this treated? Treatment for this condition involves removing any polyps that are found. Most polyps can be removed during a colonoscopy. Those polyps will then be tested for cancer. Additional treatment may be needed depending on the results of testing. Follow these instructions at home: Lifestyle  Maintain a healthy weight, or lose weight if recommended by your health care provider.  Exercise every day or as told by your health care provider.  Do not use any products that contain nicotine or tobacco, such as cigarettes and e-cigarettes. If you need help quitting, ask your health care provider.  If you drink alcohol, limit how much you have: ? 0-1 drink a day for women. ? 0-2 drinks a day for men.  Be aware of how much alcohol is in your drink. In the [place] one drink equals one 12 oz bottle of beer (355 mL), one 5 oz glass of wine (148 mL), or one 1 oz shot of hard liquor (44 mL). Eating and drinking   Eat foods that are high in fiber, such as fruits, vegetables, and whole grains.  Eat foods that are high in calcium and vitamin D, such as milk, cheese, yogurt, eggs, liver, fish, and broccoli.  Limit foods that are high in fat, such as fried foods and desserts.  Limit the amount of red meat and processed meat you eat, such as hot dogs, sausage, bacon, and lunch meats. General instructions  Keep  all follow-up visits as told by your health care provider. This is important. ? This includes having regularly scheduled colonoscopies. ? Talk to your health care provider about when you need a colonoscopy. Contact a health care provider if:  You have new or worsening bleeding during a bowel movement.  You have new or increased blood in your stool.  You have a change in bowel habits.  You lose weight for no known reason. Summary  Polyps are tissue growths inside the body. Polyps can grow in many places, including the colon.  Most colon polyps are noncancerous (benign), but  some can become cancerous over time.  This condition is diagnosed with a colonoscopy.  Treatment for this condition involves removing any polyps that are found. Most polyps can be removed during a colonoscopy. This information is not intended to replace advice given to you by your health care provider. Make sure you discuss any questions you have with your health care provider. Document Released: 11/12/2003 Document Revised: 06/02/2017 Document Reviewed: 06/02/2017 Elsevier Patient Education  2020 [employer].

## 2019-01-17 NOTE — Op Note (Signed)
[facility] Patient Name: Crystal KindredJudy Patterson Procedure Date: 01/17/2019 9:55 AM MRN: [id number] Date of Birth: 04-Aug-1965 Attending MD: Gennette Pacobert Michael Samariah Hokenson , MD CSN: [id number] Age: [age] Admit Type: Outpatient Procedure:                Colonoscopy Indications:              Screening for colorectal malignant neoplasm Providers:                Gennette Pacobert Michael Prabhjot Maddux, MD, Sterling Bigiffani Roberts, RN,                            Edythe ClarityKelly Cox, Technician Referring MD:              Medicines:                Meperidine 25 mg IV, Midazolam 4 mg IV Complications:            No immediate complications. Estimated Blood Loss:     Estimated blood loss was minimal. Procedure:                After obtaining informed consent, the colonoscope                            was passed under direct vision. Throughout the                            procedure, the patient's blood pressure, pulse, and                            oxygen saturations were monitored continuously. The                            CF-[id number] ([id number] scope was introduced through                            the anus and advanced to the blunt end of the                            upstream colon. Scope In: 10:09:13 AM Scope Out: 10:27:42 AM Scope Withdrawal Time: 0 hours 7 minutes 43 seconds  Total Procedure Duration: 0 hours 18 minutes 29 seconds  Findings:      The perianal and digital rectal examinations were normal.      Multiple small and large-mouthed diverticula were found in the entire       colon.      A 3 mm polyp was found in the base of the blunt end of the upstream       colon. I was unable to identify typical landmarks seen with the cecum.       No crows foot. No distinct appendiceal orifice no ileocecal valve upon       close inspection. Prior history of GI surgery. Please see multiple       photographs taken. No other mucosal abnormalities were observed. Rectal       vault small. Too small to retroflex. Rectal mucosa seen well  on?"face. Impression:               - Diverticulosis in the entire examined colon.  Abberant ascending colon/cecal anatomy. Unable to                            identify an ileocecal valve/cecum/appendiceal                            orifice satisfactorily.                           - One polyp. Removed with cold biopsy                           - Moderate Sedation:      Moderate (conscious) sedation was administered by the endoscopy nurse       and supervised by the endoscopist. The following parameters were       monitored: oxygen saturation, heart rate, blood pressure, respiratory       rate, EKG, adequacy of pulmonary ventilation, and response to care.       Total physician intraservice time was 22 minutes. Recommendation:           - Patient has a contact number available for                            emergencies. The signs and symptoms of potential                            delayed complications were discussed with the                            patient. Return to normal activities tomorrow.                            Written discharge instructions were provided to the                            patient.                           - Advance diet as tolerated.                           - Continue present medications.                           - Repeat colonoscopy date to be determined after                            pending pathology results are reviewed for                            surveillance.                           - Return to GI clinic (date not yet determined).                            Anticipate  further imaging of ascending colon via                            an air-contrast barium enema in the near future (at                            least 4 weeks from now) given bx's today Procedure Code(s):        --- Professional ---                           [phone], Colonoscopy, flexible; diagnostic, including                            collection  of specimen(s) by brushing or washing,                            when performed (separate procedure)                           G0500, Moderate sedation services provided by the                            same physician or other qualified health care                            professional performing a gastrointestinal                            endoscopic service that sedation supports,                            requiring the presence of an independent trained                            observer to assist in the monitoring of the                            patient's level of consciousness and physiological                            status; initial 15 minutes of intra-service time;                            patient age 52 years or older (additional time may                            be reported with [id number], as appropriate) Diagnosis Code(s):        --- Professional ---                           Z12.11, Encounter for screening for malignant                            neoplasm of colon  K63.5, Polyp of colon                           K57.30, Diverticulosis of large intestine without                            perforation or abscess without bleeding CPT copyright 2019 [employer]. All rights reserved. The codes documented in this report are preliminary and upon coder review may  be revised to meet current compliance requirements. [name]. Garlan Drewes, MD Norvel Richards, MD 01/17/2019 10:51:29 AM This report has been signed electronically. Number of Addenda: 0

## 2019-01-18 LAB — SURGICAL PATHOLOGY

## 2019-01-19 ENCOUNTER — Encounter (HOSPITAL_COMMUNITY): Payer: Self-pay | Admitting: Internal Medicine

## 2019-01-22 ENCOUNTER — Other Ambulatory Visit: Payer: Self-pay

## 2019-01-22 DIAGNOSIS — R933 Abnormal findings on diagnostic imaging of other parts of digestive tract: Secondary | ICD-10-CM

## 2019-01-22 NOTE — Progress Notes (Unsigned)
a 

## 2019-02-01 DIAGNOSIS — Z89029 Acquired absence of unspecified finger(s): Secondary | ICD-10-CM | POA: Diagnosis not present

## 2019-02-01 DIAGNOSIS — Z23 Encounter for immunization: Secondary | ICD-10-CM | POA: Diagnosis not present

## 2019-02-01 DIAGNOSIS — I10 Essential (primary) hypertension: Secondary | ICD-10-CM | POA: Diagnosis not present

## 2019-02-01 DIAGNOSIS — M329 Systemic lupus erythematosus, unspecified: Secondary | ICD-10-CM | POA: Diagnosis not present

## 2019-02-01 DIAGNOSIS — I739 Peripheral vascular disease, unspecified: Secondary | ICD-10-CM | POA: Diagnosis not present

## 2019-02-15 ENCOUNTER — Other Ambulatory Visit: Payer: Self-pay

## 2019-02-15 ENCOUNTER — Ambulatory Visit (HOSPITAL_COMMUNITY)
Admission: RE | Admit: 2019-02-15 | Discharge: 2019-02-15 | Disposition: A | Discharge status: Home / Self Care | Payer: Medicare Other | Source: Ambulatory Visit | Attending: Internal Medicine | Admitting: Internal Medicine

## 2019-02-15 DIAGNOSIS — R933 Abnormal findings on diagnostic imaging of other parts of digestive tract: Secondary | ICD-10-CM | POA: Diagnosis not present

## 2019-02-15 DIAGNOSIS — K5730 Diverticulosis of large intestine without perforation or abscess without bleeding: Secondary | ICD-10-CM | POA: Diagnosis not present

## 2019-02-19 ENCOUNTER — Encounter: Payer: Self-pay | Admitting: Internal Medicine

## 2019-02-19 DIAGNOSIS — H52223 Regular astigmatism, bilateral: Secondary | ICD-10-CM | POA: Diagnosis not present

## 2019-02-19 DIAGNOSIS — H524 Presbyopia: Secondary | ICD-10-CM | POA: Diagnosis not present

## 2019-03-04 DIAGNOSIS — M329 Systemic lupus erythematosus, unspecified: Secondary | ICD-10-CM | POA: Diagnosis not present

## 2019-03-04 DIAGNOSIS — I10 Essential (primary) hypertension: Secondary | ICD-10-CM | POA: Diagnosis not present

## 2019-03-19 IMAGING — DX DG FINGER INDEX 2+V*R*
3 series · 3 of 3 positions shown · non-contrast
Comparison: 10/24/2016

CLINICAL DATA: Pain in the right index finger for 2-3 weeks. Fell
out of the wheelchair.

EXAM:
RIGHT INDEX FINGER 2+V

[finger ap]
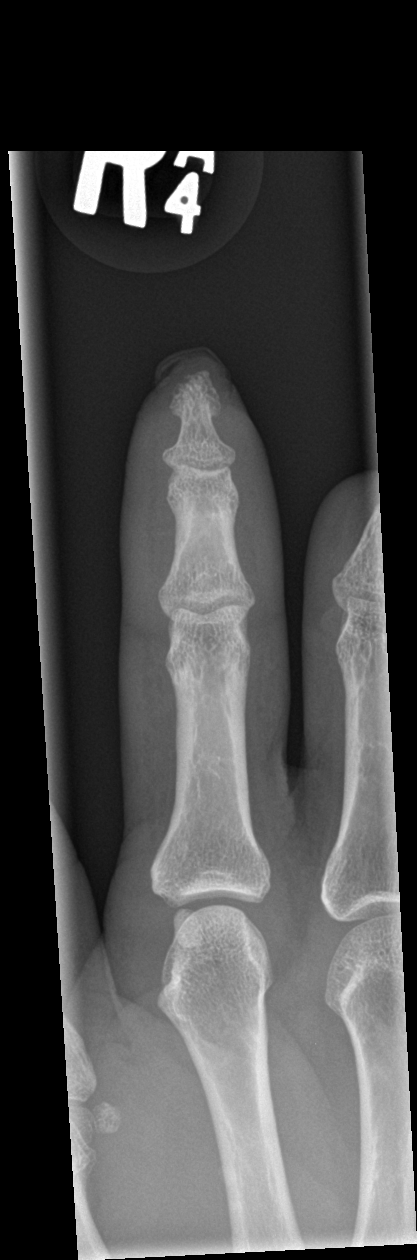

[finger obl]
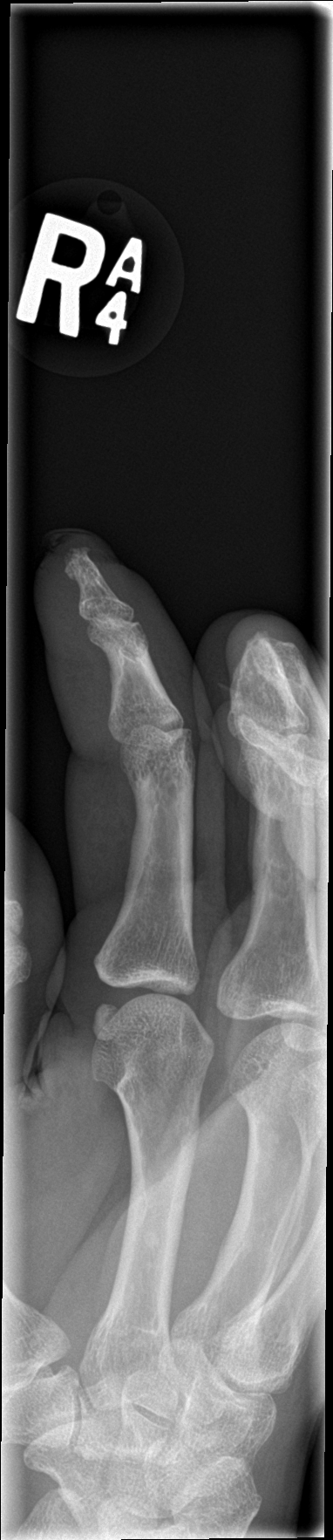

[finger lat]
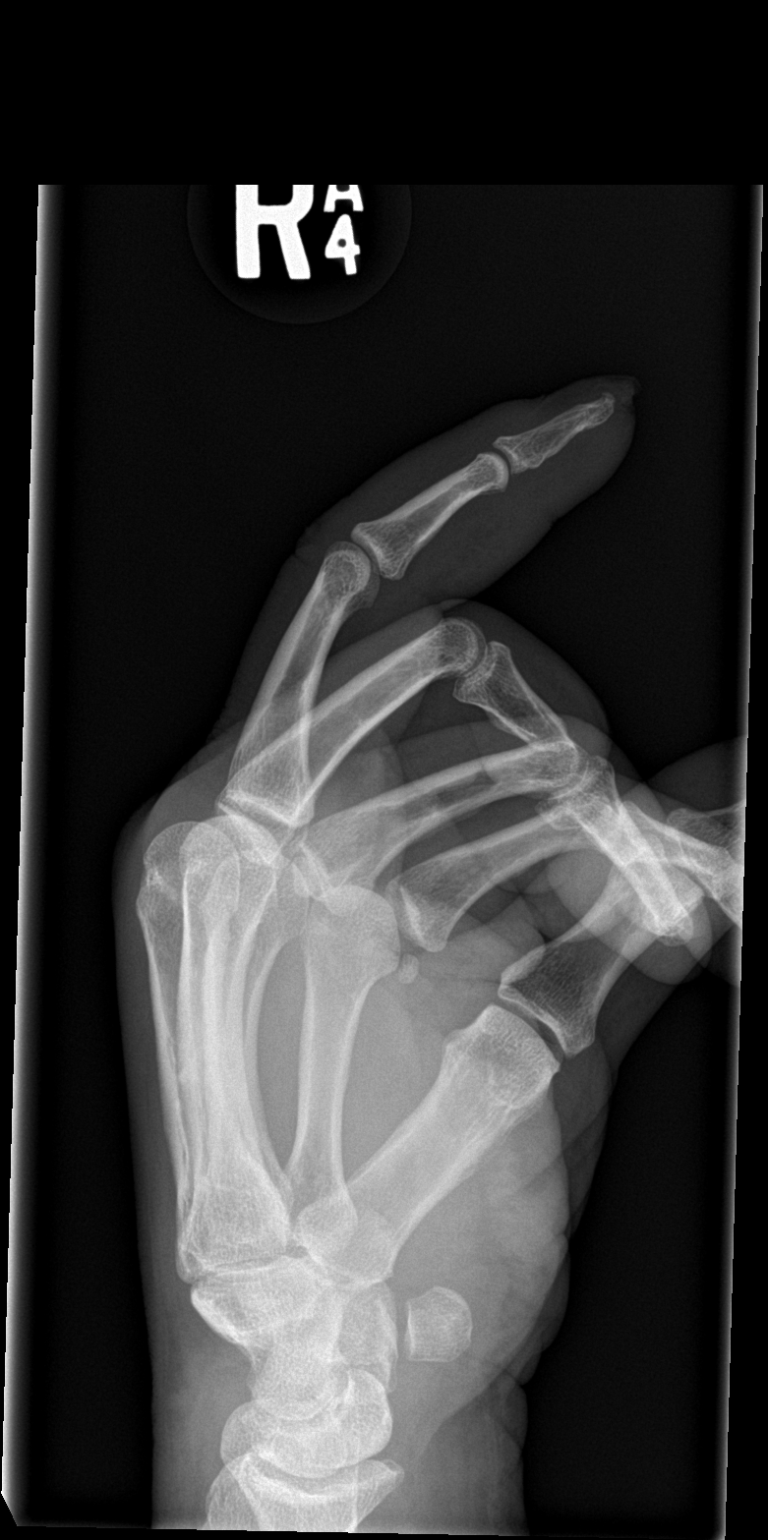

[3 of 3 positions shown; findings below may reference images not displayed]

FINDINGS: There is no acute fracture or subluxation. There is irregularity of
the tuft, stable in appearance. Thinning of the soft tissues of the
distal aspect of the digit, combined with soft tissue swelling of
the proximal digit. Amputated distal phalanx of the third digit.
IMPRESSION: 1. Stable appearance of the index finger.
2. No acute fracture.

## 2019-03-20 DIAGNOSIS — M329 Systemic lupus erythematosus, unspecified: Secondary | ICD-10-CM | POA: Diagnosis not present

## 2019-03-20 DIAGNOSIS — Z89029 Acquired absence of unspecified finger(s): Secondary | ICD-10-CM | POA: Diagnosis not present

## 2019-03-20 DIAGNOSIS — I7300 Raynaud's syndrome without gangrene: Secondary | ICD-10-CM | POA: Diagnosis not present

## 2019-03-20 DIAGNOSIS — Z9181 History of falling: Secondary | ICD-10-CM | POA: Diagnosis not present

## 2019-03-20 DIAGNOSIS — I10 Essential (primary) hypertension: Secondary | ICD-10-CM | POA: Diagnosis not present

## 2019-03-23 DIAGNOSIS — Z89029 Acquired absence of unspecified finger(s): Secondary | ICD-10-CM | POA: Diagnosis not present

## 2019-03-23 DIAGNOSIS — I10 Essential (primary) hypertension: Secondary | ICD-10-CM | POA: Diagnosis not present

## 2019-03-23 DIAGNOSIS — I7300 Raynaud's syndrome without gangrene: Secondary | ICD-10-CM | POA: Diagnosis not present

## 2019-03-23 DIAGNOSIS — Z9181 History of falling: Secondary | ICD-10-CM | POA: Diagnosis not present

## 2019-03-23 DIAGNOSIS — M329 Systemic lupus erythematosus, unspecified: Secondary | ICD-10-CM | POA: Diagnosis not present

## 2019-03-27 DIAGNOSIS — Z89029 Acquired absence of unspecified finger(s): Secondary | ICD-10-CM | POA: Diagnosis not present

## 2019-03-27 DIAGNOSIS — M329 Systemic lupus erythematosus, unspecified: Secondary | ICD-10-CM | POA: Diagnosis not present

## 2019-03-27 DIAGNOSIS — I10 Essential (primary) hypertension: Secondary | ICD-10-CM | POA: Diagnosis not present

## 2019-03-27 DIAGNOSIS — Z9181 History of falling: Secondary | ICD-10-CM | POA: Diagnosis not present

## 2019-03-27 DIAGNOSIS — I7300 Raynaud's syndrome without gangrene: Secondary | ICD-10-CM | POA: Diagnosis not present

## 2019-03-29 DIAGNOSIS — Z89029 Acquired absence of unspecified finger(s): Secondary | ICD-10-CM | POA: Diagnosis not present

## 2019-03-29 DIAGNOSIS — M329 Systemic lupus erythematosus, unspecified: Secondary | ICD-10-CM | POA: Diagnosis not present

## 2019-03-29 DIAGNOSIS — I10 Essential (primary) hypertension: Secondary | ICD-10-CM | POA: Diagnosis not present

## 2019-03-29 DIAGNOSIS — I7300 Raynaud's syndrome without gangrene: Secondary | ICD-10-CM | POA: Diagnosis not present

## 2019-03-29 DIAGNOSIS — Z9181 History of falling: Secondary | ICD-10-CM | POA: Diagnosis not present

## 2019-04-04 DIAGNOSIS — I10 Essential (primary) hypertension: Secondary | ICD-10-CM | POA: Diagnosis not present

## 2019-04-04 DIAGNOSIS — M329 Systemic lupus erythematosus, unspecified: Secondary | ICD-10-CM | POA: Diagnosis not present

## 2019-05-21 DIAGNOSIS — Z0001 Encounter for general adult medical examination with abnormal findings: Secondary | ICD-10-CM | POA: Diagnosis not present

## 2019-05-21 DIAGNOSIS — Z1389 Encounter for screening for other disorder: Secondary | ICD-10-CM | POA: Diagnosis not present

## 2019-05-21 DIAGNOSIS — M329 Systemic lupus erythematosus, unspecified: Secondary | ICD-10-CM | POA: Diagnosis not present

## 2019-05-21 DIAGNOSIS — F17210 Nicotine dependence, cigarettes, uncomplicated: Secondary | ICD-10-CM | POA: Diagnosis not present

## 2019-05-21 DIAGNOSIS — Z89029 Acquired absence of unspecified finger(s): Secondary | ICD-10-CM | POA: Diagnosis not present

## 2019-05-21 DIAGNOSIS — I7300 Raynaud's syndrome without gangrene: Secondary | ICD-10-CM | POA: Diagnosis not present

## 2019-05-28 DIAGNOSIS — I10 Essential (primary) hypertension: Secondary | ICD-10-CM | POA: Diagnosis not present

## 2019-05-28 DIAGNOSIS — M329 Systemic lupus erythematosus, unspecified: Secondary | ICD-10-CM | POA: Diagnosis not present

## 2019-05-28 DIAGNOSIS — I7300 Raynaud's syndrome without gangrene: Secondary | ICD-10-CM | POA: Diagnosis not present

## 2019-06-21 DIAGNOSIS — M329 Systemic lupus erythematosus, unspecified: Secondary | ICD-10-CM | POA: Diagnosis not present

## 2019-06-21 DIAGNOSIS — I10 Essential (primary) hypertension: Secondary | ICD-10-CM | POA: Diagnosis not present

## 2019-07-21 DIAGNOSIS — I10 Essential (primary) hypertension: Secondary | ICD-10-CM | POA: Diagnosis not present

## 2019-07-21 DIAGNOSIS — M329 Systemic lupus erythematosus, unspecified: Secondary | ICD-10-CM | POA: Diagnosis not present

## 2019-08-16 DIAGNOSIS — M25561 Pain in right knee: Secondary | ICD-10-CM | POA: Diagnosis not present

## 2019-08-16 DIAGNOSIS — L089 Local infection of the skin and subcutaneous tissue, unspecified: Secondary | ICD-10-CM | POA: Diagnosis not present

## 2019-08-16 DIAGNOSIS — I7300 Raynaud's syndrome without gangrene: Secondary | ICD-10-CM | POA: Diagnosis not present

## 2019-08-16 DIAGNOSIS — I10 Essential (primary) hypertension: Secondary | ICD-10-CM | POA: Diagnosis not present

## 2019-08-17 DIAGNOSIS — M1711 Unilateral primary osteoarthritis, right knee: Secondary | ICD-10-CM | POA: Diagnosis not present

## 2019-08-17 DIAGNOSIS — M1712 Unilateral primary osteoarthritis, left knee: Secondary | ICD-10-CM | POA: Diagnosis not present

## 2019-08-17 DIAGNOSIS — M170 Bilateral primary osteoarthritis of knee: Secondary | ICD-10-CM | POA: Diagnosis not present

## 2019-08-29 ENCOUNTER — Other Ambulatory Visit: Payer: Self-pay

## 2019-08-29 ENCOUNTER — Ambulatory Visit: Payer: Medicare Other | Admitting: Orthopaedic Surgery

## 2019-09-04 DIAGNOSIS — I739 Peripheral vascular disease, unspecified: Secondary | ICD-10-CM | POA: Diagnosis not present

## 2019-09-04 DIAGNOSIS — Z89029 Acquired absence of unspecified finger(s): Secondary | ICD-10-CM | POA: Diagnosis not present

## 2019-09-04 DIAGNOSIS — M21961 Unspecified acquired deformity of right lower leg: Secondary | ICD-10-CM | POA: Diagnosis not present

## 2019-09-12 ENCOUNTER — Ambulatory Visit: Payer: Self-pay

## 2019-09-12 ENCOUNTER — Ambulatory Visit (INDEPENDENT_AMBULATORY_CARE_PROVIDER_SITE_OTHER): Payer: Medicare Other | Admitting: Orthopaedic Surgery

## 2019-09-12 ENCOUNTER — Encounter: Payer: Self-pay | Admitting: Orthopaedic Surgery

## 2019-09-12 ENCOUNTER — Other Ambulatory Visit: Payer: Self-pay

## 2019-09-12 VITALS — Ht 64.0 in | Wt 140.0 lb

## 2019-09-12 DIAGNOSIS — G8929 Other chronic pain: Secondary | ICD-10-CM

## 2019-09-12 DIAGNOSIS — M25561 Pain in right knee: Secondary | ICD-10-CM

## 2019-09-12 DIAGNOSIS — M329 Systemic lupus erythematosus, unspecified: Secondary | ICD-10-CM

## 2019-09-12 DIAGNOSIS — M25562 Pain in left knee: Secondary | ICD-10-CM

## 2019-09-12 DIAGNOSIS — M170 Bilateral primary osteoarthritis of knee: Secondary | ICD-10-CM

## 2019-09-12 NOTE — Progress Notes (Signed)
Office Visit Note   Patient: Crystal Patterson           Date of Birth: [birth year]           MRN: [id number] Visit Date: 09/12/2019              Requested by: Avon Gully, MD [address] [place],  [place] [address] PCP: Avon Gully, MD   Assessment & Plan: Visit Diagnoses:  1. Chronic pain of both knees   2. Systemic lupus erythematosus arthritis (HCC)   3. Arthritis of both knees     Plan:  #1: At this time we are going to place her into a left knee sleeve. #2: We will put a consult in for Dr. Magnus Ivan to see if he would be interested in total joint replacements for this patient.  Follow-Up Instructions: No follow-ups on file.   Orders:  Orders Placed This Encounter  Procedures  . XR KNEE 3 VIEW LEFT  . XR KNEE 3 VIEW RIGHT  . XR Pelvis 1-2 Views  . Ambulatory referral to Orthopedic Surgery   No orders of the defined types were placed in this encounter.     Procedures: No procedures performed   Clinical Data: No additional findings.   Subjective: Chief Complaint  Patient presents with  . Left Knee - Pain  . Right Knee - Pain   HPI: Patient presents today for bilateral knee pain. She said that they have hurt for two years. The left side is worse than the right. She cannot bear weight for extended periods. Her pain is all throughout her knees and calf muscles. She walks with the assistance of a cane. She said that they swell, pop, and give way. No previous knee surgery. Her previous treatment. She said they are worsening. She has difficulty sleeping some nights. The pain is worse when weightbearing.    Review of Systems   Objective: Vital Signs: Ht 5\' 4"  (1.626 m)   Wt 140 lb (63.5 kg)   BMI 24.03 kg/m   Physical Exam Constitutional:      Appearance: Normal appearance. She is well-developed.  HENT:     Head: Normocephalic.  Eyes:     Pupils: Pupils are equal, round, and reactive to light.  Pulmonary:     Effort: Pulmonary effort is  normal.  Skin:    General: Skin is warm and dry.  Neurological:     Mental Status: She is alert and oriented to person, place, and time.  Psychiatric:        Behavior: Behavior normal.     Ortho Exam  Exam today reveals some marked deformities of both knees.  Almost a windswept effect but she says stands with the right knee and valgus and her left knee in varus.  In regards to her right knee she is bone-on-bone with scalloping the lateral tibial plateau.  This appears to be some loss of the lateral femoral condyle also.  The left is more medial loss of compartment.  She does have translation medially of the femur laterally of the tibia.  Calcification is noted above femoral arteries and popliteal arteries also.   Specialty Comments:  No specialty comments available.  Imaging: No results found.   Knee X-Ray  Exam today reveals marked deformities of both knees.  Almost a windswept effect but she says stands with the right knee in valgus and her left knee in varus.  In regards to her right knee she is bone-on-bone with scalloping  the lateral tibial plateau.  This appears to be some loss of the lateral femoral condyle also.  The left is more medial loss of compartment.  She does have translation medially of the femur laterally of the tibia.  Calcification is noted above femoral arteries and popliteal arteries also.  Pelvis x-ray Reveals maintenance of position of the hips.  There is some question of whether there is an area of avascular necrosis of the femoral head on the right.  Calcification in the iliac and femoral arteries again noted.  There is a calcification at the lateral femoral neck of the left hip.    Current Outpatient Medications  Medication Sig Dispense Refill  . albuterol (PROVENTIL HFA;VENTOLIN HFA) 108 (90 Base) MCG/ACT inhaler Inhale 2 puffs into the lungs every 6 (six) hours as needed for wheezing or shortness of breath.    . allopurinol (ZYLOPRIM) 100 MG tablet Take 1  tablet (100 mg total) by mouth daily. 30 tablet 3  . amLODipine (NORVASC) 10 MG tablet Take 1 tablet (10 mg total) by mouth daily. 30 tablet 11  . aspirin EC 81 MG EC tablet Take 1 tablet (81 mg total) by mouth daily.    [name] atenolol (TENORMIN) 25 MG tablet Take 1 tablet (25 mg total) by mouth daily. 30 tablet 11  . atorvastatin (LIPITOR) 40 MG tablet Take 1 tablet (40 mg total) by mouth daily at 6 PM. (Patient taking differently: Take 40 mg by mouth daily. ) 30 tablet 11  . benazepril (LOTENSIN) 40 MG tablet Take 1 tablet (40 mg total) by mouth daily. 30 tablet 11  . diclofenac sodium (VOLTAREN) 1 % GEL Apply 1 application topically 4 (four) times daily as needed (pain).    . furosemide (LASIX) 40 MG tablet Take 1 tablet (40 mg total) by mouth daily. 30 tablet 11  . gabapentin (NEURONTIN) 300 MG capsule Take 300 mg by mouth 2 (two) times daily.     [name] ibuprofen (ADVIL,MOTRIN) 800 MG tablet Take 800 mg by mouth 2 (two) times daily as needed for headache or moderate pain.     [name] Ketotifen Fumarate (ITCHY EYE DROPS OP) Place 1 drop into both eyes daily as needed (itchy eyes).    [name] omeprazole (PRILOSEC) 20 MG capsule Take 20 mg by mouth daily.    . potassium chloride (K-DUR) 10 MEQ tablet Take 10 mEq by mouth daily.     [name] spironolactone (ALDACTONE) 25 MG tablet Take 1 tablet (25 mg total) by mouth 2 (two) times daily. (Patient taking differently: Take 25 mg by mouth daily. ) 30 tablet 11  . zolpidem (AMBIEN) 10 MG tablet Take 10 mg by mouth at bedtime.     No current facility-administered medications for this visit.    PMFS History:  Patient Active Problem List   Diagnosis Date Noted  . Systemic lupus erythematosus arthritis (HCC) 09/12/2019  . Arthritis of both knees 09/12/2019  . Syncope 12/06/2016  . ETOH abuse 12/06/2016  . PAD (peripheral artery disease) (HCC) 12/06/2016  . LVH (left ventricular hypertrophy) 05/26/2016  . Elevated troponin 05/24/2016  . HYPOKALEMIA 08/19/2008  . FATTY  LIVER DISEASE 05/09/2008  . WEIGHT LOSS 03/28/2008  . RAYNAUDS SYNDROME 02/15/2008  . CARDIAC MURMUR 11/16/2007  . Systemic lupus erythematosus (HCC) 05/25/2006  . GOUT 02/02/2006  . DEPRESSION 02/02/2006  . MIGRAINE HEADACHE 02/02/2006  . Essential hypertension 02/02/2006  . ALLERGIC RHINITIS 02/02/2006  . Asthma 02/02/2006  . GERD 02/02/2006  . IBS 02/02/2006  . OVERACTIVE  BLADDER 02/02/2006   Past Medical History:  Diagnosis Date  . Arthritis   . Elevated troponin 05/24/2016  . Finger amputation, no complication   . Hypercholesteremia   . Hypertension   . Lupus (HCC)     Family History  Problem Relation Age of Onset  . Pancreatic cancer Mother   . Colon cancer Father     Past Surgical History:  Procedure Laterality Date  . COLONOSCOPY N/A 01/17/2019   Procedure: COLONOSCOPY;  Surgeon: Corbin Ade, MD;  [facility];  Service: Endoscopy;  Laterality: N/A;  10:30  . left finger amputations    . POLYPECTOMY  01/17/2019   Procedure: POLYPECTOMY;  Surgeon: Corbin Ade, MD;  Location: [facility];  Service: Endoscopy;;  . right hand surgery    . right knee arthroscopy    . RIGHT/LEFT HEART CATH AND CORONARY ANGIOGRAPHY N/A 05/25/2016   Procedure: Right/Left Heart Cath and Coronary Angiography;  Surgeon: Lennette Bihari, MD;  [facility];  Service: Cardiovascular;  Laterality: N/A;   Social History   Occupational History  . Occupation: unemployed  Tobacco Use  . Smoking status: Current Every Day Smoker    Packs/day: 0.25    Years: 15.00    Pack years: 3.75    Types: Cigarettes  . Smokeless tobacco: Never Used  Vaping Use  . Vaping Use: Never used  Substance and Sexual Activity  . Alcohol use: Yes    Alcohol/week: 3.0 standard drinks    Types: 3 Cans of beer per week  . Drug use: Yes    Types: Marijuana    Comment: last time 01/16/2019  . Sexual activity: Not Currently

## 2019-09-14 DIAGNOSIS — I10 Essential (primary) hypertension: Secondary | ICD-10-CM | POA: Diagnosis not present

## 2019-09-14 DIAGNOSIS — I739 Peripheral vascular disease, unspecified: Secondary | ICD-10-CM | POA: Diagnosis not present

## 2019-09-26 ENCOUNTER — Ambulatory Visit: Payer: Self-pay

## 2019-09-26 ENCOUNTER — Encounter: Payer: Self-pay | Admitting: Orthopaedic Surgery

## 2019-09-26 ENCOUNTER — Ambulatory Visit (INDEPENDENT_AMBULATORY_CARE_PROVIDER_SITE_OTHER): Payer: Medicare Other | Admitting: Orthopaedic Surgery

## 2019-09-26 VITALS — Ht 64.0 in | Wt 140.0 lb

## 2019-09-26 DIAGNOSIS — M1712 Unilateral primary osteoarthritis, left knee: Secondary | ICD-10-CM | POA: Diagnosis not present

## 2019-09-26 DIAGNOSIS — M1711 Unilateral primary osteoarthritis, right knee: Secondary | ICD-10-CM

## 2019-09-26 DIAGNOSIS — M170 Bilateral primary osteoarthritis of knee: Secondary | ICD-10-CM

## 2019-09-26 NOTE — Progress Notes (Signed)
Office Visit Note   Patient: Crystal Patterson           Date of Birth: 1965/12/11           MRN: [id number] Visit Date: 09/26/2019              Requested by: Valeria Batman, MD [address] [place],  [place] [address] PCP: Avon Gully, MD   Assessment & Plan: Visit Diagnoses:  1. Arthritis of both knees   2. Unilateral primary osteoarthritis, left knee   3. Unilateral primary osteoarthritis, right knee     Plan: I did go over the x-rays with the patient in detail.  I do feel that her knees are too complicated to perform bilateral knee replacement surgery on at the same setting.  Her left knee hurts her the worst.  I showed her knee replacement model and explained in detail what the surgery involves.  I discussed the difficulty with knee replacement surgery.  We talked about her interoperative and postoperative course and the risk and benefits of surgery in great detail.  All questions and concerns were answered and addressed.  We will work on getting her left knee scheduled for knee replacement surgery in the near future.  Follow-Up Instructions: Return for 2 weeks post-op.   Orders:  Orders Placed This Encounter  Procedures  . XR Knee 1-2 Views Left  . XR Knee 1-2 Views Right   No orders of the defined types were placed in this encounter.     Procedures: No procedures performed   Clinical Data: No additional findings.   Subjective: Chief Complaint  Patient presents with  . Right Knee - Pain  . Left Knee - Pain  The patient is a very pleasant 54 year old female with debilitating pain and arthritis of both her knees that been well-documented.  She basically has windswept knees with the right knee being in valgus malalignment in the left knee being in varus malalignment.  She does have a history of lupus.  Her knee swelled quite a bit.  She ambulates very slowly with a cane and has a significant lurch to her gait due to the deformity of both her knees and weakness in  both thighs and the muscles.  This is been worsening for many years now.  She did see my partner Dr. Cleophas Dunker who has sent her to [place] to consider bilateral knee replacement surgery and due to the significant deformity she has in both knees.  HPI  Review of Systems She currently denies any headache, chest pain, shortness of breath, fever, chills, nausea, vomiting  Objective: Vital Signs: Ht 5\' 4"  (1.626 m)   Wt 140 lb (63.5 kg)   BMI 24.03 kg/m   Physical Exam She is alert and orient x3 and in no acute distress Ortho Exam Examination of both her knees show significant swelling of both knees.  Both knees have difficult range of motion and some instability ligamentously on exam.  The right knee has valgus malalignment and the left knee has varus malalignment. Specialty Comments:  No specialty comments available.  Imaging: XR Knee 1-2 Views Left  Result Date: 09/26/2019 2 views left knee show severe end-stage arthritis with varus malalignment.  There is complete loss of the medial joint space and some slight subluxation knee joint.  There are periarticular osteophytes in all 3 compartments.  XR Knee 1-2 Views Right  Result Date: 09/26/2019 2 views of the right knee show profound end-stage arthritis with valgus malalignment.  This is basically a windswept looking knee because her left knee has varus malalignment.  There is complete loss of joint space on the lateral aspect of her right knee with slight subluxation of the joint.  There is significant periarticular osteophytes in all 3 apartments.    PMFS History: Patient Active Problem List   Diagnosis Date Noted  . Unilateral primary osteoarthritis, left knee 09/26/2019  . Unilateral primary osteoarthritis, right knee 09/26/2019  . Systemic lupus erythematosus arthritis (HCC) 09/12/2019  . Arthritis of both knees 09/12/2019  . Syncope 12/06/2016  . ETOH abuse 12/06/2016  . PAD (peripheral artery disease) (HCC) 12/06/2016  . LVH  (left ventricular hypertrophy) 05/26/2016  . Elevated troponin 05/24/2016  . HYPOKALEMIA 08/19/2008  . FATTY LIVER DISEASE 05/09/2008  . WEIGHT LOSS 03/28/2008  . RAYNAUDS SYNDROME 02/15/2008  . CARDIAC MURMUR 11/16/2007  . Systemic lupus erythematosus (HCC) 05/25/2006  . GOUT 02/02/2006  . DEPRESSION 02/02/2006  . MIGRAINE HEADACHE 02/02/2006  . Essential hypertension 02/02/2006  . ALLERGIC RHINITIS 02/02/2006  . Asthma 02/02/2006  . GERD 02/02/2006  . IBS 02/02/2006  . OVERACTIVE BLADDER 02/02/2006   Past Medical History:  Diagnosis Date  . Arthritis   . Elevated troponin 05/24/2016  . Finger amputation, no complication   . Hypercholesteremia   . Hypertension   . Lupus (HCC)     Family History  Problem Relation Age of Onset  . Pancreatic cancer Mother   . Colon cancer Father     Past Surgical History:  Procedure Laterality Date  . COLONOSCOPY N/A 01/17/2019   Procedure: COLONOSCOPY;  Surgeon: Corbin Ade, MD;  [facility];  Service: Endoscopy;  Laterality: N/A;  10:30  . left finger amputations    . POLYPECTOMY  01/17/2019   Procedure: POLYPECTOMY;  Surgeon: Corbin Ade, MD;  [facility];  Service: Endoscopy;;  . right hand surgery    . right knee arthroscopy    . RIGHT/LEFT HEART CATH AND CORONARY ANGIOGRAPHY N/A 05/25/2016   Procedure: Right/Left Heart Cath and Coronary Angiography;  Surgeon: Lennette Bihari, MD;  [facility];  Service: Cardiovascular;  Laterality: N/A;   Social History   Occupational History  . Occupation: unemployed  Tobacco Use  . Smoking status: Current Every Day Smoker    Packs/day: 0.25    Years: 15.00    Pack years: 3.75    Types: Cigarettes  . Smokeless tobacco: Never Used  Vaping Use  . Vaping Use: Never used  Substance and Sexual Activity  . Alcohol use: Yes    Alcohol/week: 3.0 standard drinks    Types: 3 Cans of beer per week  . Drug use: Yes    Types: Marijuana    Comment:  last time 01/16/2019  . Sexual activity: Not Currently

## 2019-10-15 DIAGNOSIS — I10 Essential (primary) hypertension: Secondary | ICD-10-CM | POA: Diagnosis not present

## 2019-10-15 DIAGNOSIS — N189 Chronic kidney disease, unspecified: Secondary | ICD-10-CM | POA: Diagnosis not present

## 2019-11-15 DIAGNOSIS — I10 Essential (primary) hypertension: Secondary | ICD-10-CM | POA: Diagnosis not present

## 2019-11-15 DIAGNOSIS — N189 Chronic kidney disease, unspecified: Secondary | ICD-10-CM | POA: Diagnosis not present

## 2019-12-06 ENCOUNTER — Ambulatory Visit (INDEPENDENT_AMBULATORY_CARE_PROVIDER_SITE_OTHER): Payer: Medicare Other | Admitting: Orthopaedic Surgery

## 2019-12-06 ENCOUNTER — Encounter: Payer: Self-pay | Admitting: Orthopaedic Surgery

## 2019-12-06 DIAGNOSIS — M1712 Unilateral primary osteoarthritis, left knee: Secondary | ICD-10-CM | POA: Diagnosis not present

## 2019-12-06 DIAGNOSIS — M1711 Unilateral primary osteoarthritis, right knee: Secondary | ICD-10-CM

## 2019-12-06 NOTE — Progress Notes (Signed)
The patient has known severe arthritis in both her knees with the left much worse than the right.  She was scheduled for a total knee arthroplasty in September of this year.  However, surgery had to be delayed due to to the COVID-19 pandemic.  After October 18, we are allowed to schedule inpatient surgeries again.  Hers was delayed because she was not a candidate for same-day surgery.  She is anxious to get back on the schedule.  I agree with this as well given the severity of her arthritis and pain with her left knee.  She has had no other acute change in her medical status.  Please see our previous office visit note in July.  She is ready proceed with a left total knee arthroplasty and I agree with this as well.  We will work on getting her on the schedule.

## 2019-12-10 DIAGNOSIS — I739 Peripheral vascular disease, unspecified: Secondary | ICD-10-CM | POA: Diagnosis not present

## 2019-12-10 DIAGNOSIS — I10 Essential (primary) hypertension: Secondary | ICD-10-CM | POA: Diagnosis not present

## 2019-12-10 DIAGNOSIS — M329 Systemic lupus erythematosus, unspecified: Secondary | ICD-10-CM | POA: Diagnosis not present

## 2019-12-18 ENCOUNTER — Other Ambulatory Visit: Payer: Self-pay

## 2019-12-21 ENCOUNTER — Other Ambulatory Visit: Payer: Self-pay | Admitting: Physician Assistant

## 2019-12-26 NOTE — Patient Instructions (Addendum)
DUE TO COVID-19 ONLY ONE VISITOR IS ALLOWED TO COME WITH YOU AND STAY IN THE WAITING ROOM ONLY DURING PRE OP AND PROCEDURE DAY OF SURGERY. THE 1 VISITOR  MAY VISIT WITH YOU AFTER SURGERY IN YOUR PRIVATE ROOM DURING VISITING HOURS ONLY!  YOU NEED TO HAVE A COVID 19 TEST ON__11/3____ @_10 :30______, THIS TEST MUST BE DONE BEFORE SURGERY,  COVID TESTING SITE [address] [place] , IT IS ON THE RIGHT GOING [address] APPROXIMATELY  2 MINUTES PAST [employer] ON THE RIGHT. ONCE YOUR COVID TEST IS COMPLETED,  PLEASE BEGIN THE QUARANTINE INSTRUCTIONS AS OUTLINED IN YOUR HANDOUT.                [id number]    Your procedure is scheduled on: 01/04/20   Report to [facility] Main  Entrance   Report to Short Stay at 5:30 AM     Call this number if you have problems the morning of surgery [phone]   . BRUSH YOUR TEETH MORNING OF SURGERY AND RINSE YOUR MOUTH OUT, NO CHEWING GUM CANDY OR MINTS.   No food after midnight.    You may have clear liquid until 4:30 AM.    At 4:30 AM drink pre surgery drink  . Nothing by mouth after 4:30 AM.   Take these medicines the morning of surgery with A SIP OF WATER: Gabapentin, Atenolol, Amlodipine, Omeprizole          Use your inhaler and bring with you to the hospital                               You may not have any metal on your body including hair pins and              piercings  Do not wear jewelry, make-up, lotions, powders or perfumes, deodorant             Do not wear nail polish on your fingernails.  Do not shave  48 hours prior to surgery.              Do not bring valuables to the hospital. [facility] IS NOT             RESPONSIBLE   FOR VALUABLES.  Contacts, dentures or bridgework may not be worn into surgery.  Leave suitcase in the car. After surgery it may be brought to your room.     Patients discharged the day of surgery will not be allowed to drive home.   IF YOU ARE HAVING SURGERY  AND GOING HOME THE SAME DAY, YOU MUST HAVE AN ADULT TO DRIVE YOU HOME AND BE WITH YOU FOR 24 HOURS.   YOU MAY GO HOME BY TAXI OR UBER OR ORTHERWISE, BUT AN ADULT MUST ACCOMPANY YOU HOME AND STAY WITH YOU FOR 24 HOURS.  Name and phone number of your driver:  Special Instructions: N/A              Please read over the following fact sheets you were given: _____________________________________________________________________             [facility] - Preparing for Surgery Before surgery, you can play an important role.   Because skin is not sterile, your skin needs to be as free of germs as possible .  You can reduce the number of germs on your skin by washing with CHG (chlorahexidine  gluconate) soap before surgery.   CHG is an antiseptic cleaner which kills germs and bonds with the skin to continue killing germs even after washing. Please DO NOT use if you have an allergy to CHG or antibacterial soaps.   If your skin becomes reddened/irritated stop using the CHG and inform your nurse when you arrive at Short Stay. Do not shave (including legs and underarms) for at least 48 hours prior to the first CHG shower.    Please follow these instructions carefully:  1.  Shower with CHG Soap the night before surgery and the  morning of Surgery.  2.  If you choose to wash your hair, wash your hair first as usual with your  normal  shampoo.  3.  After you shampoo, rinse your hair and body thoroughly to remove the  shampoo.                                        4.  Use CHG as you would any other liquid soap.  You can apply chg directly  to the skin and wash                       Gently with a scrungie or clean washcloth.  5.  Apply the CHG Soap to your body ONLY FROM THE NECK DOWN.   Do not use on face/ open                           Wound or open sores. Avoid contact with eyes, ears mouth and genitals (private parts).                       Wash face,  Genitals (private parts) with your normal soap.              6.  Wash thoroughly, paying special attention to the area where your surgery  will be performed.  7.  Thoroughly rinse your body with warm water from the neck down.  8.  DO NOT shower/wash with your normal soap after using and rinsing off  the CHG Soap.             9.  Pat yourself dry with a clean towel.            10.  Wear clean pajamas.            11.  Place clean sheets on your bed the night of your first shower and do not  sleep with pets. Day of Surgery : Do not apply any lotions/deodorants the morning of surgery.  Please wear clean clothes to the hospital/surgery center.  FAILURE TO FOLLOW THESE INSTRUCTIONS MAY RESULT IN THE CANCELLATION OF YOUR SURGERY PATIENT SIGNATURE_________________________________  NURSE SIGNATURE__________________________________  ________________________________________________________________________   Rogelia Mire  An incentive spirometer is a tool that can help keep your lungs clear and active. This tool measures how well you are filling your lungs with each breath. Taking long deep breaths may help reverse or decrease the chance of developing breathing (pulmonary) problems (especially infection) following:  A long period of time when you are unable to move or be active. BEFORE THE PROCEDURE   If the spirometer includes an indicator to show your best effort, your nurse or respiratory therapist will set it to a desired goal.  If possible, sit up straight or lean slightly forward. Try not to slouch.  Hold the incentive spirometer in an upright position. INSTRUCTIONS FOR USE  1. Sit on the edge of your bed if possible, or sit up as far as you can in bed or on a chair. 2. Hold the incentive spirometer in an upright position. 3. Breathe out normally. 4. Place the mouthpiece in your mouth and seal your lips tightly around it. 5. Breathe in slowly and as deeply as possible, raising the piston or the ball toward the top of the  column. 6. Hold your breath for 3-5 seconds or for as long as possible. Allow the piston or ball to fall to the bottom of the column. 7. Remove the mouthpiece from your mouth and breathe out normally. 8. Rest for a few seconds and repeat Steps 1 through 7 at least 10 times every 1-2 hours when you are awake. Take your time and take a few normal breaths between deep breaths. 9. The spirometer may include an indicator to show your best effort. Use the indicator as a goal to work toward during each repetition. 10. After each set of 10 deep breaths, practice coughing to be sure your lungs are clear. If you have an incision (the cut made at the time of surgery), support your incision when coughing by placing a pillow or rolled up towels firmly against it. Once you are able to get out of bed, walk around indoors and cough well. You may stop using the incentive spirometer when instructed by your caregiver.  RISKS AND COMPLICATIONS  Take your time so you do not get dizzy or light-headed.  If you are in pain, you may need to take or ask for pain medication before doing incentive spirometry. It is harder to take a deep breath if you are having pain. AFTER USE  Rest and breathe slowly and easily.  It can be helpful to keep track of a log of your progress. Your caregiver can provide you with a simple table to help with this. If you are using the spirometer at home, follow these instructions: SEEK MEDICAL CARE IF:   You are having difficultly using the spirometer.  You have trouble using the spirometer as often as instructed.  Your pain medication is not giving enough relief while using the spirometer.  You develop fever of 100.5 F (38.1 C) or higher. SEEK IMMEDIATE MEDICAL CARE IF:   You cough up bloody sputum that had not been present before.  You develop fever of 102 F (38.9 C) or greater.  You develop worsening pain at or near the incision site. MAKE SURE YOU:   Understand these  instructions.  Will watch your condition.  Will get help right away if you are not doing well or get worse. Document Released: 06/28/2006 Document Revised: 05/10/2011 Document Reviewed: 08/29/2006 [facility] Patient Information 2014 [place], [place].   ________________________________________________________________________

## 2019-12-27 ENCOUNTER — Encounter (HOSPITAL_COMMUNITY)
Admission: RE | Admit: 2019-12-27 | Discharge: 2019-12-27 | Disposition: A | Discharge status: Home / Self Care | Payer: Medicare Other | Source: Ambulatory Visit | Attending: Orthopaedic Surgery | Admitting: Orthopaedic Surgery

## 2019-12-27 ENCOUNTER — Other Ambulatory Visit: Payer: Self-pay

## 2019-12-27 ENCOUNTER — Encounter (HOSPITAL_COMMUNITY): Payer: Self-pay

## 2019-12-27 DIAGNOSIS — Z7901 Long term (current) use of anticoagulants: Secondary | ICD-10-CM | POA: Insufficient documentation

## 2019-12-27 DIAGNOSIS — Z01818 Encounter for other preprocedural examination: Secondary | ICD-10-CM | POA: Diagnosis not present

## 2019-12-27 DIAGNOSIS — Z7982 Long term (current) use of aspirin: Secondary | ICD-10-CM | POA: Insufficient documentation

## 2019-12-27 DIAGNOSIS — M1712 Unilateral primary osteoarthritis, left knee: Secondary | ICD-10-CM | POA: Insufficient documentation

## 2019-12-27 DIAGNOSIS — Z79899 Other long term (current) drug therapy: Secondary | ICD-10-CM | POA: Insufficient documentation

## 2019-12-27 DIAGNOSIS — I10 Essential (primary) hypertension: Secondary | ICD-10-CM | POA: Diagnosis not present

## 2019-12-27 DIAGNOSIS — F17210 Nicotine dependence, cigarettes, uncomplicated: Secondary | ICD-10-CM | POA: Insufficient documentation

## 2019-12-27 HISTORY — DX: Cardiac murmur, unspecified: R01.1

## 2019-12-27 LAB — CBC
HCT: 40.9 % (ref 36.0–46.0)
Hemoglobin: 12.8 g/dL (ref 12.0–15.0)
MCH: 31.1 pg (ref 26.0–34.0)
MCHC: 31.3 g/dL (ref 30.0–36.0)
MCV: 99.5 fL (ref 80.0–100.0)
Platelets: 144 10*3/uL — ABNORMAL LOW (ref 150–400)
RBC: 4.11 MIL/uL (ref 3.87–5.11)
RDW: 13.6 % (ref 11.5–15.5)
WBC: 6.7 10*3/uL (ref 4.0–10.5)
nRBC: 0 % (ref 0.0–0.2)

## 2019-12-27 LAB — BASIC METABOLIC PANEL
Anion gap: 11 (ref 5–15)
BUN: 10 mg/dL (ref 6–20)
CO2: 22 mmol/L (ref 22–32)
Calcium: 9.3 mg/dL (ref 8.9–10.3)
Chloride: 104 mmol/L (ref 98–111)
Creatinine, Ser: 1.01 mg/dL — ABNORMAL HIGH (ref 0.44–1.00)
GFR, Estimated: >60 mL/min (ref >60)
Glucose, Bld: 133 mg/dL — ABNORMAL HIGH (ref 70–99)
Potassium: 4.4 mmol/L (ref 3.5–5.1)
Sodium: 137 mmol/L (ref 135–145)

## 2019-12-27 LAB — SURGICAL PCR SCREEN
MRSA, PCR: NEGATIVE
Staphylococcus aureus: NEGATIVE

## 2019-12-27 NOTE — Progress Notes (Signed)
COVID Vaccine Completed:Yes Date COVID Vaccine completed:08/11/19 COVID vaccine manufacturer:  Moderna     PCP - Dr. Duncan Dull Cardiologist - Dr. Bishop Limbo  Chest x-ray - no Stress Test - no ECHO - 2018- Epic Cardiac Cath - 2018-Epic Pacemaker/ICD device last checked:NA  Sleep Study - no CPAP -   Fasting Blood Sugar - NA Checks Blood Sugar _____ times a day  Blood Thinner Instructions:[facility]/ Dr. Felecia Shelling Aspirin Instructions:none received/ Magnus Ivan Last Dose:  Anesthesia review:   Patient denies shortness of breath, fever, cough and chest pain at PAT appointment yes   Patient verbalized understanding of instructions that were given to them at the PAT appointment. Patient was also instructed that they will need to review over the PAT instructions again at home before surgery. Yes Pt has a sedentary lifestyle. She has lupus. She doesn't climb stairs. She reports no SOB with ADLs

## 2019-12-28 NOTE — Progress Notes (Signed)
Anesthesia Chart Review:   Case: [id number] Date/Time: 01/04/20 0700   Procedure: LEFT TOTAL KNEE ARTHROPLASTY (Left Knee)   Anesthesia type: Spinal   Pre-op diagnosis: Osteoarthritis Left Knee   Location: [facility] 09 / [facility]   Surgeons: Mcarthur Rossetti, MD      DISCUSSION:  Pt is a 54 year old with hx NSTEMI (2018; normal coronaries on cath), HTN, severe LVH (by 2018 echo), mild mitral stenosis (by 2018 echo). Current smoker.   Reviewed case with Dr. Fransisco Beau.  Pt will need cardiology evaluation before surgery. I notified Sherrie in Dr. Trevor Mace office.    VS: BP 111/77   Pulse (!) 58   Temp 36.8 C (Oral)   Resp 18   Ht _0  (1.626 m)   Wt 65 kg   SpO2 98%   BMI 24.60 kg/m    PROVIDERS: - PCP is Rosita Fire, MD - Was seen by cardiology in 2018; last office visit 06/02/16 with Carlyle Dolly, MD. Note documents "some question about diffuse hypertrophic CM." 6 month f/u recommended but did not happen.    LABS: Labs reviewed: Acceptable for surgery. (all labs ordered are listed, but only abnormal results are displayed)  Labs Reviewed  BASIC METABOLIC PANEL - Abnormal; Notable for the following components:      Result Value   Glucose, Bld 133 (*)    Creatinine, Ser 1.01 (*)    All other components within normal limits  CBC - Abnormal; Notable for the following components:   Platelets 144 (*)    All other components within normal limits  SURGICAL PCR SCREEN  TYPE AND SCREEN    EKG 12/27/19: Sinus bradycardia. Septal infarct, age undetermined. Lateral ischemia.  - Compared to 12/06/2016, high lateral T wave inversion new, LVH criteria by voltage in lateral leads not present   CV: Carotid duplex 12/07/16:  - Technically difficult due to extemely high bifurcation.  - Bilateral - 1% to 39% ICA stenosis.  - Left extrenal carotid artey could not be visualized due to extremely high bifurcation.  - Right vertebral artery flow is antegrade.  - Left - Unable to  insonate the verterbal artery due to respiratory interference.    Echo 12/06/16:  - Left ventricle: The cavity size was normal. Wall thickness was increased in a pattern of severe LVH. Systolic function was vigorous. The estimated ejection fraction was in the range of 65% to 70%. Wall motion was normal; there were no regional wall motion abnormalities. Doppler parameters are consistent with abnormal left ventricular relaxation (grade 1 diastolic dysfunction). Doppler parameters are consistent with high ventricular filling pressure.  - Mitral valve: Severely calcified annulus. The findings are consistent with mild stenosis. Valve area by pressure half-time: 1.68 cm^2. Valve area by continuity equation (using LVOT flow): 2.04 cm^2.  - Left atrium: The atrium was mildly dilated.  - Pulmonary arteries: PA peak pressure: 33 mm Hg (S).  - Impressions: Vigorous LV systolic function; severe LVH; mild diastolic dysfunction with elevated LV filling pressure; intracavitary gradient of approximately 2.3 m/s; mild LAE; severe MAC with mild MS.    Cardiac cath 05/25/16:  - Hyperdynamic LV function with LVH and an ejection fraction of approximately 70%.  Significant mitral annular calcification. - Normal coronary arteries. - Mild elevation of right heart pressures. RECOMMENDATION: Medical therapy for chronic diastolic heart failure and hypertension.    Past Medical History:  Diagnosis Date  . Anginal pain ([place]) 2020  . Arthritis    Knees, hips,  .  Elevated troponin 05/24/2016  . Finger amputation, no complication   . Heart murmur   . Hypercholesteremia   . Hypertension   . Lupus [facility])     Past Surgical History:  Procedure Laterality Date  . CARDIAC CATHETERIZATION    . COLONOSCOPY N/A 01/17/2019   Procedure: COLONOSCOPY;  Surgeon: Daneil Dolin, MD;  Location: [facility];  Service: Endoscopy;  Laterality: N/A;  10:30  . left finger amputations    . POLYPECTOMY  01/17/2019   Procedure:  POLYPECTOMY;  Surgeon: Daneil Dolin, MD;  Location[facility];  Service: Endoscopy;;  . right hand surgery    . right knee arthroscopy    . RIGHT/LEFT HEART CATH AND CORONARY ANGIOGRAPHY N/A 05/25/2016   Procedure: Right/Left Heart Cath and Coronary Angiography;  Surgeon: Troy Sine, MD;  Location: [place] CV LAB;  Service: Cardiovascular;  Laterality: N/A;    MEDICATIONS: . albuterol (PROVENTIL HFA;VENTOLIN HFA) 108 (90 Base) MCG/ACT inhaler  . allopurinol (ZYLOPRIM) 100 MG tablet  . amLODipine (NORVASC) 10 MG tablet  . aspirin EC 81 MG EC tablet  . atenolol (TENORMIN) 25 MG tablet  . atorvastatin (LIPITOR) 40 MG tablet  . benazepril (LOTENSIN) 40 MG tablet  . diclofenac sodium (VOLTAREN) 1 % GEL  . furosemide (LASIX) 40 MG tablet  . gabapentin (NEURONTIN) 300 MG capsule  . ibuprofen (ADVIL,MOTRIN) 800 MG tablet  . Ketotifen Fumarate (ITCHY EYE DROPS OP)  . omeprazole (PRILOSEC) 20 MG capsule  . potassium chloride (K-DUR) 10 MEQ tablet  . spironolactone (ALDACTONE) 25 MG tablet  . zolpidem (AMBIEN) 10 MG tablet   No current facility-administered medications for this encounter.   Willeen Cass, PhD, FNP-BC [facility]/Anesthesiology Phone: [phone] 12/28/2019 1:30 PM

## 2020-01-02 ENCOUNTER — Other Ambulatory Visit (HOSPITAL_COMMUNITY): Payer: Medicare Other

## 2020-01-02 ENCOUNTER — Ambulatory Visit (INDEPENDENT_AMBULATORY_CARE_PROVIDER_SITE_OTHER): Payer: Medicare Other | Admitting: Cardiology

## 2020-01-02 ENCOUNTER — Encounter: Payer: Self-pay | Admitting: Cardiology

## 2020-01-02 ENCOUNTER — Other Ambulatory Visit (HOSPITAL_COMMUNITY)
Admission: RE | Admit: 2020-01-02 | Discharge: 2020-01-02 | Disposition: A | Discharge status: Home / Self Care | Payer: Medicare Other | Source: Ambulatory Visit | Attending: Orthopaedic Surgery | Admitting: Orthopaedic Surgery

## 2020-01-02 VITALS — BP 94/68 | HR 60 | Ht 64.0 in | Wt 143.6 lb

## 2020-01-02 DIAGNOSIS — Z20822 Contact with and (suspected) exposure to covid-19: Secondary | ICD-10-CM | POA: Diagnosis not present

## 2020-01-02 DIAGNOSIS — Z01812 Encounter for preprocedural laboratory examination: Secondary | ICD-10-CM | POA: Diagnosis not present

## 2020-01-02 DIAGNOSIS — Z01810 Encounter for preprocedural cardiovascular examination: Secondary | ICD-10-CM

## 2020-01-02 DIAGNOSIS — I739 Peripheral vascular disease, unspecified: Secondary | ICD-10-CM

## 2020-01-02 DIAGNOSIS — R0789 Other chest pain: Secondary | ICD-10-CM | POA: Diagnosis not present

## 2020-01-02 DIAGNOSIS — I10 Essential (primary) hypertension: Secondary | ICD-10-CM | POA: Diagnosis not present

## 2020-01-02 LAB — SARS CORONAVIRUS 2 (TAT 6-24 HRS): SARS Coronavirus 2: NEGATIVE

## 2020-01-02 NOTE — Addendum Note (Signed)
Addended by: Lesle Chris on: 01/02/2020 09:55 AM   Modules accepted: Orders

## 2020-01-02 NOTE — Patient Instructions (Signed)

## 2020-01-02 NOTE — Progress Notes (Signed)
Clinical Summary Crystal Patterson is a 54 y.o.female seen today for follow up of the following medical problems.   1. Chest pain - admit 04/2016 with NSTEMI, cath without significant CAD. Peak trop 1.23 - echo 04/2016 LVEF 75%, no WMAs. Grade I diastolic dysfunction. Elevated LA pressure. Severe LAE. Some question about diffuse hypertrophic CM.  - cath 04/2016: no significant CAD. RHC mean PA 20, PCWP 12,   - no recent chest pain. No SOB or DOE - limited by knee pains as far as exertion    2. PAD - history of upper extremity PAD, Raynauds. Previous digit amputations   3. HTN -compliant with meds    4. Preoperative evaluation - considering knee replacement surgery - exertion limited by chronic knee pains - extensive cardiac testing in 2018 was benign - no recent cardiopulmonary symptoms.    Past Medical History:  Diagnosis Date  . Anginal pain ([place]) 2020  . Arthritis    Knees, hips,  . Elevated troponin 05/24/2016  . Finger amputation, no complication   . Heart murmur   . Hypercholesteremia   . Hypertension   . Lupus ([place])      No Known Allergies   Current Outpatient Medications  Medication Sig Dispense Refill  . albuterol (PROVENTIL HFA;VENTOLIN HFA) 108 (90 Base) MCG/ACT inhaler Inhale 2 puffs into the lungs every 6 (six) hours as needed for wheezing or shortness of breath. (Patient not taking: Reported on 12/20/2019)    . allopurinol (ZYLOPRIM) 100 MG tablet Take 1 tablet (100 mg total) by mouth daily. 30 tablet 3  . amLODipine (NORVASC) 10 MG tablet Take 1 tablet (10 mg total) by mouth daily. 30 tablet 11  . aspirin EC 81 MG EC tablet Take 1 tablet (81 mg total) by mouth daily.    [name] atenolol (TENORMIN) 25 MG tablet Take 1 tablet (25 mg total) by mouth daily. 30 tablet 11  . atorvastatin (LIPITOR) 40 MG tablet Take 1 tablet (40 mg total) by mouth daily at 6 PM. (Patient taking differently: Take 40 mg by mouth daily. ) 30 tablet 11  . benazepril (LOTENSIN)  40 MG tablet Take 1 tablet (40 mg total) by mouth daily. 30 tablet 11  . diclofenac sodium (VOLTAREN) 1 % GEL Apply 1 application topically 4 (four) times daily as needed (pain). (Patient not taking: Reported on 12/20/2019)    . furosemide (LASIX) 40 MG tablet Take 1 tablet (40 mg total) by mouth daily. 30 tablet 11  . gabapentin (NEURONTIN) 300 MG capsule Take 300 mg by mouth daily.     [name] ibuprofen (ADVIL,MOTRIN) 800 MG tablet Take 800 mg by mouth 2 (two) times daily as needed for headache or moderate pain.     [name] Ketotifen Fumarate (ITCHY EYE DROPS OP) Place 1 drop into both eyes daily as needed (itchy eyes). (Patient not taking: Reported on 12/20/2019)    . omeprazole (PRILOSEC) 20 MG capsule Take 20 mg by mouth daily.    . potassium chloride (K-DUR) 10 MEQ tablet Take 10 mEq by mouth daily.     [name] spironolactone (ALDACTONE) 25 MG tablet Take 1 tablet (25 mg total) by mouth 2 (two) times daily. (Patient taking differently: Take 25 mg by mouth daily. ) 30 tablet 11  . zolpidem (AMBIEN) 10 MG tablet Take 10 mg by mouth at bedtime.     No current facility-administered medications for this visit.     Past Surgical History:  Procedure Laterality Date  . CARDIAC CATHETERIZATION    .  COLONOSCOPY N/A 01/17/2019   Procedure: COLONOSCOPY;  Surgeon: Daneil Dolin, MD;  [facility];  Service: Endoscopy;  Laterality: N/A;  10:30  . left finger amputations    . POLYPECTOMY  01/17/2019   Procedure: POLYPECTOMY;  Surgeon: Daneil Dolin, MD;  [facility];  Service: Endoscopy;;  . right hand surgery    . right knee arthroscopy    . RIGHT/LEFT HEART CATH AND CORONARY ANGIOGRAPHY N/A 05/25/2016   Procedure: Right/Left Heart Cath and Coronary Angiography;  Surgeon: Troy Sine, MD;  Location: [place] CV LAB;  Service: Cardiovascular;  Laterality: N/A;     No Known Allergies    Family History  Problem Relation Age of Onset  . Pancreatic cancer Mother   . Colon cancer  Father      Social History Ms. Boultinghouse reports that she has been smoking cigarettes. She has a 3.75 pack-year smoking history. She has never used smokeless tobacco. Ms. Mclaurin reports current alcohol use of about 3.0 standard drinks of alcohol per week.   Review of Systems CONSTITUTIONAL: No weight loss, fever, chills, weakness or fatigue.  HEENT: Eyes: No visual loss, blurred vision, double vision or yellow sclerae.No hearing loss, sneezing, congestion, runny nose or sore throat.  SKIN: No rash or itching.  CARDIOVASCULAR: per hpi RESPIRATORY: No shortness of breath, cough or sputum.  GASTROINTESTINAL: No anorexia, nausea, vomiting or diarrhea. No abdominal pain or blood.  GENITOURINARY: No burning on urination, no polyuria NEUROLOGICAL: No headache, dizziness, syncope, paralysis, ataxia, numbness or tingling in the extremities. No change in bowel or bladder control.  MUSCULOSKELETAL: No muscle, back pain, joint pain or stiffness.  LYMPHATICS: No enlarged nodes. No history of splenectomy.  PSYCHIATRIC: No history of depression or anxiety.  ENDOCRINOLOGIC: No reports of sweating, cold or heat intolerance. No polyuria or polydipsia.  [name]   Physical Examination Today's Vitals   01/02/20 0852  BP: 94/68  Pulse: 60  Weight: 143 lb 9.6 oz (65.1 kg)  Height: _0  (1.626 m)   Body mass index is 24.65 kg/m.  Gen: resting comfortably, no acute distress HEENT: no scleral icterus, pupils equal round and reactive, no palptable cervical adenopathy,  CV: RRR, no mr/g ,no jvd Resp: Clear to auscultation bilaterally GI: abdomen is soft, non-tender, non-distended, normal bowel sounds, no hepatosplenomegaly MSK: extremities are warm, no edema.  Skin: warm, no rash Neuro:  no focal deficits Psych: appropriate affect   Diagnostic Studies  04/2016 echo Study Conclusions  - Left ventricle: Small LV cavity. Wall thickness was increased in a pattern of severe LVH. The estimated ejection  fraction was 75%. Wall motion was normal; there were no regional wall motion abnormalities. Doppler parameters are consistent with abnormal left ventricular relaxation (grade 1 diastolic dysfunction). The E/e&' ratio is >20, suggesting markedly elevated LV filling pressure. - Mitral valve: Calcified annulus. Mildly thickened leaflets . Mild stenosis. There was mild regurgitation. Valve area by pressure half-time: 2.18 cm^2. - Left atrium: Severely dilated. - Inferior vena cava: The vessel was normal in size. The respirophasic diameter changes were in the normal range (= 50%), consistent with normal central venous pressure.  Impressions:  - LVEF 75%, severely increased LV wall thickness with small LV cavity, grade 1 DD with high LV filling pressure, moderate MAC with mild mitral stenosis and mild regurgitation, severe LAE, normal IVC, no pericardial efusion. Consider global variant hypertrophic cardiomopathy given the degree of LV wall thickness and cardiac mass.   04/2016 Cath Dominance:  Right  Left Main  Vessel was injected. Vessel is normal in caliber. Vessel is angiographically normal.  Left Anterior Descending  Vessel was injected. Vessel is normal in caliber. Vessel is angiographically normal.  Ramus Intermedius  Vessel is small.  Left Circumflex  Vessel was injected. Vessel is normal in caliber. Vessel is angiographically normal.  Right Coronary Artery  Vessel was injected. Vessel is normal in caliber. Vessel is angiographically normal.  Right Heart   Right Heart Pressures RA: A 10, V 6; mean 5 RV: 36/7 PA: 33/13; mean 20 PW: a 24 V 17; mean 12  AO: 98/65 PA: 34/16  PW Mean 15 LV: 96/16  LV: 98/16 AO: 99/76  Oxygen saturation in the pulmonary artery, 77% and in the aorta 99%.  Cardiac output by the thermodilution method 3.3, and by the Fick method 5.5 L/m. Cardiac index by the thermodilution method 2.0, and by the Fick  method 3.3 L/m/m.        Assessment and Plan  1. Chest pain - no recent symptoms, previous cath was benign - continue to monitor.  - EKG today SR, nonspecific ST/T changes  2. PAD - continue medical therapy.   3. HTN - manual recheck was 110/70 at goal, continue current meds  4. Preoperative evaluation - considering knee replacement surgery - no active cardiopulmonary symptoms - exertion limited by chronic knee pain, cannot assess by history - extensive cardiac testing in 2018 including cath and echo that were benign - would recommend proceeding with surgery as planned, no repeat cardiac testing is indicated    Arnoldo Lenis, M.D.

## 2020-01-03 ENCOUNTER — Telehealth: Payer: Self-pay | Admitting: *Deleted

## 2020-01-03 NOTE — Care Plan (Signed)
RNCM call to patient to review her upcoming Left total knee arthroplasty with Dr. Magnus Ivan. She is an Ortho bundle patient through [facility] and is agreeable to case management. She has a daughter that will be assisting after discharge. She has a FWW, but will need a 3in1/BSC. Will order through [employer] to be delivered. Anticipate HHPT will be needed after hospital stay. Choice provided and referral made to [facility]. Reviewed all post-op care instructions. Will continue to follow for needs.

## 2020-01-03 NOTE — Anesthesia Preprocedure Evaluation (Addendum)
Anesthesia Evaluation  Patient identified by MRN, date of birth, ID band Patient awake    Reviewed: Allergy & Precautions, NPO status , Patient's Chart, lab work & pertinent test results, reviewed documented beta blocker date and time   Airway Mallampati: II  TM Distance: >3 FB Neck ROM: Full    Dental  (+) Teeth Intact   Pulmonary asthma , Current Smoker and Patient abstained from smoking.,    Pulmonary exam normal breath sounds clear to auscultation       Cardiovascular hypertension, Pt. on home beta blockers and Pt. on medications + angina + Peripheral Vascular Disease  Normal cardiovascular exam Rhythm:Regular Rate:Normal     Neuro/Psych  Headaches, PSYCHIATRIC DISORDERS Depression    GI/Hepatic Neg liver ROS, GERD  Medicated,  Endo/Other  Lupus  Renal/GU negative Renal ROS     Musculoskeletal  (+) Arthritis ,   Abdominal   Peds  Hematology  (+) Blood dyscrasia (Plt 144k), ,   Anesthesia Other Findings   Reproductive/Obstetrics                            Anesthesia Physical Anesthesia Plan  ASA: III  Anesthesia Plan: Spinal   Post-op Pain Management:  Regional for Post-op pain   Induction: Intravenous  PONV Risk Score and Plan: 1 and Propofol infusion, Midazolam, Dexamethasone and Ondansetron  Airway Management Planned: Natural Airway and Nasal Cannula  Additional Equipment:   Intra-op Plan:   Post-operative Plan:   Informed Consent: I have reviewed the patients History and Physical, chart, labs and discussed the procedure including the risks, benefits and alternatives for the proposed anesthesia with the patient or authorized representative who has indicated his/her understanding and acceptance.       Plan Discussed with: CRNA  Anesthesia Plan Comments:        Anesthesia Quick Evaluation

## 2020-01-03 NOTE — H&P (Signed)
TOTAL KNEE ADMISSION H&P  Patient is being admitted for left total knee arthroplasty.  Subjective:  Chief Complaint:left knee pain.  HPI: Crystal Patterson, 54 y.o. female, has a history of pain and functional disability in the left knee due to arthritis and has failed non-surgical conservative treatments for greater than 12 weeks to includeNSAID's and/or analgesics, corticosteriod injections, viscosupplementation injections, flexibility and strengthening excercises, use of assistive devices and activity modification.  Onset of symptoms was gradual, starting 5 years ago with gradually worsening course since that time. The patient noted no past surgery on the left knee(s).  Patient currently rates pain in the left knee(s) at 10 out of 10 with activity. Patient has night pain, worsening of pain with activity and weight bearing, pain that interferes with activities of daily living, pain with passive range of motion, crepitus and joint swelling.  Patient has evidence of subchondral sclerosis, periarticular osteophytes, joint subluxation and joint space narrowing by imaging studies. There is no active infection.  Patient Active Problem List   Diagnosis Date Noted  . Unilateral primary osteoarthritis, left knee 09/26/2019  . Unilateral primary osteoarthritis, right knee 09/26/2019  . Systemic lupus erythematosus arthritis (HCC) 09/12/2019  . Arthritis of both knees 09/12/2019  . Syncope 12/06/2016  . ETOH abuse 12/06/2016  . PAD (peripheral artery disease) (HCC) 12/06/2016  . LVH (left ventricular hypertrophy) 05/26/2016  . Elevated troponin 05/24/2016  . HYPOKALEMIA 08/19/2008  . FATTY LIVER DISEASE 05/09/2008  . WEIGHT LOSS 03/28/2008  . RAYNAUDS SYNDROME 02/15/2008  . CARDIAC MURMUR 11/16/2007  . Systemic lupus erythematosus (HCC) 05/25/2006  . GOUT 02/02/2006  . DEPRESSION 02/02/2006  . MIGRAINE HEADACHE 02/02/2006  . Essential hypertension 02/02/2006  . ALLERGIC RHINITIS 02/02/2006  .  Asthma 02/02/2006  . GERD 02/02/2006  . IBS 02/02/2006  . OVERACTIVE BLADDER 02/02/2006   Past Medical History:  Diagnosis Date  . Anginal pain ([facility]) 2020  . Arthritis    Knees, hips,  . Elevated troponin 05/24/2016  . Finger amputation, no complication   . Heart murmur   . Hypercholesteremia   . Hypertension   . Lupus [facility])     Past Surgical History:  Procedure Laterality Date  . CARDIAC CATHETERIZATION    . COLONOSCOPY N/A 01/17/2019   Procedure: COLONOSCOPY;  Surgeon: Corbin Ade, MD;  [facility];  Service: Endoscopy;  Laterality: N/A;  10:30  . left finger amputations    . POLYPECTOMY  01/17/2019   Procedure: POLYPECTOMY;  Surgeon: Corbin Ade, MD;  [facility];  Service: Endoscopy;;  . right hand surgery    . right knee arthroscopy    . RIGHT/LEFT HEART CATH AND CORONARY ANGIOGRAPHY N/A 05/25/2016   Procedure: Right/Left Heart Cath and Coronary Angiography;  Surgeon: Lennette Bihari, MD;  Location: [facility] LAB;  Service: Cardiovascular;  Laterality: N/A;    No current facility-administered medications for this encounter.   Current Outpatient Medications  Medication Sig Dispense Refill Last Dose  . allopurinol (ZYLOPRIM) 100 MG tablet Take 1 tablet (100 mg total) by mouth daily. 30 tablet 3   . amLODipine (NORVASC) 10 MG tablet Take 1 tablet (10 mg total) by mouth daily. 30 tablet 11   . aspirin EC 81 MG EC tablet Take 1 tablet (81 mg total) by mouth daily.     [name] atenolol (TENORMIN) 25 MG tablet Take 1 tablet (25 mg total) by mouth daily. 30 tablet 11   . atorvastatin (LIPITOR) 40 MG tablet Take  1 tablet (40 mg total) by mouth daily at 6 PM. (Patient taking differently: Take 40 mg by mouth daily. ) 30 tablet 11   . benazepril (LOTENSIN) 40 MG tablet Take 1 tablet (40 mg total) by mouth daily. 30 tablet 11   . furosemide (LASIX) 40 MG tablet Take 1 tablet (40 mg total) by mouth daily. 30 tablet 11   . gabapentin (NEURONTIN) 300 MG  capsule Take 300 mg by mouth daily.      [name] ibuprofen (ADVIL,MOTRIN) 800 MG tablet Take 800 mg by mouth 2 (two) times daily as needed for headache or moderate pain.      [name] omeprazole (PRILOSEC) 20 MG capsule Take 20 mg by mouth daily.     . potassium chloride (K-DUR) 10 MEQ tablet Take 10 mEq by mouth daily.      [name] zolpidem (AMBIEN) 10 MG tablet Take 10 mg by mouth at bedtime.     [name] albuterol (PROVENTIL HFA;VENTOLIN HFA) 108 (90 Base) MCG/ACT inhaler Inhale 2 puffs into the lungs every 6 (six) hours as needed for wheezing or shortness of breath.    Not Taking at Unknown time  . diclofenac sodium (VOLTAREN) 1 % GEL Apply 1 application topically 4 (four) times daily as needed (pain).    Not Taking at Unknown time  . Ketotifen Fumarate (ITCHY EYE DROPS OP) Place 1 drop into both eyes daily as needed (itchy eyes).    Not Taking at Unknown time  . spironolactone (ALDACTONE) 25 MG tablet Take 25 mg by mouth daily.      No Known Allergies  Social History   Tobacco Use  . Smoking status: Current Every Day Smoker    Packs/day: 0.25    Years: 15.00    Pack years: 3.75    Types: Cigarettes  . Smokeless tobacco: Never Used  Substance Use Topics  . Alcohol use: Yes    Alcohol/week: 3.0 standard drinks    Types: 3 Cans of beer per week    Family History  Problem Relation Age of Onset  . Pancreatic cancer Mother   . Colon cancer Father      Review of Systems  Musculoskeletal: Positive for gait problem and joint swelling.  All other systems reviewed and are negative.   Objective:  Physical Exam Vitals reviewed.  Constitutional:      Appearance: Normal appearance.  HENT:     Head: Normocephalic and atraumatic.  Eyes:     Extraocular Movements: Extraocular movements intact.     Pupils: Pupils are equal, round, and reactive to light.  Cardiovascular:     Rate and Rhythm: Normal rate and regular rhythm.     Pulses: Normal pulses.  Pulmonary:     Effort: Pulmonary effort is normal.      Breath sounds: Normal breath sounds.  Abdominal:     Palpations: Abdomen is soft.  Musculoskeletal:     Cervical back: Normal range of motion.     Left knee: Deformity, effusion and bony tenderness present. Decreased range of motion. Tenderness present over the medial joint line and lateral joint line. Abnormal alignment.  Neurological:     Mental Status: She is alert and oriented to person, place, and time.  Psychiatric:        Behavior: Behavior normal.     Vital signs in last 24 hours:    Labs:   Estimated body mass index is 24.65 kg/m as calculated from the following:   Height as of 01/02/20: 5\' 4"  (1.626  m).   Weight as of 01/02/20: 65.1 kg.   Imaging Review Plain radiographs demonstrate severe degenerative joint disease of the left knee(s). The overall alignment issignificant varus. The bone quality appears to be good for age and reported activity level.      Assessment/Plan:  End stage arthritis, left knee   The patient history, physical examination, clinical judgment of the provider and imaging studies are consistent with end stage degenerative joint disease of the left knee(s) and total knee arthroplasty is deemed medically necessary. The treatment options including medical management, injection therapy arthroscopy and arthroplasty were discussed at length. The risks and benefits of total knee arthroplasty were presented and reviewed. The risks due to aseptic loosening, infection, stiffness, patella tracking problems, thromboembolic complications and other imponderables were discussed. The patient acknowledged the explanation, agreed to proceed with the plan and consent was signed. Patient is being admitted for inpatient treatment for surgery, pain control, PT, OT, prophylactic antibiotics, VTE prophylaxis, progressive ambulation and ADL's and discharge planning. The patient is planning to be discharged home with home health services

## 2020-01-03 NOTE — Telephone Encounter (Signed)
Ortho bundle Pre-op call completed. 

## 2020-01-04 ENCOUNTER — Encounter (HOSPITAL_COMMUNITY)
Admission: RE | Disposition: A | Discharge status: Home Health | Payer: Self-pay | Source: Home / Self Care | Attending: Orthopaedic Surgery

## 2020-01-04 ENCOUNTER — Other Ambulatory Visit: Payer: Self-pay

## 2020-01-04 ENCOUNTER — Ambulatory Visit (HOSPITAL_COMMUNITY): Payer: Medicare Other | Admitting: Emergency Medicine

## 2020-01-04 ENCOUNTER — Observation Stay (HOSPITAL_COMMUNITY): Payer: Medicare Other

## 2020-01-04 ENCOUNTER — Inpatient Hospital Stay (HOSPITAL_COMMUNITY)
Admission: RE | Admit: 2020-01-04 | Discharge: 2020-01-07 | DRG: 470 | Disposition: A | Discharge status: Home Health | Payer: Medicare Other | Attending: Orthopaedic Surgery | Admitting: Orthopaedic Surgery

## 2020-01-04 ENCOUNTER — Encounter (HOSPITAL_COMMUNITY): Payer: Self-pay | Admitting: Orthopaedic Surgery

## 2020-01-04 ENCOUNTER — Ambulatory Visit (HOSPITAL_COMMUNITY): Payer: Medicare Other | Admitting: Anesthesiology

## 2020-01-04 DIAGNOSIS — Z96659 Presence of unspecified artificial knee joint: Secondary | ICD-10-CM

## 2020-01-04 DIAGNOSIS — J45909 Unspecified asthma, uncomplicated: Secondary | ICD-10-CM | POA: Diagnosis present

## 2020-01-04 DIAGNOSIS — Z79899 Other long term (current) drug therapy: Secondary | ICD-10-CM

## 2020-01-04 DIAGNOSIS — E7800 Pure hypercholesterolemia, unspecified: Secondary | ICD-10-CM | POA: Diagnosis not present

## 2020-01-04 DIAGNOSIS — Z7982 Long term (current) use of aspirin: Secondary | ICD-10-CM

## 2020-01-04 DIAGNOSIS — G8918 Other acute postprocedural pain: Secondary | ICD-10-CM | POA: Diagnosis not present

## 2020-01-04 DIAGNOSIS — K760 Fatty (change of) liver, not elsewhere classified: Secondary | ICD-10-CM | POA: Diagnosis not present

## 2020-01-04 DIAGNOSIS — F17210 Nicotine dependence, cigarettes, uncomplicated: Secondary | ICD-10-CM | POA: Diagnosis not present

## 2020-01-04 DIAGNOSIS — I10 Essential (primary) hypertension: Secondary | ICD-10-CM | POA: Diagnosis present

## 2020-01-04 DIAGNOSIS — Z471 Aftercare following joint replacement surgery: Secondary | ICD-10-CM | POA: Diagnosis not present

## 2020-01-04 DIAGNOSIS — M1712 Unilateral primary osteoarthritis, left knee: Principal | ICD-10-CM | POA: Diagnosis present

## 2020-01-04 DIAGNOSIS — M170 Bilateral primary osteoarthritis of knee: Secondary | ICD-10-CM | POA: Diagnosis not present

## 2020-01-04 DIAGNOSIS — M329 Systemic lupus erythematosus, unspecified: Secondary | ICD-10-CM | POA: Diagnosis not present

## 2020-01-04 DIAGNOSIS — K219 Gastro-esophageal reflux disease without esophagitis: Secondary | ICD-10-CM | POA: Diagnosis not present

## 2020-01-04 DIAGNOSIS — Z20822 Contact with and (suspected) exposure to covid-19: Secondary | ICD-10-CM | POA: Diagnosis present

## 2020-01-04 DIAGNOSIS — N3281 Overactive bladder: Secondary | ICD-10-CM | POA: Diagnosis not present

## 2020-01-04 DIAGNOSIS — R32 Unspecified urinary incontinence: Secondary | ICD-10-CM | POA: Diagnosis present

## 2020-01-04 DIAGNOSIS — Z96652 Presence of left artificial knee joint: Secondary | ICD-10-CM

## 2020-01-04 DIAGNOSIS — I739 Peripheral vascular disease, unspecified: Secondary | ICD-10-CM | POA: Diagnosis not present

## 2020-01-04 HISTORY — PX: TOTAL KNEE ARTHROPLASTY: SHX125

## 2020-01-04 LAB — TYPE AND SCREEN
ABO/RH(D): A POS
Antibody Screen: NEGATIVE

## 2020-01-04 LAB — ABO/RH: ABO/RH(D): A POS

## 2020-01-04 SURGERY — ARTHROPLASTY, KNEE, TOTAL
Anesthesia: Spinal | Site: Knee | Laterality: Left

## 2020-01-04 MED ORDER — CEFAZOLIN SODIUM-DEXTROSE 1-4 GM/50ML-% IV SOLN
1.0000 g | Freq: Four times a day (QID) | INTRAVENOUS | Status: AC
  Administered 2020-01-04 (×2): 1 g via INTRAVENOUS
  Filled 2020-01-04 (×2): qty 50

## 2020-01-04 MED ORDER — DEXAMETHASONE SODIUM PHOSPHATE 10 MG/ML IJ SOLN
INTRAMUSCULAR | Status: AC
  Filled 2020-01-04: qty 1

## 2020-01-04 MED ORDER — ORAL CARE MOUTH RINSE: 15.0000 mL | Freq: Once | OROMUCOSAL | Status: AC

## 2020-01-04 MED ORDER — PHENYLEPHRINE HCL (PRESSORS) 10 MG/ML IV SOLN
INTRAVENOUS | Status: DC | PRN
  Administered 2020-01-04: 120 ug via INTRAVENOUS
  Administered 2020-01-04: 80 ug via INTRAVENOUS

## 2020-01-04 MED ORDER — BUPIVACAINE HCL (PF) 0.25 % IJ SOLN
INTRAMUSCULAR | Status: AC
  Filled 2020-01-04: qty 30

## 2020-01-04 MED ORDER — GLYCOPYRROLATE PF 0.2 MG/ML IJ SOSY
PREFILLED_SYRINGE | INTRAMUSCULAR | Status: AC
  Filled 2020-01-04: qty 1

## 2020-01-04 MED ORDER — OXYCODONE HCL 5 MG PO TABS
5.0000 mg | ORAL_TABLET | ORAL | 0 refills | Status: DC | PRN
Start: 2020-01-04 — End: 2020-01-07

## 2020-01-04 MED ORDER — ONDANSETRON HCL 4 MG/2ML IJ SOLN
INTRAMUSCULAR | Status: AC
  Filled 2020-01-04: qty 2

## 2020-01-04 MED ORDER — ROPIVACAINE HCL 7.5 MG/ML IJ SOLN
INTRAMUSCULAR | Status: DC | PRN
  Administered 2020-01-04: 20 mL via PERINEURAL

## 2020-01-04 MED ORDER — GABAPENTIN 300 MG PO CAPS
300.0000 mg | ORAL_CAPSULE | Freq: Every day | ORAL | Status: DC
  Administered 2020-01-05 – 2020-01-07 (×3): 300 mg via ORAL
  Filled 2020-01-04 (×3): qty 1

## 2020-01-04 MED ORDER — ASPIRIN 81 MG PO CHEW
81.0000 mg | CHEWABLE_TABLET | Freq: Two times a day (BID) | ORAL | 0 refills | Status: DC
Start: 2020-01-05 — End: 2020-01-07

## 2020-01-04 MED ORDER — ASPIRIN 81 MG PO CHEW
81.0000 mg | CHEWABLE_TABLET | Freq: Two times a day (BID) | ORAL | Status: DC
  Administered 2020-01-04 – 2020-01-07 (×6): 81 mg via ORAL
  Filled 2020-01-04 (×6): qty 1

## 2020-01-04 MED ORDER — DEXAMETHASONE SODIUM PHOSPHATE 10 MG/ML IJ SOLN
INTRAMUSCULAR | Status: DC | PRN
  Administered 2020-01-04: 5 mg via INTRAVENOUS

## 2020-01-04 MED ORDER — PHENYLEPHRINE HCL (PRESSORS) 10 MG/ML IV SOLN
INTRAVENOUS | Status: AC
  Filled 2020-01-04: qty 1

## 2020-01-04 MED ORDER — METOCLOPRAMIDE HCL 5 MG/ML IJ SOLN: 5.0000 mg | Freq: Three times a day (TID) | INTRAMUSCULAR | Status: DC | PRN

## 2020-01-04 MED ORDER — SPIRONOLACTONE 25 MG PO TABS
25.0000 mg | ORAL_TABLET | Freq: Every day | ORAL | Status: DC
  Administered 2020-01-05 – 2020-01-06 (×2): 25 mg via ORAL
  Filled 2020-01-04 (×3): qty 1

## 2020-01-04 MED ORDER — LACTATED RINGERS IV SOLN: INTRAVENOUS | Status: DC

## 2020-01-04 MED ORDER — BUPIVACAINE HCL (PF) 0.25 % IJ SOLN
INTRAMUSCULAR | Status: DC | PRN
  Administered 2020-01-04: 30 mL

## 2020-01-04 MED ORDER — FENTANYL CITRATE (PF) 100 MCG/2ML IJ SOLN: 25.0000 ug | INTRAMUSCULAR | Status: DC | PRN

## 2020-01-04 MED ORDER — ACETAMINOPHEN 500 MG PO TABS: 1000.0000 mg | ORAL_TABLET | Freq: Once | ORAL | Status: AC

## 2020-01-04 MED ORDER — SODIUM CHLORIDE 0.9 % IV SOLN: INTRAVENOUS | Status: DC

## 2020-01-04 MED ORDER — PROPOFOL 1000 MG/100ML IV EMUL
INTRAVENOUS | Status: AC
  Filled 2020-01-04: qty 100

## 2020-01-04 MED ORDER — ALUM & MAG HYDROXIDE-SIMETH 200-200-20 MG/5ML PO SUSP: 30.0000 mL | ORAL | Status: DC | PRN

## 2020-01-04 MED ORDER — CLONIDINE HCL (ANALGESIA) 100 MCG/ML EP SOLN
EPIDURAL | Status: DC | PRN
  Administered 2020-01-04: 50 ug

## 2020-01-04 MED ORDER — CHLORHEXIDINE GLUCONATE 0.12 % MT SOLN
15.0000 mL | Freq: Once | OROMUCOSAL | Status: AC
  Administered 2020-01-04: 15 mL via OROMUCOSAL

## 2020-01-04 MED ORDER — ATORVASTATIN CALCIUM 40 MG PO TABS
40.0000 mg | ORAL_TABLET | Freq: Every day | ORAL | Status: DC
  Administered 2020-01-04 – 2020-01-06 (×3): 40 mg via ORAL
  Filled 2020-01-04 (×3): qty 1
  Filled 2020-01-04: qty 2

## 2020-01-04 MED ORDER — METHOCARBAMOL 500 MG IVPB - SIMPLE MED
500.0000 mg | Freq: Four times a day (QID) | INTRAVENOUS | Status: DC | PRN
  Filled 2020-01-04: qty 50

## 2020-01-04 MED ORDER — POVIDONE-IODINE 10 % EX SWAB
2.0000 "application " | Freq: Once | CUTANEOUS | Status: AC
  Administered 2020-01-04: 2 via TOPICAL

## 2020-01-04 MED ORDER — FENTANYL CITRATE (PF) 100 MCG/2ML IJ SOLN
INTRAMUSCULAR | Status: AC
  Filled 2020-01-04: qty 2

## 2020-01-04 MED ORDER — DOCUSATE SODIUM 100 MG PO CAPS
100.0000 mg | ORAL_CAPSULE | Freq: Two times a day (BID) | ORAL | Status: DC
  Administered 2020-01-04 – 2020-01-07 (×6): 100 mg via ORAL
  Filled 2020-01-04 (×6): qty 1

## 2020-01-04 MED ORDER — POTASSIUM CHLORIDE ER 10 MEQ PO TBCR
10.0000 meq | EXTENDED_RELEASE_TABLET | Freq: Every day | ORAL | Status: DC
  Administered 2020-01-04 – 2020-01-07 (×4): 10 meq via ORAL
  Filled 2020-01-04 (×7): qty 1

## 2020-01-04 MED ORDER — MIDAZOLAM HCL 5 MG/5ML IJ SOLN
INTRAMUSCULAR | Status: DC | PRN
  Administered 2020-01-04: 2 mg via INTRAVENOUS

## 2020-01-04 MED ORDER — BUPIVACAINE IN DEXTROSE 0.75-8.25 % IT SOLN
INTRATHECAL | Status: DC | PRN
  Administered 2020-01-04: 1.6 mL via INTRATHECAL

## 2020-01-04 MED ORDER — ALLOPURINOL 100 MG PO TABS
100.0000 mg | ORAL_TABLET | Freq: Every day | ORAL | Status: DC
  Administered 2020-01-05 – 2020-01-07 (×3): 100 mg via ORAL
  Filled 2020-01-04 (×3): qty 1

## 2020-01-04 MED ORDER — ONDANSETRON HCL 4 MG/2ML IJ SOLN: 4.0000 mg | Freq: Four times a day (QID) | INTRAMUSCULAR | Status: DC | PRN

## 2020-01-04 MED ORDER — ONDANSETRON HCL 4 MG PO TABS: 4.0000 mg | ORAL_TABLET | Freq: Four times a day (QID) | ORAL | Status: DC | PRN

## 2020-01-04 MED ORDER — ONDANSETRON HCL 4 MG/2ML IJ SOLN
INTRAMUSCULAR | Status: DC | PRN
  Administered 2020-01-04: 4 mg via INTRAVENOUS

## 2020-01-04 MED ORDER — METHOCARBAMOL 500 MG PO TABS
500.0000 mg | ORAL_TABLET | Freq: Four times a day (QID) | ORAL | Status: DC | PRN
  Administered 2020-01-04 – 2020-01-06 (×5): 500 mg via ORAL
  Filled 2020-01-04 (×5): qty 1

## 2020-01-04 MED ORDER — ONDANSETRON HCL 4 MG/2ML IJ SOLN: 4.0000 mg | Freq: Once | INTRAMUSCULAR | Status: DC | PRN

## 2020-01-04 MED ORDER — MENTHOL 3 MG MT LOZG: 1.0000 | LOZENGE | OROMUCOSAL | Status: DC | PRN

## 2020-01-04 MED ORDER — PHENOL 1.4 % MT LIQD: 1.0000 | OROMUCOSAL | Status: DC | PRN

## 2020-01-04 MED ORDER — FENTANYL CITRATE (PF) 100 MCG/2ML IJ SOLN
INTRAMUSCULAR | Status: DC | PRN
  Administered 2020-01-04: 50 ug via INTRAVENOUS

## 2020-01-04 MED ORDER — SODIUM CHLORIDE 0.9 % IR SOLN
Status: DC | PRN
  Administered 2020-01-04: 1000 mL

## 2020-01-04 MED ORDER — FUROSEMIDE 40 MG PO TABS
40.0000 mg | ORAL_TABLET | Freq: Every day | ORAL | Status: DC
  Administered 2020-01-05 – 2020-01-06 (×2): 40 mg via ORAL
  Filled 2020-01-04 (×3): qty 1

## 2020-01-04 MED ORDER — HYDROMORPHONE HCL 1 MG/ML IJ SOLN
0.5000 mg | INTRAMUSCULAR | Status: DC | PRN
  Administered 2020-01-05: 0.5 mg via INTRAVENOUS
  Administered 2020-01-06 (×2): 1 mg via INTRAVENOUS
  Filled 2020-01-04 (×3): qty 1

## 2020-01-04 MED ORDER — OXYCODONE HCL 5 MG PO TABS
5.0000 mg | ORAL_TABLET | ORAL | Status: DC | PRN
  Administered 2020-01-05: 10 mg via ORAL
  Administered 2020-01-06 – 2020-01-07 (×3): 5 mg via ORAL
  Filled 2020-01-04: qty 1
  Filled 2020-01-04 (×2): qty 2
  Filled 2020-01-04 (×2): qty 1

## 2020-01-04 MED ORDER — ZOLPIDEM TARTRATE 5 MG PO TABS: 5.0000 mg | ORAL_TABLET | Freq: Every evening | ORAL | Status: DC | PRN

## 2020-01-04 MED ORDER — METHOCARBAMOL 500 MG PO TABS: 500.0000 mg | ORAL_TABLET | Freq: Four times a day (QID) | ORAL | 1 refills | Status: DC | PRN

## 2020-01-04 MED ORDER — DIPHENHYDRAMINE HCL 12.5 MG/5ML PO ELIX: 12.5000 mg | ORAL_SOLUTION | ORAL | Status: DC | PRN

## 2020-01-04 MED ORDER — BENAZEPRIL HCL 20 MG PO TABS
40.0000 mg | ORAL_TABLET | Freq: Every day | ORAL | Status: DC
  Administered 2020-01-05 – 2020-01-06 (×2): 40 mg via ORAL
  Filled 2020-01-04 (×3): qty 2

## 2020-01-04 MED ORDER — CEFAZOLIN SODIUM-DEXTROSE 2-4 GM/100ML-% IV SOLN
2.0000 g | INTRAVENOUS | Status: AC
  Administered 2020-01-04: 2 g via INTRAVENOUS
  Filled 2020-01-04: qty 100

## 2020-01-04 MED ORDER — ACETAMINOPHEN 500 MG PO TABS
ORAL_TABLET | ORAL | Status: AC
  Administered 2020-01-04: 1000 mg via ORAL
  Filled 2020-01-04: qty 2

## 2020-01-04 MED ORDER — ALBUTEROL SULFATE HFA 108 (90 BASE) MCG/ACT IN AERS: 2.0000 | INHALATION_SPRAY | Freq: Four times a day (QID) | RESPIRATORY_TRACT | Status: DC | PRN

## 2020-01-04 MED ORDER — ATENOLOL 25 MG PO TABS
25.0000 mg | ORAL_TABLET | Freq: Every day | ORAL | Status: DC
  Administered 2020-01-06 – 2020-01-07 (×2): 25 mg via ORAL
  Filled 2020-01-04 (×3): qty 1

## 2020-01-04 MED ORDER — OXYCODONE HCL 5 MG PO TABS
10.0000 mg | ORAL_TABLET | ORAL | Status: DC | PRN
  Administered 2020-01-04: 15 mg via ORAL
  Administered 2020-01-04: 10 mg via ORAL
  Administered 2020-01-05 (×2): 15 mg via ORAL
  Administered 2020-01-06: 10 mg via ORAL
  Administered 2020-01-06: 15 mg via ORAL
  Filled 2020-01-04 (×2): qty 3
  Filled 2020-01-04: qty 2
  Filled 2020-01-04 (×2): qty 3

## 2020-01-04 MED ORDER — GLYCOPYRROLATE PF 0.2 MG/ML IJ SOSY
PREFILLED_SYRINGE | INTRAMUSCULAR | Status: DC | PRN
  Administered 2020-01-04: .2 mg via INTRAVENOUS

## 2020-01-04 MED ORDER — TRANEXAMIC ACID-NACL 1000-0.7 MG/100ML-% IV SOLN
1000.0000 mg | INTRAVENOUS | Status: AC
  Administered 2020-01-04: 1000 mg via INTRAVENOUS
  Filled 2020-01-04: qty 100

## 2020-01-04 MED ORDER — MIDAZOLAM HCL 2 MG/2ML IJ SOLN
INTRAMUSCULAR | Status: AC
  Filled 2020-01-04: qty 2

## 2020-01-04 MED ORDER — PHENYLEPHRINE HCL-NACL 10-0.9 MG/250ML-% IV SOLN
INTRAVENOUS | Status: DC | PRN
  Administered 2020-01-04: 50 ug/min via INTRAVENOUS

## 2020-01-04 MED ORDER — ACETAMINOPHEN 325 MG PO TABS
325.0000 mg | ORAL_TABLET | Freq: Four times a day (QID) | ORAL | Status: DC | PRN
  Administered 2020-01-06: 650 mg via ORAL
  Filled 2020-01-04: qty 2

## 2020-01-04 MED ORDER — PROPOFOL 500 MG/50ML IV EMUL
INTRAVENOUS | Status: DC | PRN
  Administered 2020-01-04: 50 ug/kg/min via INTRAVENOUS

## 2020-01-04 MED ORDER — PANTOPRAZOLE SODIUM 40 MG PO TBEC
40.0000 mg | DELAYED_RELEASE_TABLET | Freq: Every day | ORAL | Status: DC
  Administered 2020-01-04 – 2020-01-07 (×4): 40 mg via ORAL
  Filled 2020-01-04 (×4): qty 1

## 2020-01-04 MED ORDER — 0.9 % SODIUM CHLORIDE (POUR BTL) OPTIME
TOPICAL | Status: DC | PRN
  Administered 2020-01-04: 1000 mL

## 2020-01-04 MED ORDER — METOCLOPRAMIDE HCL 5 MG PO TABS: 5.0000 mg | ORAL_TABLET | Freq: Three times a day (TID) | ORAL | Status: DC | PRN

## 2020-01-04 MED ORDER — AMLODIPINE BESYLATE 10 MG PO TABS
10.0000 mg | ORAL_TABLET | Freq: Every day | ORAL | Status: DC
  Administered 2020-01-05 – 2020-01-06 (×2): 10 mg via ORAL
  Filled 2020-01-04 (×3): qty 1

## 2020-01-04 SURGICAL SUPPLY — 60 items
APL SKNCLS STERI-STRIP NONHPOA (GAUZE/BANDAGES/DRESSINGS)
BAG SPEC THK2 15X12 ZIP CLS (MISCELLANEOUS)
BAG ZIPLOCK 12X15 (MISCELLANEOUS) IMPLANT
BASEPLATE TIBIAL TRIATH (Orthopedic Implant) ×2 IMPLANT
BEARIN INSERT TIBIAL 13 (Orthopedic Implant) ×2 IMPLANT
BEARIN INSERT TIBIAL 13MM (Orthopedic Implant) ×1 IMPLANT
BEARING INSERT TIBIAL 13 (Orthopedic Implant) IMPLANT
BENZOIN TINCTURE PRP APPL 2/3 (GAUZE/BANDAGES/DRESSINGS) IMPLANT
BLADE SAG 18X100X1.27 (BLADE) IMPLANT
BLADE SURG SZ10 CARB STEEL (BLADE) ×6 IMPLANT
BNDG ELASTIC 6X5.8 VLCR STR LF (GAUZE/BANDAGES/DRESSINGS) ×5 IMPLANT
BOWL SMART MIX CTS (DISPOSABLE) IMPLANT
BSPLAT TIB 4 UNV CMNT TL STAB (Orthopedic Implant) ×1 IMPLANT
CEMENT BONE SIMPLEX SPEEDSET (Cement) ×4 IMPLANT
CLOSURE WOUND 1/2 X4 (GAUZE/BANDAGES/DRESSINGS)
COVER SURGICAL LIGHT HANDLE (MISCELLANEOUS) ×3 IMPLANT
COVER WAND RF STERILE (DRAPES) IMPLANT
CUFF TOURN SGL QUICK 34 (TOURNIQUET CUFF) ×3
CUFF TRNQT CYL 34X4.125X (TOURNIQUET CUFF) ×1 IMPLANT
DECANTER SPIKE VIAL GLASS SM (MISCELLANEOUS) IMPLANT
DRAPE U-SHAPE 47X51 STRL (DRAPES) ×3 IMPLANT
DRSG PAD ABDOMINAL 8X10 ST (GAUZE/BANDAGES/DRESSINGS) ×4 IMPLANT
DURAPREP 26ML APPLICATOR (WOUND CARE) ×3 IMPLANT
ELECT BLADE TIP CTD 4 INCH (ELECTRODE) ×3 IMPLANT
ELECT REM PT RETURN 15FT ADLT (MISCELLANEOUS) ×3 IMPLANT
FEMORAL PEG DISTAL FIXATION (Orthopedic Implant) ×2 IMPLANT
FEMORAL TRIATH POST STAB  SZ3 (Orthopedic Implant) ×3 IMPLANT
FEMORAL TRIATH POST STAB SZ3 (Orthopedic Implant) IMPLANT
GAUZE SPONGE 4X4 12PLY STRL (GAUZE/BANDAGES/DRESSINGS) ×3 IMPLANT
GAUZE XEROFORM 1X8 LF (GAUZE/BANDAGES/DRESSINGS) ×2 IMPLANT
GLOVE BIO SURGEON STRL SZ7.5 (GLOVE) ×3 IMPLANT
GLOVE BIOGEL PI IND STRL 8 (GLOVE) ×2 IMPLANT
GLOVE BIOGEL PI INDICATOR 8 (GLOVE) ×4
GLOVE ECLIPSE 8.0 STRL XLNG CF (GLOVE) ×3 IMPLANT
GOWN STRL REUS W/TWL XL LVL3 (GOWN DISPOSABLE) ×6 IMPLANT
HANDPIECE INTERPULSE COAX TIP (DISPOSABLE) ×3
HOLDER FOLEY CATH W/STRAP (MISCELLANEOUS) IMPLANT
IMMOBILIZER KNEE 20 (SOFTGOODS) ×3
IMMOBILIZER KNEE 20 THIGH 36 (SOFTGOODS) ×1 IMPLANT
KIT TURNOVER KIT A (KITS) IMPLANT
NS IRRIG 1000ML POUR BTL (IV SOLUTION) ×3 IMPLANT
PACK TOTAL KNEE CUSTOM (KITS) ×3 IMPLANT
PADDING CAST COTTON 6X4 STRL (CAST SUPPLIES) ×6 IMPLANT
PATELLA TRIATHLON SZ 29 9 MM (Orthopedic Implant) ×2 IMPLANT
PENCIL SMOKE EVACUATOR (MISCELLANEOUS) IMPLANT
PIN FLUTED HEDLESS FIX 3.5X1/8 (PIN) ×2 IMPLANT
PROTECTOR NERVE ULNAR (MISCELLANEOUS) ×3 IMPLANT
SET HNDPC FAN SPRY TIP SCT (DISPOSABLE) ×1 IMPLANT
SET PAD KNEE POSITIONER (MISCELLANEOUS) ×3 IMPLANT
STAPLER VISISTAT 35W (STAPLE) IMPLANT
STRIP CLOSURE SKIN 1/2X4 (GAUZE/BANDAGES/DRESSINGS) IMPLANT
SUT MNCRL AB 4-0 PS2 18 (SUTURE) IMPLANT
SUT VIC AB 0 CT1 27 (SUTURE) ×3
SUT VIC AB 0 CT1 27XBRD ANTBC (SUTURE) ×1 IMPLANT
SUT VIC AB 1 CT1 36 (SUTURE) ×6 IMPLANT
SUT VIC AB 2-0 CT1 27 (SUTURE) ×9
SUT VIC AB 2-0 CT1 TAPERPNT 27 (SUTURE) ×2 IMPLANT
TRAY FOLEY MTR SLVR 16FR STAT (SET/KITS/TRAYS/PACK) ×3 IMPLANT
WATER STERILE IRR 1000ML POUR (IV SOLUTION) ×3 IMPLANT
WRAP KNEE MAXI GEL POST OP (GAUZE/BANDAGES/DRESSINGS) ×2 IMPLANT

## 2020-01-04 NOTE — Transfer of Care (Signed)
Immediate Anesthesia Transfer of Care Note  Patient: Crystal Patterson  Procedure(s) Performed: LEFT TOTAL KNEE ARTHROPLASTY (Left Knee)  Patient Location: PACU  Anesthesia Type:Spinal  Level of Consciousness: awake, alert  and oriented  Airway & Oxygen Therapy: Patient Spontanous Breathing and Patient connected to face mask  Post-op Assessment: Report given to RN and Post -op Vital signs reviewed and stable  Post vital signs: Reviewed and stable  Last Vitals:  Vitals Value Taken Time  BP 95/69 01/04/20 0910  Temp    Pulse 53 01/04/20 0913  Resp 10 01/04/20 0913  SpO2 100 % 01/04/20 0913  Vitals shown include unvalidated device data.  Last Pain:  Vitals:   01/04/20 0602  TempSrc: Oral      Patients Stated Pain Goal: 5 (01/04/20 0557)  Complications: No complications documented.

## 2020-01-04 NOTE — Evaluation (Signed)
Physical Therapy Evaluation Patient Details Name: Crystal Patterson MRN: [id number] DOB: 1965-04-17 Today's Date: 01/04/2020   History of Present Illness  Patient is 54 y.o. female s/p Lt TKA on 01/04/20 with PMH significant for OA, HTN, Lupus.   Clinical Impression  Crystal Patterson is a 54 y.o. female POD 0 s/p Lt TKA. Patient reports independence with RW/SPC for mobility at baseline. Patient is now limited by functional impairments (see PT problem list below) and requires min assist for transfers and gait with RW. Patient was able to ambulate ~70 feet with RW and min assist. Patient instructed in exercise to facilitate ROM and circulation. Patient will benefit from continued skilled PT interventions to address impairments and progress towards PLOF. Acute PT will follow to progress mobility and stair training in preparation for safe discharge home.       01/04/20 1200  PT Visit Information  Last PT Received On 01/04/20  Assistance Needed +1  History of Present Illness Patient is 54 y.o. female s/p Lt TKA on 01/04/20 with PMH significant for OA, HTN, Lupus.  Precautions  Precautions Fall  Restrictions  Weight Bearing Restrictions No  Other Position/Activity Restrictions WBAT  Home Living  Family/patient expects to be discharged to: Private residence  Living Arrangements Alone  Available Help at Discharge Family (daughter)  Type of Home House  Home Access Stairs to enter  Entrance Stairs-Number of Steps 2  Entrance Stairs-Rails None  Home Layout One level  Bathroom Nurse, children's Yes  Home Equipment [place] - 2 wheels;Walker - 4 wheels;Shower seat;Toilet riser;Cane - single point  Prior Function  Level of Independence Independent with assistive device(s)  Gait / Transfers Assistance Needed pt reports using RW and SPC for mobility at baseline  ADL's / Homemaking Assistance Needed reports independence with ADL's.  Communication   Communication No difficulties  Pain Assessment  Pain Assessment Faces  Faces Pain Scale 4  Pain Location Lt knee  Pain Descriptors / Indicators Aching;Discomfort  Pain Intervention(s) Limited activity within patient's tolerance;Monitored during session;Repositioned  Cognition  Arousal/Alertness Awake/alert  Behavior During Therapy WFL for tasks assessed/performed  Overall Cognitive Status Within Functional Limits for tasks assessed  Upper Extremity Assessment  Upper Extremity Assessment Overall WFL for tasks assessed  Lower Extremity Assessment  Lower Extremity Assessment LLE deficits/detail  LLE Deficits / Details good quad activation, no extensor lag with SLR  LLE Sensation WNL  LLE Coordination WNL  Cervical / Trunk Assessment  Cervical / Trunk Assessment Normal  Bed Mobility  Overal bed mobility Needs Assistance  Bed Mobility Supine to Sit  Supine to sit Min assist  General bed mobility comments cues to use bed rail, assist needed for Lt LE and trunk.   Transfers  Overall transfer level Needs assistance  Equipment used Rolling walker (2 wheeled)  Transfers Sit to/from Stand  Sit to Stand Min assist  General transfer comment patient required min assist for power up from EOB and to steady in standing.  Ambulation/Gait  Ambulation/Gait assistance Min assist  Gait Distance (Feet) 70 Feet  Assistive device Rolling walker (2 wheeled)  Gait Pattern/deviations Step-to pattern;Decreased stride length;Decreased weight shift to left  General Gait Details cues for safe proximity to RW and for safe step pattern. Pt mildly unsteady throughout requiring assist to maintain balancce.  Gait velocity decr  Exercises  Exercises Total Joint  Total Joint Exercises  Ankle Circles/Pumps AROM;Both;20 reps;Seated  PT - End of Session  Equipment Utilized  During Treatment Gait belt  Activity Tolerance Patient tolerated treatment well  Patient left in chair;with call bell/phone within  reach;with chair alarm set  Nurse Communication Mobility status  PT Assessment  PT Recommendation/Assessment Patient needs continued PT services  PT Visit Diagnosis Muscle weakness (generalized) (M62.81);Difficulty in walking, not elsewhere classified (R26.2)  PT Problem List Decreased strength;Decreased range of motion;Decreased activity tolerance;Decreased balance;Decreased mobility;Decreased knowledge of use of DME;Decreased knowledge of precautions  PT Plan  PT Frequency (ACUTE ONLY) 7X/week  PT Treatment/Interventions (ACUTE ONLY) DME instruction;Gait training;Stair training;Functional mobility training;Therapeutic activities;Therapeutic exercise;Balance training;Patient/family education  AM-PAC PT "6 Clicks" Mobility Outcome Measure (Version 2)  Help needed turning from your back to your side while in a flat bed without using bedrails? 4  Help needed moving from lying on your back to sitting on the side of a flat bed without using bedrails? 3  Help needed moving to and from a bed to a chair (including a wheelchair)? 3  Help needed standing up from a chair using your arms (e.g., wheelchair or bedside chair)? 3  Help needed to walk in hospital room? 3  Help needed climbing 3-5 steps with a railing?  2  6 Click Score 18  Consider Recommendation of Discharge To: Home with [facility]  PT Recommendation  Follow Up Recommendations Follow surgeon's recommendation for DC plan and follow-up therapies  PT equipment None recommended by PT  Individuals Consulted  Consulted and Agree with Results and Recommendations Patient  Acute Rehab PT Goals  Patient Stated Goal be able to walk with less pain  PT Goal Formulation With patient  Time For Goal Achievement 01/11/20  Potential to Achieve Goals Good  PT Time Calculation  PT Start Time (ACUTE ONLY) 1227  PT Stop Time (ACUTE ONLY) 1250  PT Time Calculation (min) (ACUTE ONLY) 23 min  PT General Charges  $$ ACUTE PT VISIT 1 Visit  PT Evaluation  $PT  Eval Low Complexity 1 Low  PT Treatments  $Gait Training 8-22 mins  Written Expression  Dominant Hand Right    Wynn Maudlin, DPT Acute Rehabilitation Services  Office [phone] Pager [phone]  01/04/2020 5:52 PM

## 2020-01-04 NOTE — Brief Op Note (Signed)
01/04/2020  8:43 AM  PATIENT:  [name]  54 y.o. female  PRE-OPERATIVE DIAGNOSIS:  Osteoarthritis Left Knee  POST-OPERATIVE DIAGNOSIS:  Osteoarthritis Left Knee  PROCEDURE:  Procedure(s): LEFT TOTAL KNEE ARTHROPLASTY (Left)  SURGEON:  Surgeon(s) and Role:    [name], MD - Primary  PHYSICIAN ASSISTANT: Mikey Kirschner, PA-C  ANESTHESIA:   local, regional and spinal  EBL:  50 mL   COUNTS:  YES  TOURNIQUET:   Total Tourniquet Time Documented: Thigh (Left) - 48 minutes Total: Thigh (Left) - 48 minutes   DICTATION: .Other Dictation: Dictation Number [id number]  PLAN OF CARE: Admit for overnight observation  PATIENT DISPOSITION:  PACU - hemodynamically stable.   Delay start of Pharmacological VTE agent (>24hrs) due to surgical blood loss or risk of bleeding: no

## 2020-01-04 NOTE — Op Note (Signed)
NAME: Crystal Patterson, TETRO MEDICAL RECORD [id number] ACCOUNT [id number] DATE OF BIRTH:30-Dec-1965 FACILITY: [facility] LOCATION: [id number] PHYSICIAN:Mirai Greenwood Aretha Parrot, MD  OPERATIVE REPORT  DATE OF PROCEDURE:  01/04/2020  PREOPERATIVE DIAGNOSIS:  Severe end-stage arthritis and degenerative joint disease, left knee with varus instability and subluxation.  POSTOPERATIVE DIAGNOSIS:  Severe end-stage arthritis and degenerative joint disease, left knee with varus instability and subluxation.  PROCEDURE:  Left total knee arthroplasty.  IMPLANTS:  Stryker Triathlon cemented knee system with size 3 femur, size 4 universal tibial baseplate, size 13 mm thickness, fixed bearing polyethylene liner, size 29 patellar button.  SURGEON:  Doneen Poisson, MD  ASSISTANT:  Hal Hope, PA-C.  ANESTHESIA: 1.  Left lower extremity adductor canal block. 2.  General. 3.  Local with 0.25% plain Marcaine.  TOURNIQUET TIME:  Less than 1 hour.  ESTIMATED BLOOD LOSS:  Less than 100 mL.  COMPLICATIONS:  None.  INDICATIONS:  The patient is a 54 year old female with windswept knees and debilitating arthritis in both of these knees.  The right knee has valgus malalignment and the left knee has varus malalignment.  At this point, with very conservative treatment,  we have recommended a knee arthroplasty.  We are going to proceed with left knee today.  We had a long and thorough discussion about the surgery including the risk of acute blood loss anemia, knee instability, nerve or vessel injury, fracture, infection,  implant failure and DVT.  We talked about our goals of being decreased pain, improved mobility and overall improved quality of life.  DESCRIPTION OF PROCEDURE:  After informed consent was obtained, appropriate left knee was marked.  Anesthesia was obtained, an adductor canal block in the holding room.  She was then brought to the operating room and sat up on the operating table.   Spinal  anesthesia was obtained.  She was laid in supine position on the operating table.  Foley catheter was placed and nonsterile tourniquet was placed around her upper left thigh.  Her left thigh, knee, leg, ankle and foot were prepped and draped with  DuraPrep and sterile drapes including a sterile stockinette.  Time-out was called and she was identified as correct patient, correct left knee.  We then used an Esmarch to wrap that leg and tourniquet was inflated to 300 mm of pressure.  I then made a  direct midline incision over the patella and carried this proximally and distally, dissected down the knee joint and carried out a medial parapatellar arthrotomy and finding significant loose bodies and periarticular osteophytes throughout the knee.   There was essentially no cartilage in the knee at all with completely denuded.  There was evidence of previous multiple steroid placements in the knee and a large joint effusion.  With the knee in a flexed position, we removed remnants of ACL, PCL medial  and lateral meniscus.  Using the extramedullary cutting guide, we set our proximal tibia cut for taking 9 mm off the high side, correcting for varus and valgus and neutral slope.  We also removed osteophytes from all 3 compartments.  We made the  proximal tibia cut without difficulty and then went to the femur for an intramedullary guide for making our distal femoral cut for a left knee at 5 degrees externally rotated and an 8 mm distal femoral cut was made, this cut without difficulty and  brought the knee back down to full extension and actually she hyperextended with a 9 mm extension block.  We went back to the femur and  put our femoral sizing guide based off the epicondylar axis.  Based off of this, we chose a size 3 femur.  We put a  4-in-1 cutting block for a size 3 femur and made our anterior and posterior cuts, followed by our chamfer cuts.  We then made our femoral box cut.  Attention was then turned back to  the tibia.  We chose a size 4 tibial tray for coverage of the tibia,  setting the rotation off the tibial tubercle and the femur.  We did a keel punch off of this and a drill hole for universal baseplate just in case we needed to perform or place a constrained liner due to potential for instability.  We then made our  patellar cut and drilled 3 holes for a size 29 patellar button.  We then irrigated the knee very well with normal saline solution using pulsatile lavage.  We trialed up to a 13 mm insert with all trial components in place and we felt it was stable and  did not need a constrained insert with varus and valgus stressing.  We then removed all instrumentation from the knee and mixed our cement.  With the knee in a flexed position, we cemented our Stryker Triathlon size 4 universal baseplate followed by our  size 3 left femur.  We removed cement debris from the knee and placed our 13 mm fixed bearing polyethylene insert and cemented our patellar button.  With the knee held in a full extended position, we let the cement cure and we removed any cement debris  from the knee and then once the cement had hardened, we let the tourniquet down and hemostasis was obtained with electrocautery.  We put the knee through several cycles of motion.  We were pleased with stability.  We then closed the arthrotomy with  interrupted #1 Vicryl suture followed by 0 Vicryl to close the deep tissue and 2-0 Vicryl was used to close the subcutaneous tissue and interrupted staples were used to close the skin.  A well-padded sterile dressing was applied.  She was taken to  recovery room in stable condition with all final counts being correct.  No complications noted.  Of note, Hal Hope, PA-C, assisted during the entire case and her assistance was crucial for facilitating all aspects of this case.  HN/NUANCE  D:01/04/2020 T:01/04/2020 JOB:[id number]

## 2020-01-04 NOTE — Anesthesia Procedure Notes (Signed)
Anesthesia Regional Block: Adductor canal block   Pre-Anesthetic Checklist: ,, timeout performed, Correct Patient, Correct Site, Correct Laterality, Correct Procedure, Correct Position, site marked, Risks and benefits discussed,  Surgical consent,  Pre-op evaluation,  At surgeon's request and post-op pain management  Laterality: Left  Prep: chloraprep       Needles:  Injection technique: Single-shot  Needle Type: Echogenic Needle     Needle Length: 9cm  Needle Gauge: 21     Additional Needles:   Procedures:,,,, ultrasound used (permanent image in chart),,,,  Narrative:  Start time: 01/04/2020 6:50 AM End time: 01/04/2020 7:00 AM Injection made incrementally with aspirations every 5 mL.  Performed by: Personally  Anesthesiologist: [name], MD  Additional Notes: No pain on injection. No increased resistance to injection. Injection made in 5cc increments.  Good needle visualization.  Patient tolerated procedure well.

## 2020-01-04 NOTE — Anesthesia Procedure Notes (Signed)
Spinal  Patient location during procedure: OR Start time: 01/04/2020 7:18 AM End time: 01/04/2020 7:26 AM Staffing Performed: anesthesiologist  Anesthesiologist: [name], MD Preanesthetic Checklist Completed: patient identified, IV checked, risks and benefits discussed, surgical consent, monitors and equipment checked, pre-op evaluation and timeout performed Spinal Block Patient position: sitting Prep: DuraPrep and site prepped and draped Patient monitoring: continuous pulse ox and blood pressure Approach: midline Location: L3-4 Injection technique: single-shot Needle Needle type: Pencan  Needle gauge: 24 G Additional Notes Functioning IV was confirmed and monitors were applied. Sterile prep and drape, including hand hygiene, mask and sterile gloves were used. The patient was positioned and the spine was prepped. The skin was anesthetized with lidocaine.  Free flow of clear CSF was obtained prior to injecting local anesthetic into the CSF.  The spinal needle aspirated freely following injection.  The needle was carefully withdrawn.  The patient tolerated the procedure well. Consent was obtained prior to procedure with all questions answered and concerns addressed. Risks including but not limited to bleeding, infection, nerve damage, paralysis, failed block, inadequate analgesia, allergic reaction, high spinal, itching and headache were discussed and the patient wished to proceed.   Attempt x2 by CRNA, unsuccessful. Attempt x1 by MDA with CSF return. Patient with scoliosis.  Arrie Aran, MD

## 2020-01-04 NOTE — Discharge Instructions (Signed)

## 2020-01-04 NOTE — Care Plan (Signed)
Ortho Bundle Case Management Note  Patient Details  Name: Crystal Patterson MRN: [id number] Date of Birth: 1965-04-16  [facility] call to patient to review her upcoming Left total knee arthroplasty with Dr. Magnus Ivan. She is an Ortho bundle patient through [facility] and is agreeable to case management. She has a daughter that will be assisting after discharge. She has a FWW, but will need a 3in1/BSC. Will order through [employer] to be delivered. Anticipate HHPT will be needed after hospital stay. Choice provided and referral made to [facility]. Reviewed all post-op care instructions. Will continue to follow for needs.                        DME Arranged:  3-N-1 (Patient verbalized she has FWW already) DME Agency:  [employer]  HH Arranged:  PT HH Agency:  [facility] (now [facility])  Additional Comments: Please contact me with any questions of if this plan should need to change.  Ralph Dowdy, RN, BSN, [employer]  [phone] 01/04/2020, 10:51 AM

## 2020-01-04 NOTE — Anesthesia Postprocedure Evaluation (Signed)
Anesthesia Post Note  Patient: Crystal Patterson  Procedure(s) Performed: LEFT TOTAL KNEE ARTHROPLASTY (Left Knee)     Patient location during evaluation: PACU Anesthesia Type: Spinal Level of consciousness: oriented, awake and alert and awake Pain management: pain level controlled Vital Signs Assessment: post-procedure vital signs reviewed and stable Respiratory status: spontaneous breathing, respiratory function stable, patient connected to nasal cannula oxygen and nonlabored ventilation Cardiovascular status: blood pressure returned to baseline and stable Postop Assessment: no headache, no backache, no apparent nausea or vomiting and spinal receding Anesthetic complications: no   No complications documented.  Last Vitals:  Vitals:   01/04/20 1030 01/04/20 1055  BP: 98/70 104/74  Pulse: (!) 50 (!) 56  Resp: 14 16  Temp:  36.6 C  SpO2: 97% 98%    Last Pain:  Vitals:   01/04/20 1055  TempSrc: Oral  PainSc: 0-No pain                 [name]

## 2020-01-04 NOTE — Interval H&P Note (Signed)
History and Physical Interval Note: The patient understands that she is here today for a left total knee arthroplasty to treat the pain from the osteoarthritis in that knee.  There has been no interval change in her medical status.  See recent H&P.  The risk and benefits of surgery been described and informed consent is obtained.  The left knee has been marked.  01/04/2020 7:02 AM  Crystal Patterson  has presented today for surgery, with the diagnosis of Osteoarthritis Left Knee.  The various methods of treatment have been discussed with the patient and family. After consideration of risks, benefits and other options for treatment, the patient has consented to  Procedure(s): LEFT TOTAL KNEE ARTHROPLASTY (Left) as a surgical intervention.  The patient's history has been reviewed, patient examined, no change in status, stable for surgery.  I have reviewed the patient's chart and labs.  Questions were answered to the patient's satisfaction.     [name]

## 2020-01-05 LAB — CBC
HCT: 31.1 % — ABNORMAL LOW (ref 36.0–46.0)
Hemoglobin: 9.7 g/dL — ABNORMAL LOW (ref 12.0–15.0)
MCH: 31.3 pg (ref 26.0–34.0)
MCHC: 31.2 g/dL (ref 30.0–36.0)
MCV: 100.3 fL — ABNORMAL HIGH (ref 80.0–100.0)
Platelets: UNDETERMINED 10*3/uL (ref 150–400)
RBC: 3.1 MIL/uL — ABNORMAL LOW (ref 3.87–5.11)
RDW: 13.1 % (ref 11.5–15.5)
WBC: 9.9 10*3/uL (ref 4.0–10.5)
nRBC: 0 % (ref 0.0–0.2)

## 2020-01-05 LAB — BASIC METABOLIC PANEL
Anion gap: 6 (ref 5–15)
BUN: 13 mg/dL (ref 6–20)
CO2: 22 mmol/L (ref 22–32)
Calcium: 8.6 mg/dL — ABNORMAL LOW (ref 8.9–10.3)
Chloride: 107 mmol/L (ref 98–111)
Creatinine, Ser: 0.89 mg/dL (ref 0.44–1.00)
GFR, Estimated: >60 mL/min (ref >60)
Glucose, Bld: 135 mg/dL — ABNORMAL HIGH (ref 70–99)
Potassium: 4.5 mmol/L (ref 3.5–5.1)
Sodium: 135 mmol/L (ref 135–145)

## 2020-01-05 NOTE — Progress Notes (Signed)
Physical Therapy Treatment Patient Details Name: Crystal Patterson MRN: [id number] DOB: [birth year] Today's Date: 01/05/2020    History of Present Illness Patient is 54 y.o. female s/p Lt TKA on 01/04/20 with PMH significant for OA, HTN, Lupus.    PT Comments    Pt a little less groggy this afternoon, however still grogginess present and limiting activity tolerance and safety. Will continue to work with pt safety, mobility, exercises for strengthening and ROM.    Follow Up Recommendations  Follow surgeon's recommendation for DC plan and follow-up therapies     Equipment Recommendations  None recommended by PT    Recommendations for Other Services       Precautions / Restrictions Restrictions Other Position/Activity Restrictions: WBAT    Mobility  Bed Mobility Overal bed mobility: Needs Assistance Bed Mobility: Supine to Sit     Supine to sit: Min guard     General bed mobility comments: pt moved more independently with afternoon with bed mobility  Transfers Overall transfer level: Needs assistance Equipment used: Rolling walker (2 wheeled) Transfers: Sit to/from Stand Sit to Stand: Min guard         General transfer comment: cues for RW safety and transitional safety  Ambulation/Gait Ambulation/Gait assistance: Min assist Gait Distance (Feet): 75 Feet Assistive device: Rolling walker (2 wheeled) Gait Pattern/deviations: Step-to pattern;Decreased stride length;Decreased weight shift to left Gait velocity: decr   General Gait Details: cues for RW safety to help promote step to pattern, however pt continues to do step trhough pattern and grunting with pain. Pt limited mostly by pain and fatigue, barely made it back to the bed.   Stairs             Wheelchair Mobility    Modified Rankin (Stroke Patients Only)       Balance Overall balance assessment: Needs assistance Sitting-balance support: Bilateral upper extremity supported;Feet supported Sitting  balance-Leahy Scale: Fair     Standing balance support: Bilateral upper extremity supported;During functional activity Standing balance-Leahy Scale: Fair                              Cognition Arousal/Alertness: Awake/alert Behavior During Therapy: WFL for tasks assessed/performed Overall Cognitive Status: Within Functional Limits for tasks assessed                                        Exercises Total Joint Exercises Ankle Circles/Pumps: AROM;Both;10 reps Heel Slides: AAROM;Left;10 reps;Supine Hip ABduction/ADduction: AAROM;Left;10 reps;Supine Straight Leg Raises: AAROM;Left;10 reps;Supine Goniometric ROM: 0-90 seated at edge of bed    General Comments        Pertinent Vitals/Pain Pain Assessment: 0-10 Pain Score: 6  Faces Pain Scale: Hurts little more (with walking and moving) Pain Location: Lt knee Pain Descriptors / Indicators: Sore;Aching Pain Intervention(s): Monitored during session;Repositioned    Home Living                      Prior Function            PT Goals (current goals can now be found in the care plan section) Acute Rehab PT Goals Patient Stated Goal: be able to walk with less pain PT Goal Formulation: With patient Time For Goal Achievement: 01/11/20 Potential to Achieve Goals: Good Progress towards PT goals: Progressing toward goals    Frequency  7X/week      PT Plan Current plan remains appropriate    Co-evaluation              AM-PAC PT "6 Clicks" Mobility   Outcome Measure  Help needed turning from your back to your side while in a flat bed without using bedrails?: None Help needed moving from lying on your back to sitting on the side of a flat bed without using bedrails?: A Little Help needed moving to and from a bed to a chair (including a wheelchair)?: A Little Help needed standing up from a chair using your arms (e.g., wheelchair or bedside chair)?: A Little Help needed to walk  in hospital room?: A Little Help needed climbing 3-5 steps with a railing? : A Lot 6 Click Score: 18    End of Session Equipment Utilized During Treatment: Gait belt Activity Tolerance: Patient tolerated treatment well Patient left: with call bell/phone within reach;in bed;with bed alarm set Nurse Communication: Mobility status PT Visit Diagnosis: Muscle weakness (generalized) (M62.81);Difficulty in walking, not elsewhere classified (R26.2)     Time: 0076-2263 PT Time Calculation (min) (ACUTE ONLY): 38 min  Charges:  $Gait Training: 8-22 mins $Therapeutic Exercise: 8-22 mins                     Crystal Patterson, PT, MPT Acute Rehabilitation Services Office: [phone] Pager: [phone] 01/05/2020    Marella Bile 01/05/2020, 3:03 PM

## 2020-01-05 NOTE — Progress Notes (Signed)
Physical Therapy Treatment Patient Details Name: Crystal Patterson MRN: [id number] DOB: [birth year] Today's Date: 01/05/2020    History of Present Illness Patient is 54 y.o. female s/p Lt TKA on 01/04/20 with PMH significant for OA, HTN, Lupus.    PT Comments    Pt very groggy today possible from pain meds, difficult keeping her eyes open during session. Pt still wanted to try and work with PT , with very little movment pt did have great amounts of pain but worked through it. Decreased tolerance and distance today with walking. A little concern with DC plan, pt stated she lives alone, and dtr may be trying to get a plan for help at home. She will need initial 24/7 S and assistance. Will continue to follow while here in acute.    Follow Up Recommendations  Follow surgeon's recommendation for DC plan and follow-up therapies     Equipment Recommendations  None recommended by PT (pt states she has RW at home)    Recommendations for Other Services       Precautions / Restrictions Restrictions Other Position/Activity Restrictions: WBAT    Mobility  Bed Mobility Overal bed mobility: Needs Assistance Bed Mobility: Supine to Sit     Supine to sit: Min assist     General bed mobility comments: cues to use bed rail, assist needed for Lt LE and trunk.   Transfers Overall transfer level: Needs assistance Equipment used: Rolling walker (2 wheeled)   Sit to Stand: Min assist         General transfer comment: patient required min assist for power up from EOB and to steady in standing.  Ambulation/Gait Ambulation/Gait assistance: Min assist Gait Distance (Feet): 50 Feet Assistive device: Rolling walker (2 wheeled) Gait Pattern/deviations: Step-to pattern;Decreased stride length;Decreased weight shift to left Gait velocity: decr   General Gait Details: cues for safety and to help limit pain in L LE with gait. Pt was groggy today so limited distance had to be followed by recliner and  sit down.   Stairs             Wheelchair Mobility    Modified Rankin (Stroke Patients Only)       Balance Overall balance assessment: Needs assistance Sitting-balance support: Bilateral upper extremity supported;Feet supported Sitting balance-Leahy Scale: Fair     Standing balance support: Bilateral upper extremity supported;During functional activity Standing balance-Leahy Scale: Fair                              Cognition Arousal/Alertness: Awake/alert Behavior During Therapy: WFL for tasks assessed/performed Overall Cognitive Status: Within Functional Limits for tasks assessed                                        Exercises Total Joint Exercises Ankle Circles/Pumps: AROM;Both;10 reps Heel Slides: AAROM;Left;10 reps;Supine Hip ABduction/ADduction: AAROM;Left;10 reps;Supine Straight Leg Raises: AAROM;Left;10 reps;Supine Goniometric ROM: 0-90 seated at edge of bed    General Comments        Pertinent Vitals/Pain Pain Assessment: 0-10 Pain Score: 6  Pain Location: Lt knee Pain Descriptors / Indicators: Aching;Sore Pain Intervention(s): Monitored during session;Premedicated before session;Ice applied    Home Living                      Prior Function  PT Goals (current goals can now be found in the care plan section) Acute Rehab PT Goals Patient Stated Goal: be able to walk with less pain PT Goal Formulation: With patient Time For Goal Achievement: 01/11/20 Potential to Achieve Goals: Good Progress towards PT goals: Progressing toward goals    Frequency    7X/week      PT Plan Current plan remains appropriate    Co-evaluation              AM-PAC PT "6 Clicks" Mobility   Outcome Measure  Help needed turning from your back to your side while in a flat bed without using bedrails?: None Help needed moving from lying on your back to sitting on the side of a flat bed without using  bedrails?: A Little Help needed moving to and from a bed to a chair (including a wheelchair)?: A Little Help needed standing up from a chair using your arms (e.g., wheelchair or bedside chair)?: A Little Help needed to walk in hospital room?: A Little Help needed climbing 3-5 steps with a railing? : A Lot 6 Click Score: 18    End of Session Equipment Utilized During Treatment: Gait belt Activity Tolerance: Patient limited by lethargy Patient left: in chair;with call bell/phone within reach;with chair alarm set;with family/visitor present Nurse Communication: Mobility status PT Visit Diagnosis: Muscle weakness (generalized) (M62.81);Difficulty in walking, not elsewhere classified (R26.2)     Time: 1110-1140 PT Time Calculation (min) (ACUTE ONLY): 30 min  Charges:  $Gait Training: 8-22 mins $Therapeutic Exercise: 8-22 mins                     Cambre Matson, PT, MPT Acute Rehabilitation Services Office: [phone] Pager: [phone] 01/05/2020    Marella Bile 01/05/2020, 1:43 PM

## 2020-01-05 NOTE — Progress Notes (Signed)
   Subjective: 1 Day Post-Op Procedure(s) (LRB): LEFT TOTAL KNEE ARTHROPLASTY (Left) Patient reports pain as moderate.  Incontinent , sitting in urine soaked sheet. Dressing changed.   Objective: Vital signs in last 24 hours: Temp:  [97.5 F (36.4 C)-98.2 F (36.8 C)] 98.1 F (36.7 C) (11/06 0943) Pulse Rate:  [50-62] 53 (11/06 0943) Resp:  [14-17] 17 (11/06 0943) BP: (98-126)/(70-85) 105/73 (11/06 0943) SpO2:  [97 %-100 %] 97 % (11/06 0943)  Intake/Output from previous day: 11/05 0701 - 11/06 0700 In: 2622.6 [P.O.:240; I.V.:2318.9; IV Piggyback:63.7] Out: 2550 [Urine:2500; Blood:50] Intake/Output this shift: Total I/O In: 100 [Other:100] Out: 0   No results for input(s): HGB in the last 72 hours. No results for input(s): WBC, RBC, HCT, PLT in the last 72 hours. Recent Labs    01/05/20 0406  NA 135  K 4.5  CL 107  CO2 22  BUN 13  CREATININE 0.89  GLUCOSE 135*  CALCIUM 8.6*   No results for input(s): LABPT, INR in the last 72 hours.  Neurologically intact, new dressing applied. Incision looks good.  No results found.  Assessment/Plan: 1 Day Post-Op Procedure(s) (LRB): LEFT TOTAL KNEE ARTHROPLASTY (Left) Up with therapy  Eldred Manges 01/05/2020, 10:26 AM

## 2020-01-05 NOTE — Plan of Care (Signed)
  Problem: Education: Goal: Knowledge of General Education information will improve Description: Including pain rating scale, medication(s)/side effects and non-pharmacologic comfort measures Outcome: Progressing   Problem: Activity: Goal: Risk for activity intolerance will decrease Outcome: Progressing   

## 2020-01-06 DIAGNOSIS — F17210 Nicotine dependence, cigarettes, uncomplicated: Secondary | ICD-10-CM | POA: Diagnosis present

## 2020-01-06 DIAGNOSIS — R32 Unspecified urinary incontinence: Secondary | ICD-10-CM | POA: Diagnosis present

## 2020-01-06 DIAGNOSIS — K760 Fatty (change of) liver, not elsewhere classified: Secondary | ICD-10-CM | POA: Diagnosis present

## 2020-01-06 DIAGNOSIS — Z96659 Presence of unspecified artificial knee joint: Secondary | ICD-10-CM

## 2020-01-06 DIAGNOSIS — N3281 Overactive bladder: Secondary | ICD-10-CM | POA: Diagnosis present

## 2020-01-06 DIAGNOSIS — J45909 Unspecified asthma, uncomplicated: Secondary | ICD-10-CM | POA: Diagnosis present

## 2020-01-06 DIAGNOSIS — Z89029 Acquired absence of unspecified finger(s): Secondary | ICD-10-CM | POA: Diagnosis not present

## 2020-01-06 DIAGNOSIS — K219 Gastro-esophageal reflux disease without esophagitis: Secondary | ICD-10-CM | POA: Diagnosis present

## 2020-01-06 DIAGNOSIS — M329 Systemic lupus erythematosus, unspecified: Secondary | ICD-10-CM | POA: Diagnosis not present

## 2020-01-06 DIAGNOSIS — E7800 Pure hypercholesterolemia, unspecified: Secondary | ICD-10-CM | POA: Diagnosis present

## 2020-01-06 DIAGNOSIS — Z20822 Contact with and (suspected) exposure to covid-19: Secondary | ICD-10-CM | POA: Diagnosis present

## 2020-01-06 DIAGNOSIS — I7300 Raynaud's syndrome without gangrene: Secondary | ICD-10-CM | POA: Diagnosis not present

## 2020-01-06 DIAGNOSIS — Z7982 Long term (current) use of aspirin: Secondary | ICD-10-CM | POA: Diagnosis not present

## 2020-01-06 DIAGNOSIS — Z79899 Other long term (current) drug therapy: Secondary | ICD-10-CM | POA: Diagnosis not present

## 2020-01-06 DIAGNOSIS — I10 Essential (primary) hypertension: Secondary | ICD-10-CM | POA: Diagnosis present

## 2020-01-06 DIAGNOSIS — Z96652 Presence of left artificial knee joint: Secondary | ICD-10-CM

## 2020-01-06 DIAGNOSIS — I739 Peripheral vascular disease, unspecified: Secondary | ICD-10-CM | POA: Diagnosis not present

## 2020-01-06 DIAGNOSIS — M1712 Unilateral primary osteoarthritis, left knee: Secondary | ICD-10-CM | POA: Diagnosis present

## 2020-01-06 LAB — CBC
HCT: 29.6 % — ABNORMAL LOW (ref 36.0–46.0)
Hemoglobin: 9.5 g/dL — ABNORMAL LOW (ref 12.0–15.0)
MCH: 31.5 pg (ref 26.0–34.0)
MCHC: 32.1 g/dL (ref 30.0–36.0)
MCV: 98 fL (ref 80.0–100.0)
Platelets: 124 10*3/uL — ABNORMAL LOW (ref 150–400)
RBC: 3.02 MIL/uL — ABNORMAL LOW (ref 3.87–5.11)
RDW: 13.6 % (ref 11.5–15.5)
WBC: 12.8 10*3/uL — ABNORMAL HIGH (ref 4.0–10.5)
nRBC: 0 % (ref 0.0–0.2)

## 2020-01-06 MED ORDER — LIP MEDEX EX OINT
TOPICAL_OINTMENT | CUTANEOUS | Status: AC
  Administered 2020-01-06: 1
  Filled 2020-01-06: qty 7

## 2020-01-06 MED ORDER — SODIUM CHLORIDE 0.9 % IV BOLUS
500.0000 mL | Freq: Once | INTRAVENOUS | Status: AC
  Administered 2020-01-06: 500 mL via INTRAVENOUS

## 2020-01-06 MED ORDER — ENSURE ENLIVE PO LIQD
237.0000 mL | Freq: Two times a day (BID) | ORAL | Status: DC
  Administered 2020-01-07 (×2): 237 mL via ORAL

## 2020-01-06 NOTE — Progress Notes (Signed)
Pt much more alert at this time and now making urine in her purewick. Pt bp also improved. Rn will continue to monitor.

## 2020-01-06 NOTE — Progress Notes (Signed)
Physical Therapy Treatment Patient Details Name: Crystal Patterson MRN: [id number] DOB: May 08, 1965 Today's Date: 01/06/2020    History of Present Illness Patient is 54 y.o. female s/p Lt TKA on 01/04/20 with PMH significant for OA, HTN, Lupus.    PT Comments    The patient is very lethargic, only able to stand and transfer to [facility], then stood  For recliner to be brought up. Patient required max assist of 2 persons for Mobility this visit for safety. Patient may benefit from SNF for rehab if her pain and mobility is slower than anticipated. Patient did ambulate yesterday x 2 With PT.   Follow Up Recommendations  SNF     Equipment Recommendations  None recommended by PT    Recommendations for Other Services       Precautions / Restrictions Precautions Precautions: Fall Restrictions Weight Bearing Restrictions: No LLE Weight Bearing: Weight bearing as tolerated    Mobility  Bed Mobility Overal bed mobility: Needs Assistance Bed Mobility: Supine to Sit     Supine to sit: Min assist     General bed mobility comments: frequent cues to move legs to bed edge, assisted with left leg  Transfers Overall transfer level: Needs assistance Equipment used: Rolling walker (2 wheeled) Transfers: Sit to/from [employer] Sit to Stand: Max assist;+2 physical assistance;+2 safety/equipment Stand pivot transfers: Max assist;+2 physical assistance;+2 safety/equipment       General transfer comment: max assistanc eto stand from bed, frequent verbal and tactile stimulation to arouse the patient. Patient stood from bed with max asistance. small steps to turn to [facility]. Patient stood from [facility], able to perform pericare, Recliner brought up for patient to sit down.  Ambulation/Gait             General Gait Details: unable due to lethargy   Stairs             Wheelchair Mobility    Modified Rankin (Stroke Patients Only)       Balance Overall balance assessment:  Needs assistance Sitting-balance support: Feet supported;Bilateral upper extremity supported Sitting balance-Leahy Scale: Poor Sitting balance - Comments: lethargic   Standing balance support: Bilateral upper extremity supported;During functional activity Standing balance-Leahy Scale: Poor Standing balance comment: lethargic, support  while tnading. Decreased weight on  lt. leg.                            Cognition Arousal/Alertness: Lethargic;Suspect due to medications Behavior During Therapy:  (lethargic, barly responds,) Overall Cognitive Status: Impaired/Different from baseline                                 General Comments: patient is so lethargic, she does not keep eyes open, frequent stimulation to keep aroused enough to participate in mobility      Exercises      General Comments        Pertinent Vitals/Pain Pain Assessment: Faces Faces Pain Scale: Hurts whole lot Pain Location: L knee Pain Descriptors / Indicators: Grimacing;Guarding;Moaning Pain Intervention(s): Monitored during session;Premedicated before session;Repositioned    Home Living                      Prior Function            PT Goals (current goals can now be found in the care plan section) Progress towards PT goals: Not progressing toward  goals - comment (lethargic)    Frequency    7X/week      PT Plan Discharge plan needs to be updated    Co-evaluation              AM-PAC PT "6 Clicks" Mobility   Outcome Measure  Help needed turning from your back to your side while in a flat bed without using bedrails?: A Lot Help needed moving from lying on your back to sitting on the side of a flat bed without using bedrails?: A Lot Help needed moving to and from a bed to a chair (including a wheelchair)?: A Lot Help needed standing up from a chair using your arms (e.g., wheelchair or bedside chair)?: A Lot Help needed to walk in hospital room?:  Total Help needed climbing 3-5 steps with a railing? : Total 6 Click Score: 10    End of Session Equipment Utilized During Treatment: Gait belt Activity Tolerance: Patient limited by lethargy Patient left: in chair;with call bell/phone within reach;with chair alarm set Nurse Communication: Mobility status PT Visit Diagnosis: Muscle weakness (generalized) (M62.81);Difficulty in walking, not elsewhere classified (R26.2)     Time: 0630-1601 PT Time Calculation (min) (ACUTE ONLY): 24 min  Charges:  $Therapeutic Activity: 23-37 mins                     Blanchard Kelch PT Acute Rehabilitation Services Pager [phone] Office [phone]    Rada Hay 01/06/2020, 11:21 AM

## 2020-01-06 NOTE — Progress Notes (Signed)
Dr. Ophelia Charter notified of pt low bp, lethargy, and poor urine output. Normal saline bolus ordered and given. Pt up in chair. Pt awake and responds appropriately but responds appropriately.

## 2020-01-06 NOTE — Progress Notes (Signed)
   Subjective: 2 Days Post-Op Procedure(s) (LRB): LEFT TOTAL KNEE ARTHROPLASTY (Left) Patient reports pain as moderate and severe.  Eating and talking but states pain bad and getting pain meds as often as possible. Inferior dressing leaking blood. Reinforced by RN.   Objective: Vital signs in last 24 hours: Temp:  [98 F (36.7 C)-99.1 F (37.3 C)] 99.1 F (37.3 C) (11/07 0441) Pulse Rate:  [53-65] 62 (11/07 0441) Resp:  [16-19] 16 (11/07 0441) BP: (97-123)/(68-79) 107/68 (11/07 0441) SpO2:  [95 %-98 %] 97 % (11/07 0441)  Intake/Output from previous day: 11/06 0701 - 11/07 0700 In: 1087.5 [P.O.:420; I.V.:567.5] Out: 1775 [Urine:1775] Intake/Output this shift: Total I/O In: 360 [P.O.:360] Out: -   Recent Labs    01/05/20 0935 01/06/20 0753  HGB 9.7* 9.5*   Recent Labs    01/05/20 0935 01/06/20 0753  WBC 9.9 12.8*  RBC 3.10* 3.02*  HCT 31.1* 29.6*  PLT PLATELET CLUMPS NOTED ON SMEAR, UNABLE TO ESTIMATE 124*   Recent Labs    01/05/20 0406  NA 135  K 4.5  CL 107  CO2 22  BUN 13  CREATININE 0.89  GLUCOSE 135*  CALCIUM 8.6*   No results for input(s): LABPT, INR in the last 72 hours.  dressing with blood dark coming from bottom of aquacell. new dressing applied.  No results found.  Assessment/Plan: 2 Days Post-Op Procedure(s) (LRB): LEFT TOTAL KNEE ARTHROPLASTY (Left) Discharge to SNF, changed dressing . Hgb stable. Continue PT  Eldred Manges 01/06/2020, 9:31 AM

## 2020-01-06 NOTE — Plan of Care (Signed)
  Problem: Pain Management: Goal: Pain level will decrease with appropriate interventions Outcome: Progressing   Problem: Skin Integrity: Goal: Will show signs of wound healing Outcome: Progressing   Problem: Health Behavior/Discharge Planning: Goal: Ability to manage health-related needs will improve Outcome: Progressing   Problem: Clinical Measurements: Goal: Ability to maintain clinical measurements within normal limits will improve Outcome: Progressing   Problem: Activity: Goal: Risk for activity intolerance will decrease Outcome: Progressing

## 2020-01-07 ENCOUNTER — Encounter (HOSPITAL_COMMUNITY): Payer: Self-pay | Admitting: Orthopaedic Surgery

## 2020-01-07 MED ORDER — METHOCARBAMOL 500 MG PO TABS: 500.0000 mg | ORAL_TABLET | Freq: Four times a day (QID) | ORAL | 1 refills | Status: DC | PRN

## 2020-01-07 MED ORDER — ASPIRIN 81 MG PO CHEW
81.0000 mg | CHEWABLE_TABLET | Freq: Two times a day (BID) | ORAL | 0 refills | Status: DC
Start: 2020-01-07 — End: 2022-06-29

## 2020-01-07 MED ORDER — OXYCODONE HCL 5 MG PO TABS
5.0000 mg | ORAL_TABLET | ORAL | 0 refills | Status: DC | PRN
Start: 2020-01-07 — End: 2020-01-17

## 2020-01-07 NOTE — Plan of Care (Signed)
Problem: Education: Goal: Knowledge of the prescribed therapeutic regimen will improve 01/07/2020 1145 by Minette Brine, RN Outcome: Progressing 01/07/2020 1145 by Minette Brine, RN Outcome: Not Progressing Goal: Individualized Educational Video(s) 01/07/2020 1145 by Minette Brine, RN Outcome: Progressing 01/07/2020 1145 by Minette Brine, RN Outcome: Not Progressing   Problem: Activity: Goal: Ability to avoid complications of mobility impairment will improve 01/07/2020 1145 by Finnick Orosz, Petra Kuba, RN Outcome: Progressing 01/07/2020 1145 by Minette Brine, RN Outcome: Not Progressing Goal: Range of joint motion will improve 01/07/2020 1145 by Mazell Aylesworth, Petra Kuba, RN Outcome: Progressing 01/07/2020 1145 by Minette Brine, RN Outcome: Not Progressing   Problem: Clinical Measurements: Goal: Postoperative complications will be avoided or minimized 01/07/2020 1145 by Sanjuana Mruk, Petra Kuba, RN Outcome: Progressing 01/07/2020 1145 by Minette Brine, RN Outcome: Not Progressing   Problem: Pain Management: Goal: Pain level will decrease with appropriate interventions 01/07/2020 1145 by Minette Brine, RN Outcome: Progressing 01/07/2020 1145 by Minette Brine, RN Outcome: Not Progressing   Problem: Skin Integrity: Goal: Will show signs of wound healing 01/07/2020 1145 by Minette Brine, RN Outcome: Progressing 01/07/2020 1145 by Minette Brine, RN Outcome: Not Progressing   Problem: Education: Goal: Knowledge of General Education information will improve Description: Including pain rating scale, medication(s)/side effects and non-pharmacologic comfort measures 01/07/2020 1145 by Minette Brine, RN Outcome: Progressing 01/07/2020 1145 by Minette Brine, RN Outcome: Not Progressing   Problem: Health Behavior/Discharge Planning: Goal: Ability to manage health-related needs will improve 01/07/2020 1145 by Hadja Harral, Petra Kuba, RN Outcome:  Progressing 01/07/2020 1145 by Minette Brine, RN Outcome: Not Progressing   Problem: Clinical Measurements: Goal: Ability to maintain clinical measurements within normal limits will improve 01/07/2020 1145 by Minette Brine, RN Outcome: Progressing 01/07/2020 1145 by Minette Brine, RN Outcome: Not Progressing Goal: Will remain free from infection 01/07/2020 1145 by Minette Brine, RN Outcome: Progressing 01/07/2020 1145 by Minette Brine, RN Outcome: Not Progressing Goal: Diagnostic test results will improve 01/07/2020 1145 by Minette Brine, RN Outcome: Progressing 01/07/2020 1145 by Minette Brine, RN Outcome: Not Progressing Goal: Respiratory complications will improve 01/07/2020 1145 by Minette Brine, RN Outcome: Progressing 01/07/2020 1145 by Minette Brine, RN Outcome: Not Progressing Goal: Cardiovascular complication will be avoided 01/07/2020 1145 by Minette Brine, RN Outcome: Progressing 01/07/2020 1145 by Minette Brine, RN Outcome: Not Progressing   Problem: Activity: Goal: Risk for activity intolerance will decrease 01/07/2020 1145 by Huck Ashworth, Petra Kuba, RN Outcome: Progressing 01/07/2020 1145 by Minette Brine, RN Outcome: Not Progressing   Problem: Nutrition: Goal: Adequate nutrition will be maintained 01/07/2020 1145 by Minette Brine, RN Outcome: Progressing 01/07/2020 1145 by Minette Brine, RN Outcome: Not Progressing   Problem: Coping: Goal: Level of anxiety will decrease 01/07/2020 1145 by Minette Brine, RN Outcome: Progressing 01/07/2020 1145 by Minette Brine, RN Outcome: Not Progressing   Problem: Elimination: Goal: Will not experience complications related to bowel motility 01/07/2020 1145 by Minette Brine, RN Outcome: Progressing 01/07/2020 1145 by Minette Brine, RN Outcome: Not Progressing Goal: Will not experience complications related to urinary retention 01/07/2020 1145 by Sallyann Kinnaird, Petra Kuba, RN Outcome: Progressing 01/07/2020 1145 by Minette Brine, RN Outcome: Not Progressing   Problem: Safety: Goal: Ability to remain free from injury will improve 01/07/2020 1145 by Minette Brine, RN Outcome: Progressing 01/07/2020 1145 by Minette Brine, RN Outcome: Not Progressing  Problem: Pain Managment: Goal: General experience of comfort will improve 01/07/2020 1145 by Yvette Roark, Petra Kuba, RN Outcome: Progressing 01/07/2020 1145 by Minette Brine, RN Outcome: Not Progressing   Problem: Skin Integrity: Goal: Risk for impaired skin integrity will decrease 01/07/2020 1145 by Minette Brine, RN Outcome: Progressing 01/07/2020 1145 by Minette Brine, RN Outcome: Not Progressing

## 2020-01-07 NOTE — Progress Notes (Signed)
Physical Therapy Treatment Patient Details Name: Crystal Patterson MRN: [id number] DOB: 1965/12/15 Today's Date: 01/07/2020    History of Present Illness Patient is 54 y.o. female s/p Lt TKA on 01/04/20 with PMH significant for OA, HTN, Lupus.    PT Comments    Pt ambulated in hallway and requested return to bed (had been up in recliner on arrival).  Pt not requiring assist for mobility today and more awake/alert then yesterday.  Pt states her daughter can assist her upon d/c home.  Pt will likely be able to d/c home however will practice steps prior to d/c.    Follow Up Recommendations  Home health PT     Equipment Recommendations  None recommended by PT    Recommendations for Other Services       Precautions / Restrictions Precautions Precautions: Fall;Knee Restrictions LLE Weight Bearing: Weight bearing as tolerated    Mobility  Bed Mobility               General bed mobility comments: pt in recliner on arrival  Transfers Overall transfer level: Needs assistance Equipment used: Rolling walker (2 wheeled) Transfers: Sit to/from Stand Sit to Stand: Min guard         General transfer comment: min/guard for safety however pt able to perform without assist today  Ambulation/Gait Ambulation/Gait assistance: Min guard Gait Distance (Feet): 80 Feet Assistive device: Rolling walker (2 wheeled) Gait Pattern/deviations: Step-to pattern;Decreased stride length;Decreased weight shift to left     General Gait Details: verbal cues for sequence, RW positioning, posture, recliner followed for safety however not needed   Stairs             Wheelchair Mobility    Modified Rankin (Stroke Patients Only)       Balance                                            Cognition Arousal/Alertness: Awake/alert Behavior During Therapy: WFL for tasks assessed/performed Overall Cognitive Status: Within Functional Limits for tasks assessed                                         Exercises      General Comments        Pertinent Vitals/Pain Pain Assessment: 0-10 Pain Score: 6  Pain Location: L knee Pain Descriptors / Indicators: Grimacing;Guarding;Moaning Pain Intervention(s): Repositioned;Monitored during session    Home Living                      Prior Function            PT Goals (current goals can now be found in the care plan section) Progress towards PT goals: Progressing toward goals    Frequency    7X/week      PT Plan Discharge plan needs to be updated    Co-evaluation              AM-PAC PT "6 Clicks" Mobility   Outcome Measure  Help needed turning from your back to your side while in a flat bed without using bedrails?: A Little Help needed moving from lying on your back to sitting on the side of a flat bed without using bedrails?: A Little Help needed moving to and from  a bed to a chair (including a wheelchair)?: A Little Help needed standing up from a chair using your arms (e.g., wheelchair or bedside chair)?: A Little Help needed to walk in hospital room?: A Little Help needed climbing 3-5 steps with a railing? : A Little 6 Click Score: 18    End of Session Equipment Utilized During Treatment: Gait belt Activity Tolerance: Patient tolerated treatment well Patient left: in bed;with bed alarm set;with call bell/phone within reach Nurse Communication: Mobility status PT Visit Diagnosis: Muscle weakness (generalized) (M62.81);Difficulty in walking, not elsewhere classified (R26.2)     Time: 7939-0300 PT Time Calculation (min) (ACUTE ONLY): 16 min  Charges:  $Gait Training: 8-22 mins                     Paulino Door, DPT Acute Rehabilitation Services Pager: [phone] Office: [phone]  Maida Sale E 01/07/2020, 1:06 PM

## 2020-01-07 NOTE — Progress Notes (Signed)
Physical Therapy Treatment Patient Details Name: Crystal Patterson MRN: [id number] DOB: [birth year] Today's Date: 01/07/2020    History of Present Illness Patient is 54 y.o. female s/p Lt TKA on 01/04/20 with PMH significant for OA, HTN, Lupus.    PT Comments    Pt assisted with ambulating in hallway and practiced safe stair technique.  Daughter present and observed ambulation and assisted with holding RW for steps.  Pt provided with HEP and stair handouts.  Pt and daughter had no further questions, and pt feels ready for d/c home today.   Follow Up Recommendations  Home health PT     Equipment Recommendations  None recommended by PT    Recommendations for Other Services       Precautions / Restrictions Precautions Precautions: Fall;Knee Restrictions LLE Weight Bearing: Weight bearing as tolerated Other Position/Activity Restrictions: WBAT    Mobility  Bed Mobility Overal bed mobility: Needs Assistance Bed Mobility: Supine to Sit     Supine to sit: Supervision;HOB elevated     General bed mobility comments: pt in recliner on arrival  Transfers Overall transfer level: Needs assistance Equipment used: Rolling walker (2 wheeled) Transfers: Sit to/from Stand Sit to Stand: Min guard         General transfer comment: min/guard for safety however pt able to perform without assist today  Ambulation/Gait Ambulation/Gait assistance: Min guard Gait Distance (Feet): 80 Feet Assistive device: Rolling walker (2 wheeled) Gait Pattern/deviations: Step-to pattern;Decreased stride length;Decreased weight shift to left     General Gait Details: verbal cues for sequence, RW positioning, posture   Stairs Stairs: Yes Stairs assistance: Min guard Stair Management: Step to pattern;Backwards;With walker Number of Stairs: 2 General stair comments: pt first performed with rail and cane however then stated she doesn't have rail at home, educated pt and daughter on backwards technique  with RW and daughter assisted with holding RW; provided handout, both pt and daughter reports understanding   Wheelchair Mobility    Modified Rankin (Stroke Patients Only)       Balance                                            Cognition Arousal/Alertness: Awake/alert Behavior During Therapy: WFL for tasks assessed/performed Overall Cognitive Status: Within Functional Limits for tasks assessed                                        Exercises      General Comments        Pertinent Vitals/Pain Pain Assessment: 0-10 Pain Score: 5  Pain Location: L knee Pain Descriptors / Indicators: Grimacing;Guarding;Sore Pain Intervention(s): Repositioned;Monitored during session;Patient requesting pain meds-RN notified    Home Living                      Prior Function            PT Goals (current goals can now be found in the care plan section) Progress towards PT goals: Progressing toward goals    Frequency    7X/week      PT Plan Current plan remains appropriate    Co-evaluation              AM-PAC PT "6 Clicks" Mobility   Outcome Measure  Help needed turning from your back to your side while in a flat bed without using bedrails?: A Little Help needed moving from lying on your back to sitting on the side of a flat bed without using bedrails?: A Little Help needed moving to and from a bed to a chair (including a wheelchair)?: A Little Help needed standing up from a chair using your arms (e.g., wheelchair or bedside chair)?: A Little Help needed to walk in hospital room?: A Little Help needed climbing 3-5 steps with a railing? : A Little 6 Click Score: 18    End of Session Equipment Utilized During Treatment: Gait belt Activity Tolerance: Patient tolerated treatment well Patient left: in bed;with call bell/phone within reach;with family/visitor present (sitting EOB with daughter and lunch tray) Nurse  Communication: Mobility status;Patient requests pain meds PT Visit Diagnosis: Muscle weakness (generalized) (M62.81);Difficulty in walking, not elsewhere classified (R26.2)     Time: 7517-0017 PT Time Calculation (min) (ACUTE ONLY): 15 min  Charges:  $Gait Training: 8-22 mins                    Paulino Door, DPT Acute Rehabilitation Services Pager: [phone] Office: [phone]   [name] 01/07/2020, 4:23 PM

## 2020-01-07 NOTE — Progress Notes (Signed)
Patient ID: Crystal Patterson, female   DOB: [birth year], 54 y.o.   MRN: [id number] The patient is awake and alert this morning with stable vital signs.  According to the notes from the weekend, she has had a difficult time getting around and therapy is recommended short-term skilled nursing.  Consultation has been put into the transitional care team.  I spoke to the patient about this this morning.  Her vitals are stable and her left knee is stable.  She states that she would rather go home.  We will need to see what therapy thinks today as well as the transitional care team and talk with the patient further as well.  We will see how she is doing and certainly by tomorrow need to have a disposition in terms of skilled nursing versus home.

## 2020-01-07 NOTE — TOC Transition Note (Addendum)
Transition of Care [facility]) - CM/SW Discharge Note   Patient Details  Name: MARGRIT MINNER MRN: [id number] Date of Birth: 05-Dec-1965  Transition of Care [facility]) CM/SW Contact:  Clearance Coots, LCSW Phone Number: 01/07/2020, 2:19 PM   Clinical Narrative:    Patient reports her FWW wheels are broken and has requested a RW. [employer] not onsite at this time to deliver a 3 in1.  CSW ordered a RW and 3 in 1 through [employer].   [facility] notified of the discharge.   No other needs identified.    Final next level of care: Home w Home Health Services Barriers to Discharge: Barriers Resolved   Patient Goals and CMS Choice        Discharge Placement                       Discharge Plan and Services                DME Arranged: 3-N-1, Dan Humphreys wide DME Agency: [employer] Date DME Agency Contacted: 01/07/20 Time DME Agency Contacted: 1414 Representative spoke with at DME Agency: Velna Hatchet HH Arranged: PT HH Agency: [facility] Date HH Agency Contacted: 01/07/20 Time HH Agency Contacted: 1414 Representative spoke with at [facility] Agency: Kathlene November  Social Determinants of Health (SDOH) Interventions     Readmission Risk Interventions No flowsheet data found.

## 2020-01-07 NOTE — Discharge Summary (Signed)
Patient ID: Crystal Patterson MRN: [id number] DOB/AGE: 1965-04-20 54 y.o.  Admit date: 01/04/2020 Discharge date: 01/07/2020  Admission Diagnoses:  Principal Problem:   Unilateral primary osteoarthritis, left knee Active Problems:   Status post total knee replacement, left   Status post total knee replacement   Status post total left knee replacement   Discharge Diagnoses:  Same  Past Medical History:  Diagnosis Date  . Anginal pain (HCC) 2020  . Arthritis    Knees, hips,  . Elevated troponin 05/24/2016  . Finger amputation, no complication   . Heart murmur   . Hypercholesteremia   . Hypertension   . Lupus (HCC)     Surgeries: Procedure(s): LEFT TOTAL KNEE ARTHROPLASTY on 01/04/2020   Consultants:   Discharged Condition: Improved  Hospital Course: Crystal Patterson is an 54 y.o. female who was admitted 01/04/2020 for operative treatment ofUnilateral primary osteoarthritis, left knee. Patient has severe unremitting pain that affects sleep, daily activities, and work/hobbies. After pre-op clearance the patient was taken to the operating room on 01/04/2020 and underwent  Procedure(s): LEFT TOTAL KNEE ARTHROPLASTY.    Patient was given perioperative antibiotics:  Anti-infectives (From admission, onward)   Start     Dose/Rate Route Frequency Ordered Stop   01/04/20 1400  ceFAZolin (ANCEF) IVPB 1 g/50 mL premix        1 g 100 mL/hr over 30 Minutes Intravenous Every 6 hours 01/04/20 1055 01/04/20 2021   01/04/20 0600  ceFAZolin (ANCEF) IVPB 2g/100 mL premix        2 g 200 mL/hr over 30 Minutes Intravenous On call to O.R. 01/04/20 0545 01/04/20 1607       Patient was given sequential compression devices, early ambulation, and chemoprophylaxis to prevent DVT.  Patient benefited maximally from hospital stay and there were no complications.    Recent vital signs:  Patient Vitals for the past 24 hrs:  BP Temp Temp src Pulse Resp SpO2  01/07/20 1000 (!) 92/58 - - 64 - -  01/07/20  0532 90/63 99.5 F (37.5 C) - 61 16 94 %  01/06/20 2109 100/67 98.6 F (37 C) - 60 16 93 %  01/06/20 1651 (!) 96/53 98.5 F (36.9 C) Oral (!) 58 18 98 %  01/06/20 1540 (!) 96/40 - - 62 18 -  01/06/20 1512 (!) 94/55 - - (!) 59 18 -  01/06/20 1454 (!) 92/56 - - 60 18 -  01/06/20 1415 (!) 87/59 97.9 F (36.6 C) Oral (!) 54 16 98 %     Recent laboratory studies:  Recent Labs    01/05/20 0406 01/05/20 0935 01/06/20 0753  WBC  --  9.9 12.8*  HGB  --  9.7* 9.5*  HCT  --  31.1* 29.6*  PLT  --  PLATELET CLUMPS NOTED ON SMEAR, UNABLE TO ESTIMATE 124*  NA 135  --   --   K 4.5  --   --   CL 107  --   --   CO2 22  --   --   BUN 13  --   --   CREATININE 0.89  --   --   GLUCOSE 135*  --   --   CALCIUM 8.6*  --   --      Discharge Medications:   Allergies as of 01/07/2020   No Known Allergies     Medication List    STOP taking these medications   aspirin 81 MG EC tablet Replaced by: aspirin 81 MG  chewable tablet     TAKE these medications   albuterol 108 (90 Base) MCG/ACT inhaler Commonly known as: VENTOLIN HFA Inhale 2 puffs into the lungs every 6 (six) hours as needed for wheezing or shortness of breath.   allopurinol 100 MG tablet Commonly known as: ZYLOPRIM Take 1 tablet (100 mg total) by mouth daily.   amLODipine 10 MG tablet Commonly known as: NORVASC Take 1 tablet (10 mg total) by mouth daily.   aspirin 81 MG chewable tablet Chew 1 tablet (81 mg total) by mouth 2 (two) times daily. Replaces: aspirin 81 MG EC tablet   atenolol 25 MG tablet Commonly known as: TENORMIN Take 1 tablet (25 mg total) by mouth daily.   atorvastatin 40 MG tablet Commonly known as: LIPITOR Take 1 tablet (40 mg total) by mouth daily at 6 PM. What changed: when to take this   benazepril 40 MG tablet Commonly known as: LOTENSIN Take 1 tablet (40 mg total) by mouth daily.   diclofenac sodium 1 % Gel Commonly known as: VOLTAREN Apply 1 application topically 4 (four) times daily as  needed (pain).   furosemide 40 MG tablet Commonly known as: LASIX Take 1 tablet (40 mg total) by mouth daily.   gabapentin 300 MG capsule Commonly known as: NEURONTIN Take 300 mg by mouth daily.   ibuprofen 800 MG tablet Commonly known as: ADVIL Take 800 mg by mouth 2 (two) times daily as needed for headache or moderate pain.   ITCHY EYE DROPS OP Place 1 drop into both eyes daily as needed (itchy eyes).   methocarbamol 500 MG tablet Commonly known as: ROBAXIN Take 1 tablet (500 mg total) by mouth every 6 (six) hours as needed for muscle spasms.   omeprazole 20 MG capsule Commonly known as: PRILOSEC Take 20 mg by mouth daily.   oxyCODONE 5 MG immediate release tablet Commonly known as: Oxy IR/ROXICODONE Take 1-2 tablets (5-10 mg total) by mouth every 4 (four) hours as needed for moderate pain (pain score 4-6).   potassium chloride 10 MEQ tablet Commonly known as: KLOR-CON Take 10 mEq by mouth daily.   spironolactone 25 MG tablet Commonly known as: ALDACTONE Take 25 mg by mouth daily.   zolpidem 10 MG tablet Commonly known as: AMBIEN Take 10 mg by mouth at bedtime.            Durable Medical Equipment  (From admission, onward)         Start     Ordered   01/04/20 1055  DME Walker rolling  Once       Question Answer Comment  Walker: With 5 Inch Wheels   Patient needs a walker to treat with the following condition Status post total left knee replacement      01/04/20 1055   01/04/20 1055  DME 3 n 1  Once        01/04/20 1055          Diagnostic Studies: DG Knee Left Port  Result Date: 01/04/2020 CLINICAL DATA:  Status post left total knee arthroplasty. EXAM: PORTABLE LEFT KNEE - 1-2 VIEW COMPARISON:  September 26, 2019. FINDINGS: The left femoral and tibial components are well situated. Expected postoperative changes are noted in the soft tissues anteriorly. IMPRESSION: Status post left total knee arthroplasty. Electronically Signed   By: Lupita Raider M.D.    On: 01/04/2020 09:53    Disposition: Discharge disposition: 01-Home or Self Care  Follow-up Information    [name], MD Follow up on 01/17/2020.   Specialty: Orthopedic Surgery Why: at 1:30 pm for your 2 week in office post op appointment Contact information: [address] [place] [address] [phone]        [facility] Follow up.   Specialty: Home Health Services Why: Someone from the home health agency will be in contact with you after discharge to arrange your first in home physical therapy visit. Contact information: [address] [place] [address] [phone]        [facility] Follow up.   Specialty: Physical Therapy Why: This appointment is pending. The office RN Case Manager will be in contact with your regarding scheduling this appointment. Contact information: [facility] [address] [place] [address] [phone]                Signed: [name] 01/07/2020, 2:03 PM

## 2020-01-07 NOTE — Plan of Care (Signed)
  Problem: Education: Goal: Knowledge of the prescribed therapeutic regimen will improve Outcome: Progressing Goal: Individualized Educational Video(s) Outcome: Progressing   Problem: Activity: Goal: Ability to avoid complications of mobility impairment will improve Outcome: Progressing Goal: Range of joint motion will improve Outcome: Progressing   Problem: Clinical Measurements: Goal: Postoperative complications will be avoided or minimized Outcome: Progressing   Problem: Pain Management: Goal: Pain level will decrease with appropriate interventions Outcome: Progressing   Problem: Education: Goal: Knowledge of General Education information will improve Description: Including pain rating scale, medication(s)/side effects and non-pharmacologic comfort measures Outcome: Progressing   Problem: Health Behavior/Discharge Planning: Goal: Ability to manage health-related needs will improve Outcome: Progressing   Problem: Clinical Measurements: Goal: Ability to maintain clinical measurements within normal limits will improve Outcome: Progressing Goal: Will remain free from infection Outcome: Progressing Goal: Diagnostic test results will improve Outcome: Progressing Goal: Respiratory complications will improve Outcome: Progressing Goal: Cardiovascular complication will be avoided Outcome: Progressing   Problem: Nutrition: Goal: Adequate nutrition will be maintained Outcome: Progressing   Problem: Coping: Goal: Level of anxiety will decrease Outcome: Progressing   Problem: Elimination: Goal: Will not experience complications related to bowel motility Outcome: Progressing Goal: Will not experience complications related to urinary retention Outcome: Progressing   Problem: Pain Managment: Goal: General experience of comfort will improve Outcome: Progressing   Problem: Safety: Goal: Ability to remain free from injury will improve Outcome: Progressing   Problem: Skin  Integrity: Goal: Risk for impaired skin integrity will decrease Outcome: Progressing

## 2020-01-08 ENCOUNTER — Telehealth: Payer: Self-pay | Admitting: *Deleted

## 2020-01-08 NOTE — Telephone Encounter (Signed)
Attempted Ortho bundle call to patient.

## 2020-01-09 ENCOUNTER — Telehealth: Payer: Self-pay | Admitting: *Deleted

## 2020-01-09 DIAGNOSIS — M329 Systemic lupus erythematosus, unspecified: Secondary | ICD-10-CM | POA: Diagnosis not present

## 2020-01-09 DIAGNOSIS — G43909 Migraine, unspecified, not intractable, without status migrainosus: Secondary | ICD-10-CM | POA: Diagnosis not present

## 2020-01-09 DIAGNOSIS — M1711 Unilateral primary osteoarthritis, right knee: Secondary | ICD-10-CM | POA: Diagnosis not present

## 2020-01-09 DIAGNOSIS — E7800 Pure hypercholesterolemia, unspecified: Secondary | ICD-10-CM | POA: Diagnosis not present

## 2020-01-09 DIAGNOSIS — I10 Essential (primary) hypertension: Secondary | ICD-10-CM | POA: Diagnosis not present

## 2020-01-09 DIAGNOSIS — I7300 Raynaud's syndrome without gangrene: Secondary | ICD-10-CM | POA: Diagnosis not present

## 2020-01-09 DIAGNOSIS — Z471 Aftercare following joint replacement surgery: Secondary | ICD-10-CM | POA: Diagnosis not present

## 2020-01-09 DIAGNOSIS — E876 Hypokalemia: Secondary | ICD-10-CM | POA: Diagnosis not present

## 2020-01-09 DIAGNOSIS — N3281 Overactive bladder: Secondary | ICD-10-CM | POA: Diagnosis not present

## 2020-01-09 DIAGNOSIS — K219 Gastro-esophageal reflux disease without esophagitis: Secondary | ICD-10-CM | POA: Diagnosis not present

## 2020-01-09 DIAGNOSIS — I739 Peripheral vascular disease, unspecified: Secondary | ICD-10-CM | POA: Diagnosis not present

## 2020-01-09 DIAGNOSIS — Z89029 Acquired absence of unspecified finger(s): Secondary | ICD-10-CM | POA: Diagnosis not present

## 2020-01-09 DIAGNOSIS — Z96652 Presence of left artificial knee joint: Secondary | ICD-10-CM | POA: Diagnosis not present

## 2020-01-09 DIAGNOSIS — M109 Gout, unspecified: Secondary | ICD-10-CM | POA: Diagnosis not present

## 2020-01-09 DIAGNOSIS — K760 Fatty (change of) liver, not elsewhere classified: Secondary | ICD-10-CM | POA: Diagnosis not present

## 2020-01-09 DIAGNOSIS — R634 Abnormal weight loss: Secondary | ICD-10-CM | POA: Diagnosis not present

## 2020-01-09 DIAGNOSIS — F32A Depression, unspecified: Secondary | ICD-10-CM | POA: Diagnosis not present

## 2020-01-09 DIAGNOSIS — F17210 Nicotine dependence, cigarettes, uncomplicated: Secondary | ICD-10-CM | POA: Diagnosis not present

## 2020-01-09 DIAGNOSIS — J45909 Unspecified asthma, uncomplicated: Secondary | ICD-10-CM | POA: Diagnosis not present

## 2020-01-09 DIAGNOSIS — K589 Irritable bowel syndrome without diarrhea: Secondary | ICD-10-CM | POA: Diagnosis not present

## 2020-01-09 DIAGNOSIS — I209 Angina pectoris, unspecified: Secondary | ICD-10-CM | POA: Diagnosis not present

## 2020-01-09 NOTE — Telephone Encounter (Signed)
Ortho bundle D/C call completed. 

## 2020-01-10 ENCOUNTER — Other Ambulatory Visit: Payer: Self-pay | Admitting: *Deleted

## 2020-01-10 DIAGNOSIS — E7800 Pure hypercholesterolemia, unspecified: Secondary | ICD-10-CM | POA: Diagnosis not present

## 2020-01-10 DIAGNOSIS — I739 Peripheral vascular disease, unspecified: Secondary | ICD-10-CM | POA: Diagnosis not present

## 2020-01-10 DIAGNOSIS — K219 Gastro-esophageal reflux disease without esophagitis: Secondary | ICD-10-CM | POA: Diagnosis not present

## 2020-01-10 DIAGNOSIS — E876 Hypokalemia: Secondary | ICD-10-CM | POA: Diagnosis not present

## 2020-01-10 DIAGNOSIS — G43909 Migraine, unspecified, not intractable, without status migrainosus: Secondary | ICD-10-CM | POA: Diagnosis not present

## 2020-01-10 DIAGNOSIS — R634 Abnormal weight loss: Secondary | ICD-10-CM | POA: Diagnosis not present

## 2020-01-10 DIAGNOSIS — I10 Essential (primary) hypertension: Secondary | ICD-10-CM | POA: Diagnosis not present

## 2020-01-10 DIAGNOSIS — N3281 Overactive bladder: Secondary | ICD-10-CM | POA: Diagnosis not present

## 2020-01-10 DIAGNOSIS — Z89029 Acquired absence of unspecified finger(s): Secondary | ICD-10-CM | POA: Diagnosis not present

## 2020-01-10 DIAGNOSIS — Z471 Aftercare following joint replacement surgery: Secondary | ICD-10-CM | POA: Diagnosis not present

## 2020-01-10 DIAGNOSIS — K589 Irritable bowel syndrome without diarrhea: Secondary | ICD-10-CM | POA: Diagnosis not present

## 2020-01-10 DIAGNOSIS — M109 Gout, unspecified: Secondary | ICD-10-CM | POA: Diagnosis not present

## 2020-01-10 DIAGNOSIS — J45909 Unspecified asthma, uncomplicated: Secondary | ICD-10-CM | POA: Diagnosis not present

## 2020-01-10 DIAGNOSIS — F32A Depression, unspecified: Secondary | ICD-10-CM | POA: Diagnosis not present

## 2020-01-10 DIAGNOSIS — Z96652 Presence of left artificial knee joint: Secondary | ICD-10-CM | POA: Diagnosis not present

## 2020-01-10 DIAGNOSIS — K760 Fatty (change of) liver, not elsewhere classified: Secondary | ICD-10-CM | POA: Diagnosis not present

## 2020-01-10 DIAGNOSIS — I209 Angina pectoris, unspecified: Secondary | ICD-10-CM | POA: Diagnosis not present

## 2020-01-10 DIAGNOSIS — F17210 Nicotine dependence, cigarettes, uncomplicated: Secondary | ICD-10-CM | POA: Diagnosis not present

## 2020-01-10 DIAGNOSIS — I7300 Raynaud's syndrome without gangrene: Secondary | ICD-10-CM | POA: Diagnosis not present

## 2020-01-10 DIAGNOSIS — M1711 Unilateral primary osteoarthritis, right knee: Secondary | ICD-10-CM | POA: Diagnosis not present

## 2020-01-10 DIAGNOSIS — M329 Systemic lupus erythematosus, unspecified: Secondary | ICD-10-CM | POA: Diagnosis not present

## 2020-01-10 NOTE — Patient Outreach (Signed)
[facility]) Care Management  01/10/2020  Crystal Patterson 08-12-1965 [id number]   EMMI-GENERAL DISCHARGE-SUCCESSFUL-RESOLVED RED ON EMMI ALERT Day #1 Date:01/09/2020 Red Alert Reason:Discharge paperwork  OUTREACH #1:  RN spoke with pt today and verified the above emmi has been resolved. No additional issues or needs at this time. Pt has a support system in place and [facility] is involved.   Case will be closed with no additional needs.  Elliot Cousin, RN Care Management Coordinator [facility] Main Office [phone]

## 2020-01-11 ENCOUNTER — Telehealth: Payer: Self-pay | Admitting: *Deleted

## 2020-01-11 DIAGNOSIS — R634 Abnormal weight loss: Secondary | ICD-10-CM | POA: Diagnosis not present

## 2020-01-11 DIAGNOSIS — M329 Systemic lupus erythematosus, unspecified: Secondary | ICD-10-CM | POA: Diagnosis not present

## 2020-01-11 DIAGNOSIS — M1711 Unilateral primary osteoarthritis, right knee: Secondary | ICD-10-CM | POA: Diagnosis not present

## 2020-01-11 DIAGNOSIS — K219 Gastro-esophageal reflux disease without esophagitis: Secondary | ICD-10-CM | POA: Diagnosis not present

## 2020-01-11 DIAGNOSIS — J45909 Unspecified asthma, uncomplicated: Secondary | ICD-10-CM | POA: Diagnosis not present

## 2020-01-11 DIAGNOSIS — K760 Fatty (change of) liver, not elsewhere classified: Secondary | ICD-10-CM | POA: Diagnosis not present

## 2020-01-11 DIAGNOSIS — I10 Essential (primary) hypertension: Secondary | ICD-10-CM | POA: Diagnosis not present

## 2020-01-11 DIAGNOSIS — F32A Depression, unspecified: Secondary | ICD-10-CM | POA: Diagnosis not present

## 2020-01-11 DIAGNOSIS — Z89029 Acquired absence of unspecified finger(s): Secondary | ICD-10-CM | POA: Diagnosis not present

## 2020-01-11 DIAGNOSIS — E876 Hypokalemia: Secondary | ICD-10-CM | POA: Diagnosis not present

## 2020-01-11 DIAGNOSIS — Z96652 Presence of left artificial knee joint: Secondary | ICD-10-CM | POA: Diagnosis not present

## 2020-01-11 DIAGNOSIS — I739 Peripheral vascular disease, unspecified: Secondary | ICD-10-CM | POA: Diagnosis not present

## 2020-01-11 DIAGNOSIS — K589 Irritable bowel syndrome without diarrhea: Secondary | ICD-10-CM | POA: Diagnosis not present

## 2020-01-11 DIAGNOSIS — I209 Angina pectoris, unspecified: Secondary | ICD-10-CM | POA: Diagnosis not present

## 2020-01-11 DIAGNOSIS — F17210 Nicotine dependence, cigarettes, uncomplicated: Secondary | ICD-10-CM | POA: Diagnosis not present

## 2020-01-11 DIAGNOSIS — I7300 Raynaud's syndrome without gangrene: Secondary | ICD-10-CM | POA: Diagnosis not present

## 2020-01-11 DIAGNOSIS — M109 Gout, unspecified: Secondary | ICD-10-CM | POA: Diagnosis not present

## 2020-01-11 DIAGNOSIS — Z471 Aftercare following joint replacement surgery: Secondary | ICD-10-CM | POA: Diagnosis not present

## 2020-01-11 DIAGNOSIS — N3281 Overactive bladder: Secondary | ICD-10-CM | POA: Diagnosis not present

## 2020-01-11 DIAGNOSIS — G43909 Migraine, unspecified, not intractable, without status migrainosus: Secondary | ICD-10-CM | POA: Diagnosis not present

## 2020-01-11 DIAGNOSIS — E7800 Pure hypercholesterolemia, unspecified: Secondary | ICD-10-CM | POA: Diagnosis not present

## 2020-01-11 NOTE — Telephone Encounter (Signed)
7 day Ortho bundle call completed. 

## 2020-01-14 DIAGNOSIS — M329 Systemic lupus erythematosus, unspecified: Secondary | ICD-10-CM | POA: Diagnosis not present

## 2020-01-14 DIAGNOSIS — M1711 Unilateral primary osteoarthritis, right knee: Secondary | ICD-10-CM | POA: Diagnosis not present

## 2020-01-14 DIAGNOSIS — K760 Fatty (change of) liver, not elsewhere classified: Secondary | ICD-10-CM | POA: Diagnosis not present

## 2020-01-14 DIAGNOSIS — I209 Angina pectoris, unspecified: Secondary | ICD-10-CM | POA: Diagnosis not present

## 2020-01-14 DIAGNOSIS — K589 Irritable bowel syndrome without diarrhea: Secondary | ICD-10-CM | POA: Diagnosis not present

## 2020-01-14 DIAGNOSIS — R634 Abnormal weight loss: Secondary | ICD-10-CM | POA: Diagnosis not present

## 2020-01-14 DIAGNOSIS — N3281 Overactive bladder: Secondary | ICD-10-CM | POA: Diagnosis not present

## 2020-01-14 DIAGNOSIS — J45909 Unspecified asthma, uncomplicated: Secondary | ICD-10-CM | POA: Diagnosis not present

## 2020-01-14 DIAGNOSIS — G43909 Migraine, unspecified, not intractable, without status migrainosus: Secondary | ICD-10-CM | POA: Diagnosis not present

## 2020-01-14 DIAGNOSIS — F17210 Nicotine dependence, cigarettes, uncomplicated: Secondary | ICD-10-CM | POA: Diagnosis not present

## 2020-01-14 DIAGNOSIS — F32A Depression, unspecified: Secondary | ICD-10-CM | POA: Diagnosis not present

## 2020-01-14 DIAGNOSIS — M109 Gout, unspecified: Secondary | ICD-10-CM | POA: Diagnosis not present

## 2020-01-14 DIAGNOSIS — Z89029 Acquired absence of unspecified finger(s): Secondary | ICD-10-CM | POA: Diagnosis not present

## 2020-01-14 DIAGNOSIS — Z471 Aftercare following joint replacement surgery: Secondary | ICD-10-CM | POA: Diagnosis not present

## 2020-01-14 DIAGNOSIS — K219 Gastro-esophageal reflux disease without esophagitis: Secondary | ICD-10-CM | POA: Diagnosis not present

## 2020-01-14 DIAGNOSIS — E7800 Pure hypercholesterolemia, unspecified: Secondary | ICD-10-CM | POA: Diagnosis not present

## 2020-01-14 DIAGNOSIS — I7300 Raynaud's syndrome without gangrene: Secondary | ICD-10-CM | POA: Diagnosis not present

## 2020-01-14 DIAGNOSIS — I10 Essential (primary) hypertension: Secondary | ICD-10-CM | POA: Diagnosis not present

## 2020-01-14 DIAGNOSIS — Z96652 Presence of left artificial knee joint: Secondary | ICD-10-CM | POA: Diagnosis not present

## 2020-01-14 DIAGNOSIS — E876 Hypokalemia: Secondary | ICD-10-CM | POA: Diagnosis not present

## 2020-01-14 DIAGNOSIS — I739 Peripheral vascular disease, unspecified: Secondary | ICD-10-CM | POA: Diagnosis not present

## 2020-01-16 DIAGNOSIS — E876 Hypokalemia: Secondary | ICD-10-CM | POA: Diagnosis not present

## 2020-01-16 DIAGNOSIS — M1711 Unilateral primary osteoarthritis, right knee: Secondary | ICD-10-CM | POA: Diagnosis not present

## 2020-01-16 DIAGNOSIS — J45909 Unspecified asthma, uncomplicated: Secondary | ICD-10-CM | POA: Diagnosis not present

## 2020-01-16 DIAGNOSIS — M109 Gout, unspecified: Secondary | ICD-10-CM | POA: Diagnosis not present

## 2020-01-16 DIAGNOSIS — R634 Abnormal weight loss: Secondary | ICD-10-CM | POA: Diagnosis not present

## 2020-01-16 DIAGNOSIS — I739 Peripheral vascular disease, unspecified: Secondary | ICD-10-CM | POA: Diagnosis not present

## 2020-01-16 DIAGNOSIS — N3281 Overactive bladder: Secondary | ICD-10-CM | POA: Diagnosis not present

## 2020-01-16 DIAGNOSIS — E7800 Pure hypercholesterolemia, unspecified: Secondary | ICD-10-CM | POA: Diagnosis not present

## 2020-01-16 DIAGNOSIS — Z89029 Acquired absence of unspecified finger(s): Secondary | ICD-10-CM | POA: Diagnosis not present

## 2020-01-16 DIAGNOSIS — M329 Systemic lupus erythematosus, unspecified: Secondary | ICD-10-CM | POA: Diagnosis not present

## 2020-01-16 DIAGNOSIS — I7300 Raynaud's syndrome without gangrene: Secondary | ICD-10-CM | POA: Diagnosis not present

## 2020-01-16 DIAGNOSIS — Z96652 Presence of left artificial knee joint: Secondary | ICD-10-CM | POA: Diagnosis not present

## 2020-01-16 DIAGNOSIS — I10 Essential (primary) hypertension: Secondary | ICD-10-CM | POA: Diagnosis not present

## 2020-01-16 DIAGNOSIS — F32A Depression, unspecified: Secondary | ICD-10-CM | POA: Diagnosis not present

## 2020-01-16 DIAGNOSIS — F17210 Nicotine dependence, cigarettes, uncomplicated: Secondary | ICD-10-CM | POA: Diagnosis not present

## 2020-01-16 DIAGNOSIS — I209 Angina pectoris, unspecified: Secondary | ICD-10-CM | POA: Diagnosis not present

## 2020-01-16 DIAGNOSIS — K589 Irritable bowel syndrome without diarrhea: Secondary | ICD-10-CM | POA: Diagnosis not present

## 2020-01-16 DIAGNOSIS — G43909 Migraine, unspecified, not intractable, without status migrainosus: Secondary | ICD-10-CM | POA: Diagnosis not present

## 2020-01-16 DIAGNOSIS — K219 Gastro-esophageal reflux disease without esophagitis: Secondary | ICD-10-CM | POA: Diagnosis not present

## 2020-01-16 DIAGNOSIS — Z471 Aftercare following joint replacement surgery: Secondary | ICD-10-CM | POA: Diagnosis not present

## 2020-01-16 DIAGNOSIS — K760 Fatty (change of) liver, not elsewhere classified: Secondary | ICD-10-CM | POA: Diagnosis not present

## 2020-01-17 ENCOUNTER — Encounter: Payer: Self-pay | Admitting: Orthopaedic Surgery

## 2020-01-17 ENCOUNTER — Ambulatory Visit (INDEPENDENT_AMBULATORY_CARE_PROVIDER_SITE_OTHER): Payer: Medicare Other | Admitting: Orthopaedic Surgery

## 2020-01-17 ENCOUNTER — Telehealth: Payer: Self-pay | Admitting: *Deleted

## 2020-01-17 DIAGNOSIS — Z96652 Presence of left artificial knee joint: Secondary | ICD-10-CM

## 2020-01-17 MED ORDER — OXYCODONE HCL 5 MG PO TABS
5.0000 mg | ORAL_TABLET | ORAL | 0 refills | Status: DC | PRN
Start: 2020-01-17 — End: 2020-02-12

## 2020-01-17 NOTE — Progress Notes (Signed)
The patient is following up 2 weeks tomorrow after a left total knee arthroplasty.  His knee was severely deformed as is her right knee.  This left knee has done well.  He does have some bloody drainage but overall looks good.  Is much straighter.  Her range of motion is even improved to what she was preop.  She did let me know she has physical therapy as an outpatient coming up this coming Monday morning.  Examination of her knee we removed the staples were going to the inferior ones and for at least 6 more days.  We have Steri-Strips over the knee in general and I put a new Aquacel dressing on the knees to stay in place until she comes back to see me in 6 days.  All question concerns were answered and addressed.  I will send in some of her pain medication as well.

## 2020-01-17 NOTE — Telephone Encounter (Signed)
Ortho bundle 14 day in office visit completed. See Ortho flowsheet  For details.

## 2020-01-18 ENCOUNTER — Telehealth: Payer: Self-pay | Admitting: *Deleted

## 2020-01-18 NOTE — Telephone Encounter (Signed)
Ortho bundle call . 

## 2020-01-18 NOTE — Care Plan (Signed)
RNCM faxed all orders for OPPT to [facility] in [place]. Staff indicated that they still have patient scheduled to begin OPPT on Monday at 8:00 am, but that with patient having [facility] and Medicaid, she would have a copay of 20%. RNCM call to [facility] and they do accept insurances without a copay at their office. Fax to them with all notes and requested to schedule there ASAP. Attempted to contact patient, but there is no answer and no VM set up. Will continue to try.

## 2020-01-18 NOTE — Telephone Encounter (Signed)
Was able to reach patient and she was made aware of why her OPPT location needed to change. She is scheduled with [facility] on Tuesday at 8:00 am.

## 2020-01-23 ENCOUNTER — Other Ambulatory Visit: Payer: Self-pay | Admitting: Physician Assistant

## 2020-01-23 ENCOUNTER — Encounter (HOSPITAL_COMMUNITY): Payer: Self-pay | Admitting: Orthopaedic Surgery

## 2020-01-23 ENCOUNTER — Ambulatory Visit (INDEPENDENT_AMBULATORY_CARE_PROVIDER_SITE_OTHER): Payer: Medicare Other | Admitting: Physician Assistant

## 2020-01-23 ENCOUNTER — Other Ambulatory Visit: Payer: Self-pay

## 2020-01-23 DIAGNOSIS — T8459XA Infection and inflammatory reaction due to other internal joint prosthesis, initial encounter: Secondary | ICD-10-CM

## 2020-01-23 DIAGNOSIS — Z96659 Presence of unspecified artificial knee joint: Secondary | ICD-10-CM | POA: Diagnosis not present

## 2020-01-23 DIAGNOSIS — I742 Embolism and thrombosis of arteries of the upper extremities: Secondary | ICD-10-CM | POA: Insufficient documentation

## 2020-01-23 DIAGNOSIS — L98494 Non-pressure chronic ulcer of skin of other sites with necrosis of bone: Secondary | ICD-10-CM | POA: Insufficient documentation

## 2020-01-23 DIAGNOSIS — Z96652 Presence of left artificial knee joint: Secondary | ICD-10-CM | POA: Diagnosis not present

## 2020-01-23 MED ORDER — DOXYCYCLINE HYCLATE 100 MG PO TABS: 100.0000 mg | ORAL_TABLET | Freq: Two times a day (BID) | ORAL | 0 refills | Status: DC

## 2020-01-23 NOTE — Progress Notes (Signed)
Office Visit Note   Patient: Crystal Patterson           Date of Birth: 1965/03/29           MRN: [id number] Visit Date: 01/23/2020              Requested by: Avon Gully, MD [address] [place],  [place] [address] PCP: Avon Gully, MD   Assessment & Plan: Visit Diagnoses:  1. Infection of total knee replacement, initial encounter [facility])     Plan: We will send the aspirate off for culture, sensitivity and Gram stain.  She is placed on doxycycline 100 mg twice daily.  Mupirocin ointment was placed over the incision and a clean dressing was applied with Ace bandage.  She will leave this on until surgery.  Recommend left knee irrigation debridement and revision with polyexchange.  Questions were encouraged and answered by Dr. Magnus Ivan and myself today.  Follow-Up Instructions: No follow-ups on file.   Orders:  No orders of the defined types were placed in this encounter.  Meds ordered this encounter  Medications  . doxycycline (VIBRA-TABS) 100 MG tablet    Sig: Take 1 tablet (100 mg total) by mouth 2 (two) times daily for 14 days.    Dispense:  28 tablet    Refill:  0      Procedures: No procedures performed   Clinical Data: No additional findings.   Subjective: Chief Complaint  Patient presents with  . Left Knee - Routine Post Op    HPI Crystal Patterson comes in today now 3 weeks status post left total knee arthroplasty.  She continues to have drainage from the wound.  She denies any fevers chills.  She has been on no antibiotics.  She is here today mainly for wound check.  Review of Systems See HPI otherwise negative  Objective: Vital Signs: There were no vitals taken for this visit.  Physical Exam Constitutional:      Appearance: She is not ill-appearing or diaphoretic.  Pulmonary:     Effort: Pulmonary effort is normal.  Neurological:     Mental Status: She is alert and oriented to person, place, and time.  Psychiatric:        Behavior: Behavior  normal.     Ortho Exam Left knee malodorous drainage from the distal incision.  Positive effusion and slight abnormal warmth.  Staples were removed from the most distal wound.  This area appears slightly macerated.  Left knee is prepped with Betadine and then approximately 35 cc of bloody purulent aspirate is obtained.  Patient tolerates well. Specialty Comments:  No specialty comments available.  Imaging: No results found.   PMFS History: Patient Active Problem List   Diagnosis Date Noted  . Status post total knee replacement 01/06/2020  . Status post total left knee replacement 01/06/2020  . Status post total knee replacement, left 01/04/2020  . Unilateral primary osteoarthritis, left knee 09/26/2019  . Unilateral primary osteoarthritis, right knee 09/26/2019  . Systemic lupus erythematosus arthritis (HCC) 09/12/2019  . Arthritis of both knees 09/12/2019  . Syncope 12/06/2016  . ETOH abuse 12/06/2016  . PAD (peripheral artery disease) (HCC) 12/06/2016  . LVH (left ventricular hypertrophy) 05/26/2016  . Elevated troponin 05/24/2016  . HYPOKALEMIA 08/19/2008  . FATTY LIVER DISEASE 05/09/2008  . WEIGHT LOSS 03/28/2008  . RAYNAUDS SYNDROME 02/15/2008  . CARDIAC MURMUR 11/16/2007  . Systemic lupus erythematosus (HCC) 05/25/2006  . GOUT 02/02/2006  . DEPRESSION 02/02/2006  . MIGRAINE  HEADACHE 02/02/2006  . Essential hypertension 02/02/2006  . ALLERGIC RHINITIS 02/02/2006  . Asthma 02/02/2006  . GERD 02/02/2006  . IBS 02/02/2006  . OVERACTIVE BLADDER 02/02/2006   Past Medical History:  Diagnosis Date  . Anginal pain (HCC) 2020  . Arthritis    Knees, hips,  . Elevated troponin 05/24/2016  . Finger amputation, no complication   . Heart murmur   . Hypercholesteremia   . Hypertension   . Lupus (HCC)     Family History  Problem Relation Age of Onset  . Pancreatic cancer Mother   . Colon cancer Father     Past Surgical History:  Procedure Laterality Date  . CARDIAC  CATHETERIZATION    . COLONOSCOPY N/A 01/17/2019   Procedure: COLONOSCOPY;  Surgeon: Corbin Ade, MD;  Location: [facility]ITE;  Service: Endoscopy;  Laterality: N/A;  10:30  . left finger amputations    . POLYPECTOMY  01/17/2019   Procedure: POLYPECTOMY;  Surgeon: Corbin Ade, MD;  Location: [facility] SUITE;  Service: Endoscopy;;  . right hand surgery    . right knee arthroscopy    . RIGHT/LEFT HEART CATH AND CORONARY ANGIOGRAPHY N/A 05/25/2016   Procedure: Right/Left Heart Cath and Coronary Angiography;  Surgeon: Lennette Bihari, MD;  Location: [facility] INVASIVE CV LAB;  Service: Cardiovascular;  Laterality: N/A;  . TOTAL KNEE ARTHROPLASTY Left 01/04/2020   Procedure: LEFT TOTAL KNEE ARTHROPLASTY;  Surgeon: [name], MD;  Location: [facility] ORS;  Service: Orthopedics;  Laterality: Left;   Social History   Occupational History  . Occupation: unemployed  Tobacco Use  . Smoking status: Current Every Day Smoker    Packs/day: 0.25    Years: 15.00    Pack years: 3.75    Types: Cigarettes  . Smokeless tobacco: Never Used  Vaping Use  . Vaping Use: Never used  Substance and Sexual Activity  . Alcohol use: Yes    Alcohol/week: 3.0 standard drinks    Types: 3 Cans of beer per week  . Drug use: Yes    Types: Marijuana    Comment: l  . Sexual activity: Not Currently

## 2020-01-23 NOTE — Progress Notes (Addendum)
COVID Vaccine Completed: Yes Date COVID Vaccine completed: 08/11/19 COVID vaccine manufacturer:     Moderna     PCP - Dr. Avon Gully Cardiologist - Dr. Dina Rich pre op exam 01/02/20 in epic  Chest x-ray - greater than 1 year in epic EKG - 01/02/20 in epic Stress Test - N/A ECHO - greater than 2 years 12/06/2016 in epic Cardiac Cath - greater than 2 years 3/27//2018 in epic Pacemaker/ICD device last checked:N/A  Sleep Study - N/A CPAP - N/A  Fasting Blood Sugar - N/A Checks Blood Sugar __N/A___ times a day  Blood Thinner Instructions: N/A Aspirin Instructions: N/A Last Dose:N/A   Activity level: Can go up a flight of stairs without stopping and without symptoms        Anesthesia review: N/A  Patient denies shortness of breath, fever, cough and chest pain at PAT appointment   Patient verbalized understanding of instructions that were given to them at the PAT appointment. Patient was also instructed that they will need to review over the PAT instructions again at home before surgery.

## 2020-01-23 NOTE — Addendum Note (Signed)
Addended by: Shonna Chock on: 01/23/2020 02:51 PM   Modules accepted: Orders

## 2020-01-25 ENCOUNTER — Other Ambulatory Visit: Payer: Self-pay

## 2020-01-25 ENCOUNTER — Encounter (HOSPITAL_COMMUNITY): Payer: Self-pay | Admitting: Orthopaedic Surgery

## 2020-01-25 ENCOUNTER — Encounter (HOSPITAL_COMMUNITY)
Admission: RE | Disposition: A | Discharge status: Home Health | Payer: Self-pay | Source: Home / Self Care | Attending: Orthopaedic Surgery

## 2020-01-25 ENCOUNTER — Inpatient Hospital Stay: Payer: Self-pay

## 2020-01-25 ENCOUNTER — Inpatient Hospital Stay (HOSPITAL_COMMUNITY): Payer: Medicare Other | Admitting: Certified Registered Nurse Anesthetist

## 2020-01-25 ENCOUNTER — Inpatient Hospital Stay (HOSPITAL_COMMUNITY)
Admission: RE | Admit: 2020-01-25 | Discharge: 2020-02-12 | DRG: 464 | Disposition: A | Discharge status: Home Health | Payer: Medicare Other | Attending: Orthopaedic Surgery | Admitting: Orthopaedic Surgery

## 2020-01-25 DIAGNOSIS — D849 Immunodeficiency, unspecified: Secondary | ICD-10-CM | POA: Diagnosis present

## 2020-01-25 DIAGNOSIS — Z96652 Presence of left artificial knee joint: Secondary | ICD-10-CM

## 2020-01-25 DIAGNOSIS — T8454XD Infection and inflammatory reaction due to internal left knee prosthesis, subsequent encounter: Secondary | ICD-10-CM

## 2020-01-25 DIAGNOSIS — M329 Systemic lupus erythematosus, unspecified: Secondary | ICD-10-CM | POA: Diagnosis not present

## 2020-01-25 DIAGNOSIS — E785 Hyperlipidemia, unspecified: Secondary | ICD-10-CM | POA: Diagnosis not present

## 2020-01-25 DIAGNOSIS — F17210 Nicotine dependence, cigarettes, uncomplicated: Secondary | ICD-10-CM | POA: Diagnosis present

## 2020-01-25 DIAGNOSIS — S81002S Unspecified open wound, left knee, sequela: Secondary | ICD-10-CM

## 2020-01-25 DIAGNOSIS — T8454XA Infection and inflammatory reaction due to internal left knee prosthesis, initial encounter: Secondary | ICD-10-CM

## 2020-01-25 DIAGNOSIS — Z96659 Presence of unspecified artificial knee joint: Secondary | ICD-10-CM

## 2020-01-25 DIAGNOSIS — M109 Gout, unspecified: Secondary | ICD-10-CM | POA: Diagnosis not present

## 2020-01-25 DIAGNOSIS — I2510 Atherosclerotic heart disease of native coronary artery without angina pectoris: Secondary | ICD-10-CM | POA: Diagnosis present

## 2020-01-25 DIAGNOSIS — I10 Essential (primary) hypertension: Secondary | ICD-10-CM | POA: Diagnosis not present

## 2020-01-25 DIAGNOSIS — B9620 Unspecified Escherichia coli [E. coli] as the cause of diseases classified elsewhere: Secondary | ICD-10-CM | POA: Diagnosis present

## 2020-01-25 DIAGNOSIS — D62 Acute posthemorrhagic anemia: Secondary | ICD-10-CM | POA: Diagnosis present

## 2020-01-25 DIAGNOSIS — Z800 Family history of malignant neoplasm of digestive organs: Secondary | ICD-10-CM | POA: Diagnosis not present

## 2020-01-25 DIAGNOSIS — G8918 Other acute postprocedural pain: Secondary | ICD-10-CM | POA: Diagnosis not present

## 2020-01-25 DIAGNOSIS — Z89029 Acquired absence of unspecified finger(s): Secondary | ICD-10-CM | POA: Diagnosis not present

## 2020-01-25 DIAGNOSIS — I7300 Raynaud's syndrome without gangrene: Secondary | ICD-10-CM | POA: Diagnosis not present

## 2020-01-25 DIAGNOSIS — E876 Hypokalemia: Secondary | ICD-10-CM | POA: Diagnosis present

## 2020-01-25 DIAGNOSIS — Y831 Surgical operation with implant of artificial internal device as the cause of abnormal reaction of the patient, or of later complication, without mention of misadventure at the time of the procedure: Secondary | ICD-10-CM | POA: Diagnosis present

## 2020-01-25 DIAGNOSIS — Z7982 Long term (current) use of aspirin: Secondary | ICD-10-CM

## 2020-01-25 DIAGNOSIS — I739 Peripheral vascular disease, unspecified: Secondary | ICD-10-CM | POA: Diagnosis not present

## 2020-01-25 DIAGNOSIS — K219 Gastro-esophageal reflux disease without esophagitis: Secondary | ICD-10-CM | POA: Diagnosis not present

## 2020-01-25 DIAGNOSIS — B964 Proteus (mirabilis) (morganii) as the cause of diseases classified elsewhere: Secondary | ICD-10-CM | POA: Diagnosis not present

## 2020-01-25 DIAGNOSIS — L7682 Other postprocedural complications of skin and subcutaneous tissue: Secondary | ICD-10-CM | POA: Diagnosis not present

## 2020-01-25 DIAGNOSIS — T8459XA Infection and inflammatory reaction due to other internal joint prosthesis, initial encounter: Secondary | ICD-10-CM

## 2020-01-25 DIAGNOSIS — I96 Gangrene, not elsewhere classified: Secondary | ICD-10-CM | POA: Diagnosis not present

## 2020-01-25 DIAGNOSIS — Z20822 Contact with and (suspected) exposure to covid-19: Secondary | ICD-10-CM | POA: Diagnosis not present

## 2020-01-25 DIAGNOSIS — E7800 Pure hypercholesterolemia, unspecified: Secondary | ICD-10-CM | POA: Diagnosis not present

## 2020-01-25 DIAGNOSIS — Z79899 Other long term (current) drug therapy: Secondary | ICD-10-CM | POA: Diagnosis not present

## 2020-01-25 HISTORY — DX: Infection and inflammatory reaction due to internal left knee prosthesis, initial encounter: T84.54XA

## 2020-01-25 HISTORY — PX: I & D KNEE WITH POLY EXCHANGE: SHX5024

## 2020-01-25 LAB — CBC
HCT: 24.1 % — ABNORMAL LOW (ref 36.0–46.0)
Hemoglobin: 7.4 g/dL — ABNORMAL LOW (ref 12.0–15.0)
MCH: 31.1 pg (ref 26.0–34.0)
MCHC: 30.7 g/dL (ref 30.0–36.0)
MCV: 101.3 fL — ABNORMAL HIGH (ref 80.0–100.0)
Platelets: 211 10*3/uL (ref 150–400)
RBC: 2.38 MIL/uL — ABNORMAL LOW (ref 3.87–5.11)
RDW: 14.4 % (ref 11.5–15.5)
WBC: 7 10*3/uL (ref 4.0–10.5)
nRBC: 0 % (ref 0.0–0.2)

## 2020-01-25 LAB — SYNOVIAL FLUID ANALYSIS, COMPLETE
Basophils, %: 0 %
Eosinophils-Synovial: 0 % (ref 0–2)
Lymphocytes-Synovial Fld: 16 % (ref 0–74)
Monocyte/Macrophage: 4 % (ref 0–69)
Neutrophil, Synovial: 80 % — ABNORMAL HIGH (ref 0–24)
Synoviocytes, %: 0 % (ref 0–15)
WBC, Synovial: 9083 cells/uL — ABNORMAL HIGH (ref <150)

## 2020-01-25 LAB — SURGICAL PCR SCREEN
MRSA, PCR: NEGATIVE
Staphylococcus aureus: NEGATIVE

## 2020-01-25 LAB — PREPARE RBC (CROSSMATCH)

## 2020-01-25 LAB — GRAM STAIN
MICRO NUMBER:: 11244390
SPECIMEN QUALITY:: ADEQUATE

## 2020-01-25 LAB — RESP PANEL BY RT-PCR (FLU A&B, COVID) ARPGX2
Influenza A by PCR: NEGATIVE
Influenza B by PCR: NEGATIVE
SARS Coronavirus 2 by RT PCR: NEGATIVE

## 2020-01-25 LAB — SAMPLE TO BLOOD BANK

## 2020-01-25 SURGERY — IRRIGATION AND DEBRIDEMENT KNEE WITH POLY EXCHANGE
Anesthesia: Monitor Anesthesia Care | Site: Knee | Laterality: Left

## 2020-01-25 MED ORDER — OXYCODONE HCL 5 MG PO TABS
5.0000 mg | ORAL_TABLET | ORAL | Status: DC | PRN
  Administered 2020-01-25 – 2020-02-02 (×5): 5 mg via ORAL
  Administered 2020-02-03 – 2020-02-09 (×9): 10 mg via ORAL
  Filled 2020-01-25 (×4): qty 2
  Filled 2020-01-25: qty 1
  Filled 2020-01-25: qty 2
  Filled 2020-01-25 (×2): qty 1
  Filled 2020-01-25 (×8): qty 2
  Filled 2020-01-25: qty 1
  Filled 2020-01-25 (×4): qty 2
  Filled 2020-01-25: qty 1
  Filled 2020-01-25 (×3): qty 2

## 2020-01-25 MED ORDER — PROPOFOL 500 MG/50ML IV EMUL
INTRAVENOUS | Status: DC | PRN
  Administered 2020-01-25 (×3): 20 mg via INTRAVENOUS

## 2020-01-25 MED ORDER — DEXAMETHASONE SODIUM PHOSPHATE 10 MG/ML IJ SOLN
INTRAMUSCULAR | Status: AC
  Filled 2020-01-25: qty 1

## 2020-01-25 MED ORDER — VANCOMYCIN HCL 1000 MG IV SOLR: 1000.0000 mg | Freq: Two times a day (BID) | INTRAVENOUS | Status: DC

## 2020-01-25 MED ORDER — PHENYLEPHRINE HCL (PRESSORS) 10 MG/ML IV SOLN
INTRAVENOUS | Status: AC
  Filled 2020-01-25: qty 1

## 2020-01-25 MED ORDER — VANCOMYCIN HCL 750 MG/150ML IV SOLN
750.0000 mg | Freq: Two times a day (BID) | INTRAVENOUS | Status: DC
  Administered 2020-01-25 – 2020-01-27 (×4): 750 mg via INTRAVENOUS
  Filled 2020-01-25 (×4): qty 150

## 2020-01-25 MED ORDER — HYDROMORPHONE HCL 1 MG/ML IJ SOLN
0.5000 mg | INTRAMUSCULAR | Status: DC | PRN
  Administered 2020-01-25 – 2020-02-05 (×20): 1 mg via INTRAVENOUS
  Administered 2020-02-07: 0.5 mg via INTRAVENOUS
  Administered 2020-02-08 – 2020-02-11 (×3): 1 mg via INTRAVENOUS
  Filled 2020-01-25 (×25): qty 1

## 2020-01-25 MED ORDER — LACTATED RINGERS IV SOLN: INTRAVENOUS | Status: DC | PRN

## 2020-01-25 MED ORDER — ASPIRIN 81 MG PO CHEW
81.0000 mg | CHEWABLE_TABLET | Freq: Two times a day (BID) | ORAL | Status: DC
  Administered 2020-01-25 – 2020-02-12 (×33): 81 mg via ORAL
  Filled 2020-01-25 (×34): qty 1

## 2020-01-25 MED ORDER — LIDOCAINE-PRILOCAINE 2.5-2.5 % EX CREA
TOPICAL_CREAM | CUTANEOUS | Status: DC | PRN
  Filled 2020-01-25: qty 5

## 2020-01-25 MED ORDER — OXYCODONE HCL 5 MG PO TABS
10.0000 mg | ORAL_TABLET | ORAL | Status: DC | PRN
  Administered 2020-01-25: 10 mg via ORAL
  Administered 2020-01-26 (×4): 15 mg via ORAL
  Administered 2020-01-27 (×2): 10 mg via ORAL
  Administered 2020-01-27: 15 mg via ORAL
  Administered 2020-01-27: 10 mg via ORAL
  Administered 2020-01-28 – 2020-02-07 (×25): 15 mg via ORAL
  Administered 2020-02-07 – 2020-02-08 (×6): 10 mg via ORAL
  Administered 2020-02-08: 15 mg via ORAL
  Administered 2020-02-10 (×3): 10 mg via ORAL
  Administered 2020-02-10: 15 mg via ORAL
  Administered 2020-02-11 – 2020-02-12 (×6): 10 mg via ORAL
  Filled 2020-01-25 (×3): qty 3
  Filled 2020-01-25: qty 2
  Filled 2020-01-25 (×4): qty 3
  Filled 2020-01-25: qty 2
  Filled 2020-01-25: qty 3
  Filled 2020-01-25 (×2): qty 2
  Filled 2020-01-25: qty 3
  Filled 2020-01-25: qty 2
  Filled 2020-01-25 (×14): qty 3
  Filled 2020-01-25: qty 2
  Filled 2020-01-25: qty 3
  Filled 2020-01-25: qty 2
  Filled 2020-01-25 (×11): qty 3

## 2020-01-25 MED ORDER — LACTATED RINGERS IV SOLN: Freq: Once | INTRAVENOUS | Status: AC

## 2020-01-25 MED ORDER — SODIUM CHLORIDE 0.9 % IR SOLN
Status: DC | PRN
  Administered 2020-01-25: 6000 mL

## 2020-01-25 MED ORDER — POTASSIUM CHLORIDE ER 10 MEQ PO TBCR: 10.0000 meq | EXTENDED_RELEASE_TABLET | Freq: Every day | ORAL | Status: DC

## 2020-01-25 MED ORDER — ALBUMIN HUMAN 5 % IV SOLN: INTRAVENOUS | Status: DC | PRN

## 2020-01-25 MED ORDER — ROCURONIUM BROMIDE 10 MG/ML (PF) SYRINGE
PREFILLED_SYRINGE | INTRAVENOUS | Status: AC
  Filled 2020-01-25: qty 10

## 2020-01-25 MED ORDER — PIPERACILLIN-TAZOBACTAM 3.375 G IVPB
3.3750 g | Freq: Three times a day (TID) | INTRAVENOUS | Status: DC
  Administered 2020-01-25: 3.375 g via INTRAVENOUS
  Filled 2020-01-25 (×2): qty 50

## 2020-01-25 MED ORDER — EPHEDRINE 5 MG/ML INJ
INTRAVENOUS | Status: AC
  Filled 2020-01-25: qty 10

## 2020-01-25 MED ORDER — ALLOPURINOL 100 MG PO TABS
100.0000 mg | ORAL_TABLET | Freq: Every day | ORAL | Status: DC
  Administered 2020-01-26 – 2020-02-12 (×15): 100 mg via ORAL
  Filled 2020-01-25 (×18): qty 1

## 2020-01-25 MED ORDER — SPIRONOLACTONE 25 MG PO TABS
25.0000 mg | ORAL_TABLET | Freq: Every day | ORAL | Status: DC
  Administered 2020-01-26 – 2020-02-12 (×14): 25 mg via ORAL
  Filled 2020-01-25 (×17): qty 1

## 2020-01-25 MED ORDER — SODIUM CHLORIDE 0.9 % IV SOLN: INTRAVENOUS | Status: DC

## 2020-01-25 MED ORDER — VANCOMYCIN HCL IN DEXTROSE 1-5 GM/200ML-% IV SOLN
INTRAVENOUS | Status: AC
  Filled 2020-01-25: qty 200

## 2020-01-25 MED ORDER — ONDANSETRON HCL 4 MG/2ML IJ SOLN: 4.0000 mg | Freq: Four times a day (QID) | INTRAMUSCULAR | Status: DC | PRN

## 2020-01-25 MED ORDER — SODIUM CHLORIDE 0.9% IV SOLUTION: Freq: Once | INTRAVENOUS | Status: AC

## 2020-01-25 MED ORDER — GABAPENTIN 300 MG PO CAPS
300.0000 mg | ORAL_CAPSULE | Freq: Every day | ORAL | Status: DC
  Administered 2020-01-25 – 2020-02-12 (×17): 300 mg via ORAL
  Filled 2020-01-25 (×17): qty 1

## 2020-01-25 MED ORDER — ONDANSETRON HCL 4 MG PO TABS: 4.0000 mg | ORAL_TABLET | Freq: Four times a day (QID) | ORAL | Status: DC | PRN

## 2020-01-25 MED ORDER — POTASSIUM CHLORIDE CRYS ER 10 MEQ PO TBCR
10.0000 meq | EXTENDED_RELEASE_TABLET | Freq: Every day | ORAL | Status: DC
  Administered 2020-01-25 – 2020-02-03 (×9): 10 meq via ORAL
  Filled 2020-01-25 (×10): qty 1

## 2020-01-25 MED ORDER — ACETAMINOPHEN 325 MG PO TABS
325.0000 mg | ORAL_TABLET | Freq: Four times a day (QID) | ORAL | Status: DC | PRN
  Administered 2020-01-27 – 2020-02-03 (×2): 650 mg via ORAL
  Filled 2020-01-25 (×2): qty 2

## 2020-01-25 MED ORDER — PHENYLEPHRINE HCL-NACL 10-0.9 MG/250ML-% IV SOLN
INTRAVENOUS | Status: DC | PRN
  Administered 2020-01-25: 50 ug/min via INTRAVENOUS

## 2020-01-25 MED ORDER — DEXAMETHASONE SODIUM PHOSPHATE 10 MG/ML IJ SOLN
INTRAMUSCULAR | Status: DC | PRN
  Administered 2020-01-25: 5 mg via INTRAVENOUS

## 2020-01-25 MED ORDER — CHLORHEXIDINE GLUCONATE 0.12 % MT SOLN
15.0000 mL | OROMUCOSAL | Status: AC
  Administered 2020-01-25: 15 mL via OROMUCOSAL

## 2020-01-25 MED ORDER — AMLODIPINE BESYLATE 10 MG PO TABS
10.0000 mg | ORAL_TABLET | Freq: Every day | ORAL | Status: DC
  Administered 2020-01-26 – 2020-02-07 (×9): 10 mg via ORAL
  Filled 2020-01-25 (×12): qty 1

## 2020-01-25 MED ORDER — ATENOLOL 25 MG PO TABS
25.0000 mg | ORAL_TABLET | Freq: Every day | ORAL | Status: DC
  Administered 2020-01-26 – 2020-02-11 (×14): 25 mg via ORAL
  Filled 2020-01-25 (×17): qty 1

## 2020-01-25 MED ORDER — DOCUSATE SODIUM 100 MG PO CAPS
100.0000 mg | ORAL_CAPSULE | Freq: Two times a day (BID) | ORAL | Status: DC
  Administered 2020-01-25 – 2020-02-12 (×33): 100 mg via ORAL
  Filled 2020-01-25 (×34): qty 1

## 2020-01-25 MED ORDER — PANTOPRAZOLE SODIUM 40 MG PO TBEC
40.0000 mg | DELAYED_RELEASE_TABLET | Freq: Every day | ORAL | Status: DC
  Administered 2020-01-26 – 2020-02-12 (×16): 40 mg via ORAL
  Filled 2020-01-25 (×17): qty 1

## 2020-01-25 MED ORDER — ALBUMIN HUMAN 5 % IV SOLN
INTRAVENOUS | Status: AC
  Filled 2020-01-25: qty 250

## 2020-01-25 MED ORDER — PROPOFOL 1000 MG/100ML IV EMUL
INTRAVENOUS | Status: AC
  Filled 2020-01-25: qty 100

## 2020-01-25 MED ORDER — FUROSEMIDE 40 MG PO TABS
40.0000 mg | ORAL_TABLET | Freq: Every day | ORAL | Status: DC
  Administered 2020-01-26 – 2020-02-12 (×15): 40 mg via ORAL
  Filled 2020-01-25 (×16): qty 1

## 2020-01-25 MED ORDER — POVIDONE-IODINE 10 % EX SWAB
2.0000 "application " | Freq: Once | CUTANEOUS | Status: AC
  Administered 2020-01-25: 2 via TOPICAL

## 2020-01-25 MED ORDER — ACETAMINOPHEN 10 MG/ML IV SOLN: 1000.0000 mg | Freq: Once | INTRAVENOUS | Status: DC | PRN

## 2020-01-25 MED ORDER — DIPHENHYDRAMINE HCL 12.5 MG/5ML PO ELIX
12.5000 mg | ORAL_SOLUTION | ORAL | Status: DC | PRN
  Administered 2020-01-26 – 2020-01-30 (×2): 25 mg via ORAL
  Filled 2020-01-25 (×2): qty 10

## 2020-01-25 MED ORDER — ONDANSETRON HCL 4 MG/2ML IJ SOLN
INTRAMUSCULAR | Status: AC
  Filled 2020-01-25: qty 2

## 2020-01-25 MED ORDER — METHOCARBAMOL 500 MG PO TABS
500.0000 mg | ORAL_TABLET | Freq: Four times a day (QID) | ORAL | Status: DC | PRN
  Administered 2020-01-25 – 2020-02-12 (×28): 500 mg via ORAL
  Filled 2020-01-25 (×28): qty 1

## 2020-01-25 MED ORDER — GLYCOPYRROLATE PF 0.2 MG/ML IJ SOSY
PREFILLED_SYRINGE | INTRAMUSCULAR | Status: AC
  Filled 2020-01-25: qty 1

## 2020-01-25 MED ORDER — BENAZEPRIL HCL 20 MG PO TABS
40.0000 mg | ORAL_TABLET | Freq: Every day | ORAL | Status: DC
  Administered 2020-01-26 – 2020-02-07 (×9): 40 mg via ORAL
  Filled 2020-01-25 (×12): qty 2

## 2020-01-25 MED ORDER — PHENYLEPHRINE 40 MCG/ML (10ML) SYRINGE FOR IV PUSH (FOR BLOOD PRESSURE SUPPORT)
PREFILLED_SYRINGE | INTRAVENOUS | Status: AC
  Filled 2020-01-25: qty 10

## 2020-01-25 MED ORDER — ROPIVACAINE HCL 7.5 MG/ML IJ SOLN
INTRAMUSCULAR | Status: DC | PRN
  Administered 2020-01-25: 20 mL via PERINEURAL

## 2020-01-25 MED ORDER — ESCITALOPRAM OXALATE 10 MG PO TABS
10.0000 mg | ORAL_TABLET | Freq: Every day | ORAL | Status: DC
  Administered 2020-01-25 – 2020-02-12 (×17): 10 mg via ORAL
  Filled 2020-01-25 (×17): qty 1

## 2020-01-25 MED ORDER — METHOCARBAMOL 500 MG IVPB - SIMPLE MED
500.0000 mg | Freq: Four times a day (QID) | INTRAVENOUS | Status: DC | PRN
  Administered 2020-01-30: 500 mg via INTRAVENOUS
  Filled 2020-01-25: qty 50

## 2020-01-25 MED ORDER — PROPOFOL 500 MG/50ML IV EMUL
INTRAVENOUS | Status: DC | PRN
  Administered 2020-01-25: 50 ug/kg/min via INTRAVENOUS

## 2020-01-25 MED ORDER — PIPERACILLIN-TAZOBACTAM 3.375 G IVPB
3.3750 g | Freq: Three times a day (TID) | INTRAVENOUS | Status: DC
  Administered 2020-01-26 – 2020-01-28 (×8): 3.375 g via INTRAVENOUS
  Filled 2020-01-25 (×8): qty 50

## 2020-01-25 MED ORDER — METOCLOPRAMIDE HCL 5 MG/ML IJ SOLN: 5.0000 mg | Freq: Three times a day (TID) | INTRAMUSCULAR | Status: DC | PRN

## 2020-01-25 MED ORDER — SUCCINYLCHOLINE CHLORIDE 200 MG/10ML IV SOSY
PREFILLED_SYRINGE | INTRAVENOUS | Status: AC
  Filled 2020-01-25: qty 10

## 2020-01-25 MED ORDER — VANCOMYCIN HCL IN DEXTROSE 1-5 GM/200ML-% IV SOLN
1000.0000 mg | Freq: Once | INTRAVENOUS | Status: AC
  Administered 2020-01-25: 1000 mg via INTRAVENOUS

## 2020-01-25 MED ORDER — BUPIVACAINE IN DEXTROSE 0.75-8.25 % IT SOLN
INTRATHECAL | Status: DC | PRN
  Administered 2020-01-25: 1.6 mL via INTRATHECAL

## 2020-01-25 MED ORDER — LIDOCAINE HCL (PF) 2 % IJ SOLN
INTRAMUSCULAR | Status: AC
  Filled 2020-01-25: qty 5

## 2020-01-25 MED ORDER — ALBUTEROL SULFATE HFA 108 (90 BASE) MCG/ACT IN AERS
2.0000 | INHALATION_SPRAY | Freq: Four times a day (QID) | RESPIRATORY_TRACT | Status: DC | PRN
  Filled 2020-01-25: qty 6.7

## 2020-01-25 MED ORDER — MIDAZOLAM HCL 2 MG/2ML IJ SOLN
1.0000 mg | INTRAMUSCULAR | Status: DC
  Filled 2020-01-25: qty 2

## 2020-01-25 MED ORDER — ATORVASTATIN CALCIUM 40 MG PO TABS
40.0000 mg | ORAL_TABLET | Freq: Every day | ORAL | Status: DC
  Administered 2020-01-25 – 2020-02-12 (×16): 40 mg via ORAL
  Filled 2020-01-25 (×17): qty 1

## 2020-01-25 MED ORDER — 0.9 % SODIUM CHLORIDE (POUR BTL) OPTIME
TOPICAL | Status: DC | PRN
  Administered 2020-01-25: 1000 mL

## 2020-01-25 MED ORDER — ONDANSETRON HCL 4 MG/2ML IJ SOLN
4.0000 mg | Freq: Once | INTRAMUSCULAR | Status: AC | PRN
  Administered 2020-01-25: 4 mg via INTRAVENOUS

## 2020-01-25 MED ORDER — METOCLOPRAMIDE HCL 5 MG PO TABS: 5.0000 mg | ORAL_TABLET | Freq: Three times a day (TID) | ORAL | Status: DC | PRN

## 2020-01-25 MED ORDER — HYDROMORPHONE HCL 1 MG/ML IJ SOLN: 0.2500 mg | INTRAMUSCULAR | Status: DC | PRN

## 2020-01-25 MED ORDER — ZOLPIDEM TARTRATE 5 MG PO TABS: 5.0000 mg | ORAL_TABLET | Freq: Every evening | ORAL | Status: DC | PRN

## 2020-01-25 MED ORDER — PROPOFOL 10 MG/ML IV BOLUS
INTRAVENOUS | Status: AC
  Filled 2020-01-25: qty 20

## 2020-01-25 MED ORDER — FENTANYL CITRATE (PF) 100 MCG/2ML IJ SOLN
50.0000 ug | INTRAMUSCULAR | Status: AC
  Administered 2020-01-25: 50 ug via INTRAVENOUS
  Filled 2020-01-25: qty 2

## 2020-01-25 SURGICAL SUPPLY — 43 items
BAG SPEC THK2 15X12 ZIP CLS (MISCELLANEOUS) ×1
BAG ZIPLOCK 12X15 (MISCELLANEOUS) ×2 IMPLANT
BEARING TIBIAL INSERT 13MM (Insert) IMPLANT
BNDG CMPR MED 10X6 ELC LF (GAUZE/BANDAGES/DRESSINGS) ×1
BNDG ELASTIC 4X5.8 VLCR STR LF (GAUZE/BANDAGES/DRESSINGS) ×2 IMPLANT
BNDG ELASTIC 6X10 VLCR STRL LF (GAUZE/BANDAGES/DRESSINGS) ×1 IMPLANT
BNDG ELASTIC 6X5.8 VLCR STR LF (GAUZE/BANDAGES/DRESSINGS) ×2 IMPLANT
COVER SURGICAL LIGHT HANDLE (MISCELLANEOUS) ×2 IMPLANT
COVER WAND RF STERILE (DRAPES) IMPLANT
CUFF TOURN SGL QUICK 34 (TOURNIQUET CUFF) ×2
CUFF TRNQT CYL 34X4.125X (TOURNIQUET CUFF) ×1 IMPLANT
DECANTER SPIKE VIAL GLASS SM (MISCELLANEOUS) ×2 IMPLANT
DRAPE U-SHAPE 47X51 STRL (DRAPES) ×2 IMPLANT
DRSG ADAPTIC 3X8 NADH LF (GAUZE/BANDAGES/DRESSINGS) ×2 IMPLANT
DRSG PAD ABDOMINAL 8X10 ST (GAUZE/BANDAGES/DRESSINGS) ×4 IMPLANT
ELECT REM PT RETURN 15FT ADLT (MISCELLANEOUS) ×2 IMPLANT
GAUZE SPONGE 4X4 12PLY STRL (GAUZE/BANDAGES/DRESSINGS) ×3 IMPLANT
GAUZE XEROFORM 1X8 LF (GAUZE/BANDAGES/DRESSINGS) ×1 IMPLANT
GLOVE BIO SURGEON STRL SZ7.5 (GLOVE) ×2 IMPLANT
GLOVE BIOGEL PI IND STRL 8 (GLOVE) ×2 IMPLANT
GLOVE BIOGEL PI INDICATOR 8 (GLOVE) ×2
GLOVE ECLIPSE 8.0 STRL XLNG CF (GLOVE) ×2 IMPLANT
GOWN STRL REUS W/TWL XL LVL3 (GOWN DISPOSABLE) ×4 IMPLANT
HANDPIECE INTERPULSE COAX TIP (DISPOSABLE) ×2
IMMOBILIZER KNEE 20 (SOFTGOODS)
IMMOBILIZER KNEE 20 THIGH 36 (SOFTGOODS) IMPLANT
KIT TURNOVER KIT A (KITS) IMPLANT
NS IRRIG 1000ML POUR BTL (IV SOLUTION) ×2 IMPLANT
PACK TOTAL KNEE CUSTOM (KITS) ×2 IMPLANT
PENCIL SMOKE EVACUATOR (MISCELLANEOUS) IMPLANT
PROTECTOR NERVE ULNAR (MISCELLANEOUS) ×2 IMPLANT
SET HNDPC FAN SPRY TIP SCT (DISPOSABLE) ×1 IMPLANT
STRIP CLOSURE SKIN 1/2X4 (GAUZE/BANDAGES/DRESSINGS) ×4 IMPLANT
SUT ETHIBOND NAB CT1 #1 30IN (SUTURE) ×2 IMPLANT
SUT MNCRL AB 4-0 PS2 18 (SUTURE) ×2 IMPLANT
SUT VIC AB 0 CT1 36 (SUTURE) ×2 IMPLANT
SUT VIC AB 1 CT1 36 (SUTURE) ×6 IMPLANT
SUT VIC AB 2-0 CT1 27 (SUTURE) ×6
SUT VIC AB 2-0 CT1 TAPERPNT 27 (SUTURE) ×3 IMPLANT
SWAB COLLECTION DEVICE MRSA (MISCELLANEOUS) ×2 IMPLANT
SWAB CULTURE ESWAB REG 1ML (MISCELLANEOUS) ×2 IMPLANT
TIBIAL BEARING INSERT 13MM (Insert) ×2 IMPLANT
TRAY FOLEY MTR SLVR 16FR STAT (SET/KITS/TRAYS/PACK) ×2 IMPLANT

## 2020-01-25 NOTE — Progress Notes (Signed)
Patient ID: Crystal Patterson, female   DOB: 04/25/1965, 54 y.o.   MRN: [id number] I came by the patient's bedside this afternoon to describe her with found in surgery with her left knee.  I was able to wash out the knee thoroughly and change out her polyliner.  The initial Gram stain does show gram-negative rods.  We have her on broad-spectrum antibiotics for now until we see the final culture with susceptibilities.  I will need to have her here throughout the weekend.  She has received at least 1 unit of blood due to her anemia that she had coming in to the hospital today.  There was not a lot of blood loss during surgery.  Tomorrow we will recheck her labs.  Hopefully by Monday we will have more of a disposition in terms of final antibiotics and duration of antibiotics.  I will likely consult infectious disease specialist on Monday.  The current dressing on her knee right now can stay on through the weekend.

## 2020-01-25 NOTE — Anesthesia Preprocedure Evaluation (Addendum)
Anesthesia Evaluation  Patient identified by MRN, date of birth, ID band Patient awake    Reviewed: Patient's Chart, lab work & pertinent test results  Airway Mallampati: II  TM Distance: >3 FB Neck ROM: Full    Dental  (+) Teeth Intact   Pulmonary Current Smoker,    Pulmonary exam normal        Cardiovascular hypertension, Pt. on medications and Pt. on home beta blockers + CAD   Rhythm:Regular Rate:Normal     Neuro/Psych  Headaches, Depression    GI/Hepatic Neg liver ROS, GERD  Medicated,  Endo/Other  negative endocrine ROS  Renal/GU      Musculoskeletal  (+) Arthritis , Left knee infection s/p TKR 11/21   Abdominal (+)  Abdomen: soft. Bowel sounds: normal.  Peds  Hematology negative hematology ROS (+)   Anesthesia Other Findings   Reproductive/Obstetrics                             Anesthesia Physical Anesthesia Plan  ASA: III  Anesthesia Plan: Spinal, Regional and MAC   Post-op Pain Management:  Regional for Post-op pain   Induction: Intravenous  PONV Risk Score and Plan: 2 and Ondansetron, Dexamethasone and Treatment may vary due to age or medical condition  Airway Management Planned: Simple Face Mask, Nasal Cannula and Natural Airway  Additional Equipment: None  Intra-op Plan:   Post-operative Plan:   Informed Consent: I have reviewed the patients History and Physical, chart, labs and discussed the procedure including the risks, benefits and alternatives for the proposed anesthesia with the patient or authorized representative who has indicated his/her understanding and acceptance.     Dental advisory given  Plan Discussed with: CRNA  Anesthesia Plan Comments: (Lab Results      Component                Value               Date                      WBC                      7.0                 01/25/2020                HGB                      7.4 (L)              01/25/2020                HCT                      24.1 (L)            01/25/2020                MCV                      101.3 (H)           01/25/2020                PLT                      211  01/25/2020          )       Anesthesia Quick Evaluation

## 2020-01-25 NOTE — Progress Notes (Signed)
Assisted Dr. Greg Stoltzfus with left, ultrasound guided, adductor canal block. Side rails up, monitors on throughout procedure. See vital signs in flow sheet. Tolerated Procedure well.  

## 2020-01-25 NOTE — Anesthesia Procedure Notes (Signed)
Anesthesia Regional Block: Adductor canal block   Pre-Anesthetic Checklist: ,, timeout performed, Correct Patient, Correct Site, Correct Laterality, Correct Procedure, Correct Position, site marked, Risks and benefits discussed,  Surgical consent,  Pre-op evaluation,  At surgeon's request and post-op pain management  Laterality: Left  Prep: Dura Prep       Needles:  Injection technique: Single-shot  Needle Type: Echogenic Stimulator Needle     Needle Length: 9cm  Needle Gauge: 20     Additional Needles:   Procedures:,,,, ultrasound used (permanent image in chart),,,,  Narrative:  Start time: 01/25/2020 7:58 AM End time: 01/25/2020 8:02 AM Injection made incrementally with aspirations every 5 mL.  Performed by: Personally  Anesthesiologist: Atilano Median, DO  Additional Notes: Patient identified. Risks/Benefits/Options discussed with patient including but not limited to bleeding, infection, nerve damage, failed block, incomplete pain control. Patient expressed understanding and wished to proceed. All questions were answered. Sterile technique was used throughout the entire procedure. Please see nursing notes for vital signs. Aspirated in 5cc intervals with injection for negative confirmation. Patient was given instructions on fall risk and not to get out of bed. All questions and concerns addressed with instructions to call with any issues or inadequate analgesia.

## 2020-01-25 NOTE — Progress Notes (Signed)
VAST consulted to access pt's left chest port. EMLA ordered and placed by unit RN. Pt reported she does not remember the last time her port was flushed or used. She stated it was placed greater than 20 years ago and she can't remember exactly when. Accessed port per protocol; flushed easily and without pain or swelling, however, no blood return.  Pt needs to get medications and fluids that are past due. Unit RN to utilize port for infusions at this time. VAST RN to report to oncoming VAST RN to assess port for blood return between 0100 and 0300, 11/27. If no blood return obtained, VAST RN will instill TPA line at that time; if TPA successful, labs will then be drawn to minimize breaks in line and decrease risk for infection.

## 2020-01-25 NOTE — Progress Notes (Signed)
PT Cancellation Note  Patient Details Name: Crystal Patterson MRN: [id number] DOB: April 08, 1965   Cancelled Treatment:     Pt order received but eval deferred until tomorrow - RN advises pt with pain control issues.  Will follow in am.   Jameka Ivie 01/25/2020, 4:19 PM

## 2020-01-25 NOTE — Brief Op Note (Signed)
01/25/2020  10:20 AM  PATIENT:  [name]  54 y.o. female  PRE-OPERATIVE DIAGNOSIS:  left total knee infection  POST-OPERATIVE DIAGNOSIS:  left total knee infection  PROCEDURE:  Procedure(s): INCISION AND DRAINAGE LEFT KNEE WITH POLY-LINER EXCHANGE (Left)  SURGEON:  Surgeon(s) and Role:    [name], MD - Primary  ANESTHESIA:   spinal  EBL:  50 mL   COUNTS:  YES  TOURNIQUET:   Total Tourniquet Time Documented: Thigh (Left) - 25 minutes Total: Thigh (Left) - 25 minutes   DICTATION: .Other Dictation: Dictation Number [id number]  PLAN OF CARE: Admit to inpatient   PATIENT DISPOSITION:  PACU - hemodynamically stable.   Delay start of Pharmacological VTE agent (>24hrs) due to surgical blood loss or risk of bleeding: no

## 2020-01-25 NOTE — H&P (Signed)
Crystal Patterson is an 54 y.o. female.   Chief Complaint:   Left knee pain with drainage HPI:   Patient is a 54 year old female who was taken to the operating room 3 weeks ago today for elective left total knee arthroplasty to treat severe arthritis of her left knee.  The surgery was uneventful and we have been seeing her in the postoperative period.  We saw her in the office this week due to drainage from her left knee incision.  I aspirated a large amount of fluid from her knee that is concerning for infection.  Gram stain and cultures are still pending from that aspiration given that there was a Thanksgiving holiday yesterday.  I have recommended she undergo an irrigation and debridement of her left operative total knee arthroplasty with a goal of hopefully salvage surgery in order to eradicate infection.  We will obtain new Gram stain and cultures today.  I will also change out the polyethylene liner.  I described this to her and her daughter in detail and the rationale and reasoning behind recommending surgery.  I am concerned because she is anemic.  Prior to her initial surgery, her only comorbidity that can have an impact on total joint surgery is the fact that she is immunocompromised with lupus.  She is not a diabetic and not a smoker.  She is a thin individual.  Her preoperative staph and MRSA screens were also negative at the time of her initial surgery.  Past Medical History:  Diagnosis Date  . Anginal pain (HCC) 2020  . Arthritis    Knees, hips,  . Elevated troponin 05/24/2016  . Finger amputation, no complication   . Heart murmur   . Hypercholesteremia   . Hypertension   . 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 Lupus [facility])     Past Surgical History:  Procedure Laterality Date  . CARDIAC CATHETERIZATION    . COLONOSCOPY N/A 01/17/2019   Procedure: COLONOSCOPY;  Surgeon: Corbin Ade, MD;  Location: [facility] ENDO SUITE;  Service: Endoscopy;  Laterality: N/A;  10:30   . left finger amputations    . POLYPECTOMY  01/17/2019   Procedure: POLYPECTOMY;  Surgeon: Corbin Ade, MD;  Location: [facility] ENDO SUITE;  Service: Endoscopy;;  . right hand surgery    . right knee arthroscopy    . RIGHT/LEFT HEART CATH AND CORONARY ANGIOGRAPHY N/A 05/25/2016   Procedure: Right/Left Heart Cath and Coronary Angiography;  Surgeon: Lennette Bihari, MD;  Location: [facility] INVASIVE CV LAB;  Service: Cardiovascular;  Laterality: N/A;  . TOTAL KNEE ARTHROPLASTY Left 01/04/2020   Procedure: LEFT TOTAL KNEE ARTHROPLASTY;  Surgeon: [name], MD;  Location: [facility] ORS;  Service: Orthopedics;  Laterality: Left;    Family History  Problem Relation Age of Onset  . Pancreatic cancer Mother   . Colon cancer Father    Social History:  reports that she has been smoking cigarettes. She has a 3.75 pack-year smoking history. She has never used smokeless tobacco. She reports current alcohol use of about 3.0 standard drinks of alcohol per week. She reports current drug use. Drug: Marijuana.  Allergies: No Known Allergies  Medications Prior to Admission  Medication Sig Dispense Refill  . allopurinol (ZYLOPRIM) 100 MG tablet Take 1 tablet (100 mg total) by mouth daily. 30 tablet 3  . amLODipine (NORVASC) 10 MG tablet Take 1 tablet (10 mg total) by mouth daily. 30 tablet 11  . aspirin 81 MG chewable tablet  Chew 1 tablet (81 mg total) by mouth 2 (two) times daily. 30 tablet 0  . atenolol (TENORMIN) 25 MG tablet Take 1 tablet (25 mg total) by mouth daily. 30 tablet 11  . atorvastatin (LIPITOR) 40 MG tablet Take 1 tablet (40 mg total) by mouth daily at 6 PM. (Patient taking differently: Take 40 mg by mouth daily. ) 30 tablet 11  . benazepril (LOTENSIN) 40 MG tablet Take 1 tablet (40 mg total) by mouth daily. 30 tablet 11  . escitalopram (LEXAPRO) 10 MG tablet Take 10 mg by mouth daily.    . furosemide (LASIX) 40 MG tablet Take 1 tablet (40 mg total) by mouth daily. 30 tablet 11  . gabapentin  (NEURONTIN) 300 MG capsule Take 300 mg by mouth daily.     [name] ibuprofen (ADVIL,MOTRIN) 800 MG tablet Take 800 mg by mouth 2 (two) times daily as needed for headache or moderate pain.     [name] Ketotifen Fumarate (ITCHY EYE DROPS OP) Place 1 drop into both eyes daily as needed (itchy eyes).     [name] KLOR-CON M10 10 MEQ tablet Take 10 mEq by mouth daily.    . methocarbamol (ROBAXIN) 500 MG tablet Take 1 tablet (500 mg total) by mouth every 6 (six) hours as needed for muscle spasms. 40 tablet 1  . omeprazole (PRILOSEC) 20 MG capsule Take 20 mg by mouth daily.    [name] oxyCODONE (OXY IR/ROXICODONE) 5 MG immediate release tablet Take 1-2 tablets (5-10 mg total) by mouth every 4 (four) hours as needed for moderate pain (pain score 4-6). 30 tablet 0  . spironolactone (ALDACTONE) 25 MG tablet Take 25 mg by mouth daily.    [name] zolpidem (AMBIEN) 10 MG tablet Take 10 mg by mouth at bedtime.    [name] albuterol (PROVENTIL HFA;VENTOLIN HFA) 108 (90 Base) MCG/ACT inhaler Inhale 2 puffs into the lungs every 6 (six) hours as needed for wheezing or shortness of breath.     . diclofenac sodium (VOLTAREN) 1 % GEL Apply 1 application topically 4 (four) times daily as needed (pain).     [name] doxycycline (VIBRA-TABS) 100 MG tablet Take 1 tablet (100 mg total) by mouth 2 (two) times daily for 14 days. 28 tablet 0  . potassium chloride (K-DUR) 10 MEQ tablet Take 10 mEq by mouth daily.       Results for orders placed or performed during the hospital encounter of 01/25/20 (from the past 48 hour(s))  Resp Panel by RT-PCR (Flu A&B, Covid) Nasopharyngeal Swab     Status: None   Collection Time: 01/25/20  6:13 AM   Specimen: Nasopharyngeal Swab; Nasopharyngeal(NP) swabs in vial transport medium  Result Value Ref Range   SARS Coronavirus 2 by RT PCR NEGATIVE NEGATIVE    Comment: (NOTE) SARS-CoV-2 target nucleic acids are NOT DETECTED.  The SARS-CoV-2 RNA is generally detectable in upper respiratory specimens during the acute phase of  infection. The lowest concentration of SARS-CoV-2 viral copies this assay can detect is 138 copies/mL. A negative result does not preclude SARS-Cov-2 infection and should not be used as the sole basis for treatment or other patient management decisions. A negative result may occur with  improper specimen collection/handling, submission of specimen other than nasopharyngeal swab, presence of viral mutation(s) within the areas targeted by this assay, and inadequate number of viral copies(<138 copies/mL). A negative result must be combined with clinical observations, patient history, and epidemiological information. The expected result is Negative.  Fact Sheet for Patients:  BloggerCourse.com  Fact Sheet for Healthcare Providers:  SeriousBroker.ithttps://www.fda.gov/media/152162/download  This test is no t yet approved or cleared by the [place] FDA and  has been authorized for detection and/or diagnosis of SARS-CoV-2 by FDA under an Emergency Use Authorization (EUA). This EUA will remain  in effect (meaning this test can be used) for the duration of the COVID-19 declaration under Section 564(b)(1) of the Act, 21 U.S.C.section 360bbb-3(b)(1), unless the authorization is terminated  or revoked sooner.       Influenza A by PCR NEGATIVE NEGATIVE   Influenza B by PCR NEGATIVE NEGATIVE    Comment: (NOTE) The Xpert Xpress SARS-CoV-2/FLU/RSV plus assay is intended as an aid in the diagnosis of influenza from Nasopharyngeal swab specimens and should not be used as a sole basis for treatment. Nasal washings and aspirates are unacceptable for Xpert Xpress SARS-CoV-2/FLU/RSV testing.  Fact Sheet for Patients: BloggerCourse.comhttps://www.fda.gov/media/152166/download  Fact Sheet for Healthcare Providers: SeriousBroker.ithttps://www.fda.gov/media/152162/download  This test is not yet approved or cleared by the [place] FDA and has been authorized for detection and/or diagnosis of SARS-CoV-2 by FDA  under an Emergency Use Authorization (EUA). This EUA will remain in effect (meaning this test can be used) for the duration of the COVID-19 declaration under Section 564(b)(1) of the Act, 21 U.S.C. section 360bbb-3(b)(1), unless the authorization is terminated or revoked.  Performed at [facility], [address] [place], [place] [address]   CBC     Status: Abnormal   Collection Time: 01/25/20  7:05 AM  Result Value Ref Range   WBC 7.0 4.0 - 10.5 K/uL   RBC 2.38 (L) 3.87 - 5.11 MIL/uL   Hemoglobin 7.4 (L) 12.0 - 15.0 g/dL   HCT 60.424.1 (L) 36 - 46 %   MCV 101.3 (H) 80.0 - 100.0 fL   MCH 31.1 26.0 - 34.0 pg   MCHC 30.7 30.0 - 36.0 g/dL   RDW 54.014.4 98.111.5 - 19.115.5 %   Platelets 211 150 - 400 K/uL   nRBC 0.0 0.0 - 0.2 %    Comment: Performed at [facility], [address] [place], [place] [address]   No results found.  Review of Systems  Constitutional: Positive for fatigue.  Musculoskeletal: Positive for joint swelling.    Blood pressure (!) 83/63, pulse 63, temperature 97.8 F (36.6 C), temperature source Oral, resp. rate 17, height 5\' 4"  (1.626 m), weight 63.5 kg, SpO2 (!) 78 %. Physical Exam Vitals reviewed.  HENT:     Head: Normocephalic and atraumatic.  Eyes:     Extraocular Movements: Extraocular movements intact.  Cardiovascular:     Rate and Rhythm: Normal rate.  Pulmonary:     Effort: Pulmonary effort is normal.  Abdominal:     Palpations: Abdomen is soft.  Musculoskeletal:     Cervical back: Normal range of motion.     Right knee: Swelling, effusion and erythema present.       Legs:  Neurological:     Mental Status: She is oriented to person, place, and time. She is lethargic.  Psychiatric:        Behavior: Behavior normal.      Assessment/Plan Left total knee joint infection  Our goal is to proceed to surgery today for a thorough irrigation and debridement of the left knee joint polyliner exchange.  She will then  be admitted for IV antibiotics and PICC line placement and we will likely obtain an infectious these consult potentially a medicine consult during this hospitalization.  Cristal Deerhristopher  Aretha Parrot, MD 01/25/2020, 8:10 AM

## 2020-01-25 NOTE — Anesthesia Procedure Notes (Signed)
Spinal  Patient location during procedure: OR Start time: 01/25/2020 8:35 AM End time: 01/25/2020 8:41 AM Staffing Performed: anesthesiologist  Anesthesiologist: Atilano Median, DO Preanesthetic Checklist Completed: patient identified, IV checked, site marked, risks and benefits discussed, surgical consent, monitors and equipment checked, pre-op evaluation and timeout performed Spinal Block Patient position: sitting Prep: DuraPrep Patient monitoring: heart rate, cardiac monitor, continuous pulse ox and blood pressure Approach: midline Location: L3-4 Injection technique: single-shot Needle Needle type: Pencan  Needle gauge: 24 G Needle length: 10 cm Additional Notes Patient identified. Risks/Benefits/Options discussed with patient including but not limited to bleeding, infection, nerve damage, paralysis, failed block, incomplete pain control, headache, blood pressure changes, nausea, vomiting, reactions to medications, itching and postpartum back pain. Confirmed with bedside nurse the patient's most recent platelet count. Confirmed with patient that they are not currently taking any anticoagulation, have any bleeding history or any family history of bleeding disorders. Patient expressed understanding and wished to proceed. All questions were answered. Sterile technique was used throughout the entire procedure. Please see nursing notes for vital signs. Warning signs of high block given to the patient including shortness of breath, tingling/numbness in hands, complete motor block, or any concerning symptoms with instructions to call for help. Patient was given instructions on fall risk and not to get out of bed. All questions and concerns addressed with instructions to call with any issues or inadequate analgesia.

## 2020-01-25 NOTE — Progress Notes (Signed)
Pharmacy Antibiotic Note  Crystal Patterson is a 54 y.o. female admitted on 01/25/2020 with PJI.[name]  Pharmacy has been consulted for vancomycin dosing.  Plan: Vanc 1g given pre-op around 0900.  Start vanc 750mg  IV q12 thereafter, goal trough 15-20 mcg/mL Check trough at steady state Monitor renal function, culture results  Height: 5\' 4"  (162.6 cm) Weight: 63.5 kg (139 lb 15.9 oz) IBW/kg (Calculated) : 54.7  Temp (24hrs), Avg:97.8 F (36.6 C), Min:97.8 F (36.6 C), Max:97.9 F (36.6 C)  Recent Labs  Lab 01/25/20 0705  WBC 7.0    Estimated Creatinine Clearance: 63.1 mL/min (by C-G formula based on SCr of 0.89 mg/dL).    No Known Allergies    Thank you for allowing pharmacy to be a part of this patient's care.  01/25/2020 12:09 PM

## 2020-01-25 NOTE — Transfer of Care (Signed)
Immediate Anesthesia Transfer of Care Note  Patient: Crystal Patterson  Procedure(s) Performed: INCISION AND DRAINAGE LEFT KNEE WITH POLY-LINER EXCHANGE (Left Knee)  Patient Location: PACU  Anesthesia Type:Spinal  Level of Consciousness: awake, alert  and oriented  Airway & Oxygen Therapy: Patient Spontanous Breathing and Patient connected to face mask  Post-op Assessment: Report given to RN and Post -op Vital signs reviewed and stable  Post vital signs: Reviewed and stable  Last Vitals:  Vitals Value Taken Time  BP 108/71 01/25/20 1030  Temp 36.6 C 01/25/20 1017  Pulse 69 01/25/20 1040  Resp 12 01/25/20 1040  SpO2 98 % 01/25/20 1040  Vitals shown include unvalidated device data.  Last Pain:  Vitals:   01/25/20 1017  TempSrc:   PainSc: Asleep      Patients Stated Pain Goal: 3 (01/25/20 0758)  Complications: No complications documented.

## 2020-01-25 NOTE — Anesthesia Postprocedure Evaluation (Signed)
Anesthesia Post Note  Patient: SHARY LAMOS  Procedure(s) Performed: INCISION AND DRAINAGE LEFT KNEE WITH POLY-LINER EXCHANGE (Left Knee)     Patient location during evaluation: PACU Anesthesia Type: Regional, Spinal and MAC Level of consciousness: oriented and awake and alert Pain management: pain level controlled Vital Signs Assessment: post-procedure vital signs reviewed and stable Respiratory status: spontaneous breathing, respiratory function stable and patient connected to nasal cannula oxygen Cardiovascular status: blood pressure returned to baseline and stable Postop Assessment: no headache, no backache and no apparent nausea or vomiting Anesthetic complications: no   No complications documented.  Last Vitals:  Vitals:   01/25/20 1130 01/25/20 1145  BP: 105/73 106/72  Pulse: 66 66  Resp: 12 13  Temp:  36.6 C  SpO2: 100% 100%    Last Pain:  Vitals:   01/25/20 1145  TempSrc:   PainSc: Asleep    LLE Motor Response: Purposeful movement (01/25/20 1145) LLE Sensation: Decreased (01/25/20 1145) RLE Motor Response: Purposeful movement (01/25/20 1145) RLE Sensation: Decreased (01/25/20 1145) L Sensory Level: S1-Sole of foot, small toes (01/25/20 1145) R Sensory Level: S1-Sole of foot, small toes (01/25/20 1145)  Belenda Cruise P Elouise Divelbiss

## 2020-01-25 NOTE — Op Note (Signed)
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  PROCEDURES: 1.  Left knee open arthrotomy with irrigation and debridement. 2.  Polyethylene liner exchange, left knee.  FINDINGS:  No gross purulence, but wound breakdown and large fluid collection consistent with seroma and still potential infection, soft tissue breakdown worrisome for malnutrition and consistent with the patient's previous history of worsening lupus,  Gram stain and cultures pending.  SURGEON:  Vanita Panda. Magnus Ivan, MD  ANESTHESIA:  Spinal.  ANTIBIOTICS:  1 g IV vancomycin after cultures obtained.  ESTIMATED BLOOD LOSS:  50 mL.  TOURNIQUET TIME:  Under 1 hour.  COMPLICATIONS:  None.  INDICATIONS:  The patient is a 54 year old female with lupus who 3 weeks ago from today underwent a left total knee arthroplasty to treat her severe knee arthritis of her left knee.  Both knees have severe end-stage arthritis and actually have  subluxation.  She could barely walk, and we felt it was appropriate to proceed with a knee replacement.  She is not a diabetic, but she does have a history of lupus so is immunocompromised.  At the time of her initial total knee arthroplasty, her MRSA  screen and Staph screen were also negative.  Her surgery on her total knee was uneventful.  We saw her in the office this week and she was developing drainage around the inferior aspect of her incision.  There was an eschar just medial and concern for  further  breakdown.  I did aspirate fluid from the knee and sent this for Gram stain and culture.  That has not come back yet given the Thanksgiving holiday, but the tissues were worrisome enough and the fluid was worrisome enough to consider an acute  total joint infection, so it was appropriate to bring her to the operating room for thorough irrigation and debridement and poly liner exchange with admission for IV antibiotics.  I had a long and thorough discussion with her and her daughter about this,  and they do wish to proceed.  She has not had any fever and chills, but has been lethargic.  Her white blood cell count peripherally was normal, but she is certainly anemic.  DESCRIPTION OF PROCEDURE:  After informed consent was obtained and appropriate left knee was marked, she was brought to the operating room and sat up on the operating table where spinal anesthesia was then obtained.  She was then laid in supine position.   A Foley catheter was placed, and then a nonsterile tourniquet was placed around her upper left thigh.  Her left thigh, knee, leg, ankle, and foot were prepped and draped with Betadine scrub and paint.  Timeout was called to identify correct patient,  correct left knee.  We did not use an Esmarch to wrap the leg and we had the tourniquet inflated to 300 mm of pressure.  I then made a direct midline incision over the patella and carried this proximally and distally.  When I dissected down distal, there  was no good soft tissue covering the arthrotomy.  The arthrotomy was actually open with sutures exposed that had not held the arthrotomy  open.  There was a large fluid collection consistent more of a seroma, but I still felt this was appropriate to send  for Gram stain and culture because there still could be an indolent infection sitting in this knee or an acute infection sitting in this knee.  I opened up the arthrotomy in its full amount.  I removed all visible sutures that I could see from the  knee.   We then removed the polyethylene liner as well, and I was able to curette all 3 compartments around the knee to remove any kind of worrisome looking seroma and necrotic tissue.  I then used a #10 blade to remove minimal amount of synovium.  The sharp  excisional debridement was only a few centimeters of soft tissue, just a small area of necrotic skin distally as well as necrotic fascia.  I then copiously irrigated the knee with 6 liters of normal saline solution using pulsatile lavage.  We then placed  our new polyethylene liner, which was the same size we had taken out, which was a 13 mm thickness fixed bearing insert for a size 4 tibial component.  The knee was ligamentously stable when I examined it with new poly in place.  We then finally used  IrriSept irrigation to irrigate the final aspect of the knee.  We then let the tourniquet down.  Hemostasis was obtained with electrocautery.  I closed the arthrotomy with interrupted #1 Ethibond suture followed by closing the deep tissue with 0 Vicryl  and 2-0 Vicryl in the subcutaneous tissue and interrupted 2-0 nylon was used to reapproximate the skin.  Xeroform well-padded sterile dressing was applied.  The patient was taken to recovery room in stable condition with all final counts being correct.   No complications noted.  Postoperatively, she will be admitted as an inpatient.  IN/NUANCE  D:01/25/2020 T:01/25/2020 JOB:[id number]

## 2020-01-25 NOTE — Progress Notes (Signed)
Pharmacy Note: adding zosyn to vancomycin  Zosyn 3.375 gm IV q8h, infuse each dose over 4 hours F/u GNR in OR culture. F/u renal function. Anticipate OPAT consult prior to discharge  Herby Abraham, Pharm.D 01/25/2020 2:01 PM

## 2020-01-26 LAB — CBC
HCT: 31.3 % — ABNORMAL LOW (ref 36.0–46.0)
Hemoglobin: 10.1 g/dL — ABNORMAL LOW (ref 12.0–15.0)
MCH: 30.5 pg (ref 26.0–34.0)
MCHC: 32.3 g/dL (ref 30.0–36.0)
MCV: 94.6 fL (ref 80.0–100.0)
Platelets: 224 10*3/uL (ref 150–400)
RBC: 3.31 MIL/uL — ABNORMAL LOW (ref 3.87–5.11)
RDW: 14.8 % (ref 11.5–15.5)
WBC: 16 10*3/uL — ABNORMAL HIGH (ref 4.0–10.5)
nRBC: 0 % (ref 0.0–0.2)

## 2020-01-26 LAB — BASIC METABOLIC PANEL
Anion gap: 8 (ref 5–15)
BUN: 13 mg/dL (ref 6–20)
CO2: 25 mmol/L (ref 22–32)
Calcium: 8.7 mg/dL — ABNORMAL LOW (ref 8.9–10.3)
Chloride: 102 mmol/L (ref 98–111)
Creatinine, Ser: 0.68 mg/dL (ref 0.44–1.00)
GFR, Estimated: >60 mL/min (ref >60)
Glucose, Bld: 115 mg/dL — ABNORMAL HIGH (ref 70–99)
Potassium: 4 mmol/L (ref 3.5–5.1)
Sodium: 135 mmol/L (ref 135–145)

## 2020-01-26 LAB — C-REACTIVE PROTEIN: CRP: 14.8 mg/dL — ABNORMAL HIGH (ref <1.0)

## 2020-01-26 LAB — SEDIMENTATION RATE: Sed Rate: 70 mm/hr — ABNORMAL HIGH (ref 0–22)

## 2020-01-26 NOTE — Evaluation (Signed)
Physical Therapy Evaluation Patient Details Name: Crystal Patterson MRN: [id number] DOB: 10-Sep-1965 Today's Date: 01/26/2020   History of Present Illness  Patient is 54 y.o. female s/p L TKR I&D with liner replacement and is also s/p Lt TKA on 01/04/20 with PMH significant for OA, HTN, Lupus.  Clinical Impression  Pt s/p I&D of L TKR and presents with decreased L LE strength/ROM and post op pain limiting functional mobility.  Pt should progress to dc home with assist of family.    Follow Up Recommendations Home health PT    Equipment Recommendations  None recommended by PT    Recommendations for Other Services       Precautions / Restrictions Precautions Precautions: Fall;Knee Restrictions Weight Bearing Restrictions: No LLE Weight Bearing: Weight bearing as tolerated      Mobility  Bed Mobility Overal bed mobility: Needs Assistance Bed Mobility: Supine to Sit     Supine to sit: Min assist     General bed mobility comments: Increased time with assist for L LE    Transfers Overall transfer level: Needs assistance Equipment used: Rolling walker (2 wheeled) Transfers: Sit to/from Stand Sit to Stand: Min assist;Mod assist         General transfer comment: cues for LE management and use of UEs to self assist  Ambulation/Gait Ambulation/Gait assistance: Min assist;+2 safety/equipment Gait Distance (Feet): 4 Feet Assistive device: Rolling walker (2 wheeled) Gait Pattern/deviations: Step-to pattern;Decreased stride length;Decreased weight shift to left;Decreased stance time - left Gait velocity: decr   General Gait Details: verbal cues for sequence, RW positioning, posture  Stairs            Wheelchair Mobility    Modified Rankin (Stroke Patients Only)       Balance Overall balance assessment: Needs assistance Sitting-balance support: Feet supported Sitting balance-Leahy Scale: Fair     Standing balance support: Bilateral upper extremity  supported;During functional activity Standing balance-Leahy Scale: Poor                               Pertinent Vitals/Pain Pain Assessment: Faces Faces Pain Scale: Hurts even more Pain Descriptors / Indicators: Grimacing;Guarding;Sore Pain Intervention(s): Limited activity within patient's tolerance;Monitored during session;Premedicated before session;Ice applied    Home Living Family/patient expects to be discharged to:: Private residence Living Arrangements: Alone Available Help at Discharge: Family Type of Home: House Home Access: Stairs to enter Entrance Stairs-Rails: None Entrance Stairs-Number of Steps: 2 Home Layout: One level Home Equipment: Environmental consultant - 2 wheels;Walker - 4 wheels;Shower seat;Toilet riser;Cane - single point Additional Comments: Pt has been staying with sister since TKR    Prior Function Level of Independence: Independent with assistive device(s)   Gait / Transfers Assistance Needed: pt using RW since TKR           Hand Dominance   Dominant Hand: Right    Extremity/Trunk Assessment   Upper Extremity Assessment Upper Extremity Assessment: Overall WFL for tasks assessed    Lower Extremity Assessment Lower Extremity Assessment: LLE deficits/detail LLE: Unable to fully assess due to pain    Cervical / Trunk Assessment Cervical / Trunk Assessment: Normal  Communication   Communication: No difficulties  Cognition Arousal/Alertness: Awake/alert Behavior During Therapy: WFL for tasks assessed/performed Overall Cognitive Status: Within Functional Limits for tasks assessed  General Comments: patient is so lethargic, she does not keep eyes open, frequent stimulation to keep aroused enough to participate in mobility      General Comments      Exercises Total Joint Exercises Ankle Circles/Pumps: AROM;Both;10 reps   Assessment/Plan    PT Assessment Patient needs continued PT services   PT Problem List Decreased strength;Decreased range of motion;Decreased activity tolerance;Decreased balance;Decreased mobility;Decreased knowledge of use of DME;Decreased knowledge of precautions       PT Treatment Interventions DME instruction;Gait training;Stair training;Functional mobility training;Therapeutic activities;Therapeutic exercise;Balance training;Patient/family education    PT Goals (Current goals can be found in the Care Plan section)  Acute Rehab PT Goals Patient Stated Goal: be able to walk with less pain PT Goal Formulation: With patient Time For Goal Achievement: 01/11/20 Potential to Achieve Goals: Good    Frequency 7X/week   Barriers to discharge        Co-evaluation               AM-PAC PT "6 Clicks" Mobility  Outcome Measure Help needed turning from your back to your side while in a flat bed without using bedrails?: A Little Help needed moving from lying on your back to sitting on the side of a flat bed without using bedrails?: A Little Help needed moving to and from a bed to a chair (including a wheelchair)?: A Little Help needed standing up from a chair using your arms (e.g., wheelchair or bedside chair)?: A Little Help needed to walk in hospital room?: A Lot Help needed climbing 3-5 steps with a railing? : A Lot 6 Click Score: 16    End of Session Equipment Utilized During Treatment: Gait belt Activity Tolerance: Patient limited by fatigue;Patient limited by pain Patient left: in chair;with call bell/phone within reach;with chair alarm set Nurse Communication: Mobility status PT Visit Diagnosis: Muscle weakness (generalized) (M62.81);Difficulty in walking, not elsewhere classified (R26.2)    Time: 5726-2035 PT Time Calculation (min) (ACUTE ONLY): 22 min   Charges:   PT Evaluation $PT Eval Low Complexity: 1 Low          Mauro Kaufmann PT Acute Rehabilitation Services Pager [phone] Office  [phone]   Crystal Patterson 01/26/2020, 12:04 PM

## 2020-01-26 NOTE — Progress Notes (Signed)
Physical Therapy Treatment Patient Details Name: Crystal Patterson MRN: [id number] DOB: [birth year] Today's Date: 01/26/2020    History of Present Illness Patient is 54 y.o. female s/p L TKR I&D with liner replacement and is also s/p Lt TKA on 01/04/20 with PMH significant for OA, HTN, Lupus.    PT Comments    Pt continues cooperative but limited by pain/fatigue.  Pt assisted up from chair to [facility] (attempting to sit multiple times before safe) and then to ambulate short distance to return to bed.     Follow Up Recommendations  Home health PT     Equipment Recommendations  None recommended by PT    Recommendations for Other Services       Precautions / Restrictions Precautions Precautions: Fall;Knee Restrictions Weight Bearing Restrictions: No LLE Weight Bearing: Weight bearing as tolerated    Mobility  Bed Mobility Overal bed mobility: Needs Assistance Bed Mobility: Sit to Supine     Supine to sit: Min assist Sit to supine: Mod assist   General bed mobility comments: Increased time with cues for sequence and assist for Bil LE  Transfers Overall transfer level: Needs assistance Equipment used: Rolling walker (2 wheeled) Transfers: Sit to/from Stand Sit to Stand: Mod assist Stand pivot transfers: Mod assist;+2 physical assistance;+2 safety/equipment       General transfer comment: cues for LE management and use of UEs to self assist  Ambulation/Gait Ambulation/Gait assistance: Mod assist;+2 safety/equipment Gait Distance (Feet): 10 Feet Assistive device: Rolling walker (2 wheeled) Gait Pattern/deviations: Step-to pattern;Decreased stride length;Decreased weight shift to left;Decreased stance time - left Gait velocity: decr   General Gait Details: verbal cues for sequence, RW positioning, posture   Stairs             Wheelchair Mobility    Modified Rankin (Stroke Patients Only)       Balance Overall balance assessment: Needs  assistance Sitting-balance support: Feet supported Sitting balance-Leahy Scale: Fair     Standing balance support: Bilateral upper extremity supported;During functional activity Standing balance-Leahy Scale: Poor                              Cognition Arousal/Alertness: Awake/alert Behavior During Therapy: WFL for tasks assessed/performed Overall Cognitive Status: Within Functional Limits for tasks assessed                                 General Comments: patient is so lethargic, she does not keep eyes open, frequent stimulation to keep aroused enough to participate in mobility      Exercises Total Joint Exercises Ankle Circles/Pumps: AROM;Both;10 reps    General Comments        Pertinent Vitals/Pain Pain Assessment: Faces Faces Pain Scale: Hurts whole lot Pain Location: L knee Pain Descriptors / Indicators: Grimacing;Guarding;Sore Pain Intervention(s): Limited activity within patient's tolerance;Monitored during session;Premedicated before session;Ice applied    Home Living Family/patient expects to be discharged to:: Private residence Living Arrangements: Alone Available Help at Discharge: Family Type of Home: House Home Access: Stairs to enter Entrance Stairs-Rails: None Home Layout: One level Home Equipment: Environmental consultant - 2 wheels;Walker - 4 wheels;Shower seat;Toilet riser;Cane - single point Additional Comments: Pt has been staying with sister since TKR    Prior Function Level of Independence: Independent with assistive device(s)  Gait / Transfers Assistance Needed: pt using RW since TKR  PT Goals (current goals can now be found in the care plan section) Acute Rehab PT Goals Patient Stated Goal: be able to walk with less pain PT Goal Formulation: With patient Time For Goal Achievement: 01/11/20 Potential to Achieve Goals: Good Progress towards PT goals: Progressing toward goals    Frequency    7X/week      PT Plan  Current plan remains appropriate    Co-evaluation              AM-PAC PT "6 Clicks" Mobility   Outcome Measure  Help needed turning from your back to your side while in a flat bed without using bedrails?: A Little Help needed moving from lying on your back to sitting on the side of a flat bed without using bedrails?: A Little Help needed moving to and from a bed to a chair (including a wheelchair)?: A Lot Help needed standing up from a chair using your arms (e.g., wheelchair or bedside chair)?: A Lot Help needed to walk in hospital room?: A Lot Help needed climbing 3-5 steps with a railing? : A Lot 6 Click Score: 14    End of Session Equipment Utilized During Treatment: Gait belt Activity Tolerance: Patient limited by fatigue;Patient limited by pain Patient left: in bed;with call bell/phone within reach;with nursing/sitter in room Nurse Communication: Mobility status PT Visit Diagnosis: Muscle weakness (generalized) (M62.81);Difficulty in walking, not elsewhere classified (R26.2)     Time: 7741-2878 PT Time Calculation (min) (ACUTE ONLY): 18 min  Charges:  $Therapeutic Activity: 8-22 mins                     Mauro Kaufmann PT Acute Rehabilitation Services Pager [phone] Office [phone]    Jos Cygan 01/26/2020, 2:27 PM

## 2020-01-26 NOTE — Plan of Care (Signed)
  Problem: Clinical Measurements: Goal: Ability to maintain clinical measurements within normal limits will improve Outcome: Progressing   Problem: Education: Goal: Knowledge of General Education information will improve Description: Including pain rating scale, medication(s)/side effects and non-pharmacologic comfort measures Outcome: Progressing   Problem: Skin Integrity: Goal: Demonstration of wound healing without infection will improve Outcome: Progressing

## 2020-01-26 NOTE — Progress Notes (Signed)
Subjective: 1 Day Post-Op Procedure(s) (LRB): INCISION AND DRAINAGE LEFT KNEE WITH POLY-LINER EXCHANGE (Left) Patient reports pain as mild.    Objective: Vital signs in last 24 hours: Temp:  [97.8 F (36.6 C)-98.9 F (37.2 C)] 98.4 F (36.9 C) (11/27 0944) Pulse Rate:  [64-69] 66 (11/27 0944) Resp:  [12-18] 17 (11/27 0944) BP: (100-130)/(67-89) 116/69 (11/27 0944) SpO2:  [81 %-100 %] 94 % (11/27 0944)  Intake/Output from previous day: 11/26 0701 - 11/27 0700 In: 2061.9 [P.O.:50; I.V.:1290.8; Blood:320; IV Piggyback:401.1] Out: 2100 [Urine:2050; Blood:50] Intake/Output this shift: No intake/output data recorded.  Recent Labs    01/25/20 0705 01/26/20 0420  HGB 7.4* 10.1*   Recent Labs    01/25/20 0705 01/26/20 0420  WBC 7.0 16.0*  RBC 2.38* 3.31*  HCT 24.1* 31.3*  PLT 211 224   Recent Labs    01/26/20 0420  NA 135  K 4.0  CL 102  CO2 25  BUN 13  CREATININE 0.68  GLUCOSE 115*  CALCIUM 8.7*   No results for input(s): LABPT, INR in the last 72 hours.  Neurologically intact Neurovascular intact Sensation intact distally Intact pulses distally Dorsiflexion/Plantar flexion intact Incision: dressing C/D/I No cellulitis present Compartment soft   Assessment/Plan: 1 Day Post-Op Procedure(s) (LRB): INCISION AND DRAINAGE LEFT KNEE WITH POLY-LINER EXCHANGE (Left) Advance diet Up with therapy\ WBWT LLE ABLA- improved after transfusion 1 unit prbc yesterday' Intra-op cx growing gram negative rods-continue with iv vancomycin for now   Anticipated LOS equal to or greater than 2 midnights due to - Age 37 and older with one or more of the following:  - Obesity  - Expected need for hospital services (PT, OT, Nursing) required for safe  discharge  - Anticipated need for postoperative skilled nursing care or inpatient rehab  - Active co-morbidities: Coronary Artery Disease and DVT/VTE OR   - Unanticipated findings during/Post Surgery: Infected knee joint or  operative site, Lab abnormalities and Slow post-op progression: GI, pain control, mobility  - Patient is a high risk of re-admission due to: None    Cristie Hem 01/26/2020, 10:54 AM

## 2020-01-27 MED ORDER — LIP MEDEX EX OINT
TOPICAL_OINTMENT | CUTANEOUS | Status: AC
  Administered 2020-01-27: 1
  Filled 2020-01-27: qty 7

## 2020-01-27 NOTE — Progress Notes (Signed)
Orthopedic Tech Progress Note Patient Details:  LAVELLA MYREN 1965-03-15 [id number]  Ortho Devices Type of Ortho Device: Knee Immobilizer Ortho Device/Splint Location: left       Saul Fordyce 01/27/2020, 2:34 PM

## 2020-01-27 NOTE — Plan of Care (Signed)
  Problem: Skin Integrity: Goal: Demonstration of wound healing without infection will improve Outcome: Progressing   Problem: Education: Goal: Knowledge of General Education information will improve Description: Including pain rating scale, medication(s)/side effects and non-pharmacologic comfort measures Outcome: Progressing   Problem: Clinical Measurements: Goal: Cardiovascular complication will be avoided Outcome: Progressing   Problem: Activity: Goal: Risk for activity intolerance will decrease Outcome: Progressing   Problem: Nutrition: Goal: Adequate nutrition will be maintained Outcome: Progressing   Problem: Coping: Goal: Level of anxiety will decrease Outcome: Progressing

## 2020-01-27 NOTE — Progress Notes (Signed)
Physical Therapy Treatment Patient Details Name: Crystal Patterson MRN: [id number] DOB: 1965-10-21 Today's Date: 01/27/2020    History of Present Illness Patient is 54 y.o. female s/p L TKR I&D with liner replacement and is also s/p Lt TKA on 01/04/20 with PMH significant for OA, HTN, Lupus.    PT Comments     Pt continues cooperative but requiring increased time for all tasks and progressing slowly with mobility 2* ongoing pain control issues.  This pm, pt ambulated increased but pain limited distance and transferred to/from [facility] for toileting.  Follow Up Recommendations  Home health PT     Equipment Recommendations  None recommended by PT    Recommendations for Other Services       Precautions / Restrictions Precautions Precautions: Fall;Knee Restrictions Weight Bearing Restrictions: No LLE Weight Bearing: Weight bearing as tolerated    Mobility  Bed Mobility Overal bed mobility: Needs Assistance Bed Mobility: Sit to Supine       Sit to supine: Min assist   General bed mobility comments: Increased time with cues for sequence and assist for Bil LE  Transfers Overall transfer level: Needs assistance Equipment used: Rolling walker (2 wheeled) Transfers: Sit to/from Stand Sit to Stand: Min assist;Mod assist Stand pivot transfers: Min assist;Mod assist       General transfer comment: cues for LE management and use of UEs to self assist  Ambulation/Gait Ambulation/Gait assistance: Min assist;Mod assist Gait Distance (Feet): 26 Feet Assistive device: Rolling walker (2 wheeled) Gait Pattern/deviations: Step-to pattern;Decreased stride length;Decreased weight shift to left;Decreased stance time - left Gait velocity: decr   General Gait Details: verbal cues for sequence, RW positioning, posture and increased L knee ext and L heel contact   Stairs             Wheelchair Mobility    Modified Rankin (Stroke Patients Only)       Balance Overall balance  assessment: Needs assistance Sitting-balance support: Feet supported Sitting balance-Leahy Scale: Fair     Standing balance support: Bilateral upper extremity supported;During functional activity Standing balance-Leahy Scale: Poor                              Cognition Arousal/Alertness: Awake/alert Behavior During Therapy: WFL for tasks assessed/performed Overall Cognitive Status: Within Functional Limits for tasks assessed                                        Exercises      General Comments        Pertinent Vitals/Pain Pain Assessment: Faces Faces Pain Scale: Hurts whole lot Pain Location: L knee Pain Descriptors / Indicators: Grimacing;Guarding;Sore Pain Intervention(s): Limited activity within patient's tolerance;Monitored during session;Premedicated before session;Ice applied    Home Living                      Prior Function            PT Goals (current goals can now be found in the care plan section) Acute Rehab PT Goals Patient Stated Goal: be able to walk with less pain PT Goal Formulation: With patient Time For Goal Achievement: 01/11/20 Potential to Achieve Goals: Good Progress towards PT goals: Progressing toward goals    Frequency    7X/week      PT Plan Current plan remains appropriate  Co-evaluation              AM-PAC PT "6 Clicks" Mobility   Outcome Measure  Help needed turning from your back to your side while in a flat bed without using bedrails?: A Little Help needed moving from lying on your back to sitting on the side of a flat bed without using bedrails?: A Little Help needed moving to and from a bed to a chair (including a wheelchair)?: A Lot Help needed standing up from a chair using your arms (e.g., wheelchair or bedside chair)?: A Lot Help needed to walk in hospital room?: A Lot Help needed climbing 3-5 steps with a railing? : A Lot 6 Click Score: 14    End of Session  Equipment Utilized During Treatment: Gait belt Activity Tolerance: Patient limited by fatigue;Patient limited by pain Patient left: with call bell/phone within reach;in bed;with bed alarm set;with nursing/sitter in room Nurse Communication: Mobility status PT Visit Diagnosis: Muscle weakness (generalized) (M62.81);Difficulty in walking, not elsewhere classified (R26.2)     Time: 6734-1937 PT Time Calculation (min) (ACUTE ONLY): 25 min  Charges:  $Gait Training: 8-22 mins $Therapeutic Activity: 8-22 mins                     Crystal Patterson PT Acute Rehabilitation Services Pager [phone] Office [phone]    Crystal Patterson 01/27/2020, 2:51 PM

## 2020-01-27 NOTE — Progress Notes (Signed)
Pt continues to turn her left foot and knee out of straight position. In discussion with physical therapy, they feel a knee immobilizer will help keep the leg in position. Knee immobilizer ordered and will be placed by ortho tech.

## 2020-01-27 NOTE — Progress Notes (Signed)
Subjective: 2 Days Post-Op Procedure(s) (LRB): INCISION AND DRAINAGE LEFT KNEE WITH POLY-LINER EXCHANGE (Left) Patient reports pain as moderate.    Objective: Vital signs in last 24 hours: Temp:  [98 F (36.7 C)-98.4 F (36.9 C)] 98.3 F (36.8 C) (11/28 0425) Pulse Rate:  [63-66] 64 (11/28 0425) Resp:  [17-18] 18 (11/28 0425) BP: (100-116)/(69-75) 110/73 (11/28 0425) SpO2:  [94 %-100 %] 100 % (11/28 0425)  Intake/Output from previous day: 11/27 0701 - 11/28 0700 In: 1700.3 [P.O.:840; I.V.:364.7; IV Piggyback:495.7] Out: 700 [Urine:700] Intake/Output this shift: Total I/O In: 265.1 [P.O.:240; I.V.:20.5; IV Piggyback:4.6] Out: -   Recent Labs    01/25/20 0705 01/26/20 0420  HGB 7.4* 10.1*   Recent Labs    01/25/20 0705 01/26/20 0420  WBC 7.0 16.0*  RBC 2.38* 3.31*  HCT 24.1* 31.3*  PLT 211 224   Recent Labs    01/26/20 0420  NA 135  K 4.0  CL 102  CO2 25  BUN 13  CREATININE 0.68  GLUCOSE 115*  CALCIUM 8.7*   No results for input(s): LABPT, INR in the last 72 hours.  Neurologically intact Neurovascular intact Sensation intact distally Intact pulses distally Dorsiflexion/Plantar flexion intact Incision: dressing C/D/I No cellulitis present Compartment soft   Assessment/Plan: 2 Days Post-Op Procedure(s) (LRB): INCISION AND DRAINAGE LEFT KNEE WITH POLY-LINER EXCHANGE (Left) Advance diet Up with therapy WBAT LLE Intra-op cx growing proteus mirabilis Continue iv vanc/zosyn (awaiting susceptibilities) ABLA- improved after transfusion 1 unit prbc friday   Anticipated LOS equal to or greater than 2 midnights due to - Age 53 and older with one or more of the following:  - Obesity  - Expected need for hospital services (PT, OT, Nursing) required for safe  discharge  - Anticipated need for postoperative skilled nursing care or inpatient rehab  - Active co-morbidities: Coronary Artery Disease and DVT/VTE OR   - Unanticipated findings during/Post  Surgery: Infected knee joint or operative site, Lab abnormalities and Slow post-op progression: GI, pain control, mobility  - Patient is a high risk of re-admission due to: None    Cristie Hem 01/27/2020, 8:33 AM

## 2020-01-27 NOTE — Progress Notes (Signed)
Patient ID: Crystal Patterson, female   DOB: 09-22-1965, 54 y.o.   MRN: [id number] Will discontinue the Vanc.  Culture grew out Proteus Mirabilis which is sensitive to Zosyn.  Will consult ID for final antibiotic recommendations.

## 2020-01-27 NOTE — Progress Notes (Signed)
Physical Therapy Treatment Patient Details Name: Crystal Patterson MRN: [id number] DOB: 11-Dec-1965 Today's Date: 01/27/2020    History of Present Illness Patient is 54 y.o. female s/p L TKR I&D with liner replacement and is also s/p Lt TKA on 01/04/20 with PMH significant for OA, HTN, Lupus.    PT Comments    Pt continues cooperative but requiring increased time for all tasks and progressing slowly with mobility 2* ongoing pain control issues.   Follow Up Recommendations  Home health PT     Equipment Recommendations  None recommended by PT    Recommendations for Other Services       Precautions / Restrictions Precautions Precautions: Fall;Knee Restrictions Weight Bearing Restrictions: No LLE Weight Bearing: Weight bearing as tolerated    Mobility  Bed Mobility Overal bed mobility: Needs Assistance Bed Mobility: Supine to Sit     Supine to sit: Min assist     General bed mobility comments: Increased time with cues for sequence and assist for Bil LE  Transfers Overall transfer level: Needs assistance Equipment used: Rolling walker (2 wheeled) Transfers: Sit to/from Stand Sit to Stand: Min assist;Mod assist         General transfer comment: cues for LE management and use of UEs to self assist  Ambulation/Gait Ambulation/Gait assistance: Min assist;Mod assist Gait Distance (Feet): 14 Feet Assistive device: Rolling walker (2 wheeled) Gait Pattern/deviations: Step-to pattern;Decreased stride length;Decreased weight shift to left;Decreased stance time - left Gait velocity: decr   General Gait Details: verbal cues for sequence, RW positioning, posture and increased L knee ext and L heel contact   Stairs             Wheelchair Mobility    Modified Rankin (Stroke Patients Only)       Balance Overall balance assessment: Needs assistance Sitting-balance support: Feet supported Sitting balance-Leahy Scale: Fair     Standing balance support: Bilateral  upper extremity supported;During functional activity Standing balance-Leahy Scale: Poor                              Cognition Arousal/Alertness: Awake/alert Behavior During Therapy: WFL for tasks assessed/performed Overall Cognitive Status: Within Functional Limits for tasks assessed                                        Exercises Total Joint Exercises Ankle Circles/Pumps: AROM;Both;10 reps Quad Sets: AROM;Both;10 reps;Supine Heel Slides: AAROM;Left;Supine;5 reps Straight Leg Raises: AAROM;Left;10 reps;Supine Goniometric ROM: -10 - 40 pain limited    General Comments        Pertinent Vitals/Pain Pain Assessment: Faces Faces Pain Scale: Hurts whole lot Pain Location: L knee Pain Descriptors / Indicators: Grimacing;Guarding;Sore Pain Intervention(s): Limited activity within patient's tolerance;Monitored during session;Premedicated before session;Ice applied    Home Living                      Prior Function            PT Goals (current goals can now be found in the care plan section) Acute Rehab PT Goals Patient Stated Goal: be able to walk with less pain PT Goal Formulation: With patient Time For Goal Achievement: 01/11/20 Potential to Achieve Goals: Good Progress towards PT goals: Progressing toward goals    Frequency    7X/week      PT Plan  Current plan remains appropriate    Co-evaluation              AM-PAC PT "6 Clicks" Mobility   Outcome Measure  Help needed turning from your back to your side while in a flat bed without using bedrails?: A Little Help needed moving from lying on your back to sitting on the side of a flat bed without using bedrails?: A Little Help needed moving to and from a bed to a chair (including a wheelchair)?: A Lot Help needed standing up from a chair using your arms (e.g., wheelchair or bedside chair)?: A Lot Help needed to walk in hospital room?: A Lot Help needed climbing 3-5  steps with a railing? : A Lot 6 Click Score: 14    End of Session Equipment Utilized During Treatment: Gait belt Activity Tolerance: Patient limited by fatigue;Patient limited by pain Patient left: in chair;with call bell/phone within reach;with chair alarm set Nurse Communication: Mobility status PT Visit Diagnosis: Muscle weakness (generalized) (M62.81);Difficulty in walking, not elsewhere classified (R26.2)     Time: 3154-0086 PT Time Calculation (min) (ACUTE ONLY): 32 min  Charges:  $Gait Training: 8-22 mins $Therapeutic Exercise: 8-22 mins                     Mauro Kaufmann PT Acute Rehabilitation Services Pager [phone] Office [phone]    Crystal Patterson 01/27/2020, 1:38 PM

## 2020-01-28 ENCOUNTER — Inpatient Hospital Stay: Payer: Self-pay

## 2020-01-28 DIAGNOSIS — M329 Systemic lupus erythematosus, unspecified: Secondary | ICD-10-CM | POA: Diagnosis not present

## 2020-01-28 DIAGNOSIS — T8454XA Infection and inflammatory reaction due to internal left knee prosthesis, initial encounter: Secondary | ICD-10-CM | POA: Diagnosis not present

## 2020-01-28 DIAGNOSIS — B964 Proteus (mirabilis) (morganii) as the cause of diseases classified elsewhere: Secondary | ICD-10-CM | POA: Diagnosis not present

## 2020-01-28 DIAGNOSIS — B9620 Unspecified Escherichia coli [E. coli] as the cause of diseases classified elsewhere: Secondary | ICD-10-CM | POA: Diagnosis not present

## 2020-01-28 LAB — BASIC METABOLIC PANEL
Anion gap: 12 (ref 5–15)
BUN: 13 mg/dL (ref 6–20)
CO2: 25 mmol/L (ref 22–32)
Calcium: 8.7 mg/dL — ABNORMAL LOW (ref 8.9–10.3)
Chloride: 97 mmol/L — ABNORMAL LOW (ref 98–111)
Creatinine, Ser: 0.75 mg/dL (ref 0.44–1.00)
GFR, Estimated: >60 mL/min (ref >60)
Glucose, Bld: 91 mg/dL (ref 70–99)
Potassium: 3.1 mmol/L — ABNORMAL LOW (ref 3.5–5.1)
Sodium: 134 mmol/L — ABNORMAL LOW (ref 135–145)

## 2020-01-28 LAB — CBC
HCT: 35.3 % — ABNORMAL LOW (ref 36.0–46.0)
Hemoglobin: 11.3 g/dL — ABNORMAL LOW (ref 12.0–15.0)
MCH: 30.5 pg (ref 26.0–34.0)
MCHC: 32 g/dL (ref 30.0–36.0)
MCV: 95.4 fL (ref 80.0–100.0)
Platelets: 238 10*3/uL (ref 150–400)
RBC: 3.7 MIL/uL — ABNORMAL LOW (ref 3.87–5.11)
RDW: 14.4 % (ref 11.5–15.5)
WBC: 13.7 10*3/uL — ABNORMAL HIGH (ref 4.0–10.5)
nRBC: 0 % (ref 0.0–0.2)

## 2020-01-28 MED ORDER — POTASSIUM CHLORIDE CRYS ER 20 MEQ PO TBCR
40.0000 meq | EXTENDED_RELEASE_TABLET | Freq: Two times a day (BID) | ORAL | Status: AC
  Administered 2020-01-28 (×2): 40 meq via ORAL
  Filled 2020-01-28 (×2): qty 2

## 2020-01-28 MED ORDER — ASCORBIC ACID 500 MG PO TABS
500.0000 mg | ORAL_TABLET | Freq: Every day | ORAL | Status: DC
  Administered 2020-01-28 – 2020-02-12 (×13): 500 mg via ORAL
  Filled 2020-01-28 (×14): qty 1

## 2020-01-28 MED ORDER — SODIUM CHLORIDE 0.9 % IV SOLN: 2.0000 g | INTRAVENOUS | Status: DC

## 2020-01-28 MED ORDER — CEFAZOLIN SODIUM-DEXTROSE 2-4 GM/100ML-% IV SOLN
2.0000 g | Freq: Three times a day (TID) | INTRAVENOUS | Status: DC
  Administered 2020-01-28 – 2020-02-12 (×46): 2 g via INTRAVENOUS
  Filled 2020-01-28 (×50): qty 100

## 2020-01-28 MED ORDER — ZINC SULFATE 220 (50 ZN) MG PO CAPS
220.0000 mg | ORAL_CAPSULE | Freq: Every day | ORAL | Status: DC
  Administered 2020-01-28 – 2020-02-12 (×13): 220 mg via ORAL
  Filled 2020-01-28 (×14): qty 1

## 2020-01-28 NOTE — Care Management Important Message (Signed)
Important Message  Patient Details IM Letter given to the Patient. Name: Crystal Patterson MRN: [id number] Date of Birth: Oct 11, 1965   Medicare Important Message Given:  Yes     Caren Macadam 01/28/2020, 11:33 AM

## 2020-01-28 NOTE — Consult Note (Addendum)
Date of Admission:  01/25/2020          Reason for Consult: Prosthetic knee infection with E coli and Proteus sp single staged exchange and I and D   Referring Provider: Dr. Ninfa Linden   Assessment:  1. 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 SLE, ?RA 3. GOut 4. CKD 5. Hyperlipidemia   Plan:  1. Change to IV ancef but followup the office cultures as well 2. Plan on 6 weeks of IV therapy followed by po keflex to complete MINIMUM of 6 months of therapy but likely longer esp if she is on immunosuppressive therapies 3. Her SLE, gout and ? RA could confound interpreting ESR, CRP   Diagnosis: PJI  Culture Result: E coli, proteus  No Known Allergies  OPAT Orders Discharge antibiotics:  Cefazolin    Duration:  6 weeks End Date:  See OPAT note  Portacath Care Per Protocol:    Labs  weekly while on IV antibiotics: x__ CBC with differential _x_ BMP w GFR/CMP _x_ CRP _x_ ESR    _x_ Please leave Portacath  in place until doctor has seen patient or been notified  NOT SURE THAT THIS IS BEING USED CHRONICALLY [place] eventually  Fax weekly labs to [phone]  Clinic Follow Up Appt:   SAMAURI KELLENBERGER has an appointment on 03/06/2019 at 345pm with Dr. Tommy Medal at the  [facility] is located in the [facility] at  [place] in [place].  [address], which is located to the left of the elevators.  Phone: [phone]  Fax: [phone]  [email or web address]  She should arrive 15 minutes prior to her appt.     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 allopurinol  100 mg Oral Daily  . amLODipine  10 mg Oral Daily  . vitamin C  500 mg Oral Daily  . aspirin  81 mg Oral BID  . atenolol  25 mg Oral Daily  . atorvastatin  40 mg Oral Daily  . benazepril  40 mg Oral Daily   . docusate sodium  100 mg Oral BID  . escitalopram  10 mg Oral Daily  . furosemide  40 mg Oral Daily  . gabapentin  300 mg Oral Daily  . pantoprazole  40 mg Oral Daily  . potassium chloride  10 mEq Oral Daily  . potassium chloride  40 mEq Oral BID  . spironolactone  25 mg Oral Daily  . zinc sulfate  220 mg Oral Daily   Continuous Infusions: . sodium chloride 20 mL/hr at 01/28/20 0600  .  ceFAZolin (ANCEF) IV    . methocarbamol (ROBAXIN) IV     PRN Meds:.acetaminophen, albuterol, diphenhydrAMINE, HYDROmorphone (DILAUDID) injection, lidocaine-prilocaine, methocarbamol **OR** methocarbamol (ROBAXIN) IV, metoCLOPramide **OR** metoCLOPramide (REGLAN) injection, ondansetron **OR** ondansetron (ZOFRAN) IV, oxyCODONE, oxyCODONE, zolpidem  HPI: Crystal Patterson is a 54 y.o. female who has severe arthritis, systemic lupus chronic kidney disease who underwent elective total knee arthroplasty 3 weeks prior to admission.  She then developed drainage from her left knee incision.  Dr. Ninfa Linden saw her in the office and aspirate a large amount of fluid in the knee Gram stain from those aspirates yielded GNR which is still incubating in the outpatient world. Patient was taken to the OR by Dr. Ninfa Linden who performed I and D  and Polyethylene liner exchange.  Cultures from the inpatient world are growing fairly sensitive E coli and proteus.  We will narrow to ancef but followup the office cultures as well.   Review of Systems: Review of Systems  Constitutional: Negative for chills, fever, malaise/fatigue and weight loss.  HENT: Negative for congestion and sore throat.   Eyes: Negative for blurred vision and photophobia.  Respiratory: Negative for cough, shortness of breath and wheezing.   Cardiovascular: Negative for chest pain, palpitations and leg swelling.  Gastrointestinal: Negative for abdominal pain, blood in stool, constipation, diarrhea, heartburn, melena, nausea and vomiting.  Genitourinary:  Negative for dysuria, flank pain and hematuria.  Musculoskeletal: Positive for joint pain and myalgias. Negative for back pain and falls.  Skin: Negative for itching and rash.  Neurological: Negative for dizziness, focal weakness, loss of consciousness, weakness and headaches.  Endo/Heme/Allergies: Does not bruise/bleed easily.  Psychiatric/Behavioral: Negative for depression and suicidal ideas. The patient does not have insomnia.     Past Medical History:  Diagnosis Date  . Anginal pain ([place]) 2020  . Arthritis    Knees, hips,  . Elevated troponin 05/24/2016  . Finger amputation, no complication   . Heart murmur   . Hypercholesteremia   . Hypertension   . Infection of total left knee replacement ([place]) 01/25/2020  . Lupus ([place])     Social History   Tobacco Use  . Smoking status: Current Every Day Smoker    Packs/day: 0.25    Years: 15.00    Pack years: 3.75    Types: Cigarettes  . Smokeless tobacco: Never Used  Vaping Use  . Vaping Use: Never used  Substance Use Topics  . Alcohol use: Yes    Alcohol/week: 3.0 standard drinks    Types: 3 Cans of beer per week  . Drug use: Yes    Types: Marijuana    Comment: l    Family History  Problem Relation Age of Onset  . Pancreatic cancer Mother   . Colon cancer Father    No Known Allergies  OBJECTIVE: Blood pressure 108/70, pulse 67, temperature 98.2 F (36.8 C), temperature source Oral, resp. rate 16, height 5' 4" (1.626 m), weight 63.5 kg, SpO2 97 %.  Physical Exam Constitutional:      General: She is not in acute distress.    Appearance: Normal appearance. She is well-developed. She is not ill-appearing or diaphoretic.  HENT:     Head: Normocephalic and atraumatic.     Right Ear: Hearing and external ear normal.     Left Ear: Hearing and external ear normal.     Nose: No nasal deformity or rhinorrhea.  Eyes:     General: No scleral icterus.    Conjunctiva/sclera: Conjunctivae normal.     Right eye: Right  conjunctiva is not injected.     Left eye: Left conjunctiva is not injected.     Pupils: Pupils are equal, round, and reactive to light.  Neck:     Vascular: No JVD.  Cardiovascular:     Rate and Rhythm: Normal rate.     Heart sounds: S1 normal and S2 normal.  Pulmonary:     Effort: No respiratory distress.  Abdominal:     General: There is no distension.     Palpations: Abdomen is soft.  Musculoskeletal:        General: Normal range of motion.     Right shoulder: Normal.     Left shoulder: Normal.     Cervical  back: Normal range of motion and neck supple.     Right hip: Normal.     Left hip: Normal.     Right knee: Normal.     Left knee: Normal.  Lymphadenopathy:     Head:     Right side of head: No submandibular, preauricular or posterior auricular adenopathy.     Left side of head: No submandibular, preauricular or posterior auricular adenopathy.     Cervical: No cervical adenopathy.     Right cervical: No superficial or deep cervical adenopathy.    Left cervical: No superficial or deep cervical adenopathy.  Skin:    General: Skin is warm and dry.     Coloration: Skin is not pale.     Findings: No abrasion, bruising, ecchymosis, erythema, lesion or rash.     Nails: There is no clubbing.  Neurological:     General: No focal deficit present.     Mental Status: She is alert.     Sensory: No sensory deficit.     Coordination: Coordination normal.     Gait: Gait normal.  Psychiatric:        Attention and Perception: She is attentive.        Speech: Speech normal.        Behavior: Behavior is cooperative.        Thought Content: Thought content normal.        Cognition and Memory: Memory is impaired.        Judgment: Judgment normal.    Right knee bandaged  Lab Results Lab Results  Component Value Date   WBC 13.7 (H) 01/28/2020   HGB 11.3 (L) 01/28/2020   HCT 35.3 (L) 01/28/2020   MCV 95.4 01/28/2020   PLT 238 01/28/2020    Lab Results  Component Value Date    CREATININE 0.75 01/28/2020   BUN 13 01/28/2020   NA 134 (L) 01/28/2020   K 3.1 (L) 01/28/2020   CL 97 (L) 01/28/2020   CO2 25 01/28/2020    Lab Results  Component Value Date   ALT 11 (L) 12/06/2016   AST 22 12/06/2016   ALKPHOS 86 12/06/2016   BILITOT 1.5 (H) 12/06/2016     Microbiology: Recent Results (from the past 240 hour(s))  Gram stain     Status: None   Collection Time: 01/23/20  1:30 PM   Specimen: Synovial Fluid  Result Value Ref Range Status   MICRO NUMBER: [id number]  Final   SPECIMEN QUALITY: Adequate  Final   Source NOT GIVEN  Final   STATUS: FINAL  Final   GRAM STAIN:   Final    Moderate Polymorphonuclear leukocytes No organisms seen No epithelial cells seen  Body Fluid Culture     Status: Abnormal (Preliminary result)   Collection Time: 01/23/20  1:30 PM   Specimen: Synovial Fluid   Synovial Flu  Result Value Ref Range Status   Body Fluid Culture, Sterile Preliminary report (A)  Preliminary   Organism ID, Bacteria Gram negative rods (A)  Preliminary    Comment: Heavy growth   Organism ID, Bacteria Gram negative rods (A)  Preliminary    Comment: Heavy growth  Resp Panel by RT-PCR (Flu A&B, Covid) Nasopharyngeal Swab     Status: None   Collection Time: 01/25/20  6:13 AM   Specimen: Nasopharyngeal Swab; Nasopharyngeal(NP) swabs in vial transport medium  Result Value Ref Range Status   SARS Coronavirus 2 by RT PCR NEGATIVE NEGATIVE Final    Comment: (NOTE)  SARS-CoV-2 target nucleic acids are NOT DETECTED.  The SARS-CoV-2 RNA is generally detectable in upper respiratory specimens during the acute phase of infection. The lowest concentration of SARS-CoV-2 viral copies this assay can detect is 138 copies/mL. A negative result does not preclude SARS-Cov-2 infection and should not be used as the sole basis for treatment or other patient management decisions. A negative result may occur with  improper specimen collection/handling, submission of specimen  other than nasopharyngeal swab, presence of viral mutation(s) within the areas targeted by this assay, and inadequate number of viral copies(<138 copies/mL). A negative result must be combined with clinical observations, patient history, and epidemiological information. The expected result is Negative.  Fact Sheet for Patients:  EntrepreneurPulse.com.au  Fact Sheet for Healthcare Providers:  IncredibleEmployment.be  This test is no t yet approved or cleared by the [place] FDA and  has been authorized for detection and/or diagnosis of SARS-CoV-2 by FDA under an Emergency Use Authorization (EUA). This EUA will remain  in effect (meaning this test can be used) for the duration of the COVID-19 declaration under Section 564(b)(1) of the Act, 21 U.S.C.section 360bbb-3(b)(1), unless the authorization is terminated  or revoked sooner.       Influenza A by PCR NEGATIVE NEGATIVE Final   Influenza B by PCR NEGATIVE NEGATIVE Final    Comment: (NOTE) The Xpert Xpress SARS-CoV-2/FLU/RSV plus assay is intended as an aid in the diagnosis of influenza from Nasopharyngeal swab specimens and should not be used as a sole basis for treatment. Nasal washings and aspirates are unacceptable for Xpert Xpress SARS-CoV-2/FLU/RSV testing.  Fact Sheet for Patients: EntrepreneurPulse.com.au  Fact Sheet for Healthcare Providers: IncredibleEmployment.be  This test is not yet approved or cleared by the [place] FDA and has been authorized for detection and/or diagnosis of SARS-CoV-2 by FDA under an Emergency Use Authorization (EUA). This EUA will remain in effect (meaning this test can be used) for the duration of the COVID-19 declaration under Section 564(b)(1) of the Act, 21 U.S.C. section 360bbb-3(b)(1), unless the authorization is terminated or revoked.  Performed at [facility], [place] [address] [place], [place] [address]   Surgical pcr screen     Status: None   Collection Time: 01/25/20  6:25 AM   Specimen: Nasal Mucosa; Nasal Swab  Result Value Ref Range Status   MRSA, PCR NEGATIVE NEGATIVE Final   Staphylococcus aureus NEGATIVE NEGATIVE Final    Comment: (NOTE) The Xpert SA Assay (FDA approved for NASAL specimens in patients 28 years of age and older), is one component of a comprehensive surveillance program. It is not intended to diagnose infection nor to guide or monitor treatment. Performed at [facility], [place] [address], [place], [place] [address]   Aerobic/Anaerobic Culture (surgical/deep wound)     Status: None (Preliminary result)   Collection Time: 01/25/20  9:26 AM   Specimen: PATH Other; Tissue  Result Value Ref Range Status   Specimen Description   Final    ABSCESS LT KNEE Performed at [place] [address], [place], [place] [address]    Special Requests   Final    PATIENT ON FOLLOWING DOXYCYCLINE Performed at [place] [address], [place], [place] [address]    Gram Stain   Final    NO WBC SEEN RARE GRAM NEGATIVE RODS Performed at [facility], [place] [address], [place], [place] [address]    Culture   Final  RARE PROTEUS MIRABILIS RARE ESCHERICHIA COLI NO ANAEROBES ISOLATED; CULTURE IN PROGRESS FOR 5 DAYS    Report Status PENDING  Incomplete   Organism ID, Bacteria PROTEUS MIRABILIS  Final   Organism ID, Bacteria ESCHERICHIA COLI  Final      Susceptibility   Escherichia coli - MIC*    AMPICILLIN >=32 RESISTANT Resistant     CEFAZOLIN <=4 SENSITIVE Sensitive     CEFEPIME <=0.12 SENSITIVE Sensitive     CEFTAZIDIME <=1 SENSITIVE Sensitive     CEFTRIAXONE <=0.25 SENSITIVE Sensitive     CIPROFLOXACIN >=4 RESISTANT Resistant     GENTAMICIN <=1 SENSITIVE Sensitive     IMIPENEM <=0.25 SENSITIVE Sensitive     TRIMETH/SULFA <=20 SENSITIVE Sensitive     AMPICILLIN/SULBACTAM  16 INTERMEDIATE Intermediate     PIP/TAZO <=4 SENSITIVE Sensitive     * RARE ESCHERICHIA COLI   Proteus mirabilis - MIC*    AMPICILLIN <=2 SENSITIVE Sensitive     CEFAZOLIN <=4 SENSITIVE Sensitive     CEFEPIME <=0.12 SENSITIVE Sensitive     CEFTAZIDIME <=1 SENSITIVE Sensitive     CEFTRIAXONE <=0.25 SENSITIVE Sensitive     CIPROFLOXACIN <=0.25 SENSITIVE Sensitive     GENTAMICIN <=1 SENSITIVE Sensitive     IMIPENEM 8 INTERMEDIATE Intermediate     TRIMETH/SULFA <=20 SENSITIVE Sensitive     AMPICILLIN/SULBACTAM <=2 SENSITIVE Sensitive     PIP/TAZO <=4 SENSITIVE Sensitive     * RARE PROTEUS MIRABILIS    Alcide Evener, [place] [facility] [place] [facility] [phone] pager  01/28/2020, 2:30 PM

## 2020-01-28 NOTE — Progress Notes (Signed)
Patient ID: Crystal Patterson, female   DOB: 08-18-1965, 54 y.o.   MRN: [id number] The patient is awake and alert this morning.  She does report left knee pain.  Her hemoglobin is stable.  Her white blood cell count is still high at 13,000.  That has trended down a little bit.  I will consult the infectious disease specialist today for the recommendations for antibiotics and duration.  I did change the dressing at the bedside for the left knee.  I am concerned about some eschar at her incision.  I anticipate her being here for at least another 1 to 2 days to make sure that the wound is healing appropriate and that I do not anticipate a return trip to the operating room.  I would also like to see her white blood cell count trend down.

## 2020-01-28 NOTE — TOC Progression Note (Signed)
Transition of Care [facility]) - Progression Note    Patient Details  Name: JAMAYA SLEETH MRN: [id number] Date of Birth: [birth year]  Transition of Care [facility] CM/SW Contact  Clearance Coots, LCSW Phone Number: 01/28/2020, 4:25 PM  Clinical Narrative:  [id number] Plan of Care.   TOC staff spoke with Ortho CaseManager Sherri. Patient disposition unknown. She will follow up with the patient and physician in the morning.   TOC staff will make self available to assist with patient needs.     Barriers to Discharge: Continued Medical Work up  Expected Discharge Plan and Services                                                 Social Determinants of Health (SDOH) Interventions    Readmission Risk Interventions No flowsheet data found.

## 2020-01-28 NOTE — Progress Notes (Signed)
   01/28/20 1400  PT Visit Information  Last PT Received On 01/28/20  Pt just back to bed and states her pain is better this afternoon. Agreeable to exercise focused session. Requires encouragement and rest breaks to complete TKA HEP, knee ROM improving.  Will continue to follow in acute setting. SNF vs HHPT pending progress.  Assistance Needed +1  History of Present Illness Patient is 54 y.o. female s/p L TKR I&D with liner replacement and is also s/p Lt TKA on 01/04/20 with PMH significant for OA, HTN, Lupus.  Subjective Data  Patient Stated Goal be able to walk with less pain  Precautions  Precautions Fall;Knee  Required Braces or Orthoses Knee Immobilizer - Left  Restrictions  Weight Bearing Restrictions No  Other Position/Activity Restrictions WBAT  Pain Assessment  Pain Assessment Faces  Faces Pain Scale 6  Pain Location L knee  Pain Descriptors / Indicators Grimacing;Guarding;Sore  Pain Intervention(s) Limited activity within patient's tolerance;Monitored during session;Premedicated before session;Repositioned  Cognition  Arousal/Alertness Awake/alert  Behavior During Therapy WFL for tasks assessed/performed  Overall Cognitive Status Within Functional Limits for tasks assessed  Total Joint Exercises  Ankle Circles/Pumps AROM;Both;10 reps  Heel Slides AAROM;Left;10 reps  Hip ABduction/ADduction AAROM;Left;10 reps;Supine  Straight Leg Raises AAROM;Left;10 reps;Supine  Goniometric ROM ~ 10 to 60 degrees AAROM L knee flexion   Quad Sets AROM;Both;10 reps  PT - End of Session  Equipment Utilized During Treatment Gait belt (pt requested KI to be off d/t spasms )  Activity Tolerance Patient limited by fatigue;Patient limited by pain  Patient left with call bell/phone within reach;in bed;with bed alarm set   PT - Assessment/Plan  PT Plan Discharge plan needs to be updated  PT Visit Diagnosis Muscle weakness (generalized) (M62.81);Difficulty in walking, not elsewhere classified  (R26.2)  PT Frequency (ACUTE ONLY) 7X/week  Follow Up Recommendations Other (comment);SNF;Home health PT (pending progress)  PT equipment None recommended by PT  AM-PAC PT "6 Clicks" Mobility Outcome Measure (Version 2)  Help needed turning from your back to your side while in a flat bed without using bedrails? 3  Help needed moving from lying on your back to sitting on the side of a flat bed without using bedrails? 3  Help needed moving to and from a bed to a chair (including a wheelchair)? 3  Help needed standing up from a chair using your arms (e.g., wheelchair or bedside chair)? 3  Help needed to walk in hospital room? 2  Help needed climbing 3-5 steps with a railing?  1  6 Click Score 15  Consider Recommendation of Discharge To: CIR/SNF/LTACH  PT Goal Progression  Progress towards PT goals Progressing toward goals  Acute Rehab PT Goals  PT Goal Formulation With patient  Time For Goal Achievement 01/11/20  Potential to Achieve Goals Good  PT Time Calculation  PT Start Time (ACUTE ONLY) 1420  PT Stop Time (ACUTE ONLY) 1441  PT Time Calculation (min) (ACUTE ONLY) 21 min  PT General Charges  $$ ACUTE PT VISIT 1 Visit  PT Treatments  $Therapeutic Exercise 8-22 mins

## 2020-01-28 NOTE — Progress Notes (Signed)
Physical Therapy Treatment Patient Details Name: Crystal Patterson MRN: [id number] DOB: 1965/11/21 Today's Date: 01/28/2020    History of Present Illness Patient is 54 y.o. female s/p L TKR I&D with liner replacement and is also s/p Lt TKA on 01/04/20 with PMH significant for OA, HTN, Lupus.    PT Comments    Pt is progressing slowly, remains limited by pain and fatigue. May need SNF placement as she lives alon, has brother that assists/checks in on her intermittently however cannot provide 24 hr assist   Follow Up Recommendations  Other (comment);SNF;Home health PT (pending progress)     Equipment Recommendations  None recommended by PT    Recommendations for Other Services       Precautions / Restrictions Precautions Precautions: Fall;Knee Required Braces or Orthoses: Knee Immobilizer - Left Restrictions Weight Bearing Restrictions: No Other Position/Activity Restrictions: WBAT    Mobility  Bed Mobility Overal bed mobility: Needs Assistance Bed Mobility: Supine to Sit     Supine to sit: Min assist     General bed mobility comments: Increased time with cues for sequence and assist for Bil LE  Transfers   Equipment used: Rolling walker (2 wheeled) Transfers: Sit to/from [employer] Sit to Stand: Min assist;Min guard Stand pivot transfers: Min assist       General transfer comment: cues for LE management and use of UEs to self assist, incr time   Ambulation/Gait Ambulation/Gait assistance: Min assist Gait Distance (Feet): 6 Feet Assistive device: Rolling walker (2 wheeled) Gait Pattern/deviations: Step-to pattern;Decreased step length - right;Decreased step length - left;Antalgic;Decreased weight shift to left     General Gait Details: verbal cues for sequence, RW positioning, posture and increased L knee ext and L heel contact, pt unable to put wt on LLE d/t pain. requires incr time, limited to steps forward and back to chair d/t  pain   Stairs             Wheelchair Mobility    Modified Rankin (Stroke Patients Only)       Balance     Sitting balance-Leahy Scale: Fair     Standing balance support: Bilateral upper extremity supported;During functional activity Standing balance-Leahy Scale: Poor                              Cognition Arousal/Alertness: Awake/alert Behavior During Therapy: WFL for tasks assessed/performed Overall Cognitive Status: Within Functional Limits for tasks assessed                                        Exercises Total Joint Exercises Ankle Circles/Pumps: AROM;Both;10 reps Quad Sets: AROM;Left;5 reps;Limitations Quad Sets Limitations: pain    General Comments        Pertinent Vitals/Pain Pain Assessment: Faces Pain Score: 9  Faces Pain Scale: Hurts whole lot Pain Location: L knee Pain Descriptors / Indicators: Grimacing;Guarding;Sore;Moaning Pain Intervention(s): Limited activity within patient's tolerance;Monitored during session    Home Living                      Prior Function            PT Goals (current goals can now be found in the care plan section) Acute Rehab PT Goals Patient Stated Goal: be able to walk with less pain PT Goal Formulation: With patient Time  For Goal Achievement: 01/11/20 Potential to Achieve Goals: Good Progress towards PT goals: Progressing toward goals (slowly)    Frequency    7X/week      PT Plan Discharge plan needs to be updated    Co-evaluation              AM-PAC PT "6 Clicks" Mobility   Outcome Measure  Help needed turning from your back to your side while in a flat bed without using bedrails?: A Little Help needed moving from lying on your back to sitting on the side of a flat bed without using bedrails?: A Little Help needed moving to and from a bed to a chair (including a wheelchair)?: A Little Help needed standing up from a chair using your arms (e.g.,  wheelchair or bedside chair)?: A Little Help needed to walk in hospital room?: A Lot Help needed climbing 3-5 steps with a railing? : Total 6 Click Score: 15    End of Session Equipment Utilized During Treatment: Gait belt (pt requested KI to be off d/t spasms ) Activity Tolerance: Patient limited by fatigue;Patient limited by pain Patient left: with call bell/phone within reach;in bed;with bed alarm set;with nursing/sitter in room   PT Visit Diagnosis: Muscle weakness (generalized) (M62.81);Difficulty in walking, not elsewhere classified (R26.2)     Time: 4081-4481 PT Time Calculation (min) (ACUTE ONLY): 26 min  Charges:  $Gait Training: 8-22 mins $Therapeutic Activity: 8-22 mins                     Delice Bison, PT  Acute Rehab Dept ([facility]) [phone] Pager [phone]  01/28/2020    [facility] 01/28/2020, 11:29 AM

## 2020-01-28 NOTE — Progress Notes (Signed)
PHARMACY CONSULT NOTE FOR:  OUTPATIENT  PARENTERAL ANTIBIOTIC THERAPY (OPAT)  Indication: PJI of left knee Regimen: Cefazolin 2 gm IV Q 8 hours End date: 03/06/2020  IV antibiotic discharge orders are pended. To discharging provider:  please sign these orders via discharge navigator,  Select New Orders & click on the button choice - Manage This Unsigned Work.     Thank you for allowing pharmacy to be a part of this patient's care.  Sharin Mons, PharmD, BCPS, BCIDP Infectious Diseases Clinical Pharmacist Phone: [phone] 01/28/2020, 4:07 PM

## 2020-01-28 NOTE — Care Plan (Signed)
Patient is an Ortho bundle patient through [facility] and previously discharged home with [facility] services [facility]). Discharged from services after 5 visits as patient was doing well and transitioned to OPPT [facility] in [place]). Made [facility] aware of current admission and they will be following while in hospital in case further [facility] skilled nursing and PT are needed. Will continue to follow for any needs from office. Will keep [facility] OPPT updated as well if plan is to go SNF or D/C home with [facility] services so as not to overlap therapy services. Please call Ralph Dowdy, [facility] [phone] with questions.

## 2020-01-29 ENCOUNTER — Encounter (HOSPITAL_COMMUNITY): Payer: Self-pay | Admitting: Orthopaedic Surgery

## 2020-01-29 DIAGNOSIS — B964 Proteus (mirabilis) (morganii) as the cause of diseases classified elsewhere: Secondary | ICD-10-CM | POA: Diagnosis not present

## 2020-01-29 DIAGNOSIS — B9620 Unspecified Escherichia coli [E. coli] as the cause of diseases classified elsewhere: Secondary | ICD-10-CM | POA: Diagnosis not present

## 2020-01-29 DIAGNOSIS — T8454XA Infection and inflammatory reaction due to internal left knee prosthesis, initial encounter: Secondary | ICD-10-CM | POA: Diagnosis not present

## 2020-01-29 DIAGNOSIS — M329 Systemic lupus erythematosus, unspecified: Secondary | ICD-10-CM | POA: Diagnosis not present

## 2020-01-29 LAB — TYPE AND SCREEN
ABO/RH(D): A POS
Antibody Screen: NEGATIVE
Unit division: 0
Unit division: 0

## 2020-01-29 LAB — BASIC METABOLIC PANEL
Anion gap: 10 (ref 5–15)
BUN: 10 mg/dL (ref 6–20)
CO2: 24 mmol/L (ref 22–32)
Calcium: 8.9 mg/dL (ref 8.9–10.3)
Chloride: 98 mmol/L (ref 98–111)
Creatinine, Ser: 0.72 mg/dL (ref 0.44–1.00)
GFR, Estimated: >60 mL/min (ref >60)
Glucose, Bld: 97 mg/dL (ref 70–99)
Potassium: 4 mmol/L (ref 3.5–5.1)
Sodium: 132 mmol/L — ABNORMAL LOW (ref 135–145)

## 2020-01-29 LAB — BODY FLUID CULTURE

## 2020-01-29 LAB — CBC
HCT: 36.2 % (ref 36.0–46.0)
Hemoglobin: 11.7 g/dL — ABNORMAL LOW (ref 12.0–15.0)
MCH: 30.5 pg (ref 26.0–34.0)
MCHC: 32.3 g/dL (ref 30.0–36.0)
MCV: 94.5 fL (ref 80.0–100.0)
Platelets: 248 10*3/uL (ref 150–400)
RBC: 3.83 MIL/uL — ABNORMAL LOW (ref 3.87–5.11)
RDW: 14.2 % (ref 11.5–15.5)
WBC: 15.8 10*3/uL — ABNORMAL HIGH (ref 4.0–10.5)
nRBC: 0 % (ref 0.0–0.2)

## 2020-01-29 LAB — BPAM RBC
Blood Product Expiration Date: 202112092359
Blood Product Expiration Date: 202112102359
ISSUE DATE / TIME: 202111260930
Unit Type and Rh: 6200
Unit Type and Rh: 6200

## 2020-01-29 LAB — HIV ANTIBODY (ROUTINE TESTING W REFLEX): HIV Screen 4th Generation wRfx: NONREACTIVE

## 2020-01-29 MED ORDER — SODIUM CHLORIDE 0.9% FLUSH: 10.0000 mL | INTRAVENOUS | Status: DC | PRN

## 2020-01-29 MED ORDER — CHLORHEXIDINE GLUCONATE CLOTH 2 % EX PADS
6.0000 | MEDICATED_PAD | Freq: Every day | CUTANEOUS | Status: DC
  Administered 2020-01-30 – 2020-02-12 (×14): 6 via TOPICAL

## 2020-01-29 NOTE — Progress Notes (Signed)
Subjective: No new complaints   Antibiotics:  Anti-infectives (From admission, onward)   Start     Dose/Rate Route Frequency Ordered Stop   01/28/20 1800  cefTRIAXone (ROCEPHIN) 2 g in sodium chloride 0.9 % 100 mL IVPB  Status:  Discontinued        2 g 200 mL/hr over 30 Minutes Intravenous Every 24 hours 01/28/20 1021 01/28/20 1224   01/28/20 1800  ceFAZolin (ANCEF) IVPB 2g/100 mL premix        2 g 200 mL/hr over 30 Minutes Intravenous Every 8 hours 01/28/20 1224     01/26/20 0200  piperacillin-tazobactam (ZOSYN) IVPB 3.375 g  Status:  Discontinued        3.375 g 12.5 mL/hr over 240 Minutes Intravenous Every 8 hours 01/25/20 1808 01/28/20 1236   01/25/20 2100  vancomycin (VANCOREADY) IVPB 750 mg/150 mL  Status:  Discontinued        750 mg 150 mL/hr over 60 Minutes Intravenous Every 12 hours 01/25/20 1216 01/27/20 1125   01/25/20 1445  piperacillin-tazobactam (ZOSYN) IVPB 3.375 g  Status:  Discontinued        3.375 g 12.5 mL/hr over 240 Minutes Intravenous Every 8 hours 01/25/20 1353 01/25/20 1808   01/25/20 1000  vancomycin (VANCOCIN) 1,000 mg in sodium chloride 0.9 % 250 mL IVPB  Status:  Discontinued        1,000 mg 250 mL/hr over 60 Minutes Intravenous Every 12 hours 01/25/20 0850 01/25/20 0910   01/25/20 0915  vancomycin (VANCOCIN) IVPB 1000 mg/200 mL premix        1,000 mg 200 mL/hr over 60 Minutes Intravenous  Once 01/25/20 0910 01/25/20 0909   01/25/20 0851  vancomycin (VANCOCIN) 1-5 GM/200ML-% IVPB       Note to Pharmacy: Sherron FlemingsHarvey, Dustin   : cabinet override      01/25/20 0851 01/25/20 0919      Medications: Scheduled Meds: . allopurinol  100 mg Oral Daily  . amLODipine  10 mg Oral Daily  . vitamin C  500 mg Oral Daily  . aspirin  81 mg Oral BID  . atenolol  25 mg Oral Daily  . atorvastatin  40 mg Oral Daily  . benazepril  40 mg Oral Daily  . Chlorhexidine Gluconate Cloth  6 each Topical Daily  . docusate sodium  100 mg Oral BID  . escitalopram  10 mg  Oral Daily  . furosemide  40 mg Oral Daily  . gabapentin  300 mg Oral Daily  . pantoprazole  40 mg Oral Daily  . potassium chloride  10 mEq Oral Daily  . spironolactone  25 mg Oral Daily  . zinc sulfate  220 mg Oral Daily   Continuous Infusions: . sodium chloride Stopped (01/29/20 0516)  .  ceFAZolin (ANCEF) IV 2 g (01/29/20 1402)  . methocarbamol (ROBAXIN) IV     PRN Meds:.acetaminophen, albuterol, diphenhydrAMINE, HYDROmorphone (DILAUDID) injection, lidocaine-prilocaine, methocarbamol **OR** methocarbamol (ROBAXIN) IV, metoCLOPramide **OR** metoCLOPramide (REGLAN) injection, ondansetron **OR** ondansetron (ZOFRAN) IV, oxyCODONE, oxyCODONE, sodium chloride flush, zolpidem    Objective: Weight change:   Intake/Output Summary (Last 24 hours) at 01/29/2020 1517 Last data filed at 01/29/2020 1116 Gross per 24 hour  Intake 867.3 ml  Output 1000 ml  Net -132.7 ml   Blood pressure 100/79, pulse (!) 58, temperature 98.1 F (36.7 C), temperature source Oral, resp. rate 16, height 5\' 4"  (1.626 m), weight 63.5 kg, SpO2 100 %. Temp:  [98 F (36.7 C)-99.2  F (37.3 C)] 98.1 F (36.7 C) (11/30 0428) Pulse Rate:  [58-75] 58 (11/30 0628) Resp:  [15-18] 16 (11/30 0628) BP: (97-101)/(67-79) 100/79 (11/30 0428) SpO2:  [87 %-100 %] 100 % (11/30 0086)  Physical Exam: General: Alert and awake, oriented x3, not in any acute distress. HEENT: anicteric sclera, EOMI CVS regular rate, normal  Chest: , no wheezing, no respiratory distress Abdomen: soft non-distended,  Extremities: knee with bandage Neuro: nonfocal  CBC:    BMET Recent Labs    01/28/20 0252 01/29/20 0649  NA 134* 132*  K 3.1* 4.0  CL 97* 98  CO2 25 24  GLUCOSE 91 97  BUN 13 10  CREATININE 0.75 0.72  CALCIUM 8.7* 8.9     Liver Panel  No results for input(s): PROT, ALBUMIN, AST, ALT, ALKPHOS, BILITOT, BILIDIR, IBILI in the last 72 hours.     Sedimentation Rate No results for input(s): ESRSEDRATE in the  last 72 hours. C-Reactive Protein No results for input(s): CRP in the last 72 hours.  Micro Results: Recent Results (from the past 720 hour(s))  SARS CORONAVIRUS 2 (TAT 6-24 HRS) Nasopharyngeal Nasopharyngeal Swab     Status: None   Collection Time: 01/02/20 10:42 AM   Specimen: Nasopharyngeal Swab  Result Value Ref Range Status   SARS Coronavirus 2 NEGATIVE NEGATIVE Final    Comment: (NOTE) SARS-CoV-2 target nucleic acids are NOT DETECTED.  The SARS-CoV-2 RNA is generally detectable in upper and lower respiratory specimens during the acute phase of infection. Negative results do not preclude SARS-CoV-2 infection, do not rule out co-infections with other pathogens, and should not be used as the sole basis for treatment or other patient management decisions. Negative results must be combined with clinical observations, patient history, and epidemiological information. The expected result is Negative.  Fact Sheet for Patients: HairSlick.no  Fact Sheet for Healthcare Providers: quierodirigir.com  This test is not yet approved or cleared by the [place] FDA and  has been authorized for detection and/or diagnosis of SARS-CoV-2 by FDA under an Emergency Use Authorization (EUA). This EUA will remain  in effect (meaning this test can be used) for the duration of the COVID-19 declaration under Se ction 564(b)(1) of the Act, 21 U.S.C. section 360bbb-3(b)(1), unless the authorization is terminated or revoked sooner.  Performed at [facility], [address] [place], [place] [address]   Gram stain     Status: None   Collection Time: 01/23/20  1:30 PM   Specimen: Synovial Fluid  Result Value Ref Range Status   MICRO NUMBER: [id number]  Final   SPECIMEN QUALITY: Adequate  Final   Source NOT GIVEN  Final   STATUS: FINAL  Final   GRAM STAIN:   Final    Moderate Polymorphonuclear leukocytes No organisms seen No epithelial  cells seen  Body Fluid Culture     Status: Abnormal   Collection Time: 01/23/20  1:30 PM   Specimen: Synovial Fluid   Synovial Flu  Result Value Ref Range Status   Body Fluid Culture, Sterile Final report (A)  Final   Organism ID, Bacteria Escherichia coli (A)  Final    Comment: Heavy growth   Organism ID, Bacteria Proteus mirabilis (A)  Final    Comment: Heavy growth   Antimicrobial Susceptibility Comment  Final    Comment:       ** S = Susceptible; I = Intermediate; R = Resistant **  P = Positive; N = Negative             MICS are expressed in micrograms per mL    Antibiotic                 RSLT#1    RSLT#2    RSLT#3    RSLT#4 Amoxicillin/Clavulanic Acid    S Ampicillin                     R         S Cefepime                       S         S Ceftriaxone                    S         S Cefuroxime                     S         S Ciprofloxacin                  R         S Ertapenem                      S         S Gentamicin                     S         S Imipenem                       S Levofloxacin                   R         S Meropenem                      S         S Piperacillin/Tazobactam        S         S Tetracycline                   S         R Tobramycin                     S         S Trimethoprim/Sulfa             S         S   Resp Panel by RT-PCR (Flu A&B, Covid) Nasopharyngeal Swab     Status: None   Collection Time: 01/25/20  6:13 AM   Specimen: Nasopharyngeal Swab; Nasopharyngeal(NP) swabs in vial transport medium  Result Value Ref Range Status   SARS Coronavirus 2 by RT PCR NEGATIVE NEGATIVE Final    Comment: (NOTE) SARS-CoV-2 target nucleic acids are NOT DETECTED.  The SARS-CoV-2 RNA is generally detectable in upper respiratory specimens during the acute phase of infection. The lowest concentration of SARS-CoV-2 viral copies this assay can detect is 138 copies/mL. A negative result does not preclude SARS-Cov-2 infection and should  not be used as the sole basis for treatment or other patient management decisions. A negative result may occur with  improper specimen collection/handling, submission of specimen  other than nasopharyngeal swab, presence of viral mutation(s) within the areas targeted by this assay, and inadequate number of viral copies(<138 copies/mL). A negative result must be combined with clinical observations, patient history, and epidemiological information. The expected result is Negative.  Fact Sheet for Patients:  BloggerCourse.com  Fact Sheet for Healthcare Providers:  SeriousBroker.it  This test is no t yet approved or cleared by the [place] FDA and  has been authorized for detection and/or diagnosis of SARS-CoV-2 by FDA under an Emergency Use Authorization (EUA). This EUA will remain  in effect (meaning this test can be used) for the duration of the COVID-19 declaration under Section 564(b)(1) of the Act, 21 U.S.C.section 360bbb-3(b)(1), unless the authorization is terminated  or revoked sooner.       Influenza A by PCR NEGATIVE NEGATIVE Final   Influenza B by PCR NEGATIVE NEGATIVE Final    Comment: (NOTE) The Xpert Xpress SARS-CoV-2/FLU/RSV plus assay is intended as an aid in the diagnosis of influenza from Nasopharyngeal swab specimens and should not be used as a sole basis for treatment. Nasal washings and aspirates are unacceptable for Xpert Xpress SARS-CoV-2/FLU/RSV testing.  Fact Sheet for Patients: BloggerCourse.com  Fact Sheet for Healthcare Providers: SeriousBroker.it  This test is not yet approved or cleared by the [place] FDA and has been authorized for detection and/or diagnosis of SARS-CoV-2 by FDA under an Emergency Use Authorization (EUA). This EUA will remain in effect (meaning this test can be used) for the duration of the COVID-19 declaration under  Section 564(b)(1) of the Act, 21 U.S.C. section 360bbb-3(b)(1), unless the authorization is terminated or revoked.  Performed at [facility], [address], [place], [place] [address]   Surgical pcr screen     Status: None   Collection Time: 01/25/20  6:25 AM   Specimen: Nasal Mucosa; Nasal Swab  Result Value Ref Range Status   MRSA, PCR NEGATIVE NEGATIVE Final   Staphylococcus aureus NEGATIVE NEGATIVE Final    Comment: (NOTE) The Xpert SA Assay (FDA approved for NASAL specimens in patients 8 years of age and older), is one component of a comprehensive surveillance program. It is not intended to diagnose infection nor to guide or monitor treatment. Performed at [facility], [address], [place], [place] [address]   Aerobic/Anaerobic Culture (surgical/deep wound)     Status: None (Preliminary result)   Collection Time: 01/25/20  9:26 AM   Specimen: PATH Other; Tissue  Result Value Ref Range Status   Specimen Description   Final    ABSCESS LT KNEE Performed at [facility], [address] [place], [place] [address]    Special Requests   Final    PATIENT ON FOLLOWING DOXYCYCLINE Performed at [facility], [address] [place], [place] [address]    Gram Stain   Final    NO WBC SEEN RARE GRAM NEGATIVE RODS Performed at [facility], [address], [place], [place] [address]    Culture   Final    RARE PROTEUS MIRABILIS RARE ESCHERICHIA COLI NO ANAEROBES ISOLATED; CULTURE IN PROGRESS FOR 5 DAYS    Report Status PENDING  Incomplete   Organism ID, Bacteria PROTEUS MIRABILIS  Final   Organism ID, Bacteria ESCHERICHIA COLI  Final      Susceptibility   Escherichia coli - MIC*    AMPICILLIN >=32 RESISTANT Resistant     CEFAZOLIN <=4 SENSITIVE Sensitive     CEFEPIME <=0.12 SENSITIVE Sensitive  CEFTAZIDIME <=1 SENSITIVE Sensitive     CEFTRIAXONE <=0.25 SENSITIVE Sensitive      CIPROFLOXACIN >=4 RESISTANT Resistant     GENTAMICIN <=1 SENSITIVE Sensitive     IMIPENEM <=0.25 SENSITIVE Sensitive     TRIMETH/SULFA <=20 SENSITIVE Sensitive     AMPICILLIN/SULBACTAM 16 INTERMEDIATE Intermediate     PIP/TAZO <=4 SENSITIVE Sensitive     * RARE ESCHERICHIA COLI   Proteus mirabilis - MIC*    AMPICILLIN <=2 SENSITIVE Sensitive     CEFAZOLIN <=4 SENSITIVE Sensitive     CEFEPIME <=0.12 SENSITIVE Sensitive     CEFTAZIDIME <=1 SENSITIVE Sensitive     CEFTRIAXONE <=0.25 SENSITIVE Sensitive     CIPROFLOXACIN <=0.25 SENSITIVE Sensitive     GENTAMICIN <=1 SENSITIVE Sensitive     IMIPENEM 8 INTERMEDIATE Intermediate     TRIMETH/SULFA <=20 SENSITIVE Sensitive     AMPICILLIN/SULBACTAM <=2 SENSITIVE Sensitive     PIP/TAZO <=4 SENSITIVE Sensitive     * RARE PROTEUS MIRABILIS    Studies/Results: [place] EKG SITE RITE  Result Date: 01/28/2020 If Site Rite image not attached, placement could not be confirmed due to current cardiac rhythm.     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 female with  54 y.o. female who has severe arthritis, systemic lupus chronic kidney disease who underwent elective total knee arthroplasty 3 weeks prior to admission.  She then developed drainage from her left knee incision.  Dr. Magnus Ivan saw her in the office and aspirate a large amount of fluid in the knee Gram stain from those aspirates yielded GNR patient then  was taken to the OR by Dr. Magnus Ivan who performed I and D and Polyethylene liner exchange.  Cultures from [facility] have yielded E coli R for FQ and AMP, Proteus and these now match the outpatient culture data.  She is going back to the OR tomorrow.  I have her scheduled to see me in clinic.  We will extend her antibiotic course to be 6 weeks postop with postop day #1 now being  Thursday.   We will keep an eye on further cultures if they are obtained but otherwise sign off with plan to  continue her on current therapy and I will plan on seeing her in clinic.      LOS: 4 days   Acey Lav 01/29/2020, 3:17 PM

## 2020-01-29 NOTE — Progress Notes (Signed)
Physical Therapy Treatment Patient Details Name: Crystal Patterson MRN: [id number] DOB: 04-09-1965 Today's Date: 01/29/2020    History of Present Illness Patient is 54 y.o. female s/p L TKR I&D with liner replacement and is also s/p Lt TKA on 01/04/20 with PMH significant for OA, HTN, Lupus.    PT Comments    General Comments: AxO x 3 pleasant, polite, likes to repeat "yes Mam".  I did catch her smoking in her room.  "I'm sorry Mam, I didn't know.  I haven't had one in ten days". Assisted OOB to [facility]. General bed mobility comments: required increased time and assist to carefully move L LE off bed.  Nearly 4 min to complete task.General transfer comment: cues for LE management and use of UEs to self assist, incr time.  Assisted from bed to [facility] then to recliner.General Gait Details: transfers only this session due to pain and difficulty advancing either LE. Positioned in recliner to comfort. Pt is schedule to return to the OR tomorrow for another I&D.   Follow Up Recommendations  Home health PT;Supervision/Assistance - 24 hour  (pt plans to go to sisters house and have daughter help during the day and sister help at evenings and night)     Equipment Recommendations  None recommended by PT    Recommendations for Other Services       Precautions / Restrictions Precautions Precautions: Fall;Knee Required Braces or Orthoses: Knee Immobilizer - Left Restrictions Weight Bearing Restrictions: No LLE Weight Bearing: Weight bearing as tolerated Other Position/Activity Restrictions: WBAT    Mobility  Bed Mobility Overal bed mobility: Needs Assistance Bed Mobility: Supine to Sit     Supine to sit: Mod assist     General bed mobility comments: required increased time and assist to carefully move L LE off bed.  Nearly 4 min to complete task.  Transfers Overall transfer level: Needs assistance Equipment used: Rolling walker (2 wheeled) Transfers: Sit to/from [employer] Sit to Stand: Min assist Stand pivot transfers: Min assist;Mod assist       General transfer comment: cues for LE management and use of UEs to self assist, incr time.  Assisted from bed to [facility] then to recliner.  Ambulation/Gait             General Gait Details: transfers only this session due to pain and difficulty advancing either LE.   Stairs             Wheelchair Mobility    Modified Rankin (Stroke Patients Only)       Balance                                            Cognition Arousal/Alertness: Awake/alert Behavior During Therapy: WFL for tasks assessed/performed                                   General Comments: AxO x 3 pleasant, polite, likes to repeat "yes Mam".  I did catch her smoking in her room.  "I'm sorry Mam, I didn't know.  I haven't had one in ten days".      Exercises      General Comments        Pertinent Vitals/Pain Pain Assessment: 0-10 Pain Score: 8  Faces Pain Scale: Hurts even more Pain Location:  L knee Pain Descriptors / Indicators: Grimacing;Guarding;Sore;Tender Pain Intervention(s): Monitored during session;Premedicated before session;Repositioned    Home Living                      Prior Function            PT Goals (current goals can now be found in the care plan section) Progress towards PT goals: Progressing toward goals    Frequency           PT Plan Discharge plan needs to be updated    Co-evaluation              AM-PAC PT "6 Clicks" Mobility   Outcome Measure  Help needed turning from your back to your side while in a flat bed without using bedrails?: A Little Help needed moving from lying on your back to sitting on the side of a flat bed without using bedrails?: A Little Help needed moving to and from a bed to a chair (including a wheelchair)?: A Little Help needed standing up from a chair using your arms (e.g., wheelchair or bedside  chair)?: A Lot Help needed to walk in hospital room?: A Lot Help needed climbing 3-5 steps with a railing? : A Lot 6 Click Score: 15    End of Session Equipment Utilized During Treatment: Gait belt Activity Tolerance: Patient limited by fatigue;Patient limited by pain Patient left: with call bell/phone within reach;in chair;with chair alarm set Nurse Communication: Mobility status PT Visit Diagnosis: Muscle weakness (generalized) (M62.81);Difficulty in walking, not elsewhere classified (R26.2)     Time: 1700-1749 PT Time Calculation (min) (ACUTE ONLY): 28 min  Charges:  $Therapeutic Activity: 23-37 mins                     Felecia Shelling  PTA Acute  Rehabilitation Services Pager      [phone] Office      [phone]

## 2020-01-29 NOTE — Progress Notes (Signed)
PHARMACY CONSULT NOTE FOR:  OUTPATIENT  PARENTERAL ANTIBIOTIC THERAPY (OPAT)  Indication: PJI of left knee Regimen: Cefazolin 2 gm IV Q 8 hours End date: 03/12/2020  IV antibiotic discharge orders are pended. To discharging provider:  please sign these orders via discharge navigator,  Select New Orders & click on the button choice - Manage This Unsigned Work.     Thank you for allowing pharmacy to be a part of this patient's care.  Lucina Mellow (PharmD Candidate 2022)  Sharin Mons, PharmD, BCPS, BCIDP Infectious Diseases Clinical Pharmacist Phone: [phone] 01/29/2020, 11:31 AM

## 2020-01-29 NOTE — Progress Notes (Signed)
Patient ID: Crystal Patterson, female   DOB: 1965-11-26, 54 y.o.   MRN: [id number] The patient was seen by infectious disease yesterday.  Dr. Daiva Eves was able to make recommendations about antibiotics and duration of treatment.  I appreciate his consultation on this patient.  Her vital signs are stable.  I did change the dressing with her left knee today and I am concerned about continued drainage as well as an eschar to the medial aspect of her incision.  A new CBC is pending this morning.  Her potassium is normal.  From my standpoint, I feel that a repeat I&D of the left knee is warranted.  I will discuss this with family.  My plan will be to proceed to surgery late tomorrow.  I am not able to proceed with surgery this evening because of surgical cases that are taking me into the evening at [facility].  My plan will be for repeat I&D likely at the end of the day tomorrow for the left knee.  I did talk to the patient about this.

## 2020-01-30 ENCOUNTER — Encounter (HOSPITAL_COMMUNITY)
Admission: RE | Disposition: A | Discharge status: Home Health | Payer: Self-pay | Source: Home / Self Care | Attending: Orthopaedic Surgery

## 2020-01-30 ENCOUNTER — Encounter (HOSPITAL_COMMUNITY): Payer: Self-pay | Admitting: Orthopaedic Surgery

## 2020-01-30 ENCOUNTER — Inpatient Hospital Stay (HOSPITAL_COMMUNITY): Payer: Medicare Other | Admitting: Certified Registered Nurse Anesthetist

## 2020-01-30 HISTORY — PX: IRRIGATION AND DEBRIDEMENT KNEE: SHX5185

## 2020-01-30 SURGERY — IRRIGATION AND DEBRIDEMENT KNEE
Anesthesia: General | Site: Knee | Laterality: Left

## 2020-01-30 MED ORDER — FENTANYL CITRATE (PF) 100 MCG/2ML IJ SOLN
25.0000 ug | INTRAMUSCULAR | Status: DC | PRN
  Administered 2020-01-30 (×2): 50 ug via INTRAVENOUS

## 2020-01-30 MED ORDER — ONDANSETRON HCL 4 MG/2ML IJ SOLN: 4.0000 mg | Freq: Once | INTRAMUSCULAR | Status: DC | PRN

## 2020-01-30 MED ORDER — HYDROMORPHONE HCL 1 MG/ML IJ SOLN
INTRAMUSCULAR | Status: AC
  Administered 2020-01-31: 1 mg via INTRAVENOUS
  Filled 2020-01-30: qty 1

## 2020-01-30 MED ORDER — ACETAMINOPHEN 10 MG/ML IV SOLN
1000.0000 mg | Freq: Once | INTRAVENOUS | Status: DC | PRN
  Administered 2020-01-30: 1000 mg via INTRAVENOUS

## 2020-01-30 MED ORDER — SODIUM CHLORIDE 0.9 % IR SOLN
Status: DC | PRN
  Administered 2020-01-30: 3000 mL

## 2020-01-30 MED ORDER — ACETAMINOPHEN 325 MG PO TABS: 325.0000 mg | ORAL_TABLET | ORAL | Status: DC | PRN

## 2020-01-30 MED ORDER — OXYCODONE HCL 5 MG/5ML PO SOLN: 5.0000 mg | Freq: Once | ORAL | Status: DC | PRN

## 2020-01-30 MED ORDER — METHOCARBAMOL 500 MG IVPB - SIMPLE MED
INTRAVENOUS | Status: AC
  Filled 2020-01-30: qty 50

## 2020-01-30 MED ORDER — PROPOFOL 10 MG/ML IV BOLUS
INTRAVENOUS | Status: DC | PRN
  Administered 2020-01-30: 100 mg via INTRAVENOUS

## 2020-01-30 MED ORDER — LIDOCAINE HCL (PF) 2 % IJ SOLN
INTRAMUSCULAR | Status: AC
  Filled 2020-01-30: qty 5

## 2020-01-30 MED ORDER — OXYCODONE HCL 5 MG PO TABS: 5.0000 mg | ORAL_TABLET | Freq: Once | ORAL | Status: DC | PRN

## 2020-01-30 MED ORDER — FENTANYL CITRATE (PF) 100 MCG/2ML IJ SOLN
INTRAMUSCULAR | Status: AC
  Filled 2020-01-30: qty 2

## 2020-01-30 MED ORDER — FENTANYL CITRATE (PF) 100 MCG/2ML IJ SOLN
INTRAMUSCULAR | Status: DC | PRN
  Administered 2020-01-30: 50 ug via INTRAVENOUS
  Administered 2020-01-30 (×2): 25 ug via INTRAVENOUS

## 2020-01-30 MED ORDER — LIDOCAINE 2% (20 MG/ML) 5 ML SYRINGE
INTRAMUSCULAR | Status: DC | PRN
  Administered 2020-01-30: 60 mg via INTRAVENOUS

## 2020-01-30 MED ORDER — MEPERIDINE HCL 50 MG/ML IJ SOLN
INTRAMUSCULAR | Status: AC
  Filled 2020-01-30: qty 1

## 2020-01-30 MED ORDER — PHENYLEPHRINE 40 MCG/ML (10ML) SYRINGE FOR IV PUSH (FOR BLOOD PRESSURE SUPPORT)
PREFILLED_SYRINGE | INTRAVENOUS | Status: AC
  Filled 2020-01-30: qty 10

## 2020-01-30 MED ORDER — ACETAMINOPHEN 160 MG/5ML PO SOLN: 325.0000 mg | ORAL | Status: DC | PRN

## 2020-01-30 MED ORDER — MEPERIDINE HCL 50 MG/ML IJ SOLN: 6.2500 mg | INTRAMUSCULAR | Status: DC | PRN

## 2020-01-30 MED ORDER — DEXAMETHASONE SODIUM PHOSPHATE 10 MG/ML IJ SOLN
INTRAMUSCULAR | Status: AC
  Filled 2020-01-30: qty 1

## 2020-01-30 MED ORDER — LACTATED RINGERS IV SOLN: INTRAVENOUS | Status: DC

## 2020-01-30 MED ORDER — VANCOMYCIN HCL 1000 MG IV SOLR
INTRAVENOUS | Status: DC | PRN
  Administered 2020-01-30: 1000 mg

## 2020-01-30 MED ORDER — ACETAMINOPHEN 10 MG/ML IV SOLN
INTRAVENOUS | Status: AC
  Filled 2020-01-30: qty 100

## 2020-01-30 MED ORDER — VANCOMYCIN HCL 1000 MG IV SOLR
INTRAVENOUS | Status: AC
  Filled 2020-01-30: qty 1000

## 2020-01-30 MED ORDER — MIDAZOLAM HCL 5 MG/5ML IJ SOLN
INTRAMUSCULAR | Status: DC | PRN
  Administered 2020-01-30: 2 mg via INTRAVENOUS

## 2020-01-30 MED ORDER — HYDROMORPHONE HCL 1 MG/ML IJ SOLN
0.2500 mg | INTRAMUSCULAR | Status: DC | PRN
  Administered 2020-01-30: 0.5 mg via INTRAVENOUS
  Administered 2020-01-30 (×2): 0.25 mg via INTRAVENOUS

## 2020-01-30 MED ORDER — PROPOFOL 10 MG/ML IV BOLUS
INTRAVENOUS | Status: AC
  Filled 2020-01-30: qty 20

## 2020-01-30 MED ORDER — CHLORHEXIDINE GLUCONATE 0.12 % MT SOLN
15.0000 mL | Freq: Once | OROMUCOSAL | Status: AC
  Administered 2020-01-30: 15 mL via OROMUCOSAL

## 2020-01-30 MED ORDER — 0.9 % SODIUM CHLORIDE (POUR BTL) OPTIME
TOPICAL | Status: DC | PRN
  Administered 2020-01-30: 1000 mL

## 2020-01-30 MED ORDER — ONDANSETRON HCL 4 MG/2ML IJ SOLN
INTRAMUSCULAR | Status: DC | PRN
  Administered 2020-01-30: 4 mg via INTRAVENOUS

## 2020-01-30 MED ORDER — MIDAZOLAM HCL 2 MG/2ML IJ SOLN
INTRAMUSCULAR | Status: AC
  Filled 2020-01-30: qty 2

## 2020-01-30 MED ORDER — PHENYLEPHRINE 40 MCG/ML (10ML) SYRINGE FOR IV PUSH (FOR BLOOD PRESSURE SUPPORT)
PREFILLED_SYRINGE | INTRAVENOUS | Status: DC | PRN
  Administered 2020-01-30: 80 ug via INTRAVENOUS

## 2020-01-30 MED ORDER — ONDANSETRON HCL 4 MG/2ML IJ SOLN
INTRAMUSCULAR | Status: AC
  Filled 2020-01-30: qty 2

## 2020-01-30 SURGICAL SUPPLY — 53 items
ADH SKN CLS APL DERMABOND .7 (GAUZE/BANDAGES/DRESSINGS) ×1
BAG SPEC THK2 15X12 ZIP CLS (MISCELLANEOUS) ×1
BAG ZIPLOCK 12X15 (MISCELLANEOUS) ×2 IMPLANT
BANDAGE ESMARK 6X9 LF (GAUZE/BANDAGES/DRESSINGS) ×1 IMPLANT
BNDG CMPR 9X6 STRL LF SNTH (GAUZE/BANDAGES/DRESSINGS) ×1
BNDG ELASTIC 6X5.8 VLCR STR LF (GAUZE/BANDAGES/DRESSINGS) ×2 IMPLANT
BNDG ESMARK 6X9 LF (GAUZE/BANDAGES/DRESSINGS) ×2
COVER SURGICAL LIGHT HANDLE (MISCELLANEOUS) ×2 IMPLANT
COVER WAND RF STERILE (DRAPES) IMPLANT
CUFF TOURN SGL QUICK 34 (TOURNIQUET CUFF) ×2
CUFF TRNQT CYL 34X4.125X (TOURNIQUET CUFF) ×1 IMPLANT
DERMABOND ADVANCED (GAUZE/BANDAGES/DRESSINGS) ×1
DERMABOND ADVANCED .7 DNX12 (GAUZE/BANDAGES/DRESSINGS) ×1 IMPLANT
DRAPE SHEET LG 3/4 BI-LAMINATE (DRAPES) ×2 IMPLANT
DRSG ADAPTIC 3X8 NADH LF (GAUZE/BANDAGES/DRESSINGS) ×2 IMPLANT
DRSG AQUACEL AG ADV 3.5X10 (GAUZE/BANDAGES/DRESSINGS) ×2 IMPLANT
DRSG PAD ABDOMINAL 8X10 ST (GAUZE/BANDAGES/DRESSINGS) ×2 IMPLANT
DRSG TEGADERM 4X4.75 (GAUZE/BANDAGES/DRESSINGS) ×2 IMPLANT
DRSG VAC ATS MED SENSATRAC (GAUZE/BANDAGES/DRESSINGS) ×1 IMPLANT
DRSG VAC ATS SM SENSATRAC (GAUZE/BANDAGES/DRESSINGS) ×1 IMPLANT
DURAPREP 26ML APPLICATOR (WOUND CARE) ×2 IMPLANT
ELECT REM PT RETURN 15FT ADLT (MISCELLANEOUS) ×2 IMPLANT
EVACUATOR 1/8 PVC DRAIN (DRAIN) ×2 IMPLANT
GAUZE SPONGE 2X2 8PLY STRL LF (GAUZE/BANDAGES/DRESSINGS) ×1 IMPLANT
GAUZE SPONGE 4X4 12PLY STRL (GAUZE/BANDAGES/DRESSINGS) ×2 IMPLANT
GLOVE BIO SURGEON STRL SZ7.5 (GLOVE) ×2 IMPLANT
GLOVE BIOGEL PI IND STRL 8 (GLOVE) ×2 IMPLANT
GLOVE BIOGEL PI INDICATOR 8 (GLOVE) ×2
GLOVE ECLIPSE 8.0 STRL XLNG CF (GLOVE) IMPLANT
GOWN SPEC L3 XXLG W/TWL (GOWN DISPOSABLE) ×4 IMPLANT
GOWN STRL REUS W/TWL LRG LVL3 (GOWN DISPOSABLE) ×2 IMPLANT
HANDPIECE INTERPULSE COAX TIP (DISPOSABLE) ×2
JET LAVAGE IRRISEPT WOUND (IRRIGATION / IRRIGATOR) ×2
KIT BASIN OR (CUSTOM PROCEDURE TRAY) ×2 IMPLANT
KIT TURNOVER KIT A (KITS) IMPLANT
LAVAGE JET IRRISEPT WOUND (IRRIGATION / IRRIGATOR) IMPLANT
MANIFOLD NEPTUNE II (INSTRUMENTS) ×2 IMPLANT
PACK TOTAL JOINT (CUSTOM PROCEDURE TRAY) ×2 IMPLANT
PADDING CAST COTTON 6X4 STRL (CAST SUPPLIES) ×2 IMPLANT
PENCIL SMOKE EVACUATOR (MISCELLANEOUS) IMPLANT
PROTECTOR NERVE ULNAR (MISCELLANEOUS) ×2 IMPLANT
SET HNDPC FAN SPRY TIP SCT (DISPOSABLE) ×1 IMPLANT
SPONGE GAUZE 2X2 STER 10/PKG (GAUZE/BANDAGES/DRESSINGS) ×1
STAPLER VISISTAT 35W (STAPLE) ×2 IMPLANT
SUT ETHIBOND NAB CT1 #1 30IN (SUTURE) ×2 IMPLANT
SUT ETHILON 2 0 PSLX (SUTURE) ×4 IMPLANT
SUT MNCRL AB 4-0 PS2 18 (SUTURE) ×2 IMPLANT
SUT VIC AB 1 CT1 36 (SUTURE) ×4 IMPLANT
SUT VIC AB 2-0 CT1 27 (SUTURE) ×6
SUT VIC AB 2-0 CT1 TAPERPNT 27 (SUTURE) ×3 IMPLANT
SWAB COLLECTION DEVICE MRSA (MISCELLANEOUS) ×2 IMPLANT
SWAB CULTURE ESWAB REG 1ML (MISCELLANEOUS) ×2 IMPLANT
TOWEL OR 17X26 10 PK STRL BLUE (TOWEL DISPOSABLE) ×4 IMPLANT

## 2020-01-30 NOTE — Progress Notes (Signed)
Patient ID: Crystal Patterson, female   DOB: 08-27-1965, 54 y.o.   MRN: [id number] The patient understands that my plan is to proceed to the operating room this evening for repeat irrigation and debridement of her left knee to try to make sure that the infection gets under control.  She continues on her IV antibiotics and I appreciate the infectious these consults recommendations.  I am concerned about her white blood cell count still being high.  Unless is concerned about her incision.  My plan this evening will be to thoroughly wash out the knee again and then to likely place an incisional VAC over the knee to try to get the soft tissue to heal.  This will likely need to keep her in the hospital for several more days.  Surgery will be this evening.  She can have a light breakfast this morning since surgery will be after 5 PM.

## 2020-01-30 NOTE — Progress Notes (Signed)
Patient guarding left knee and grunting upon entering room.  Left knee surgical site currently wrapped with ace bandage.  Ice removed and pressure equipment in place.  Patient up to bedside commode with 2 assist and voided X 1.  Patient assisted back to bed.  Clear liquid breakfast ordered per MD request.  Patient c/o pain level 8 on 0-10 scale.  Patient repositioned and left leg floated with pillow to assist with pain.  Dressing to left knee is clean, dry, and intact.

## 2020-01-30 NOTE — Brief Op Note (Signed)
01/25/2020 - 01/30/2020  7:20 PM  PATIENT:  Crystal Patterson  54 y.o. female  PRE-OPERATIVE DIAGNOSIS:  left total knee infection  POST-OPERATIVE DIAGNOSIS:  left total knee infection  PROCEDURE:  Procedure(s): REPEAT IRRIGATION AND DEBRIDEMENT LEFT KNEE PLACEMENT OF WOUND VAC (Left)  SURGEON:  Surgeon(s) and Role:    [name], MD - Primary  ANESTHESIA:   general  COUNTS:  YES  DICTATION: .Other Dictation: Dictation Number [id number]  PLAN OF CARE: Admit to inpatient   PATIENT DISPOSITION:  PACU - hemodynamically stable.   Delay start of Pharmacological VTE agent (>24hrs) due to surgical blood loss or risk of bleeding: no

## 2020-01-30 NOTE — Transfer of Care (Signed)
Immediate Anesthesia Transfer of Care Note  Patient: Crystal Patterson  Procedure(s) Performed: REPEAT IRRIGATION AND DEBRIDEMENT LEFT KNEE PLACEMENT OF WOUND VAC (Left Knee)  Patient Location: PACU  Anesthesia Type:General  Level of Consciousness: awake, alert  and oriented  Airway & Oxygen Therapy: Patient Spontanous Breathing and Patient connected to face mask oxygen  Post-op Assessment: Report given to RN and Post -op Vital signs reviewed and stable  Post vital signs: Reviewed and stable  Last Vitals:  Vitals Value Taken Time  BP 142/91 01/30/20 1923  Temp    Pulse 78 01/30/20 1925  Resp 12 01/30/20 1925  SpO2 100 % 01/30/20 1925  Vitals shown include unvalidated device data.  Last Pain:  Vitals:   01/30/20 1700  TempSrc: Oral  PainSc:       Patients Stated Pain Goal: 4 (01/30/20 1509)  Complications: No complications documented.

## 2020-01-30 NOTE — Anesthesia Preprocedure Evaluation (Addendum)
Anesthesia Evaluation  Patient identified by MRN, date of birth, ID band Patient awake    Reviewed: Allergy & Precautions, H&P , NPO status , Patient's Chart, lab work & pertinent test results  Airway Mallampati: I  TM Distance: >3 FB Neck ROM: Full    Dental  (+) Teeth Intact, Dental Advisory Given   Pulmonary Current Smoker,    Pulmonary exam normal        Cardiovascular hypertension, Pt. on medications and Pt. on home beta blockers + CAD  negative cardio ROS   Rhythm:Regular Rate:Normal     Neuro/Psych  Headaches, PSYCHIATRIC DISORDERS Depression negative neurological ROS     GI/Hepatic Neg liver ROS, GERD  Medicated,  Endo/Other  negative endocrine ROS  Renal/GU negative Renal ROS  negative genitourinary   Musculoskeletal  (+) Arthritis , Left knee infection s/p TKR 11/21   Abdominal (+)  Abdomen: soft. Bowel sounds: normal.  Peds negative pediatric ROS (+)  Hematology negative hematology ROS (+)   Anesthesia Other Findings   Reproductive/Obstetrics negative OB ROS                           Anesthesia Physical  Anesthesia Plan  ASA: III  Anesthesia Plan: General   Post-op Pain Management:    Induction: Intravenous and Cricoid pressure planned  PONV Risk Score and Plan: 2 and Ondansetron, Dexamethasone and Treatment may vary due to age or medical condition  Airway Management Planned: Oral ETT  Additional Equipment: None  Intra-op Plan:   Post-operative Plan:   Informed Consent: I have reviewed the patients History and Physical, chart, labs and discussed the procedure including the risks, benefits and alternatives for the proposed anesthesia with the patient or authorized representative who has indicated his/her understanding and acceptance.     Dental advisory given  Plan Discussed with: CRNA and Anesthesiologist  Anesthesia Plan Comments:       Anesthesia  Quick Evaluation

## 2020-01-30 NOTE — Progress Notes (Signed)
Patient ID: Crystal Patterson, female   DOB: 01-Aug-1965, 54 y.o.   MRN: [id number] Once again, my plan is to proceed to surgery this evening for repeat irrigation debridement of the patient's left knee to continue to try to get the infection under control.  I am concerned about her elevated white count and continued drainage from this knee.  I am also concerned about her incision breaking down.  I will likely have to place some type of incisional VAC over the incision after closing it.  The risk and benefits of surgery have been explained in detail.

## 2020-01-30 NOTE — Anesthesia Procedure Notes (Signed)
Procedure Name: LMA Insertion Date/Time: 01/30/2020 5:58 PM Performed by: Audon Heymann D, CRNA Pre-anesthesia Checklist: Patient identified, Emergency Drugs available, Suction available and Patient being monitored Patient Re-evaluated:Patient Re-evaluated prior to induction Oxygen Delivery Method: Circle system utilized Preoxygenation: Pre-oxygenation with 100% oxygen Induction Type: IV induction Ventilation: Mask ventilation without difficulty LMA: LMA inserted LMA Size: 4.0 Tube type: Oral Number of attempts: 1 Placement Confirmation: positive ETCO2 and breath sounds checked- equal and bilateral Tube secured with: Tape Dental Injury: Teeth and Oropharynx as per pre-operative assessment

## 2020-01-30 NOTE — Anesthesia Postprocedure Evaluation (Signed)
Anesthesia Post Note  Patient: Crystal Patterson  Procedure(s) Performed: REPEAT IRRIGATION AND DEBRIDEMENT LEFT KNEE PLACEMENT OF WOUND VAC (Left Knee)     Patient location during evaluation: PACU Anesthesia Type: General Level of consciousness: awake and alert Pain management: pain level controlled Vital Signs Assessment: post-procedure vital signs reviewed and stable Respiratory status: spontaneous breathing, nonlabored ventilation, respiratory function stable and patient connected to nasal cannula oxygen Cardiovascular status: blood pressure returned to baseline and stable Postop Assessment: no apparent nausea or vomiting Anesthetic complications: no   No complications documented.  Last Vitals:  Vitals:   01/30/20 1700 01/30/20 1923  BP: 111/70 (!) 142/91  Pulse: 72 78  Resp: 18 10  Temp: 36.7 C 36.4 C  SpO2:  100%    Last Pain:  Vitals:   01/30/20 1700  TempSrc: Oral  PainSc:                  [name]

## 2020-01-30 NOTE — Plan of Care (Signed)
  Problem: Education: Goal: Knowledge of General Education information will improve Description: Including pain rating scale, medication(s)/side effects and non-pharmacologic comfort measures Outcome: Progressing   Problem: Clinical Measurements: Goal: Will remain free from infection Outcome: Progressing   Problem: Pain Managment: Goal: General experience of comfort will improve Outcome: Progressing   

## 2020-01-30 NOTE — Progress Notes (Signed)
Patient sitting up in bed finishing up jello per clear liquid diet.  Now switched back to NPO for upcoming procedure per MD.  Some meds given as ordered others held due to NPO status.  Given PRN pain med via IV.  Per patient pain level is now 7 on 0-10 pain scale.  Patient continues to guard area but no longer grunting.  Bed at lowest position with side rail up.  Call light within reach and bed alarms on and audible.

## 2020-01-31 ENCOUNTER — Encounter (HOSPITAL_COMMUNITY): Payer: Self-pay | Admitting: Orthopaedic Surgery

## 2020-01-31 LAB — AEROBIC/ANAEROBIC CULTURE W GRAM STAIN (SURGICAL/DEEP WOUND): Gram Stain: NONE SEEN

## 2020-01-31 LAB — CBC
HCT: 35 % — ABNORMAL LOW (ref 36.0–46.0)
Hemoglobin: 11 g/dL — ABNORMAL LOW (ref 12.0–15.0)
MCH: 30.6 pg (ref 26.0–34.0)
MCHC: 31.4 g/dL (ref 30.0–36.0)
MCV: 97.5 fL (ref 80.0–100.0)
Platelets: 212 10*3/uL (ref 150–400)
RBC: 3.59 MIL/uL — ABNORMAL LOW (ref 3.87–5.11)
RDW: 14.2 % (ref 11.5–15.5)
WBC: 14.9 10*3/uL — ABNORMAL HIGH (ref 4.0–10.5)
nRBC: 0 % (ref 0.0–0.2)

## 2020-01-31 MED ORDER — ALTEPLASE 2 MG IJ SOLR
2.0000 mg | Freq: Once | INTRAMUSCULAR | Status: AC
  Administered 2020-01-31: 2 mg
  Filled 2020-01-31: qty 2

## 2020-01-31 MED ORDER — STERILE WATER FOR INJECTION IJ SOLN
INTRAMUSCULAR | Status: AC
  Filled 2020-01-31: qty 10

## 2020-01-31 NOTE — Progress Notes (Signed)
      INFECTIOUS DISEASE ATTENDING ADDENDUM:   Date: 01/31/2020  Patient name: Crystal Patterson  Medical record number: [id number]  Date of birth: [birth year]   Dr. Eliberto Ivory note from today reviewed  It sounds as if patient may need further I and D and plastics  There is no new culture data at present  I would stick with plan as currently configured and followup with [place] in clinic  We can certainly extend the stop date on her antibiotics if she does have another I and D next week  I will sign off for now  Please call with further questions.  Acey Lav 01/31/2020, 10:57 AM

## 2020-01-31 NOTE — Care Management Important Message (Signed)
Important Message  Patient Details IM Letter given to the Patient. Name: Crystal Patterson MRN: [id number] Date of Birth: 1965/05/20   Medicare Important Message Given:  Yes     Caren Macadam 01/31/2020, 1:16 PM

## 2020-01-31 NOTE — Op Note (Signed)
NAME: Crystal Patterson, Crystal Patterson MEDICAL RECORD [id number] ACCOUNT [id number] DATE OF BIRTH:June 14, 1965 FACILITY: [facility] LOCATION: [id number] PHYSICIAN:Jacobey Gura Aretha Parrot, MD  OPERATIVE REPORT  DATE OF PROCEDURE:  01/30/2020  PREOPERATIVE DIAGNOSIS:  Left total knee joint infection.  POSTOPERATIVE DIAGNOSIS:  Left total knee joint infection.  PROCEDURES: 1.  Repeat irrigation and debridement of left knee. 2.  Sharp excisional debridement of necrotic skin over an area of 3 cm x 6 cm. 3.  VAC sponge placement, left knee incision and wound.  SURGEON:  Vanita Panda. Magnus Ivan, MD  ANESTHESIA:  General.  ANTIBIOTICS:  2 grams IV Ancef.  ESTIMATED BLOOD LOSS:  Less than 100 mL.  COMPLICATIONS:  None.  INDICATIONS:  The patient is a 54 year old female who underwent a left total knee arthroplasty in early November.  This was a primary total knee replacement.  Her only comorbidity was significant lupus.  She is not a diabetic, and a swab negative for  MRSA or staph.  Three weeks later, I did take her to the operating room for a draining wound.  We found gram-negative rods in the wound and ended up growing out Proteus and E. coli.  Infectious disease has been consulted as well.  This is a return trip  to the OR.  I am concerned about the drainage she is still having from her knee and the higher white blood cell count.  There was also an area of necrosis at the inferior aspect of her incision.  I explained the rationale behind proceeding with surgery  in detail, and she does wish to proceed.  DESCRIPTION OF PROCEDURE:  After informed consent was obtained and appropriate left knee was marked, she was brought to the operating room and placed supine on the operating room table.  General anesthesia was then obtained.  A timeout was called.  She  was identified as correct patient, correct left knee after prepping with Betadine scrub and paint.  I then removed all the previous sutures from the knee, and  there was still a lot of drainage from the knee itself.  I used a #10 blade to sharply excise  necrotic skin, especially at the inferior aspect of her wound.  I then measured an area of 3 cm x 6 cm.  I then irrigated the subcutaneous and deep tissue above the knee joint with 3 liters normal saline solution using pulsatile lavage.  I then opened up  the knee joint itself and irrigated another 3 liters of normal saline solution through the knee joint followed by chlorhexidine irrigant that I left in the knee joint for a few minutes.  I then irrigated this all out and drained all the fluid I could  from the knee.  I then placed vancomycin powder within the arthrotomy.  I closed the arthrotomy with interrupted #1 Ethibond suture.  Some of the deep tissue was closed with 0 Vicryl.  I reapproximated as much of the skin as I could with interrupted 2-0  nylon suture.  I placed an incisional VAC over the superior and middle aspects of the incision and had to place a VAC in the distal wound that was 3 cm x 6 cm over the patella tendon.  The VAC was set to -125 mm of pressure and had a good suction and  seal.  The patient was then awakened, extubated, and taken to recovery room in stable condition with all final counts being correct.  There were no complications noted.  Postoperatively, she will continue IV antibiotics and  VAC treatment for this wound.   Once we are getting the infection under control, I will likely need to get the plastic surgery service involved for eventual wound coverage.  IN/NUANCE  D:01/30/2020 T:01/31/2020 JOB:[id number]

## 2020-01-31 NOTE — Progress Notes (Signed)
Physical Therapy Treatment Patient Details Name: Crystal Patterson MRN: [id number] DOB: [birth year] Today's Date: 01/31/2020    History of Present Illness Patient is 54 y.o. female s/p L TKR I&D with liner replacement and is also s/p Lt TKA on 01/04/20 with PMH significant for OA, HTN, Lupus.    PT Comments    POD # 1 L knee I&D with VAC placement.  This is pt second I&D since her TKR 01/04/20. Assisted back to bed and positioned to comfort.    Follow Up Recommendations  Home health PT;Supervision/Assistance - 24 hour;Other (comment) (plans to go to sisters house and will have 24/7 help with daughter during day while sister works)     Furniture conservator/restorer  None recommended by PT    Recommendations for Other Services       Precautions / Restrictions Precautions Precautions: Fall;Knee Precaution Comments: gentle ROM "nothing aggressive" per Ortho order Required Braces or Orthoses: Knee Immobilizer - Right Restrictions Weight Bearing Restrictions: No Other Position/Activity Restrictions: WBAT    Mobility  Bed Mobility + 2 assist side to side scooting to HOB pt <5% Positioned to comfort  Transfers Lateral scoot from recliner to bed + 2 assist as pt was too fatigued to attempt sit to stand this afternoon   Ambulation/Gait    need to get her to mobilize if she plans to go to her sisters           Stairs             Wheelchair Mobility    Modified Rankin (Stroke Patients Only)       Balance                                            Cognition Arousal/Alertness: Awake/alert Behavior During Therapy: WFL for tasks assessed/performed Overall Cognitive Status: Within Functional Limits for tasks assessed                                 General Comments: AxO x 3 pleasant/cooperative but in a lot pain and requiring a lot of assist      Exercises      General Comments        Pertinent Vitals/Pain Pain Assessment:  0-10 Pain Score: 8  Pain Location: L knee with activity Pain Descriptors / Indicators: Grimacing;Guarding;Sore;Tender Pain Intervention(s): Monitored during session;Premedicated before session;Repositioned;Ice applied    Home Living                      Prior Function            PT Goals (current goals can now be found in the care plan section) Progress towards PT goals: Progressing toward goals    Frequency    7X/week      PT Plan Discharge plan needs to be updated    Co-evaluation              AM-PAC PT "6 Clicks" Mobility   Outcome Measure  Help needed turning from your back to your side while in a flat bed without using bedrails?: A Lot Help needed moving from lying on your back to sitting on the side of a flat bed without using bedrails?: A Lot Help needed moving to and from a bed to a chair (including a wheelchair)?: A  Lot Help needed standing up from a chair using your arms (e.g., wheelchair or bedside chair)?: A Lot Help needed to walk in hospital room?: A Lot Help needed climbing 3-5 steps with a railing? : Total 6 Click Score: 11    End of Session Equipment Utilized During Treatment: Gait belt Activity Tolerance: Patient limited by fatigue;Patient limited by pain Patient left: in bed Nurse Communication: Mobility status PT Visit Diagnosis: Muscle weakness (generalized) (M62.81);Difficulty in walking, not elsewhere classified (R26.2)     Time: 0340-3524 PT Time Calculation (min) (ACUTE ONLY): 10 min  Charges:  $Therapeutic Activity: 8-22 mins                     Felecia Shelling  PTA Acute  Rehabilitation Services Pager      [phone] Office      [phone]

## 2020-01-31 NOTE — Progress Notes (Signed)
Patient ID: Crystal Patterson, female   DOB: 01/10/1966, 54 y.o.   MRN: [id number] I took the patient to the OR again last night for repeat I&D of her left knee.  I still found soupy fluid within the knee itself.  There was also a large inferior/medial eschar that I had to excise.  I thoroughly washed out the knee again and placed Vanco powder in the arthrotomy.  Unfortunately there is now a 3 cm x 6 cm open wound over the patella tendon that I cannot close.  The remainder of the incision was closed.  I have a VAC sponge over this area.  I will need to keep the VAC sponge on for several days and likely return to the operating room early next week for another I&D.  I will also need to get plastic surgery involved at some point to assist with some type of closure.  I will repeat her CBC in the morning to make sure that the white count hopefully will start trending down.

## 2020-01-31 NOTE — Progress Notes (Signed)
Physical Therapy Treatment Patient Details Name: Crystal Patterson MRN: [id number] DOB: 06/27/1965 Today's Date: 01/31/2020    History of Present Illness Patient is 54 y.o. female s/p L TKR I&D with liner replacement and is also s/p Lt TKA on 01/04/20 with PMH significant for OA, HTN, Lupus.    PT Comments    POD # 1 L knee I&D with VAC placement.  This is pt second I&D since her TKR 01/04/20.   Pt plans to: Home health PT;Supervision/Assistance - 24 hour;Other (comment) (plans to go to sisters house and will have 24/7 help with daughter during day while sister works) This needs to be confirmed. Assisted pt OOB required + 2 assist.  General bed mobility comments: required increased assist this session.  Pt unable to move L LE on her own.  Also used bed pad to complete scooting to EOB while offing Total Assist to support L LE and slowly/gently lower to floor.General transfer comment: pt was able to stand with + 2 side by side assist and tolerate PWB through L LE.  Did require increased assist to weight shift and take a few side steps from bed to recliner.  Pt reported 10/10 pain with standing.  Limited activity tolerance due to pain and overall feels "bad".  Assisted to recliner stand to sit with full support L LE to avoid excessive knee bending and minimize pain increase.  Did not apply KI for transfer only. Increased difficulty perform sit to stand and stand to sit with KI on as pt is unable to self lift leg.  Will need to attempt gait next session.  Positioned in recliner to comfort and performed a few gentle TE's of AP, knee presses followed by ICE.  Tolerated well.    Follow Up Recommendations  Home health PT;Supervision/Assistance - 24 hour;Other (comment) (plans to go to sisters house and will have 24/7 help with daughter during day while sister works)     Furniture conservator/restorer  None recommended by PT    Recommendations for Other Services       Precautions / Restrictions  Precautions Precautions: Fall;Knee Precaution Comments: gentle ROM "nothing aggressive" per Ortho order Required Braces or Orthoses: Knee Immobilizer - Right Restrictions Weight Bearing Restrictions: No Other Position/Activity Restrictions: WBAT    Mobility  Bed Mobility Overal bed mobility: Needs Assistance Bed Mobility: Supine to Sit     Supine to sit: Max assist;Total assist;+2 for physical assistance;+2 for safety/equipment     General bed mobility comments: required increased assist this session.  Pt unable to move L LE on her own.  Also used bed pad to complete scooting to EOB while offing Total Assist to support L LE and slowly/gently lower to floor.  Transfers Overall transfer level: Needs assistance Equipment used: Rolling walker (2 wheeled) Transfers: Sit to/from [employer] Sit to Stand: Mod assist;+2 physical assistance;+2 safety/equipment Stand pivot transfers: Mod assist;Max assist;+2 physical assistance;+2 safety/equipment       General transfer comment: pt was able to stand with + 2 side by side assist and tolerate PWB through L LE.  Did require increased assist to weight shift and take a few side steps from bed to recliner.  Pt reported 10/10 pain with standing.  Limited activity tolerance due to pain and overall feels "bad".  Assisted to recliner stand to sit with full support L LE to avoid excessive knee bending and minimize pain increase.  Did not apply KI for transfer only.  Ambulation/Gait  General Gait Details: transfers only this session due to pain   Stairs             Wheelchair Mobility    Modified Rankin (Stroke Patients Only)       Balance                                            Cognition Arousal/Alertness: Awake/alert Behavior During Therapy: WFL for tasks assessed/performed Overall Cognitive Status: Within Functional Limits for tasks assessed                                  General Comments: AxO x 3 pleasant/cooperative but in a lot pain and requiring a lot of assist      Exercises      General Comments        Pertinent Vitals/Pain Pain Assessment: 0-10 Pain Score: 8  Pain Location: L knee with activity Pain Descriptors / Indicators: Grimacing;Guarding;Sore;Tender Pain Intervention(s): Monitored during session;Premedicated before session;Repositioned;Ice applied    Home Living                      Prior Function            PT Goals (current goals can now be found in the care plan section) Progress towards PT goals: Progressing toward goals    Frequency    7X/week      PT Plan Discharge plan needs to be updated    Co-evaluation              AM-PAC PT "6 Clicks" Mobility   Outcome Measure  Help needed turning from your back to your side while in a flat bed without using bedrails?: A Lot Help needed moving from lying on your back to sitting on the side of a flat bed without using bedrails?: A Lot Help needed moving to and from a bed to a chair (including a wheelchair)?: A Lot Help needed standing up from a chair using your arms (e.g., wheelchair or bedside chair)?: A Lot Help needed to walk in hospital room?: A Lot Help needed climbing 3-5 steps with a railing? : Total 6 Click Score: 11    End of Session Equipment Utilized During Treatment: Gait belt Activity Tolerance: Patient limited by fatigue;Patient limited by pain Patient left: in chair;with call bell/phone within reach;with chair alarm set Nurse Communication: Mobility status PT Visit Diagnosis: Muscle weakness (generalized) (M62.81);Difficulty in walking, not elsewhere classified (R26.2)     Time: 1324-4010 PT Time Calculation (min) (ACUTE ONLY): 25 min  Charges:  $Therapeutic Activity: 23-37 mins                     {Willem Klingensmith  PTA Acute  Rehabilitation Services Pager      [phone] Office      [phone]

## 2020-02-01 LAB — BASIC METABOLIC PANEL
Anion gap: 7 (ref 5–15)
BUN: 5 mg/dL — ABNORMAL LOW (ref 6–20)
CO2: 25 mmol/L (ref 22–32)
Calcium: 8 mg/dL — ABNORMAL LOW (ref 8.9–10.3)
Chloride: 100 mmol/L (ref 98–111)
Creatinine, Ser: 0.63 mg/dL (ref 0.44–1.00)
GFR, Estimated: >60 mL/min (ref >60)
Glucose, Bld: 94 mg/dL (ref 70–99)
Potassium: 3.3 mmol/L — ABNORMAL LOW (ref 3.5–5.1)
Sodium: 132 mmol/L — ABNORMAL LOW (ref 135–145)

## 2020-02-01 LAB — CBC
HCT: 29.7 % — ABNORMAL LOW (ref 36.0–46.0)
Hemoglobin: 9.4 g/dL — ABNORMAL LOW (ref 12.0–15.0)
MCH: 30.5 pg (ref 26.0–34.0)
MCHC: 31.6 g/dL (ref 30.0–36.0)
MCV: 96.4 fL (ref 80.0–100.0)
Platelets: 250 10*3/uL (ref 150–400)
RBC: 3.08 MIL/uL — ABNORMAL LOW (ref 3.87–5.11)
RDW: 14.2 % (ref 11.5–15.5)
WBC: 14.3 10*3/uL — ABNORMAL HIGH (ref 4.0–10.5)
nRBC: 0 % (ref 0.0–0.2)

## 2020-02-01 MED ORDER — BISACODYL 10 MG RE SUPP
10.0000 mg | Freq: Every day | RECTAL | Status: DC | PRN
  Filled 2020-02-01: qty 1

## 2020-02-01 MED ORDER — SORBITOL 70 % SOLN
15.0000 mL | Freq: Every day | Status: DC | PRN
  Filled 2020-02-01: qty 30

## 2020-02-01 MED ORDER — POLYETHYLENE GLYCOL 3350 17 G PO PACK
17.0000 g | PACK | Freq: Every day | ORAL | Status: DC | PRN
  Administered 2020-02-01 – 2020-02-03 (×3): 17 g via ORAL
  Filled 2020-02-01 (×3): qty 1

## 2020-02-01 NOTE — Progress Notes (Signed)
Patient ID: Crystal Patterson, female   DOB: 06/14/1965, 54 y.o.   MRN: [id number] The patient's vital signs remained stable.  Her white blood cell count was still just over 14,000.  She is also not had a bowel movement in 8 days.  We will work on her bowel regimen.  I am concerned about the high white blood cell count and would like to see this coming down.  I have ordered a repeat CBC for the morning.  The back over her incision as well as the distal open wound has had only mild output.  If things do not improve, there is a high likelihood that I may need to perform an excision arthroplasty and remove all components in place an antibiotic spacer.  I will discuss her situation more with her family tomorrow.

## 2020-02-01 NOTE — Progress Notes (Signed)
Physical Therapy Treatment Patient Details Name: Crystal Patterson MRN: [id number] DOB: August 22, 1965 Today's Date: 02/01/2020    History of Present Illness Patient is 54 y.o. female s/p L TKR I&D with liner replacement and is also s/p Lt TKA on 01/04/20 with PMH significant for OA, HTN, Lupus.    PT Comments    General bed mobility comments: applied KI in supine and instructed on need for OOB when walking for increased support.  Assisted to EOB required Max Assist with use of bed to complete swival/scooting and support L LE as pt was unable to self move (pain/weakness)   Once upright, pt was self able to sit at Supervision level. General transfer comment: from lower bed level pt required Mod Assist to stand with 50% VC's repeated to "push off from bed" as pt kept trying to pull herself using walker.  Also, had increased disficullty performing sit to stand with KI applied (L LE far out in front).  Posterior LOB/lean near fall back onto bed Therapist corrected.  Pt was able to rise better from recliner using B UE's on arm rest to push off.  Still, unsteady and required assist to prevent posterior LOB.  Asked pt to call daughter to arrange a time to meet for family ed as pt requires [facility] assist.  Daughter does not drive and can not make it today so she agreed to come Saturday at 2 pm.   Follow Up Recommendations  Home health PT;Supervision/Assistance - 24 hour;Other (comment)     Equipment Recommendations  None recommended by PT    Recommendations for Other Services       Precautions / Restrictions Precautions Precautions: Fall;Knee Precaution Comments: gentle ROM "nothing aggressive" per Ortho order Required Braces or Orthoses: Knee Immobilizer - Right Knee Immobilizer - Left: On when out of bed or walking Restrictions Weight Bearing Restrictions: No LLE Weight Bearing: Weight bearing as tolerated    Mobility  Bed Mobility Overal bed mobility: Needs Assistance Bed Mobility: Supine to  Sit     Supine to sit: Max assist     General bed mobility comments: applied KI in supine and instructed on need for OOB when walking for increased support.  Assisted to EOB required Max Assist with use of bed to complete swival/scooting and support L LE as pt was unable to self move (pain/weakness)   Once upright, pt was self able to sit at Supervision level.  Transfers Overall transfer level: Needs assistance Equipment used: Rolling walker (2 wheeled) Transfers: Sit to/from [employer] Sit to Stand: Mod assist Stand pivot transfers: Mod assist;Max assist       General transfer comment: from lower bed level pt required Mod Assist to stand with 50% VC's repeated to "push off from bed" as pt kept trying to pull herself using walker.  Also, had increased disficullty performing sit to stand with KI applied (L LE far out in front).  Posterior LOB/lean near fall back onto bed Therapist corrected.  Pt was able to rise better from recliner using B UE's on arm rest to push off.  Still, unsteady and required assist to prevent posterior LOB.  Ambulation/Gait Ambulation/Gait assistance: Min assist Gait Distance (Feet): 12 Feet (first 8 feet then another 4 feet.) Assistive device: Rolling walker (2 wheeled) Gait Pattern/deviations: Step-to pattern;Decreased step length - right;Decreased step length - left;Antalgic;Decreased weight shift to left Gait velocity: decreased   General Gait Details: very unsteady gait with difficulty advancing L LE (pain/weakness) KI did provide knee stability  and pt was able to weight bear functionally.  Limited amb distance due to effort, pain and fatigue.  Also not dyspnea RA 95% (Tobacco user)   Stairs             Wheelchair Mobility    Modified Rankin (Stroke Patients Only)       Balance                                            Cognition Arousal/Alertness: Awake/alert Behavior During Therapy: WFL for tasks  assessed/performed Overall Cognitive Status: Within Functional Limits for tasks assessed                                 General Comments: AxO x 3 pleasant/cooperative but in a lot pain and requiring a lot of assist      Exercises      General Comments        Pertinent Vitals/Pain Pain Assessment: 0-10 Pain Score: 8  Pain Location: L knee with activity Pain Descriptors / Indicators: Grimacing;Guarding;Sore;Tender Pain Intervention(s): Monitored during session;Premedicated before session;Repositioned;Ice applied    Home Living                      Prior Function            PT Goals (current goals can now be found in the care plan section) Progress towards PT goals: Progressing toward goals    Frequency    7X/week      PT Plan Discharge plan needs to be updated    Co-evaluation              AM-PAC PT "6 Clicks" Mobility   Outcome Measure  Help needed turning from your back to your side while in a flat bed without using bedrails?: A Lot Help needed moving from lying on your back to sitting on the side of a flat bed without using bedrails?: A Lot Help needed moving to and from a bed to a chair (including a wheelchair)?: A Lot Help needed standing up from a chair using your arms (e.g., wheelchair or bedside chair)?: A Lot Help needed to walk in hospital room?: A Lot Help needed climbing 3-5 steps with a railing? : Total 6 Click Score: 11    End of Session Equipment Utilized During Treatment: Gait belt Activity Tolerance: Patient limited by fatigue;Patient limited by pain Patient left: in chair;with call bell/phone within reach;with chair alarm set Nurse Communication: Mobility status PT Visit Diagnosis: Muscle weakness (generalized) (M62.81);Difficulty in walking, not elsewhere classified (R26.2)     Time: 4259-5638 PT Time Calculation (min) (ACUTE ONLY): 32 min  Charges:  $Gait Training: 8-22 mins $Therapeutic Activity: 8-22  mins                     Felecia Shelling  PTA Acute  Rehabilitation Services Pager      [phone] Office      [phone]

## 2020-02-02 LAB — CBC
HCT: 29.4 % — ABNORMAL LOW (ref 36.0–46.0)
Hemoglobin: 9.2 g/dL — ABNORMAL LOW (ref 12.0–15.0)
MCH: 30.1 pg (ref 26.0–34.0)
MCHC: 31.3 g/dL (ref 30.0–36.0)
MCV: 96.1 fL (ref 80.0–100.0)
Platelets: 239 10*3/uL (ref 150–400)
RBC: 3.06 MIL/uL — ABNORMAL LOW (ref 3.87–5.11)
RDW: 14.2 % (ref 11.5–15.5)
WBC: 12 10*3/uL — ABNORMAL HIGH (ref 4.0–10.5)
nRBC: 0 % (ref 0.0–0.2)

## 2020-02-02 NOTE — Progress Notes (Signed)
Physical Therapy Treatment Patient Details Name: Crystal Patterson MRN: [id number] DOB: 1965-05-01 Today's Date: 02/02/2020    History of Present Illness Patient is 54 y.o. female s/p L TKR I&D with liner replacement and is also s/p Lt TKA on 01/04/20 with PMH significant for OA, HTN, Lupus.    PT Comments    Pt met awake in bed with daughter present for family education session. The pt plans to live with her dtr upon D/C. The dtr assists pt OOB min/mod A with assist with her L LE. Dtr is instructed how to carefully maneuver pt's L LE in order to get pt EOB from supine in bed. Pt claims her pain is an 8/10. Therapist demonstrates with pt how to perform standing piv transfer to [facility] from EOB. Pt requires min/mod A for physical assistance due to pain and weakness. Dtr performs transfer to and from EOB and [facility] with therapist instructions. Dtr demo's good understanding of how to properly assist and guard her mother during xfers. Dtr educated on proper use of KI. Dter observes donning of KI and assists pt up to ambulate ~4 feet with chair follow utilized due to increased pain. Dtr educated on avoiding extreme flexion ROM in pt L knee, and safe therex that can be performed.  Follow Up Recommendations  Home health PT;Supervision/Assistance - 24 hour;Other (comment)     Equipment Recommendations  None recommended by PT    Recommendations for Other Services       Precautions / Restrictions Precautions Precautions: Knee;Fall Precaution Comments: gentle ROM "nothing aggressive" per Ortho order Required Braces or Orthoses: Knee Immobilizer - Left Knee Immobilizer - Left: On when out of bed or walking Restrictions Weight Bearing Restrictions: No LLE Weight Bearing: Weight bearing as tolerated Other Position/Activity Restrictions: WBAT    Mobility  Bed Mobility Overal bed mobility: Needs Assistance Bed Mobility: Sit to Supine     Supine to sit: Min assist     General bed mobility comments: Dtr  assisted pt hands on to get pt EOB while therapist instructed dtr  Transfers Overall transfer level: Needs assistance Equipment used: None Transfers: Sit to/from [facility] Sit to Stand: Min assist;Mod assist Stand pivot transfers: Min assist;Mod assist       General transfer comment: Pt demo's increased ability to assist with her transfers, even though her pain levels are higher today  Ambulation/Gait Ambulation/Gait assistance: Min assist;+2 safety/equipment;Mod assist Gait Distance (Feet): 3 Feet Assistive device: Rolling walker (2 wheeled) Gait Pattern/deviations: Step-to pattern;Decreased step length - right;Decreased step length - left;Antalgic;Decreased weight shift to left Gait velocity: decreased   General Gait Details: assisted with amb a few steps from [facility] Chair follow utilized due to increased pain   Stairs             Wheelchair Mobility    Modified Rankin (Stroke Patients Only)       Balance                                            Cognition Arousal/Alertness: Awake/alert Behavior During Therapy: WFL for tasks assessed/performed Overall Cognitive Status: Within Functional Limits for tasks assessed                                 General Comments: AxO x 3 very pleasant/cooperative but in  a lot pain and requiring a lot of assist      Exercises Total Joint Exercises Ankle Circles/Pumps: 20 reps;AROM;Both;Supine Quad Sets: AROM;5 reps;Right;Supine    General Comments        Pertinent Vitals/Pain Pain Assessment: 0-10 Pain Score: 8  Pain Location: L knee with activity Pain Descriptors / Indicators: Grimacing;Guarding;Sore;Tender    Home Living                      Prior Function            PT Goals (current goals can now be found in the care plan section) Acute Rehab PT Goals Patient Stated Goal: be able to walk with less pain PT Goal Formulation: With patient Time For  Goal Achievement: 01/11/20 Potential to Achieve Goals: Good Progress towards PT goals: Progressing toward goals    Frequency    7X/week      PT Plan Discharge plan needs to be updated    Co-evaluation              AM-PAC PT "6 Clicks" Mobility   Outcome Measure  Help needed turning from your back to your side while in a flat bed without using bedrails?: A Lot Help needed moving from lying on your back to sitting on the side of a flat bed without using bedrails?: A Lot Help needed moving to and from a bed to a chair (including a wheelchair)?: A Lot Help needed standing up from a chair using your arms (e.g., wheelchair or bedside chair)?: A Lot Help needed to walk in hospital room?: A Lot Help needed climbing 3-5 steps with a railing? : Total 6 Click Score: 11    End of Session Equipment Utilized During Treatment: Gait belt Activity Tolerance: Patient limited by fatigue;Patient limited by pain Patient left: with call bell/phone within reach;with family/visitor present;in chair;with chair alarm set Nurse Communication: Mobility status PT Visit Diagnosis: Muscle weakness (generalized) (M62.81);Difficulty in walking, not elsewhere classified (R26.2)     Time: 3295-1884 PT Time Calculation (min) (ACUTE ONLY): 40 min  Charges:  $Gait Training: 8-22 mins $Therapeutic Activity: 23-37 mins                     C. Parks [place], [place] Acute Rehab [phone]

## 2020-02-02 NOTE — Plan of Care (Signed)
  Problem: Activity: Goal: Risk for activity intolerance will decrease Outcome: Progressing   Problem: Elimination: Goal: Will not experience complications related to bowel motility Outcome: Progressing   Problem: Pain Managment: Goal: General experience of comfort will improve Outcome: Progressing   Problem: Clinical Measurements: Goal: Ability to maintain clinical measurements within normal limits will improve Outcome: Progressing

## 2020-02-02 NOTE — Progress Notes (Signed)
     Subjective: 3 Days Post-Op Procedure(s) (LRB): REPEAT IRRIGATION AND DEBRIDEMENT LEFT KNEE PLACEMENT OF WOUND VAC (Left) Awake, alert, pain is moderate. VAC in place and is drawing negative pressure.   Patient reports pain as marked.    Objective:   VITALS:  Temp:  [98 F (36.7 C)-99.3 F (37.4 C)] 98 F (36.7 C) (12/04 0838) Pulse Rate:  [59-69] 64 (12/04 0838) Resp:  [14-20] 14 (12/04 0838) BP: (92-105)/(61-75) 104/74 (12/04 0838) SpO2:  [97 %-100 %] 100 % (12/04 3419)  Neurologically intact ABD soft Neurovascular intact Sensation intact distally Intact pulses distally Dorsiflexion/Plantar flexion intact Incision: moderate drainage Compartment soft   LABS Recent Labs    01/31/20 0355 01/31/20 0355 02/01/20 0343 02/02/20 0406  HGB 11.0*  --  9.4* 9.2*  WBC 14.9*   < > 14.3* 12.0*  PLT 212   < > 250 239   < > = values in this interval not displayed.   Recent Labs    02/01/20 0343  NA 132*  K 3.3*  CL 100  CO2 25  BUN 5*  CREATININE 0.63  GLUCOSE 94   No results for input(s): LABPT, INR in the last 72 hours.   Assessment/Plan: 3 Days Post-Op Procedure(s) (LRB): REPEAT IRRIGATION AND DEBRIDEMENT LEFT KNEE PLACEMENT OF WOUND VAC (Left)  Advance diet Up with therapy Continue ABX therapy due to Post-op infection  [name] 02/02/2020, 10:02 AMPatient ID: Crystal Patterson, female   DOB: March 14, 1965, 54 y.o.   MRN: [id number]

## 2020-02-03 LAB — CBC
HCT: 29.7 % — ABNORMAL LOW (ref 36.0–46.0)
Hemoglobin: 9.3 g/dL — ABNORMAL LOW (ref 12.0–15.0)
MCH: 30.1 pg (ref 26.0–34.0)
MCHC: 31.3 g/dL (ref 30.0–36.0)
MCV: 96.1 fL (ref 80.0–100.0)
Platelets: 265 10*3/uL (ref 150–400)
RBC: 3.09 MIL/uL — ABNORMAL LOW (ref 3.87–5.11)
RDW: 14 % (ref 11.5–15.5)
WBC: 11.5 10*3/uL — ABNORMAL HIGH (ref 4.0–10.5)
nRBC: 0 % (ref 0.0–0.2)

## 2020-02-03 MED ORDER — POLYETHYLENE GLYCOL 3350 17 G PO PACK
17.0000 g | PACK | Freq: Every day | ORAL | Status: DC | PRN
  Administered 2020-02-04: 17 g via ORAL

## 2020-02-03 MED ORDER — SORBITOL 70 % SOLN
15.0000 mL | Freq: Every day | Status: DC | PRN
  Filled 2020-02-03: qty 30

## 2020-02-03 MED ORDER — BISACODYL 10 MG RE SUPP
10.0000 mg | Freq: Every day | RECTAL | Status: DC | PRN
  Administered 2020-02-04: 10 mg via RECTAL
  Filled 2020-02-03: qty 1

## 2020-02-03 NOTE — Plan of Care (Signed)
  Problem: Clinical Measurements: Goal: Ability to maintain clinical measurements within normal limits will improve Outcome: Progressing Goal: Postoperative complications will be avoided or minimized Outcome: Progressing   Problem: Skin Integrity: Goal: Demonstration of wound healing without infection will improve Outcome: Progressing   Problem: Health Behavior/Discharge Planning: Goal: Ability to manage health-related needs will improve Outcome: Progressing   Problem: Clinical Measurements: Goal: Ability to maintain clinical measurements within normal limits will improve Outcome: Progressing Goal: Will remain free from infection Outcome: Progressing Goal: Diagnostic test results will improve Outcome: Progressing Goal: Cardiovascular complication will be avoided Outcome: Progressing   Problem: Activity: Goal: Risk for activity intolerance will decrease Outcome: Progressing   Problem: Nutrition: Goal: Adequate nutrition will be maintained Outcome: Progressing   Problem: Coping: Goal: Level of anxiety will decrease Outcome: Progressing   Problem: Elimination: Goal: Will not experience complications related to bowel motility Outcome: Progressing   Problem: Pain Managment: Goal: General experience of comfort will improve Outcome: Progressing   Problem: Safety: Goal: Ability to remain free from injury will improve Outcome: Progressing   Problem: Skin Integrity: Goal: Risk for impaired skin integrity will decrease Outcome: Progressing   

## 2020-02-03 NOTE — Progress Notes (Signed)
     Subjective: 4 Days Post-Op Procedure(s) (LRB): REPEAT IRRIGATION AND DEBRIDEMENT LEFT KNEE PLACEMENT OF WOUND VAC (Left) Awake, alert and oriented x 4. Dressing intact VAC to suction. Dr Magnus Ivan discussed options concerning left TKR infection. She is scheduled for surgical treatment on Tues  02/05/2020. Lab tomorrow pre op.  Patient reports pain as moderate.   Nauseated with meds for pain.  Objective:   VITALS:  Temp:  [98.1 F (36.7 C)-99 F (37.2 C)] 98.4 F (36.9 C) (12/05 0556) Pulse Rate:  [56-65] 56 (12/05 0556) Resp:  [16-18] 16 (12/05 0556) BP: (98-108)/(65-72) 108/71 (12/05 0556) SpO2:  [99 %-100 %] 100 % (12/05 0556)  Neurologically intact ABD soft Neurovascular intact Sensation intact distally Intact pulses distally Dorsiflexion/Plantar flexion intact Incision: dressing C/D/I, no drainage and VAC to negative pressure minimal drainage at this point.  No cellulitis present Compartment soft   LABS Recent Labs    02/01/20 0343 02/01/20 0343 02/02/20 0406 02/03/20 0430  HGB 9.4*  --  9.2* 9.3*  WBC 14.3*   < > 12.0* 11.5*  PLT 250   < > 239 265   < > = values in this interval not displayed.   Recent Labs    02/01/20 0343  NA 132*  K 3.3*  CL 100  CO2 25  BUN 5*  CREATININE 0.63  GLUCOSE 94   No results for input(s): LABPT, INR in the last 72 hours.   Assessment/Plan: 4 Days Post-Op Procedure(s) (LRB): REPEAT IRRIGATION AND DEBRIDEMENT LEFT KNEE PLACEMENT OF WOUND VAC (Left)  Advance diet Up with therapy Continue ABX therapy due to Post-op infection  Pre op lab in AM 02/04/2020  [name] 02/03/2020, 10:06 AMPatient ID: Crystal Patterson, female   DOB: November 05, 1965, 54 y.o.   MRN: [id number]

## 2020-02-03 NOTE — Plan of Care (Signed)
°  Problem: Clinical Measurements: °Goal: Will remain free from infection °Outcome: Progressing °  °Problem: Activity: °Goal: Risk for activity intolerance will decrease °Outcome: Progressing °  °Problem: Nutrition: °Goal: Adequate nutrition will be maintained °Outcome: Progressing °  °

## 2020-02-04 LAB — CBC WITH DIFFERENTIAL/PLATELET
Abs Immature Granulocytes: 0.08 10*3/uL — ABNORMAL HIGH (ref 0.00–0.07)
Basophils Absolute: 0 10*3/uL (ref 0.0–0.1)
Basophils Relative: 0 %
Eosinophils Absolute: 0.1 10*3/uL (ref 0.0–0.5)
Eosinophils Relative: 1 %
HCT: 30.2 % — ABNORMAL LOW (ref 36.0–46.0)
Hemoglobin: 9.5 g/dL — ABNORMAL LOW (ref 12.0–15.0)
Immature Granulocytes: 1 %
Lymphocytes Relative: 15 %
Lymphs Abs: 1.7 10*3/uL (ref 0.7–4.0)
MCH: 30.3 pg (ref 26.0–34.0)
MCHC: 31.5 g/dL (ref 30.0–36.0)
MCV: 96.2 fL (ref 80.0–100.0)
Monocytes Absolute: 1.9 10*3/uL — ABNORMAL HIGH (ref 0.1–1.0)
Monocytes Relative: 16 %
Neutro Abs: 7.9 10*3/uL — ABNORMAL HIGH (ref 1.7–7.7)
Neutrophils Relative %: 67 %
Platelets: 286 10*3/uL (ref 150–400)
RBC: 3.14 MIL/uL — ABNORMAL LOW (ref 3.87–5.11)
RDW: 14.2 % (ref 11.5–15.5)
WBC: 11.7 10*3/uL — ABNORMAL HIGH (ref 4.0–10.5)
nRBC: 0 % (ref 0.0–0.2)

## 2020-02-04 LAB — C-REACTIVE PROTEIN: CRP: 8.4 mg/dL — ABNORMAL HIGH (ref <1.0)

## 2020-02-04 LAB — COMPREHENSIVE METABOLIC PANEL
ALT: 11 U/L (ref 0–44)
AST: 37 U/L (ref 15–41)
Albumin: 2.5 g/dL — ABNORMAL LOW (ref 3.5–5.0)
Alkaline Phosphatase: 90 U/L (ref 38–126)
Anion gap: 11 (ref 5–15)
BUN: <5 mg/dL — ABNORMAL LOW (ref 6–20)
CO2: 28 mmol/L (ref 22–32)
Calcium: 8.1 mg/dL — ABNORMAL LOW (ref 8.9–10.3)
Chloride: 98 mmol/L (ref 98–111)
Creatinine, Ser: 0.77 mg/dL (ref 0.44–1.00)
GFR, Estimated: >60 mL/min (ref >60)
Glucose, Bld: 103 mg/dL — ABNORMAL HIGH (ref 70–99)
Potassium: 2.7 mmol/L — CL (ref 3.5–5.1)
Sodium: 137 mmol/L (ref 135–145)
Total Bilirubin: 1.1 mg/dL (ref 0.3–1.2)
Total Protein: 6.8 g/dL (ref 6.5–8.1)

## 2020-02-04 LAB — SEDIMENTATION RATE: Sed Rate: 42 mm/hr — ABNORMAL HIGH (ref 0–22)

## 2020-02-04 MED ORDER — POTASSIUM CHLORIDE CRYS ER 20 MEQ PO TBCR
40.0000 meq | EXTENDED_RELEASE_TABLET | Freq: Two times a day (BID) | ORAL | Status: DC
  Administered 2020-02-04 – 2020-02-08 (×8): 40 meq via ORAL
  Filled 2020-02-04 (×8): qty 2

## 2020-02-04 NOTE — Progress Notes (Signed)
Patient ID: Crystal Patterson, female   DOB: February 27, 1966, 54 y.o.   MRN: [id number] There is still concern that the patient's white blood cell count still remains elevated at just over 11,000.  She is hypokalemic with a potassium of 2.7.  We will supplement her potassium today.  My plan is to return to the operating room late tomorrow (Tuesday) for repeat irrigation and debridement of her left knee.  By then we will have a better understanding of the next steps in treatment for wound care and the potential for retaining her components.

## 2020-02-04 NOTE — Plan of Care (Signed)
  Problem: Clinical Measurements: Goal: Ability to maintain clinical measurements within normal limits will improve Outcome: Progressing Goal: Postoperative complications will be avoided or minimized Outcome: Progressing   Problem: Skin Integrity: Goal: Demonstration of wound healing without infection will improve Outcome: Progressing   Problem: Health Behavior/Discharge Planning: Goal: Ability to manage health-related needs will improve Outcome: Progressing   Problem: Clinical Measurements: Goal: Ability to maintain clinical measurements within normal limits will improve Outcome: Progressing Goal: Will remain free from infection Outcome: Progressing Goal: Diagnostic test results will improve Outcome: Progressing Goal: Cardiovascular complication will be avoided Outcome: Progressing   Problem: Activity: Goal: Risk for activity intolerance will decrease Outcome: Progressing   Problem: Nutrition: Goal: Adequate nutrition will be maintained Outcome: Progressing   Problem: Coping: Goal: Level of anxiety will decrease Outcome: Progressing   Problem: Elimination: Goal: Will not experience complications related to bowel motility Outcome: Progressing   Problem: Pain Managment: Goal: General experience of comfort will improve Outcome: Progressing   Problem: Safety: Goal: Ability to remain free from injury will improve Outcome: Progressing   Problem: Skin Integrity: Goal: Risk for impaired skin integrity will decrease Outcome: Progressing

## 2020-02-04 NOTE — Care Management Important Message (Signed)
Important Message  Patient Details IM Letter given to the Patient. Name: Crystal Patterson MRN: [id number] Date of Birth: 06-14-1965   Medicare Important Message Given:  Yes     Caren Macadam 02/04/2020, 4:12 PM

## 2020-02-04 NOTE — Progress Notes (Signed)
CRITICAL VALUE ALERT  Critical Value:  Potassium 2.7  Date & Time Notied:  02/04/2020 at 0445  Provider Notified: text page sent through [facility] to on-call provider  Orders Received/Actions taken: awaiting any orders

## 2020-02-04 NOTE — Progress Notes (Signed)
Physical Therapy Treatment Patient Details Name: Crystal Patterson MRN: [id number] DOB: 1965/04/19 Today's Date: 02/04/2020    History of Present Illness Patient is 54 y.o. female s/p L TKR I&D with liner revision,  s/p Lt TKA on 01/04/20 with PMH significant for OA, HTN, Lupus. return to OR 12/2 for knee I &D, deb necrotic skin and  VAC placement. Plan is for return to OR again 12/7    PT Comments    Pt politely declined amb as she was just up to bathroom and back to bed. Agreeable to exercises/gentle ROM L knee. Pt with good participation. Pt aware she is going back to surgery tomorrow afternoon.     Further discussed d/c plan with pt, pt is planning  to go to her dtr's house. Pt reports her dtr can be with her as much as needed, pt will be on main level of home with bedroom/bathroom.   PT Goals updated, continue PT plan of care in acute setting   Follow Up Recommendations  Home health PT;Supervision for mobility/OOB     Equipment Recommendations  None recommended by PT    Recommendations for Other Services       Precautions / Restrictions Precautions Precautions: Knee;Fall Precaution Comments: gentle ROM "nothing aggressive" per Ortho order Required Braces or Orthoses: Knee Immobilizer - Left Knee Immobilizer - Left: On when out of bed or walking Restrictions Weight Bearing Restrictions: No LLE Weight Bearing: Weight bearing as tolerated Other Position/Activity Restrictions: WBAT    Mobility  Bed Mobility               General bed mobility comments: declines d/t just having gotten back to bed   Transfers                    Ambulation/Gait                 Stairs             Wheelchair Mobility    Modified Rankin (Stroke Patients Only)       Balance                                            Cognition Arousal/Alertness: Awake/alert Behavior During Therapy: WFL for tasks assessed/performed Overall Cognitive  Status: Within Functional Limits for tasks assessed                                        Exercises Total Joint Exercises Ankle Circles/Pumps: AROM;Both;10 reps Quad Sets: AROM;Both;10 reps Short Arc QuadBarbaraann Boys;Left;10 reps Heel Slides: AAROM;Left;10 reps Hip ABduction/ADduction: AAROM;Left;10 reps;Supine Straight Leg Raises: AAROM;Left;10 reps;Supine Goniometric ROM: gentle AAROM, ~ 5 to 45  degrees knee flexion    General Comments        Pertinent Vitals/Pain Pain Assessment: Faces Faces Pain Scale: Hurts even more Pain Location: L knee with activity Pain Descriptors / Indicators: Grimacing;Guarding;Sore;Spasm Pain Intervention(s): Limited activity within patient's tolerance;Monitored during session;Premedicated before session;Repositioned    Home Living                      Prior Function            PT Goals (current goals can now be found in the care plan section) Acute Rehab PT  Goals Patient Stated Goal: be able to walk with less pain PT Goal Formulation: With patient Time For Goal Achievement: 02/11/20 Potential to Achieve Goals: Good Progress towards PT goals: Progressing toward goals    Frequency    7X/week      PT Plan Current plan remains appropriate    Co-evaluation              AM-PAC PT "6 Clicks" Mobility   Outcome Measure  Help needed turning from your back to your side while in a flat bed without using bedrails?: A Little Help needed moving from lying on your back to sitting on the side of a flat bed without using bedrails?: A Little Help needed moving to and from a bed to a chair (including a wheelchair)?: A Little Help needed standing up from a chair using your arms (e.g., wheelchair or bedside chair)?: A Little Help needed to walk in hospital room?: A Lot Help needed climbing 3-5 steps with a railing? : A Lot 6 Click Score: 16    End of Session Equipment Utilized During Treatment: Gait belt Activity  Tolerance: Patient limited by pain;Patient limited by fatigue Patient left: in bed;with call bell/phone within reach;with bed alarm set   PT Visit Diagnosis: Muscle weakness (generalized) (M62.81);Difficulty in walking, not elsewhere classified (R26.2)     Time: 8101-7510 PT Time Calculation (min) (ACUTE ONLY): 15 min  Charges:  $Therapeutic Exercise: 8-22 mins                     Delice Bison, PT  Acute Rehab Dept [facility]) [phone] Pager [phone]  02/04/2020    [facility] 02/04/2020, 11:42 AM

## 2020-02-05 ENCOUNTER — Inpatient Hospital Stay (HOSPITAL_COMMUNITY): Payer: Medicare Other | Admitting: Anesthesiology

## 2020-02-05 ENCOUNTER — Encounter (HOSPITAL_COMMUNITY)
Admission: RE | Disposition: A | Discharge status: Home Health | Payer: Self-pay | Source: Home / Self Care | Attending: Orthopaedic Surgery

## 2020-02-05 DIAGNOSIS — T8454XD Infection and inflammatory reaction due to internal left knee prosthesis, subsequent encounter: Secondary | ICD-10-CM | POA: Diagnosis not present

## 2020-02-05 HISTORY — PX: EXCISIONAL TOTAL KNEE ARTHROPLASTY: SHX5015

## 2020-02-05 LAB — CBC
HCT: 31.9 % — ABNORMAL LOW (ref 36.0–46.0)
Hemoglobin: 10 g/dL — ABNORMAL LOW (ref 12.0–15.0)
MCH: 30 pg (ref 26.0–34.0)
MCHC: 31.3 g/dL (ref 30.0–36.0)
MCV: 95.8 fL (ref 80.0–100.0)
Platelets: 285 10*3/uL (ref 150–400)
RBC: 3.33 MIL/uL — ABNORMAL LOW (ref 3.87–5.11)
RDW: 14.4 % (ref 11.5–15.5)
WBC: 13.3 10*3/uL — ABNORMAL HIGH (ref 4.0–10.5)
nRBC: 0 % (ref 0.0–0.2)

## 2020-02-05 LAB — BASIC METABOLIC PANEL
Anion gap: 9 (ref 5–15)
BUN: 5 mg/dL — ABNORMAL LOW (ref 6–20)
CO2: 28 mmol/L (ref 22–32)
Calcium: 8.6 mg/dL — ABNORMAL LOW (ref 8.9–10.3)
Chloride: 99 mmol/L (ref 98–111)
Creatinine, Ser: 0.6 mg/dL (ref 0.44–1.00)
GFR, Estimated: >60 mL/min (ref >60)
Glucose, Bld: 96 mg/dL (ref 70–99)
Potassium: 3.7 mmol/L (ref 3.5–5.1)
Sodium: 136 mmol/L (ref 135–145)

## 2020-02-05 SURGERY — EXCISIONAL TOTAL KNEE ARTHROPLASTY
Anesthesia: General | Site: Knee | Laterality: Left

## 2020-02-05 MED ORDER — FENTANYL CITRATE (PF) 250 MCG/5ML IJ SOLN
INTRAMUSCULAR | Status: DC | PRN
  Administered 2020-02-05 (×2): 100 ug via INTRAVENOUS

## 2020-02-05 MED ORDER — FENTANYL CITRATE (PF) 100 MCG/2ML IJ SOLN
25.0000 ug | INTRAMUSCULAR | Status: DC | PRN
  Administered 2020-02-05: 25 ug via INTRAVENOUS

## 2020-02-05 MED ORDER — EPHEDRINE 5 MG/ML INJ
INTRAVENOUS | Status: AC
  Filled 2020-02-05: qty 20

## 2020-02-05 MED ORDER — FENTANYL CITRATE (PF) 100 MCG/2ML IJ SOLN: 50.0000 ug | INTRAMUSCULAR | Status: DC | PRN

## 2020-02-05 MED ORDER — DEXAMETHASONE SODIUM PHOSPHATE 10 MG/ML IJ SOLN
INTRAMUSCULAR | Status: DC | PRN
  Administered 2020-02-05: 10 mg

## 2020-02-05 MED ORDER — ROPIVACAINE HCL 7.5 MG/ML IJ SOLN
INTRAMUSCULAR | Status: DC | PRN
  Administered 2020-02-05: 20 mL via PERINEURAL

## 2020-02-05 MED ORDER — ALBUMIN HUMAN 5 % IV SOLN
INTRAVENOUS | Status: AC
  Filled 2020-02-05: qty 250

## 2020-02-05 MED ORDER — FENTANYL CITRATE (PF) 250 MCG/5ML IJ SOLN
INTRAMUSCULAR | Status: AC
  Filled 2020-02-05: qty 5

## 2020-02-05 MED ORDER — CHLORHEXIDINE GLUCONATE 0.12 % MT SOLN
15.0000 mL | Freq: Once | OROMUCOSAL | Status: AC
  Administered 2020-02-05: 15 mL via OROMUCOSAL

## 2020-02-05 MED ORDER — ROCURONIUM BROMIDE 10 MG/ML (PF) SYRINGE
PREFILLED_SYRINGE | INTRAVENOUS | Status: AC
  Filled 2020-02-05: qty 10

## 2020-02-05 MED ORDER — FENTANYL CITRATE (PF) 100 MCG/2ML IJ SOLN
INTRAMUSCULAR | Status: AC
  Filled 2020-02-05: qty 2

## 2020-02-05 MED ORDER — DEXAMETHASONE SODIUM PHOSPHATE 10 MG/ML IJ SOLN
INTRAMUSCULAR | Status: DC | PRN
  Administered 2020-02-05: 4 mg via INTRAVENOUS

## 2020-02-05 MED ORDER — PROMETHAZINE HCL 25 MG/ML IJ SOLN: 6.2500 mg | INTRAMUSCULAR | Status: DC | PRN

## 2020-02-05 MED ORDER — MIDAZOLAM HCL 5 MG/5ML IJ SOLN
INTRAMUSCULAR | Status: DC | PRN
  Administered 2020-02-05: 2 mg via INTRAVENOUS

## 2020-02-05 MED ORDER — SODIUM CHLORIDE 0.9 % IR SOLN
Status: DC | PRN
  Administered 2020-02-05: 3000 mL

## 2020-02-05 MED ORDER — PROPOFOL 10 MG/ML IV BOLUS
INTRAVENOUS | Status: DC | PRN
  Administered 2020-02-05: 50 mg via INTRAVENOUS
  Administered 2020-02-05: 40 mg via INTRAVENOUS

## 2020-02-05 MED ORDER — LIDOCAINE 2% (20 MG/ML) 5 ML SYRINGE
INTRAMUSCULAR | Status: DC | PRN
  Administered 2020-02-05: 60 mg via INTRAVENOUS

## 2020-02-05 MED ORDER — LACTATED RINGERS IV SOLN: INTRAVENOUS | Status: DC

## 2020-02-05 MED ORDER — PHENYLEPHRINE 40 MCG/ML (10ML) SYRINGE FOR IV PUSH (FOR BLOOD PRESSURE SUPPORT)
PREFILLED_SYRINGE | INTRAVENOUS | Status: AC
  Filled 2020-02-05: qty 30

## 2020-02-05 MED ORDER — FENTANYL CITRATE (PF) 100 MCG/2ML IJ SOLN
INTRAMUSCULAR | Status: AC
  Administered 2020-02-05: 50 ug via INTRAVENOUS
  Filled 2020-02-05: qty 2

## 2020-02-05 MED ORDER — SUCCINYLCHOLINE CHLORIDE 200 MG/10ML IV SOSY
PREFILLED_SYRINGE | INTRAVENOUS | Status: DC | PRN
  Administered 2020-02-05: 100 mg via INTRAVENOUS

## 2020-02-05 MED ORDER — ONDANSETRON HCL 4 MG/2ML IJ SOLN
INTRAMUSCULAR | Status: DC | PRN
  Administered 2020-02-05: 4 mg via INTRAVENOUS

## 2020-02-05 MED ORDER — MIDAZOLAM HCL 2 MG/2ML IJ SOLN
INTRAMUSCULAR | Status: AC
  Filled 2020-02-05: qty 2

## 2020-02-05 SURGICAL SUPPLY — 54 items
APL SKNCLS STERI-STRIP NONHPOA (GAUZE/BANDAGES/DRESSINGS)
BAG SPEC THK2 15X12 ZIP CLS (MISCELLANEOUS)
BAG ZIPLOCK 12X15 (MISCELLANEOUS) IMPLANT
BENZOIN TINCTURE PRP APPL 2/3 (GAUZE/BANDAGES/DRESSINGS) IMPLANT
BLADE SAG 18X100X1.27 (BLADE) IMPLANT
BLADE SURG SZ10 CARB STEEL (BLADE) ×6 IMPLANT
BNDG ELASTIC 6X5.8 VLCR STR LF (GAUZE/BANDAGES/DRESSINGS) ×3 IMPLANT
BOWL SMART MIX CTS (DISPOSABLE) IMPLANT
CLOSURE WOUND 1/2 X4 (GAUZE/BANDAGES/DRESSINGS)
COVER SURGICAL LIGHT HANDLE (MISCELLANEOUS) ×3 IMPLANT
COVER WAND RF STERILE (DRAPES) IMPLANT
CUFF TOURN SGL QUICK 34 (TOURNIQUET CUFF) ×3
CUFF TRNQT CYL 34X4.125X (TOURNIQUET CUFF) ×1 IMPLANT
DECANTER SPIKE VIAL GLASS SM (MISCELLANEOUS) IMPLANT
DRAPE U-SHAPE 47X51 STRL (DRAPES) ×3 IMPLANT
DRESSING VERAFLO CLEANSE CC (GAUZE/BANDAGES/DRESSINGS) IMPLANT
DRSG PAD ABDOMINAL 8X10 ST (GAUZE/BANDAGES/DRESSINGS) ×6 IMPLANT
DRSG VERAFLO CLEANSE CC (GAUZE/BANDAGES/DRESSINGS) ×3
DURAPREP 26ML APPLICATOR (WOUND CARE) ×3 IMPLANT
ELECT BLADE TIP CTD 4 INCH (ELECTRODE) ×3 IMPLANT
ELECT REM PT RETURN 15FT ADLT (MISCELLANEOUS) ×3 IMPLANT
GAUZE SPONGE 4X4 12PLY STRL (GAUZE/BANDAGES/DRESSINGS) ×3 IMPLANT
GAUZE XEROFORM 1X8 LF (GAUZE/BANDAGES/DRESSINGS) IMPLANT
GLOVE BIO SURGEON STRL SZ7.5 (GLOVE) ×3 IMPLANT
GLOVE BIOGEL PI IND STRL 8 (GLOVE) ×2 IMPLANT
GLOVE BIOGEL PI INDICATOR 8 (GLOVE) ×4
GLOVE ECLIPSE 8.0 STRL XLNG CF (GLOVE) ×3 IMPLANT
GOWN STRL REUS W/TWL XL LVL3 (GOWN DISPOSABLE) ×6 IMPLANT
HANDPIECE INTERPULSE COAX TIP (DISPOSABLE) ×3
HOLDER FOLEY CATH W/STRAP (MISCELLANEOUS) IMPLANT
IMMOBILIZER KNEE 20 (SOFTGOODS)
IMMOBILIZER KNEE 20 THIGH 36 (SOFTGOODS) ×1 IMPLANT
KIT TURNOVER KIT A (KITS) IMPLANT
NS IRRIG 1000ML POUR BTL (IV SOLUTION) ×3 IMPLANT
PACK TOTAL KNEE CUSTOM (KITS) ×3 IMPLANT
PADDING CAST COTTON 6X4 STRL (CAST SUPPLIES) ×6 IMPLANT
PENCIL SMOKE EVACUATOR (MISCELLANEOUS) IMPLANT
PROTECTOR NERVE ULNAR (MISCELLANEOUS) ×3 IMPLANT
SET HNDPC FAN SPRY TIP SCT (DISPOSABLE) ×1 IMPLANT
SET PAD KNEE POSITIONER (MISCELLANEOUS) ×3 IMPLANT
STAPLER VISISTAT 35W (STAPLE) IMPLANT
STRIP CLOSURE SKIN 1/2X4 (GAUZE/BANDAGES/DRESSINGS) IMPLANT
SUT ETHILON 2 0 PSLX (SUTURE) ×4 IMPLANT
SUT MNCRL AB 4-0 PS2 18 (SUTURE) IMPLANT
SUT PDS AB 1 CTX 36 (SUTURE) ×2 IMPLANT
SUT VIC AB 0 CT1 27 (SUTURE)
SUT VIC AB 0 CT1 27XBRD ANTBC (SUTURE) ×1 IMPLANT
SUT VIC AB 1 CT1 36 (SUTURE) ×2 IMPLANT
SUT VIC AB 2-0 CT1 27 (SUTURE) ×3
SUT VIC AB 2-0 CT1 TAPERPNT 27 (SUTURE) ×2 IMPLANT
TRAY FOLEY MTR SLVR 16FR STAT (SET/KITS/TRAYS/PACK) ×3 IMPLANT
TRAY PREP A LATEX SAFE STRL (SET/KITS/TRAYS/PACK) ×2 IMPLANT
WATER STERILE IRR 1000ML POUR (IV SOLUTION) ×3 IMPLANT
YANKAUER SUCT BULB TIP NO VENT (SUCTIONS) ×2 IMPLANT

## 2020-02-05 NOTE — Transfer of Care (Signed)
Immediate Anesthesia Transfer of Care Note  Patient: Crystal Patterson  Procedure(s) Performed: Procedure(s): REPEAT IRRIGATION AND DEBRIDEMENT LEFT KNEE (Left)  Patient Location: PACU  Anesthesia Type:General  Level of Consciousness: Alert, Awake, Oriented  Airway & Oxygen Therapy: Patient Spontanous Breathing  Post-op Assessment: Report given to RN  Post vital signs: Reviewed and stable  Last Vitals:  Vitals:   02/05/20 1646 02/05/20 1704  BP: 110/68 102/78  Pulse: 66 63  Resp: 20 20  Temp: 36.9 C   SpO2: 100% 100%    Complications: No apparent anesthesia complications

## 2020-02-05 NOTE — Anesthesia Procedure Notes (Signed)
Anesthesia Regional Block: Adductor canal block   Pre-Anesthetic Checklist: ,, timeout performed, Correct Patient, Correct Site, Correct Laterality, Correct Procedure, Correct Position, site marked, Risks and benefits discussed,  Surgical consent,  Pre-op evaluation,  At surgeon's request and post-op pain management  Laterality: Left  Prep: chloraprep       Needles:  Injection technique: Single-shot  Needle Type: Echogenic Needle     Needle Length: 9cm  Needle Gauge: 21     Additional Needles:   Procedures:,,,, ultrasound used (permanent image in chart),,,,  Narrative:  Start time: 02/05/2020 4:57 PM End time: 02/05/2020 5:02 PM Injection made incrementally with aspirations every 5 mL.  Performed by: Personally  Anesthesiologist: [name], MD  Additional Notes: No pain on injection. No increased resistance to injection. Injection made in 5cc increments.  Good needle visualization.  Patient tolerated procedure well.

## 2020-02-05 NOTE — Anesthesia Procedure Notes (Signed)
Procedure Name: Intubation Date/Time: 02/05/2020 5:39 PM Performed by: Elyn Peers, CRNA Pre-anesthesia Checklist: Patient identified, Emergency Drugs available, Suction available, Patient being monitored and Timeout performed Patient Re-evaluated:Patient Re-evaluated prior to induction Oxygen Delivery Method: Circle system utilized Preoxygenation: Pre-oxygenation with 100% oxygen Induction Type: IV induction and Rapid sequence Laryngoscope Size: Miller and 3 Grade View: Grade I Tube type: Oral Tube size: 7.0 mm Number of attempts: 1 Airway Equipment and Method: Stylet Placement Confirmation: ETT inserted through vocal cords under direct vision,  positive ETCO2 and breath sounds checked- equal and bilateral Secured at: 23 cm Tube secured with: Tape Dental Injury: Teeth and Oropharynx as per pre-operative assessment

## 2020-02-05 NOTE — Brief Op Note (Signed)
02/05/2020  6:38 PM  PATIENT:  [name]  54 y.o. female  PRE-OPERATIVE DIAGNOSIS:  infected left total knee  POST-OPERATIVE DIAGNOSIS:  infected left total knee  PROCEDURE:  Procedure(s): REPEAT IRRIGATION AND DEBRIDEMENT LEFT KNEE (Left)  SURGEON:  Surgeon(s) and Role:    [name], MD - Primary  PHYSICIAN ASSISTANT:  Rexene Edison, PA-C  ANESTHESIA:   regional and general  EBL:  25 mL   COUNTS:  YES  TOURNIQUET:  * No tourniquets in log *  DICTATION: .Other Dictation: Dictation Number [id number]  PLAN OF CARE: Admit to inpatient   PATIENT DISPOSITION:  PACU - hemodynamically stable.   Delay start of Pharmacological VTE agent (>24hrs) due to surgical blood loss or risk of bleeding: no

## 2020-02-05 NOTE — Anesthesia Postprocedure Evaluation (Signed)
Anesthesia Post Note  Patient: Crystal Patterson  Procedure(s) Performed: REPEAT IRRIGATION AND DEBRIDEMENT LEFT KNEE (Left Knee)     Patient location during evaluation: PACU Anesthesia Type: General Level of consciousness: awake and alert Pain management: pain level controlled Vital Signs Assessment: post-procedure vital signs reviewed and stable Respiratory status: spontaneous breathing, nonlabored ventilation and respiratory function stable Cardiovascular status: blood pressure returned to baseline and stable Postop Assessment: no apparent nausea or vomiting Anesthetic complications: no   No complications documented.  Last Vitals:  Vitals:   02/05/20 2138 02/05/20 2228  BP: 113/77 109/72  Pulse: 71 69  Resp: 16 16  Temp: 36.6 C 36.9 C  SpO2: 100% 100%    Last Pain:  Vitals:   02/05/20 2228  TempSrc: Oral  PainSc:                  [name]

## 2020-02-05 NOTE — Progress Notes (Signed)
AssistedDr. Turk with left, ultrasound guided, adductor canal block. Side rails up, monitors on throughout procedure. See vital signs in flow sheet. Tolerated Procedure well.  

## 2020-02-05 NOTE — Progress Notes (Signed)
Informed surgical consent signed. Pt denied any further questions or concerns.

## 2020-02-05 NOTE — Progress Notes (Signed)
Patient ID: Crystal Patterson, female   DOB: 1965/11/20, 54 y.o.   MRN: [id number] The patient fully understands that we are proceeding to surgery once again today to wash out her infected left total knee in an effort to maintain the components.  There was been no interval acute change in her medical status.

## 2020-02-05 NOTE — Anesthesia Preprocedure Evaluation (Signed)
Anesthesia Evaluation  Patient identified by MRN, date of birth, ID band Patient awake    Reviewed: Allergy & Precautions, H&P , NPO status , Patient's Chart, lab work & pertinent test results, reviewed documented beta blocker date and time   Airway Mallampati: I  TM Distance: >3 FB Neck ROM: Full    Dental  (+) Teeth Intact   Pulmonary Current Smoker and Patient abstained from smoking.,    Pulmonary exam normal        Cardiovascular hypertension, Pt. on medications and Pt. on home beta blockers + angina + CAD and + Peripheral Vascular Disease   Rhythm:Regular Rate:Normal     Neuro/Psych  Headaches, PSYCHIATRIC DISORDERS Depression negative neurological ROS     GI/Hepatic Neg liver ROS, GERD  Medicated,  Endo/Other  negative endocrine ROS  Renal/GU negative Renal ROS  negative genitourinary   Musculoskeletal  (+) Arthritis , Left knee infection s/p TKR 11/21   Abdominal (+)  Abdomen: soft. Bowel sounds: normal.  Peds negative pediatric ROS (+)  Hematology negative hematology ROS (+)   Anesthesia Other Findings   Reproductive/Obstetrics negative OB ROS                             Anesthesia Physical  Anesthesia Plan  ASA: III  Anesthesia Plan: General   Post-op Pain Management:  Regional for Post-op pain   Induction: Intravenous and Cricoid pressure planned  PONV Risk Score and Plan: 2 and Ondansetron, Dexamethasone and Treatment may vary due to age or medical condition  Airway Management Planned: Oral ETT  Additional Equipment: None  Intra-op Plan:   Post-operative Plan:   Informed Consent: I have reviewed the patients History and Physical, chart, labs and discussed the procedure including the risks, benefits and alternatives for the proposed anesthesia with the patient or authorized representative who has indicated his/her understanding and acceptance.       Plan  Discussed with: CRNA and Anesthesiologist  Anesthesia Plan Comments:         Anesthesia Quick Evaluation

## 2020-02-05 NOTE — Progress Notes (Signed)
Physical Therapy Treatment Patient Details Name: Crystal Patterson MRN: [id number] DOB: 14-May-1965 Today's Date: 02/05/2020    History of Present Illness Patient is 54 y.o. female s/p L TKR I&D with liner revision,  s/p Lt TKA on 01/04/20 with PMH significant for OA, HTN, Lupus. return to OR 12/2 for knee I &D, deb necrotic skin and  VAC placement. Plan is for return to OR again 12/7    PT Comments    Pt agreeable to OOB today. amb ~ 73' with RW and min/guard assist, demonstrates improved wt shift to LLE today and overall requiring decr assist. Less pain with WBing, although experiencing muscle spasms after mobility despite meds, spasms decr with repositioning.     Follow Up Recommendations  Home health PT;Supervision for mobility/OOB     Equipment Recommendations  None recommended by PT    Recommendations for Other Services       Precautions / Restrictions Precautions Precautions: Knee;Fall Precaution Comments: gentle ROM "nothing aggressive" per Ortho order; VAC L knee Required Braces or Orthoses: Knee Immobilizer - Left Knee Immobilizer - Left: On when out of bed or walking Restrictions Weight Bearing Restrictions: No Other Position/Activity Restrictions: WBAT    Mobility  Bed Mobility Overal bed mobility: Needs Assistance Bed Mobility: Supine to Sit     Supine to sit: Min assist     General bed mobility comments: incr time and intermittent assist with LLE, pt self assists LLE with UEs as able   Transfers Overall transfer level: Needs assistance Equipment used: Rolling walker (2 wheeled) Transfers: Sit to/from Stand Sit to Stand: Min assist         General transfer comment: incr time, cues for hand placement and to use RLE to power up   Ambulation/Gait Ambulation/Gait assistance: Min assist;Min guard Gait Distance (Feet): 12 Feet Assistive device: Rolling walker (2 wheeled) Gait Pattern/deviations: Step-to pattern;Decreased step length - right;Decreased step  length - left;Decreased weight shift to left;Antalgic     General Gait Details: amb in room distance,  cues for sequence, improved wt shift to LLE    Stairs             Wheelchair Mobility    Modified Rankin (Stroke Patients Only)       Balance                                            Cognition Arousal/Alertness: Awake/alert Behavior During Therapy: WFL for tasks assessed/performed Overall Cognitive Status: Within Functional Limits for tasks assessed                                        Exercises Total Joint Exercises Ankle Circles/Pumps: AROM;Both;10 reps Quad Sets:  (deferred d/t incr pain )    General Comments        Pertinent Vitals/Pain Pain Assessment: Faces Faces Pain Scale: Hurts even more Pain Location: L knee with activity, repositioning Pain Descriptors / Indicators: Grimacing;Guarding;Sore;Spasm Pain Intervention(s): Limited activity within patient's tolerance;Monitored during session;Premedicated before session;Repositioned    Home Living                      Prior Function            PT Goals (current goals can now be found in the  care plan section) Acute Rehab PT Goals Patient Stated Goal: be able to walk with less pain PT Goal Formulation: With patient Time For Goal Achievement: 02/16/20 Potential to Achieve Goals: Good Progress towards PT goals: Progressing toward goals    Frequency    7X/week      PT Plan Current plan remains appropriate    Co-evaluation              AM-PAC PT "6 Clicks" Mobility   Outcome Measure  Help needed turning from your back to your side while in a flat bed without using bedrails?: A Little Help needed moving from lying on your back to sitting on the side of a flat bed without using bedrails?: A Little Help needed moving to and from a bed to a chair (including a wheelchair)?: A Little Help needed standing up from a chair using your arms (e.g.,  wheelchair or bedside chair)?: A Little Help needed to walk in hospital room?: A Little Help needed climbing 3-5 steps with a railing? : A Lot 6 Click Score: 17    End of Session Equipment Utilized During Treatment: Gait belt;Left knee immobilizer Activity Tolerance: Patient tolerated treatment well;Patient limited by pain Patient left: in chair;with call bell/phone within reach;with chair alarm set Nurse Communication: Mobility status PT Visit Diagnosis: Muscle weakness (generalized) (M62.81);Difficulty in walking, not elsewhere classified (R26.2)     Time: 6195-0932 PT Time Calculation (min) (ACUTE ONLY): 28 min  Charges:  $Gait Training: 23-37 mins                     Delice Bison, PT  Acute Rehab Dept ([facility]) [phone] Pager [phone]  02/05/2020    [facility] 02/05/2020, 4:16 PM

## 2020-02-05 NOTE — Progress Notes (Signed)
Rexene Edison, PA paged regarding the pt experiencing hematochezia.

## 2020-02-06 ENCOUNTER — Encounter (HOSPITAL_COMMUNITY): Payer: Self-pay | Admitting: Orthopaedic Surgery

## 2020-02-06 LAB — CBC
HCT: 31.8 % — ABNORMAL LOW (ref 36.0–46.0)
Hemoglobin: 10 g/dL — ABNORMAL LOW (ref 12.0–15.0)
MCH: 30.3 pg (ref 26.0–34.0)
MCHC: 31.4 g/dL (ref 30.0–36.0)
MCV: 96.4 fL (ref 80.0–100.0)
Platelets: 277 10*3/uL (ref 150–400)
RBC: 3.3 MIL/uL — ABNORMAL LOW (ref 3.87–5.11)
RDW: 14.5 % (ref 11.5–15.5)
WBC: 9.5 10*3/uL (ref 4.0–10.5)
nRBC: 0 % (ref 0.0–0.2)

## 2020-02-06 NOTE — Progress Notes (Signed)
Patient ID: Crystal Patterson, female   DOB: [birth year], 54 y.o.   MRN: [id number] The patient's left knee wound looks significantly worse at the time of surgery last evening.  I was able to ellipse out more necrotic tissue and thoroughly irrigate the knee.  I then was able to place a cleanse choice wound VAC over the open area.  I did take photographs of the wound and have consulted my partner Dr. Lajoyce Corners to evaluate the patient in light of the significant wound.  He does have expertise in dealing with significant wounds such as this.  We will continue the [facility] for now.  We do need to have her transferred over to [facility] see Dr. Lajoyce Corners can proceed with an operative intervention on her knee this coming Friday.  Fortunately, her white blood cell count this morning is now normal which is a good sign.  We will continue IV antibiotics as well.  I have communicated this with the patient in detail.

## 2020-02-06 NOTE — Op Note (Signed)
NAME: Crystal, Patterson MEDICAL RECORD BP:[id number] ACCOUNT [id number] DATE OF BIRTH:April 25, 1965 FACILITY: [facility] LOCATION: [facility] PHYSICIAN:Raziyah Vanvleck Aretha Parrot, MD  OPERATIVE REPORT  DATE OF PROCEDURE:  02/05/2020  PREOPERATIVE DIAGNOSIS:   1.  Infected left primary total knee replacement status post multiple irrigation and debridements with attempts to retain the hardware. 2.  Large open wound over the patellar tendon.  POSTOPERATIVE DIAGNOSES:   1.  Infected left primary total knee replacement status post multiple irrigation and debridements with attempts to retain the hardware. 2.  Large open wound over the patellar tendon.  PROCEDURE: 1.  Repeat irrigation and debridement, left knee. 2.  Placement of Cleanse Choice VAC sponge over her left knee wound.  SURGEON:  Vanita Panda. Magnus Ivan, MD  ASSISTANT:  Richardean Canal, PA-C.  ANESTHESIA: 1.  Left lower extremity regional block. 2.  General.  ESTIMATED BLOOD LOSS:  Less than 100 mL.  COMPLICATIONS:  None.  INDICATIONS:  The patient is a 54 year old female with lupus who underwent a primary left total knee arthroplasty on 01/04/2020.  She had terrible arthritis and both her knees and has had windswept knees.  There was complete loss of joint space.  There  was a significant deformity and her pain is daily and miserable.  She is walking with walking sticks and at this point, we elected to proceed with knee replacement surgery on just the left knee.  Her preoperative swabs for MRSA and staff were negative  and she is not a diabetic.  Her lupus had been under good control.  We took her to the operating room and performed the left total knee arthroplasty uneventfully.  She then followed up in the office and developed some drainage from her knee wound.  I  took her to the operating room 01/25/2020 for an irrigation and debridement of her knee and a poly liner exchange.  Her infection did grow out Proteus and Escherichia coli.   Infectious disease service was consulted and she has been on antibiotics every 8  hours that addressed the infection.  I took her back to the operating room last week for repeat irrigation, debridement and found necrotic tissue at her distal incision over a wide area.  I had to excise this and I have had a VAC on this since then  trying to promote some healing.  Her white blood cell count does still say some elevated and we put her on antibiotics as well.  She is returning to the operating room today for repeat irrigation and debridement and attempts to salvage the knee.  The  risks and benefits of surgery have been explained in detail to her and her family and informed consent was obtained.  DESCRIPTION OF PROCEDURE:  After informed consent was obtained and appropriate left knee was marked.  Anesthesia obtained a regional block in the holding room.  She was brought to the operating room and placed supine on the operating table.  General  anesthesia was then obtained, leg was prepped and draped with Betadine scrub and paint.  Sterile stockinette was utilized as well.  We did remove the VAC before this and found wound distally had worsened with some more necrotic margins.  We then used a  #10 blade to sharply excise necrotic skin from around the distal aspect of her incision and now there is a much wider wound that is open.  I did open our arthrotomy distally and did find some retained fluid.  There was no gross purulence and no malodor.  We thoroughly irrigated out the joint again with normal saline solution using pulsatile lavage and all the remaining soft tissue to get a good clean base.  We then closed the arthrotomy with interrupted #1 PDS suture.  There was some of the skin  incision that I could still reapproximate with nylon suture.  We then placed our Cleanse Choice VAC sponge over the open wound suction and set this to suction and got a good seal off of this.  A well-padded sterile dressing was  applied.  The patient was  awakened, extubated, and taken to recovery room in stable condition with all final counts being correct.  There were no complications noted.  Postoperatively, she will be returned to regular floor bed.  I have consulted one of my partners Dr. Aldean Baker  to assist in further wound care on this patient.  We may end up even transferring her over to [facility].  HN/NUANCE  D:02/05/2020 T:02/06/2020 JOB:[id number]

## 2020-02-06 NOTE — Progress Notes (Signed)
Physical Therapy Treatment Patient Details Name: Crystal Patterson MRN: [id number] DOB: Oct 21, 1965 Today's Date: 02/06/2020    History of Present Illness Patient is 54 y.o. female s/p L TKR I&D with liner revision,  s/p Lt TKA on 01/04/20 with PMH significant for OA, HTN, Lupus. return to OR 12/2 for knee I &D, deb necrotic skin and  VAC placement. repeat I&D 12/7, with open wound over patella tendon, VAC placed. Pt to transfer to [facility] for procedure-Dr. Lajoyce Corners on 12/10    PT Comments    Pt feeling much better today. She reports her pain is improved and that she has an appetite.  Requiring decr assist with all aspects of mobility,  able to amb into hallway today, incr tolerance to activity and able to incr WBing LLE during gait. Continue PT POC.   Follow Up Recommendations  Home health PT;Supervision for mobility/OOB     Equipment Recommendations  None recommended by PT    Recommendations for Other Services       Precautions / Restrictions Precautions Precautions: Knee;Fall Precaution Comments: gentle/"light ROM",  "nothing aggressive" per Ortho order; VAC L knee Required Braces or Orthoses: Knee Immobilizer - Left Knee Immobilizer - Left: On when out of bed or walking Restrictions Weight Bearing Restrictions: No LLE Weight Bearing: Weight bearing as tolerated Other Position/Activity Restrictions: WBAT    Mobility  Bed Mobility Overal bed mobility: Needs Assistance Bed Mobility: Supine to Sit     Supine to sit: Supervision     General bed mobility comments: incr time, pt able to self assist LLE with UEs, no physical assist. supervision for safety   Transfers Overall transfer level: Needs assistance Equipment used: Rolling walker (2 wheeled) Transfers: Sit to/from Stand Sit to Stand: Min guard;Supervision         General transfer comment: incr time, cues for hand placement and to use RLE to power up. min/guard to supervision for safety  Ambulation/Gait Ambulation/Gait  assistance: Min guard Gait Distance (Feet): 20 Feet Assistive device: Rolling walker (2 wheeled) Gait Pattern/deviations: Step-to pattern;Decreased stance time - left;Decreased step length - right;Decreased step length - left Gait velocity: decreased   General Gait Details: cues for initial sequence, min/guard for safety. slightly unsteady however no overt LOB,  fatigued after above distance needing seated rest   Stairs             Wheelchair Mobility    Modified Rankin (Stroke Patients Only)       Balance     Sitting balance-Leahy Scale: Fair Sitting balance - Comments: able to wt shift laterally R and L   to loosen gown, able to lean fwd and reposition LLE  with UEs, close supervision for safety    Standing balance support: Bilateral upper extremity supported;During functional activity   Standing balance comment: reliant on UEs for                             Cognition Arousal/Alertness: Awake/alert Behavior During Therapy: [facility] for tasks assessed/performed Overall Cognitive Status: Within Functional Limits for tasks assessed                                        Exercises Total Joint Exercises Ankle Circles/Pumps: AROM;Both;5 reps    General Comments        Pertinent Vitals/Pain Pain Assessment: 0-10 Faces Pain Scale: Hurts  a little bit Pain Location: L knee with movement Pain Descriptors / Indicators: Discomfort;Grimacing;Sore Pain Intervention(s): Limited activity within patient's tolerance;Monitored during session;Repositioned;Premedicated before session    Home Living                      Prior Function            PT Goals (current goals can now be found in the care plan section) Acute Rehab PT Goals Patient Stated Goal: be able to walk with less pain PT Goal Formulation: With patient Time For Goal Achievement: 02/16/20 Potential to Achieve Goals: Good Progress towards PT goals: Progressing toward  goals    Frequency    7X/week      PT Plan Current plan remains appropriate    Co-evaluation              AM-PAC PT "6 Clicks" Mobility   Outcome Measure  Help needed turning from your back to your side while in a flat bed without using bedrails?: A Little Help needed moving from lying on your back to sitting on the side of a flat bed without using bedrails?: A Little Help needed moving to and from a bed to a chair (including a wheelchair)?: A Little Help needed standing up from a chair using your arms (e.g., wheelchair or bedside chair)?: A Little Help needed to walk in hospital room?: A Little Help needed climbing 3-5 steps with a railing? : A Lot 6 Click Score: 17    End of Session Equipment Utilized During Treatment: Gait belt;Left knee immobilizer Activity Tolerance: Patient tolerated treatment well Patient left: in chair;with call bell/phone within reach;with chair alarm set Nurse Communication: Mobility status PT Visit Diagnosis: Muscle weakness (generalized) (M62.81);Difficulty in walking, not elsewhere classified (R26.2)     Time: 6440-3474 PT Time Calculation (min) (ACUTE ONLY): 25 min  Charges:  $Gait Training: 23-37 mins                     Delice Bison, PT  Acute Rehab Dept ([facility]) [phone] Pager [phone]  02/06/2020    [facility] 02/06/2020, 1:08 PM

## 2020-02-07 ENCOUNTER — Other Ambulatory Visit: Payer: Self-pay | Admitting: Physician Assistant

## 2020-02-07 DIAGNOSIS — S81002S Unspecified open wound, left knee, sequela: Secondary | ICD-10-CM

## 2020-02-07 LAB — CBC
HCT: 28.8 % — ABNORMAL LOW (ref 36.0–46.0)
HCT: 31 % — ABNORMAL LOW (ref 36.0–46.0)
Hemoglobin: 9.4 g/dL — ABNORMAL LOW (ref 12.0–15.0)
Hemoglobin: 9.4 g/dL — ABNORMAL LOW (ref 12.0–15.0)
MCH: 30.8 pg (ref 26.0–34.0)
MCH: 29.6 pg (ref 26.0–34.0)
MCHC: 32.6 g/dL (ref 30.0–36.0)
MCHC: 30.3 g/dL (ref 30.0–36.0)
MCV: 94.4 fL (ref 80.0–100.0)
MCV: 97.5 fL (ref 80.0–100.0)
Platelets: 269 10*3/uL (ref 150–400)
Platelets: 273 10*3/uL (ref 150–400)
RBC: 3.05 MIL/uL — ABNORMAL LOW (ref 3.87–5.11)
RBC: 3.18 MIL/uL — ABNORMAL LOW (ref 3.87–5.11)
RDW: 14.6 % (ref 11.5–15.5)
RDW: 14.7 % (ref 11.5–15.5)
WBC: 18.3 10*3/uL — ABNORMAL HIGH (ref 4.0–10.5)
WBC: 15.3 10*3/uL — ABNORMAL HIGH (ref 4.0–10.5)
nRBC: 0 % (ref 0.0–0.2)
nRBC: 0 % (ref 0.0–0.2)

## 2020-02-07 MED ORDER — AMLODIPINE BESYLATE 5 MG PO TABS
5.0000 mg | ORAL_TABLET | Freq: Every day | ORAL | Status: DC
  Administered 2020-02-09 – 2020-02-11 (×3): 5 mg via ORAL
  Filled 2020-02-07 (×2): qty 1

## 2020-02-07 NOTE — Plan of Care (Signed)
  Problem: Activity: Goal: Risk for activity intolerance will decrease Outcome: Progressing   Problem: Pain Managment: Goal: General experience of comfort will improve Outcome: Progressing   Problem: Safety: Goal: Ability to remain free from injury will improve Outcome: Progressing   

## 2020-02-07 NOTE — Progress Notes (Signed)
Report given to [facility], patient's belongings all packed up, awaiting transport.

## 2020-02-07 NOTE — Consult Note (Signed)
ORTHOPAEDIC CONSULTATION  REQUESTING PHYSICIAN: [name],*  Chief Complaint: Infected left total knee with large anterior wound.  HPI: Crystal Patterson is a 54 y.o. female who presents with infected left total knee status post surgical debridement polyexchange and debridement of the ischemic skin over the anterior aspect of the patella.  She currently has a cleanse choice wound VAC in place.  There is minimal drainage.  Past Medical History:  Diagnosis Date  . Anginal pain (HCC) 2020  . Arthritis    Knees, hips,  . Elevated troponin 05/24/2016  . Finger amputation, no complication   . Heart murmur   . Hypercholesteremia   . Hypertension   . 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 Lupus [facility])    Past Surgical History:  Procedure Laterality Date  . CARDIAC CATHETERIZATION    . COLONOSCOPY N/A 01/17/2019   Procedure: COLONOSCOPY;  Surgeon: Corbin Ade, MD;  [facility];  Service: Endoscopy;  Laterality: N/A;  10:30  . EXCISIONAL TOTAL KNEE ARTHROPLASTY Left 02/05/2020   Procedure: REPEAT IRRIGATION AND DEBRIDEMENT LEFT KNEE;  Surgeon: [name], MD;  Location: [facility];  Service: Orthopedics;  Laterality: Left;  . I & D KNEE WITH POLY EXCHANGE Left 01/25/2020   Procedure: INCISION AND DRAINAGE LEFT KNEE WITH POLY-LINER EXCHANGE;  Surgeon: [name], MD;  Location: [facility];  Service: Orthopedics;  Laterality: Left;  . IRRIGATION AND DEBRIDEMENT KNEE Left 01/30/2020   Procedure: REPEAT IRRIGATION AND DEBRIDEMENT LEFT KNEE PLACEMENT OF WOUND VAC;  Surgeon: [name], MD;  Location: [facility];  Service: Orthopedics;  Laterality: Left;  . left finger amputations    . POLYPECTOMY  01/17/2019   Procedure: POLYPECTOMY;  Surgeon: Corbin Ade, MD;  [facility];  Service: Endoscopy;;  . right hand surgery    . right knee arthroscopy    . RIGHT/LEFT HEART CATH AND CORONARY ANGIOGRAPHY N/A  05/25/2016   Procedure: Right/Left Heart Cath and Coronary Angiography;  Surgeon: Lennette Bihari, MD;  [facility]: [facility];  Service: Cardiovascular;  Laterality: N/A;  . TOTAL KNEE ARTHROPLASTY Left 01/04/2020   Procedure: LEFT TOTAL KNEE ARTHROPLASTY;  Surgeon: [name], MD;  Location: [facility];  Service: Orthopedics;  Laterality: Left;   Social History   Socioeconomic History  . Marital status: Single    Spouse name: Not on file  . Number of children: Not on file  . Years of education: Not on file  . Highest education level: Not on file  Occupational History  . Occupation: unemployed  Tobacco Use  . Smoking status: Current Every Day Smoker    Packs/day: 0.25    Years: 15.00    Pack years: 3.75    Types: Cigarettes  . Smokeless tobacco: Never Used  Vaping Use  . Vaping Use: Never used  Substance and Sexual Activity  . Alcohol use: Yes    Alcohol/week: 3.0 standard drinks    Types: 3 Cans of beer per week  . Drug use: Yes    Types: Marijuana    Comment: l  . Sexual activity: Not Currently  Other Topics Concern  . Not on file  Social History Narrative   Patient has been living alone; however, plans to discharge home to sister, Theador Hawthorne, house   Social Determinants of Health   Financial Resource Strain: Not on file  Food Insecurity: Not on file  Transportation Needs: Not on file  Physical  Activity: Not on file  Stress: Not on file  Social Connections: Not on file   Family History  Problem Relation Age of Onset  . Pancreatic cancer Mother   . Colon cancer Father    - negative except otherwise stated in the family history section No Known Allergies Prior to Admission medications   Medication Sig Start Date End Date Taking? Authorizing Provider  allopurinol (ZYLOPRIM) 100 MG tablet Take 1 tablet (100 mg total) by mouth daily. 05/27/16  Yes Barrett, Joline Salt, PA-C  amLODipine (NORVASC) 10 MG tablet Take 1 tablet (10 mg total) by mouth daily.  05/27/16  Yes Barrett, Joline Salt, PA-C  aspirin 81 MG chewable tablet Chew 1 tablet (81 mg total) by mouth 2 (two) times daily. Patient taking differently: Chew 81 mg by mouth daily.  01/07/20  Yes [name], MD  atenolol (TENORMIN) 25 MG tablet Take 1 tablet (25 mg total) by mouth daily. 05/27/16  Yes Barrett, Joline Salt, PA-C  atorvastatin (LIPITOR) 40 MG tablet Take 1 tablet (40 mg total) by mouth daily at 6 PM. Patient taking differently: Take 40 mg by mouth daily.  05/26/16  Yes Barrett, Joline Salt, PA-C  benazepril (LOTENSIN) 40 MG tablet Take 1 tablet (40 mg total) by mouth daily. 05/27/16  Yes Barrett, Joline Salt, PA-C  escitalopram (LEXAPRO) 10 MG tablet Take 10 mg by mouth daily. 01/02/20  Yes [provider]  furosemide (LASIX) 40 MG tablet Take 1 tablet (40 mg total) by mouth daily. 05/27/16  Yes Barrett, Joline Salt, PA-C  gabapentin (NEURONTIN) 300 MG capsule Take 300 mg by mouth 2 (two) times daily.    Yes [provider]  ibuprofen (ADVIL,MOTRIN) 800 MG tablet Take 800 mg by mouth 2 (two) times daily.    Yes [provider]  methocarbamol (ROBAXIN) 500 MG tablet Take 1 tablet (500 mg total) by mouth every 6 (six) hours as needed for muscle spasms. 01/07/20  Yes [name], MD  mupirocin ointment (BACTROBAN) 2 % Apply 1 application topically 3 (three) times daily.   Yes [provider]  omeprazole (PRILOSEC) 20 MG capsule Take 20 mg by mouth daily.   Yes [provider]  oxyCODONE (OXY IR/ROXICODONE) 5 MG immediate release tablet Take 1-2 tablets (5-10 mg total) by mouth every 4 (four) hours as needed for moderate pain (pain score 4-6). 01/17/20  Yes [name], MD  potassium chloride (K-DUR) 10 MEQ tablet Take 10 mEq by mouth daily.    Yes [provider]  spironolactone (ALDACTONE) 25 MG tablet Take 25 mg by mouth daily.   Yes [provider]  zolpidem (AMBIEN) 10 MG tablet Take 10 mg by mouth at  bedtime as needed for sleep.    Yes [provider]  albuterol (PROVENTIL HFA;VENTOLIN HFA) 108 (90 Base) MCG/ACT inhaler Inhale 2 puffs into the lungs every 6 (six) hours as needed for wheezing or shortness of breath.  Patient not taking: Reported on 01/27/2020    [provider]  KLOR-CON M10 10 MEQ tablet Take 10 mEq by mouth daily. Patient not taking: Reported on 01/27/2020 01/13/20   [provider]   No results found. - pertinent xrays, CT, MRI studies were reviewed and independently interpreted  Positive ROS: All other systems have been reviewed and were otherwise negative with the exception of those mentioned in the HPI and as above.  Physical Exam: General: Alert, no acute distress Psychiatric: Patient is competent for consent with normal mood  and affect Lymphatic: No axillary or cervical lymphadenopathy Cardiovascular: No pedal edema Respiratory: No cyanosis, no use of accessory musculature GI: No organomegaly, abdomen is soft and non-tender    Images:  @ENCIMAGES @  Labs:  Lab Results  Component Value Date   HGBA1C 4.6 (L) 05/25/2016   ESRSEDRATE 42 (H) 02/04/2020   ESRSEDRATE 70 (H) 01/26/2020   ESRSEDRATE 6 07/09/2008   CRP 8.4 (H) 02/04/2020   CRP 14.8 (H) 01/26/2020   CRP 0.3 07/09/2008   REPTSTATUS 01/31/2020 FINAL 01/25/2020   GRAMSTAIN NO WBC SEEN RARE GRAM NEGATIVE RODS  01/25/2020   CULT  01/25/2020    RARE PROTEUS MIRABILIS RARE ESCHERICHIA COLI NO ANAEROBES ISOLATED Performed at [facility], [address], [place], [place]    [id number] PROTEUS MIRABILIS 01/25/2020   LABORGA ESCHERICHIA COLI 01/25/2020    Lab Results  Component Value Date   ALBUMIN 2.5 (L) 02/04/2020   ALBUMIN 3.4 (L) 12/06/2016   ALBUMIN 3.2 (L) 05/25/2016    Neurologic: Patient does not have protective sensation bilateral lower extremities.   MUSCULOSKELETAL:   Skin: Examination the wound VAC is in place there is no  cellulitis patient has no symptoms at this time she is comfortable.  Patient's white cell count did increase to 18.3 her hemoglobin is stable at 9.4.  Albumin 2.5.  Assessment: Assessment: Status post joint salvage intervention with a large anterior wound over the patella.  Plan: Plan: We will plan for further debridement of the wound over the anterior aspect of the patella plan to place [facility] xenograft skin graft.  Will place the cleanse choice wound VAC in place anticipate 6 weeks of IV antibiotics.  Surgery scheduled for tomorrow Friday.  N.p.o. after midnight tonight.  Thank you for the consult and the opportunity to see Ms. Thursday, MD Glade Lloyd [phone] 8:20 AM

## 2020-02-07 NOTE — Progress Notes (Signed)
12/09 0545 Pt arrived to the through carelink. Pt stable. Pt complained of pain. Pain given. Pt has flat affect.   12/09 0645 Pt is stable and comfortable.

## 2020-02-07 NOTE — Progress Notes (Signed)
Report given to Myra, RN on [address]

## 2020-02-07 NOTE — H&P (View-Only) (Signed)
ORTHOPAEDIC CONSULTATION  REQUESTING PHYSICIAN: [name],*  Chief Complaint: Infected left total knee with large anterior wound.  HPI: Crystal Patterson is a 54 y.o. female who presents with infected left total knee status post surgical debridement polyexchange and debridement of the ischemic skin over the anterior aspect of the patella.  She currently has a cleanse choice wound VAC in place.  There is minimal drainage.  Past Medical History:  Diagnosis Date  . Anginal pain (HCC) 2020  . Arthritis    Knees, hips,  . Elevated troponin 05/24/2016  . Finger amputation, no complication   . Heart murmur   . Hypercholesteremia   . Hypertension   . 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 Lupus [facility])    Past Surgical History:  Procedure Laterality Date  . CARDIAC CATHETERIZATION    . COLONOSCOPY N/A 01/17/2019   Procedure: COLONOSCOPY;  Surgeon: Corbin Ade, MD;  [facility];  Service: Endoscopy;  Laterality: N/A;  10:30  . EXCISIONAL TOTAL KNEE ARTHROPLASTY Left 02/05/2020   Procedure: REPEAT IRRIGATION AND DEBRIDEMENT LEFT KNEE;  Surgeon: [name], MD;  [facility];  Service: Orthopedics;  Laterality: Left;  . I & D KNEE WITH POLY EXCHANGE Left 01/25/2020   Procedure: INCISION AND DRAINAGE LEFT KNEE WITH POLY-LINER EXCHANGE;  Surgeon: [name], MD;  Location: [facility];  Service: Orthopedics;  Laterality: Left;  . IRRIGATION AND DEBRIDEMENT KNEE Left 01/30/2020   Procedure: REPEAT IRRIGATION AND DEBRIDEMENT LEFT KNEE PLACEMENT OF WOUND VAC;  Surgeon: [name], MD;  Location: [facility];  Service: Orthopedics;  Laterality: Left;  . left finger amputations    . POLYPECTOMY  01/17/2019   Procedure: POLYPECTOMY;  Surgeon: Corbin Ade, MD;  [facility];  Service: Endoscopy;;  . right hand surgery    . right knee arthroscopy    . RIGHT/LEFT HEART CATH AND CORONARY ANGIOGRAPHY N/A  05/25/2016   Procedure: Right/Left Heart Cath and Coronary Angiography;  Surgeon: Lennette Bihari, MD;  [facility];  Service: Cardiovascular;  Laterality: N/A;  . TOTAL KNEE ARTHROPLASTY Left 01/04/2020   Procedure: LEFT TOTAL KNEE ARTHROPLASTY;  Surgeon: [name], MD;  Location: [facility];  Service: Orthopedics;  Laterality: Left;   Social History   Socioeconomic History  . Marital status: Single    Spouse name: Not on file  . Number of children: Not on file  . Years of education: Not on file  . Highest education level: Not on file  Occupational History  . Occupation: unemployed  Tobacco Use  . Smoking status: Current Every Day Smoker    Packs/day: 0.25    Years: 15.00    Pack years: 3.75    Types: Cigarettes  . Smokeless tobacco: Never Used  Vaping Use  . Vaping Use: Never used  Substance and Sexual Activity  . Alcohol use: Yes    Alcohol/week: 3.0 standard drinks    Types: 3 Cans of beer per week  . Drug use: Yes    Types: Marijuana    Comment: l  . Sexual activity: Not Currently  Other Topics Concern  . Not on file  Social History Narrative   Patient has been living alone; however, plans to discharge home to sister, Theador Hawthorne, house   Social Determinants of Health   Financial Resource Strain: Not on file  Food Insecurity: Not on file  Transportation Needs: Not on file  Physical  Activity: Not on file  Stress: Not on file  Social Connections: Not on file   Family History  Problem Relation Age of Onset  . Pancreatic cancer Mother   . Colon cancer Father    - negative except otherwise stated in the family history section No Known Allergies Prior to Admission medications   Medication Sig Start Date End Date Taking? Authorizing Provider  allopurinol (ZYLOPRIM) 100 MG tablet Take 1 tablet (100 mg total) by mouth daily. 05/27/16  Yes Barrett, Joline Salt, PA-C  amLODipine (NORVASC) 10 MG tablet Take 1 tablet (10 mg total) by mouth daily.  05/27/16  Yes Barrett, Joline Salt, PA-C  aspirin 81 MG chewable tablet Chew 1 tablet (81 mg total) by mouth 2 (two) times daily. Patient taking differently: Chew 81 mg by mouth daily.  01/07/20  Yes [name], MD  atenolol (TENORMIN) 25 MG tablet Take 1 tablet (25 mg total) by mouth daily. 05/27/16  Yes Barrett, Joline Salt, PA-C  atorvastatin (LIPITOR) 40 MG tablet Take 1 tablet (40 mg total) by mouth daily at 6 PM. Patient taking differently: Take 40 mg by mouth daily.  05/26/16  Yes Barrett, Joline Salt, PA-C  benazepril (LOTENSIN) 40 MG tablet Take 1 tablet (40 mg total) by mouth daily. 05/27/16  Yes Barrett, Joline Salt, PA-C  escitalopram (LEXAPRO) 10 MG tablet Take 10 mg by mouth daily. 01/02/20  Yes [provider]  furosemide (LASIX) 40 MG tablet Take 1 tablet (40 mg total) by mouth daily. 05/27/16  Yes Barrett, Joline Salt, PA-C  gabapentin (NEURONTIN) 300 MG capsule Take 300 mg by mouth 2 (two) times daily.    Yes [provider]  ibuprofen (ADVIL,MOTRIN) 800 MG tablet Take 800 mg by mouth 2 (two) times daily.    Yes [provider]  methocarbamol (ROBAXIN) 500 MG tablet Take 1 tablet (500 mg total) by mouth every 6 (six) hours as needed for muscle spasms. 01/07/20  Yes [name], MD  mupirocin ointment (BACTROBAN) 2 % Apply 1 application topically 3 (three) times daily.   Yes [provider]  omeprazole (PRILOSEC) 20 MG capsule Take 20 mg by mouth daily.   Yes [provider]  oxyCODONE (OXY IR/ROXICODONE) 5 MG immediate release tablet Take 1-2 tablets (5-10 mg total) by mouth every 4 (four) hours as needed for moderate pain (pain score 4-6). 01/17/20  Yes [name], MD  potassium chloride (K-DUR) 10 MEQ tablet Take 10 mEq by mouth daily.    Yes [provider]  spironolactone (ALDACTONE) 25 MG tablet Take 25 mg by mouth daily.   Yes [provider]  zolpidem (AMBIEN) 10 MG tablet Take 10 mg by mouth at  bedtime as needed for sleep.    Yes [provider]  albuterol (PROVENTIL HFA;VENTOLIN HFA) 108 (90 Base) MCG/ACT inhaler Inhale 2 puffs into the lungs every 6 (six) hours as needed for wheezing or shortness of breath.  Patient not taking: Reported on 01/27/2020    [provider]  KLOR-CON M10 10 MEQ tablet Take 10 mEq by mouth daily. Patient not taking: Reported on 01/27/2020 01/13/20   [provider]   No results found. - pertinent xrays, CT, MRI studies were reviewed and independently interpreted  Positive ROS: All other systems have been reviewed and were otherwise negative with the exception of those mentioned in the HPI and as above.  Physical Exam: General: Alert, no acute distress Psychiatric: Patient is competent for consent with normal mood  and affect Lymphatic: No axillary or cervical lymphadenopathy Cardiovascular: No pedal edema Respiratory: No cyanosis, no use of accessory musculature GI: No organomegaly, abdomen is soft and non-tender    Images:  @ENCIMAGES @  Labs:  Lab Results  Component Value Date   HGBA1C 4.6 (L) 05/25/2016   ESRSEDRATE 42 (H) 02/04/2020   ESRSEDRATE 70 (H) 01/26/2020   ESRSEDRATE 6 07/09/2008   CRP 8.4 (H) 02/04/2020   CRP 14.8 (H) 01/26/2020   CRP 0.3 07/09/2008   REPTSTATUS 01/31/2020 FINAL 01/25/2020   GRAMSTAIN NO WBC SEEN RARE GRAM NEGATIVE RODS  01/25/2020   CULT  01/25/2020    RARE PROTEUS MIRABILIS RARE ESCHERICHIA COLI NO ANAEROBES ISOLATED Performed at [facility], [address], [place], [place]    [id number] PROTEUS MIRABILIS 01/25/2020   LABORGA ESCHERICHIA COLI 01/25/2020    Lab Results  Component Value Date   ALBUMIN 2.5 (L) 02/04/2020   ALBUMIN 3.4 (L) 12/06/2016   ALBUMIN 3.2 (L) 05/25/2016    Neurologic: Patient does not have protective sensation bilateral lower extremities.   MUSCULOSKELETAL:   Skin: Examination the wound VAC is in place there is no  cellulitis patient has no symptoms at this time she is comfortable.  Patient's white cell count did increase to 18.3 her hemoglobin is stable at 9.4.  Albumin 2.5.  Assessment: Assessment: Status post joint salvage intervention with a large anterior wound over the patella.  Plan: Plan: We will plan for further debridement of the wound over the anterior aspect of the patella plan to place [facility] xenograft skin graft.  Will place the cleanse choice wound VAC in place anticipate 6 weeks of IV antibiotics.  Surgery scheduled for tomorrow Friday.  N.p.o. after midnight tonight.  Thank you for the consult and the opportunity to see Ms. Thursday, MD Glade Lloyd [phone] 8:20 AM

## 2020-02-07 NOTE — Progress Notes (Signed)
Physical Therapy Treatment Patient Details Name: Crystal Patterson MRN: [id number] DOB: 11/16/1965 Today's Date: 02/07/2020    History of Present Illness Pt is a 54 y.o. female s/p initial L TKA on 01/04/20, now admitted 01/25/20 with drainage from L knee incision concerning for infection. S/p L knee I&Ds on 11/26, 12/1 and 12/6. Plan for return to OR 12/10 for further debridement of patellar wound and skin graft. PMH includes arthritis, HTN, bilateral finger amputations.   PT Comments    Pt progressing with mobility. Motivated to participate despite increased pain and fatigue this session. Plan for return to OR tomorrow for additional debridement and skin graft. Will continue to follow acutely.   Follow Up Recommendations  Home health PT;Supervision for mobility/OOB     Equipment Recommendations  None recommended by PT    Recommendations for Other Services       Precautions / Restrictions Precautions Precautions: Knee;Fall Precaution Comments: gentle/"light ROM",  "nothing aggressive" per Ortho order; L knee wound vac Restrictions Weight Bearing Restrictions: Yes LLE Weight Bearing: Weight bearing as tolerated    Mobility  Bed Mobility Overal bed mobility: Needs Assistance Bed Mobility: Supine to Sit     Supine to sit: Min assist;HOB elevated     General bed mobility comments: MinA for LLE management as pt limited by pain  Transfers Overall transfer level: Needs assistance Equipment used: Rolling walker (2 wheeled) Transfers: Sit to/from Stand Sit to Stand: Min guard;Supervision         General transfer comment: Initial min guard standing from EOB with increased time and effort due to pain; 2x sit<>stand from recliner to RW with supervision  Ambulation/Gait Ambulation/Gait assistance: Min guard;Supervision Gait Distance (Feet): 24 Feet Assistive device: Rolling walker (2 wheeled) Gait Pattern/deviations: Step-through pattern;Decreased stride length;Antalgic;Trunk  flexed;Decreased weight shift to left Gait velocity: Decreased   General Gait Details: Slow, antalgic gait with RW and intermittent min guard for balance; pt with difficulty reaching L heel to floor due to pain despite cues; pt declined further distance due to pain and fatigue   Stairs             Wheelchair Mobility    Modified Rankin (Stroke Patients Only)       Balance Overall balance assessment: Needs assistance Sitting-balance support: Feet supported Sitting balance-Leahy Scale: Fair     Standing balance support: Bilateral upper extremity supported;During functional activity;Single extremity supported Standing balance-Leahy Scale: Poor Standing balance comment: Reliant on UE support                            Cognition Arousal/Alertness: Awake/alert Behavior During Therapy: WFL for tasks assessed/performed;Flat affect Overall Cognitive Status: Within Functional Limits for tasks assessed                                 General Comments: WFL for simple tasks, but not formally assessed. Flat affect, but interacting and laughing appropriately; pleasant and cooperative      Exercises Other Exercises Other Exercises: Gentle seated calf stretch with strap (~20-sec holds x5) since pt having difficulty reaching heel to floor with standing; slow LAQ through partial range (lacking full extension)    General Comments        Pertinent Vitals/Pain Pain Assessment: Faces Faces Pain Scale: Hurts little more Pain Location: L knee Pain Descriptors / Indicators: Discomfort;Grimacing;Guarding Pain Intervention(s): Monitored during session;Limited activity within patient's tolerance;Repositioned  Home Living                      Prior Function            PT Goals (current goals can now be found in the care plan section) Progress towards PT goals: Progressing toward goals    Frequency    Min 5X/week      PT Plan Frequency  needs to be updated    Co-evaluation              AM-PAC PT "6 Clicks" Mobility   Outcome Measure  Help needed turning from your back to your side while in a flat bed without using bedrails?: A Little Help needed moving from lying on your back to sitting on the side of a flat bed without using bedrails?: A Little Help needed moving to and from a bed to a chair (including a wheelchair)?: A Little Help needed standing up from a chair using your arms (e.g., wheelchair or bedside chair)?: A Little Help needed to walk in hospital room?: A Little Help needed climbing 3-5 steps with a railing? : A Little 6 Click Score: 18    End of Session Equipment Utilized During Treatment: Gait belt Activity Tolerance: Patient tolerated treatment well Patient left: in chair;with call bell/phone within reach;with chair alarm set Nurse Communication: Mobility status PT Visit Diagnosis: Muscle weakness (generalized) (M62.81);Difficulty in walking, not elsewhere classified (R26.2)     Time: 9381-8299 PT Time Calculation (min) (ACUTE ONLY): 20 min  Charges:  $Gait Training: 8-22 mins                    Ina Homes, PT, DPT Acute Rehabilitation Services  Pager [phone] Office [phone]  Malachy Chamber 02/07/2020, 4:53 PM

## 2020-02-07 NOTE — Progress Notes (Signed)
Attempted to call report to [facility], RN was in a patient's room, phone number left for call back.

## 2020-02-07 NOTE — Plan of Care (Signed)
  Problem: Clinical Measurements: Goal: Ability to maintain clinical measurements within normal limits will improve Outcome: Progressing Goal: Postoperative complications will be avoided or minimized Outcome: Progressing   Problem: Skin Integrity: Goal: Demonstration of wound healing without infection will improve Outcome: Progressing   Problem: Health Behavior/Discharge Planning: Goal: Ability to manage health-related needs will improve Outcome: Progressing   Problem: Clinical Measurements: Goal: Ability to maintain clinical measurements within normal limits will improve Outcome: Progressing Goal: Will remain free from infection Outcome: Progressing Goal: Diagnostic test results will improve Outcome: Progressing Goal: Cardiovascular complication will be avoided Outcome: Progressing   Problem: Activity: Goal: Risk for activity intolerance will decrease Outcome: Progressing   Problem: Nutrition: Goal: Adequate nutrition will be maintained Outcome: Progressing   Problem: Elimination: Goal: Will not experience complications related to bowel motility Outcome: Progressing   Problem: Pain Managment: Goal: General experience of comfort will improve Outcome: Progressing   Problem: Safety: Goal: Ability to remain free from injury will improve Outcome: Progressing   Problem: Skin Integrity: Goal: Risk for impaired skin integrity will decrease Outcome: Progressing

## 2020-02-07 NOTE — Plan of Care (Signed)
  Problem: Health Behavior/Discharge Planning: Goal: Ability to manage health-related needs will improve Outcome: Progressing   Problem: Activity: Goal: Risk for activity intolerance will decrease Outcome: Progressing   Problem: Coping: Goal: Level of anxiety will decrease Outcome: Progressing   

## 2020-02-07 NOTE — Progress Notes (Signed)
Patient ID: Crystal Patterson, female   DOB: 11/27/1965, 54 y.o.   MRN: [id number] The patient was successfully transferred to the orthopedic unit at [facility]. The plan is for my partner Dr. Lajoyce Corners to consult on this patient for her complex wound and likely take her to the operating room for continued wound care. Her white blood cell count yesterday was down to nine. It is up to 18,000 this morning but I believe this may be pain related. She has normal vitals and looks good overall.

## 2020-02-08 ENCOUNTER — Encounter (HOSPITAL_COMMUNITY)
Admission: RE | Disposition: A | Discharge status: Home Health | Payer: Self-pay | Source: Home / Self Care | Attending: Orthopaedic Surgery

## 2020-02-08 ENCOUNTER — Inpatient Hospital Stay (HOSPITAL_COMMUNITY): Payer: Medicare Other | Admitting: Certified Registered Nurse Anesthetist

## 2020-02-08 ENCOUNTER — Encounter (HOSPITAL_COMMUNITY): Payer: Self-pay | Admitting: Orthopaedic Surgery

## 2020-02-08 HISTORY — PX: I & D EXTREMITY: SHX5045

## 2020-02-08 HISTORY — PX: SKIN SPLIT GRAFT: SHX444

## 2020-02-08 SURGERY — IRRIGATION AND DEBRIDEMENT EXTREMITY
Anesthesia: General | Site: Knee | Laterality: Left

## 2020-02-08 MED ORDER — OXYCODONE HCL 5 MG PO TABS: 5.0000 mg | ORAL_TABLET | Freq: Once | ORAL | Status: DC | PRN

## 2020-02-08 MED ORDER — SODIUM CHLORIDE 0.9 % IV SOLN: INTRAVENOUS | Status: DC

## 2020-02-08 MED ORDER — 0.9 % SODIUM CHLORIDE (POUR BTL) OPTIME
TOPICAL | Status: DC | PRN
  Administered 2020-02-08: 1000 mL

## 2020-02-08 MED ORDER — FENTANYL CITRATE (PF) 100 MCG/2ML IJ SOLN
INTRAMUSCULAR | Status: AC
  Filled 2020-02-08: qty 2

## 2020-02-08 MED ORDER — MIDAZOLAM HCL 2 MG/2ML IJ SOLN
INTRAMUSCULAR | Status: AC
  Filled 2020-02-08: qty 2

## 2020-02-08 MED ORDER — FENTANYL CITRATE (PF) 250 MCG/5ML IJ SOLN
INTRAMUSCULAR | Status: DC | PRN
  Administered 2020-02-08 (×2): 25 ug via INTRAVENOUS
  Administered 2020-02-08: 50 ug via INTRAVENOUS

## 2020-02-08 MED ORDER — LACTATED RINGERS IV SOLN: INTRAVENOUS | Status: DC

## 2020-02-08 MED ORDER — CHLORHEXIDINE GLUCONATE 0.12 % MT SOLN
OROMUCOSAL | Status: AC
  Administered 2020-02-08: 15 mL via OROMUCOSAL
  Filled 2020-02-08: qty 15

## 2020-02-08 MED ORDER — CHLORHEXIDINE GLUCONATE 0.12 % MT SOLN: 15.0000 mL | Freq: Once | OROMUCOSAL | Status: AC

## 2020-02-08 MED ORDER — FENTANYL CITRATE (PF) 100 MCG/2ML IJ SOLN
25.0000 ug | INTRAMUSCULAR | Status: DC | PRN
Start: 2020-02-08 — End: 2020-02-08
  Administered 2020-02-08 (×2): 50 ug via INTRAVENOUS

## 2020-02-08 MED ORDER — DEXAMETHASONE SODIUM PHOSPHATE 4 MG/ML IJ SOLN
INTRAMUSCULAR | Status: DC | PRN
  Administered 2020-02-08: 4 mg via INTRAVENOUS

## 2020-02-08 MED ORDER — OXYCODONE HCL 5 MG/5ML PO SOLN: 5.0000 mg | Freq: Once | ORAL | Status: DC | PRN

## 2020-02-08 MED ORDER — PHENYLEPHRINE HCL-NACL 10-0.9 MG/250ML-% IV SOLN
INTRAVENOUS | Status: DC | PRN
  Administered 2020-02-08: 20 ug/min via INTRAVENOUS

## 2020-02-08 MED ORDER — CEFAZOLIN SODIUM-DEXTROSE 2-4 GM/100ML-% IV SOLN
2.0000 g | INTRAVENOUS | Status: AC
  Administered 2020-02-08: 2 g via INTRAVENOUS
  Filled 2020-02-08 (×2): qty 100

## 2020-02-08 MED ORDER — AMISULPRIDE (ANTIEMETIC) 5 MG/2ML IV SOLN: 10.0000 mg | Freq: Once | INTRAVENOUS | Status: DC | PRN

## 2020-02-08 MED ORDER — ONDANSETRON HCL 4 MG/2ML IJ SOLN
INTRAMUSCULAR | Status: DC | PRN
  Administered 2020-02-08: 4 mg via INTRAVENOUS

## 2020-02-08 MED ORDER — FENTANYL CITRATE (PF) 250 MCG/5ML IJ SOLN
INTRAMUSCULAR | Status: AC
  Filled 2020-02-08: qty 5

## 2020-02-08 MED ORDER — PROPOFOL 10 MG/ML IV BOLUS
INTRAVENOUS | Status: DC | PRN
  Administered 2020-02-08: 150 mg via INTRAVENOUS

## 2020-02-08 MED ORDER — ONDANSETRON HCL 4 MG/2ML IJ SOLN: 4.0000 mg | Freq: Once | INTRAMUSCULAR | Status: DC | PRN

## 2020-02-08 SURGICAL SUPPLY — 56 items
BLADE SURG 10 STRL SS (BLADE) ×1 IMPLANT
BLADE SURG 21 STRL SS (BLADE) ×2 IMPLANT
BNDG CMPR 9X4 STRL LF SNTH (GAUZE/BANDAGES/DRESSINGS)
BNDG COHESIVE 6X5 TAN STRL LF (GAUZE/BANDAGES/DRESSINGS) ×1 IMPLANT
BNDG ESMARK 4X9 LF (GAUZE/BANDAGES/DRESSINGS) ×1 IMPLANT
BNDG GAUZE ELAST 4 BULKY (GAUZE/BANDAGES/DRESSINGS) ×2 IMPLANT
COVER SURGICAL LIGHT HANDLE (MISCELLANEOUS) ×3 IMPLANT
COVER WAND RF STERILE (DRAPES) ×1 IMPLANT
CUFF TOURN SGL QUICK 18X4 (TOURNIQUET CUFF) IMPLANT
CUFF TOURN SGL QUICK 24 (TOURNIQUET CUFF)
CUFF TRNQT CYL 24X4X16.5-23 (TOURNIQUET CUFF) IMPLANT
DERMACARRIERS GRAFT 1 TO 1.5 (DISPOSABLE)
DRAPE DERMATAC (DRAPES) ×1 IMPLANT
DRAPE U-SHAPE 47X51 STRL (DRAPES) ×1 IMPLANT
DRESSING VERAFLO CLEANSE CC (GAUZE/BANDAGES/DRESSINGS) IMPLANT
DRSG ADAPTIC 3X8 NADH LF (GAUZE/BANDAGES/DRESSINGS) ×1 IMPLANT
DRSG MEPITEL 4X7.2 (GAUZE/BANDAGES/DRESSINGS) ×1 IMPLANT
DRSG VERAFLO CLEANSE CC (GAUZE/BANDAGES/DRESSINGS) ×2
DURAPREP 26ML APPLICATOR (WOUND CARE) ×2 IMPLANT
ELECT REM PT RETURN 9FT ADLT (ELECTROSURGICAL) ×2
ELECTRODE REM PT RTRN 9FT ADLT (ELECTROSURGICAL) ×1 IMPLANT
GAUZE SPONGE 4X4 12PLY STRL (GAUZE/BANDAGES/DRESSINGS) ×2 IMPLANT
GLOVE BIOGEL PI IND STRL 9 (GLOVE) ×1 IMPLANT
GLOVE BIOGEL PI INDICATOR 9 (GLOVE) ×1
GLOVE SURG ORTHO 9.0 STRL STRW (GLOVE) ×2 IMPLANT
GOWN STRL REUS W/ TWL XL LVL3 (GOWN DISPOSABLE) ×2 IMPLANT
GOWN STRL REUS W/TWL XL LVL3 (GOWN DISPOSABLE) ×6
GRAFT DERMACARRIERS 1 TO 1.5 (DISPOSABLE) IMPLANT
GRAFT SKIN WND OMEGA3 7X10 (Tissue) ×2 IMPLANT
GRAFT SKN 7X10XSTRL LF DISP (Tissue) IMPLANT
HANDPIECE INTERPULSE COAX TIP (DISPOSABLE)
KIT BASIN OR (CUSTOM PROCEDURE TRAY) ×2 IMPLANT
KIT TURNOVER KIT B (KITS) ×2 IMPLANT
MANIFOLD NEPTUNE II (INSTRUMENTS) ×2 IMPLANT
NDL HYPO 25GX1X1/2 BEV (NEEDLE) IMPLANT
NEEDLE HYPO 25GX1X1/2 BEV (NEEDLE) IMPLANT
NS IRRIG 1000ML POUR BTL (IV SOLUTION) ×2 IMPLANT
PACK ORTHO EXTREMITY (CUSTOM PROCEDURE TRAY) ×2 IMPLANT
PAD ARMBOARD 7.5X6 YLW CONV (MISCELLANEOUS) ×2 IMPLANT
PAD CAST 4YDX4 CTTN HI CHSV (CAST SUPPLIES) IMPLANT
PAD NEG PRESSURE SENSATRAC (MISCELLANEOUS) ×1 IMPLANT
PADDING CAST COTTON 4X4 STRL (CAST SUPPLIES)
SET HNDPC FAN SPRY TIP SCT (DISPOSABLE) IMPLANT
STOCKINETTE IMPERVIOUS 9X36 MD (GAUZE/BANDAGES/DRESSINGS) IMPLANT
SUCTION FRAZIER HANDLE 10FR (MISCELLANEOUS)
SUCTION TUBE FRAZIER 10FR DISP (MISCELLANEOUS) IMPLANT
SUT ETHILON 2 0 PSLX (SUTURE) ×3 IMPLANT
SUT ETHILON 4 0 PS 2 18 (SUTURE) IMPLANT
SWAB COLLECTION DEVICE MRSA (MISCELLANEOUS) ×2 IMPLANT
SWAB CULTURE ESWAB REG 1ML (MISCELLANEOUS) ×1 IMPLANT
SYR CONTROL 10ML LL (SYRINGE) IMPLANT
TOWEL GREEN STERILE (TOWEL DISPOSABLE) ×2 IMPLANT
TOWEL GREEN STERILE FF (TOWEL DISPOSABLE) ×2 IMPLANT
TUBE CONNECTING 12X1/4 (SUCTIONS) ×2 IMPLANT
WATER STERILE IRR 1000ML POUR (IV SOLUTION) ×2 IMPLANT
YANKAUER SUCT BULB TIP NO VENT (SUCTIONS) ×3 IMPLANT

## 2020-02-08 NOTE — Anesthesia Preprocedure Evaluation (Signed)
Anesthesia Evaluation  Patient identified by MRN, date of birth, ID band Patient awake    Reviewed: Allergy & Precautions, H&P , NPO status , Patient's Chart, lab work & pertinent test results, reviewed documented beta blocker date and time   Airway Mallampati: I  TM Distance: >3 FB Neck ROM: Full    Dental  (+) Teeth Intact   Pulmonary asthma , Current Smoker and Patient abstained from smoking.,    Pulmonary exam normal        Cardiovascular hypertension, Pt. on medications and Pt. on home beta blockers + angina + CAD and + Peripheral Vascular Disease   Rhythm:Regular Rate:Normal     Neuro/Psych  Headaches, PSYCHIATRIC DISORDERS Depression negative neurological ROS     GI/Hepatic Neg liver ROS, GERD  Medicated,  Endo/Other  negative endocrine ROS  Renal/GU negative Renal ROS  negative genitourinary   Musculoskeletal  (+) Arthritis , Left knee infection s/p TKR 11/21   Abdominal (+)  Abdomen: soft. Bowel sounds: normal.  Peds negative pediatric ROS (+)  Hematology negative hematology ROS (+)   Anesthesia Other Findings  Lupus  Reproductive/Obstetrics negative OB ROS                             Anesthesia Physical  Anesthesia Plan  ASA: III  Anesthesia Plan: General   Post-op Pain Management:    Induction: Intravenous  PONV Risk Score and Plan: 2 and Ondansetron, Dexamethasone and Treatment may vary due to age or medical condition  Airway Management Planned: LMA  Additional Equipment: None  Intra-op Plan:   Post-operative Plan: Extubation in OR  Informed Consent: I have reviewed the patients History and Physical, chart, labs and discussed the procedure including the risks, benefits and alternatives for the proposed anesthesia with the patient or authorized representative who has indicated his/her understanding and acceptance.       Plan Discussed with: CRNA and  Anesthesiologist  Anesthesia Plan Comments:         Anesthesia Quick Evaluation

## 2020-02-08 NOTE — Transfer of Care (Signed)
Immediate Anesthesia Transfer of Care Note  Patient: Crystal Patterson  Procedure(s) Performed: EXCISIONAL DEBRIDEMENT LEFT KNEE (Left Knee) SKIN GRAFT SPLIT THICKNESS LEFT KNEE (Left Knee)  Patient Location: PACU  Anesthesia Type:General  Level of Consciousness: drowsy and patient cooperative  Airway & Oxygen Therapy: Patient Spontanous Breathing  Post-op Assessment: Report given to RN, Post -op Vital signs reviewed and stable and Patient moving all extremities X 4  Post vital signs: Reviewed and stable  Last Vitals:  Vitals Value Taken Time  BP 121/92 02/08/20 1218  Temp 36.4 C 02/08/20 1215  Pulse 70 02/08/20 1224  Resp 12 02/08/20 1224  SpO2 100 % 02/08/20 1224  Vitals shown include unvalidated device data.  Last Pain:  Vitals:   02/08/20 0937  TempSrc:   PainSc: 8       Patients Stated Pain Goal: 3 (02/08/20 5852)  Complications: No complications documented.

## 2020-02-08 NOTE — TOC Initial Note (Signed)
Transition of Care [facility]) - Initial/Assessment Note    Patient Details  Name: Crystal Patterson MRN: [id number] Date of Birth: Jul 07, 1965  Transition of Care [facility]) CM/SW Contact:    Epifanio Lesches, RN Phone Number: 02/08/2020, 1:34 PM  Clinical Narrative:                 Pt tx from [facility], 12/9. Pt with recent LTKA., 11/5. Presents this admit with L knee pain, drainage.   -   s/p I & D   11/26, 12/2, 12/8, 12/10  Pt from home alone. NCM spoke with pt in regard to d/c planning. Pt states she doesn't want SNF placement. Agreeable with the resumption of HH services provided by [facility] which was setup with previous admit. Resumption orders (HHRN,PT) will be needed from MD. Pt states once d/c hoping to d/c to daughter's home : [address] [place], [place].   Expected Discharge Plan: Home w Home Health Services (vs SNF) Barriers to Discharge: Continued Medical Work up   Patient Goals and CMS Choice Patient states their goals for this hospitalization and ongoing recovery are:: To get better and go home   Choice offered to / list presented to : Patient  Expected Discharge Plan and Services Expected Discharge Plan: Home w Home Health Services (vs SNF)   Discharge Planning Services: CM Consult   Living arrangements for the past 2 months: Single Family Home                                      Prior Living Arrangements/Services Living arrangements for the past 2 months: Single Family Home   Patient language and need for interpreter reviewed:: Yes Do you feel safe going back to the place where you live?: Yes      Need for Family Participation in Patient Care: Yes (Comment) Care giver support system in place?: Yes (comment)   Criminal Activity/Legal Involvement Pertinent to Current Situation/Hospitalization: No - Comment as needed  Activities of Daily Living Home Assistive Devices/Equipment: Eyeglasses,Cane (specify quad or straight),Wheelchair,Shower chair with  back,Hand-held shower hose ADL Screening (condition at time of admission) Patient's cognitive ability adequate to safely complete daily activities?: Yes Is the patient deaf or have difficulty hearing?: No Does the patient have difficulty seeing, even when wearing glasses/contacts?: No Does the patient have difficulty concentrating, remembering, or making decisions?: No Patient able to express need for assistance with ADLs?: Yes Does the patient have difficulty dressing or bathing?: Yes Independently performs ADLs?: No Communication: Independent Dressing (OT): Needs assistance Is this a change from baseline?: Pre-admission baseline Grooming: Needs assistance Is this a change from baseline?: Pre-admission baseline Feeding: Independent Bathing: Needs assistance Is this a change from baseline?: Pre-admission baseline Toileting: Needs assistance Is this a change from baseline?: Pre-admission baseline In/Out Bed: Needs assistance Is this a change from baseline?: Pre-admission baseline Walks in Home: Dependent Is this a change from baseline?: Pre-admission baseline Does the patient have difficulty walking or climbing stairs?: Yes Weakness of Legs: Left Weakness of Arms/Hands: Both  Permission Sought/Granted                  Emotional Assessment Appearance:: Appears stated age     Orientation: : Oriented to Self,Oriented to Place,Oriented to  Time,Oriented to Situation Alcohol / Substance Use: Not Applicable Psych Involvement: No (comment)  Admission diagnosis:  Infection of total left knee replacement (HCC) [T84.54XA]  Patient Active Problem List   Diagnosis Date Noted  . Open knee wound, left, sequela   . 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 Thrombosis of left ulnar artery (HCC) 01/23/2020  . Status post total knee replacement 01/06/2020  . Status post total left knee  replacement 01/06/2020  . Status post total knee replacement, left 01/04/2020  . Unilateral primary osteoarthritis, left knee 09/26/2019  . Unilateral primary osteoarthritis, right knee 09/26/2019  . Systemic lupus erythematosus arthritis (HCC) 09/12/2019  . Arthritis of both knees 09/12/2019  . CAD (coronary artery disease) 07/14/2017  . Tobacco use disorder 12/30/2016  . Metabolic bone disease 12/30/2016  . Marijuana use 12/30/2016  . Syncope 12/06/2016  . ETOH abuse 12/06/2016  . PAD (peripheral artery disease) (HCC) 12/06/2016  . LVH (left ventricular hypertrophy) 05/26/2016  . Elevated troponin 05/24/2016  . Pain, hand 01/01/2013  . History of lupus nephritis 03/23/2011  . Raynaud's phenomenon 03/23/2011  . HYPOKALEMIA 08/19/2008  . FATTY LIVER DISEASE 05/09/2008  . WEIGHT LOSS 03/28/2008  . RAYNAUDS SYNDROME 02/15/2008  . CARDIAC MURMUR 11/16/2007  . Systemic lupus erythematosus (HCC) 05/25/2006  . GOUT 02/02/2006  . DEPRESSION 02/02/2006  . MIGRAINE HEADACHE 02/02/2006  . Essential hypertension 02/02/2006  . ALLERGIC RHINITIS 02/02/2006  . Asthma 02/02/2006  . GERD 02/02/2006  . IBS 02/02/2006  . OVERACTIVE BLADDER 02/02/2006   PCP:  Avon Gully, MD Pharmacy:   [facility] - [place], [place] - [address] [place] [address] Phone: [phone] Fax: [phone]  [facility] - [address] [place] - [address] [place] [address] Phone: [phone] Fax: [phone]     Social Determinants of Health (SDOH) Interventions    Readmission Risk Interventions No flowsheet data found.

## 2020-02-08 NOTE — Progress Notes (Signed)
Orthopedic Tech Progress Note Patient Details:  Crystal Patterson Apr 03, 1965 [id number]  Ortho Devices Type of Ortho Device: Knee Immobilizer Ortho Device/Splint Location: LLE Ortho Device/Splint Interventions: Ordered,Application   Post Interventions Patient Tolerated: Well Instructions Provided: Care of device   [name] 02/08/2020, 2:02 PM

## 2020-02-08 NOTE — Anesthesia Postprocedure Evaluation (Signed)
Anesthesia Post Note  Patient: Crystal Patterson  Procedure(s) Performed: EXCISIONAL DEBRIDEMENT LEFT KNEE (Left Knee) SKIN GRAFT SPLIT THICKNESS LEFT KNEE (Left Knee)     Patient location during evaluation: PACU Anesthesia Type: General Level of consciousness: awake and alert Pain management: pain level controlled Vital Signs Assessment: post-procedure vital signs reviewed and stable Respiratory status: spontaneous breathing, nonlabored ventilation and respiratory function stable Cardiovascular status: blood pressure returned to baseline and stable Postop Assessment: no apparent nausea or vomiting Anesthetic complications: no   No complications documented.  Last Vitals:  Vitals:   02/08/20 1424 02/08/20 1526  BP: 118/77 115/75  Pulse: 64 62  Resp: 17 17  Temp: 36.6 C 36.4 C  SpO2: 100% 98%    Last Pain:  Vitals:   02/08/20 1654  TempSrc:   PainSc: 7                  Lucretia Kern

## 2020-02-08 NOTE — Plan of Care (Signed)
  Problem: Clinical Measurements: Goal: Postoperative complications will be avoided or minimized Outcome: Progressing   Problem: Skin Integrity: Goal: Demonstration of wound healing without infection will improve Outcome: Progressing   Problem: Health Behavior/Discharge Planning: Goal: Ability to manage health-related needs will improve Outcome: Progressing

## 2020-02-08 NOTE — Interval H&P Note (Signed)
History and Physical Interval Note:  02/08/2020 6:16 AM  Crystal Patterson  has presented today for surgery, with the diagnosis of Infected Left Knee Replacement.  The various methods of treatment have been discussed with the patient and family. After consideration of risks, benefits and other options for treatment, the patient has consented to  Procedure(s): EXCISIONAL DEBRIDEMENT LEFT KNEE (Left) SKIN GRAFT SPLIT THICKNESS LEFT KNEE (Left) as a surgical intervention.  The patient's history has been reviewed, patient examined, no change in status, stable for surgery.  I have reviewed the patient's chart and labs.  Questions were answered to the patient's satisfaction.     Nadara Mustard

## 2020-02-08 NOTE — Plan of Care (Signed)
  Problem: Pain Managment: Goal: General experience of comfort will improve Outcome: Progressing   Problem: Activity: Goal: Risk for activity intolerance will decrease Outcome: Progressing   

## 2020-02-08 NOTE — Anesthesia Procedure Notes (Signed)
Procedure Name: LMA Insertion Date/Time: 02/08/2020 11:40 AM Performed by: Alease Medina, CRNA Pre-anesthesia Checklist: Patient identified, Emergency Drugs available, Suction available and Patient being monitored Patient Re-evaluated:Patient Re-evaluated prior to induction Oxygen Delivery Method: Circle system utilized Preoxygenation: Pre-oxygenation with 100% oxygen Induction Type: IV induction Ventilation: Mask ventilation without difficulty LMA: LMA inserted LMA Size: 4.0 Number of attempts: 1 Placement Confirmation: positive ETCO2,  breath sounds checked- equal and bilateral and CO2 detector Tube secured with: Tape Dental Injury: Teeth and Oropharynx as per pre-operative assessment

## 2020-02-08 NOTE — Op Note (Addendum)
02/08/2020  12:29 PM  PATIENT:  Crystal Patterson    PRE-OPERATIVE DIAGNOSIS:  Infected Left Knee Replacement  POST-OPERATIVE DIAGNOSIS:  Same  PROCEDURE:  EXCISIONAL DEBRIDEMENT LEFT KNEE,  SKIN GRAFT SPLIT THICKNESS LEFT KNEE Local tissue rearrangement for wound closure 20 x 5 cm. Application of cleanse choice wound VAC sponges x2  SURGEON:  Nadara Mustard, MD  PHYSICIAN ASSISTANT:None ANESTHESIA:   General  PREOPERATIVE INDICATIONS:  Crystal Patterson is a  54 y.o. female with a diagnosis of Infected Left Knee Replacement who failed conservative measures and elected for surgical management.    The risks benefits and alternatives were discussed with the patient preoperatively including but not limited to the risks of infection, bleeding, nerve injury, cardiopulmonary complications, the need for revision surgery, among others, and the patient was willing to proceed.  OPERATIVE IMPLANTS: Xenograft Kerecis 4 x 70 cm with cleanse choice Prevena wound VAC sponges x2  @ENCIMAGES @  OPERATIVE FINDINGS: Extensive necrosis of the patella tendon quad tendon and retinaculum as well as ischemic changes over the patella.  Tissue sent for cultures.  OPERATIVE PROCEDURE: Patient brought the operating room and underwent a general anesthetic.  After adequate levels anesthesia were obtained patient's left lower extremity was prepped using DuraPrep draped into a sterile field a timeout was called.  Patient had extensive necrosis of the patella tendon as well as the surface of the patella and quad tendon necrosis.  Patient underwent extensive excisional debridement with excision of skin soft tissue muscle fascia and tendon, using a 21 blade knife as well as scissors and a rondure..  The wound was 20 x 5 cm and extended into the knee joint.  After necrotic tissue was excised the wound was irrigated with normal saline.  The Kerecis xenograft skin graft was applied 4 x 7 cm.  This was then covered by local tissue  rearrangement for wound closure 20 x 5 cm with 2-0 nylon.  The Praveena cleanse choice reticulated foam followed by the solid foam were applied to the wound has had a good suction fit this was overwrapped with Coban patient was extubated taken the PACU in stable condition.  Necrotic tissue sent for cultures.   DISCHARGE PLANNING:  Antibiotic duration: Continue IV antibiotics until cultures are finalized  Weightbearing: Weightbearing as tolerated with a knee immobilizer no range of motion or flexion of the knee.  Pain medication: Opioid pathway  Dressing care/ Wound VAC: Continue wound VAC for 1 week.  Ambulatory devices: Walker  Discharge to: Anticipate patient will need discharge to skilled nursing.  Follow-up: In the office 1 week post operative.

## 2020-02-08 NOTE — Plan of Care (Signed)
  Problem: Clinical Measurements: Goal: Postoperative complications will be avoided or minimized Outcome: Progressing   Problem: Skin Integrity: Goal: Demonstration of wound healing without infection will improve Outcome: Progressing   Problem: Health Behavior/Discharge Planning: Goal: Ability to manage health-related needs will improve Outcome: Progressing   Problem: Clinical Measurements: Goal: Will remain free from infection Outcome: Progressing

## 2020-02-08 NOTE — Progress Notes (Signed)
Physical Therapy Treatment Patient Details Name: Crystal Patterson MRN: [id number] DOB: [birth year] Today's Date: 02/08/2020    History of Present Illness Pt is a 54 y.o. female s/p initial L TKA on 01/04/20, now admitted 01/25/20 with drainage from L knee incision concerning for infection. S/p L knee I&Ds on 11/26, 12/1 and 12/6. Plan for return to OR 12/10 for further debridement of patellar wound and skin graft. PMH includes arthritis, HTN, bilateral finger amputations.   PT Comments    Pt slowly progressing with mobility, limited by increased L knee pain this session. Pt requiring intermittent minA for bed mobility and transfers; opting to sit for ADL task at sink instead of stand due to pain. Continue to educate on gait mechanics and safety with ADL tasks. Session limited by arrival of transport to OR. Will continue to follow acutely.   Follow Up Recommendations  Home health PT;Supervision for mobility/OOB     Equipment Recommendations  None recommended by PT    Recommendations for Other Services       Precautions / Restrictions Precautions Precautions: Knee;Fall Precaution Comments: gentle/"light ROM",  "nothing aggressive" per Ortho order; L knee wound vac Restrictions Weight Bearing Restrictions: Yes LLE Weight Bearing: Weight bearing as tolerated    Mobility  Bed Mobility Overal bed mobility: Needs Assistance Bed Mobility: Supine to Sit;Sit to Supine     Supine to sit: Min assist;HOB elevated Sit to supine: Min assist   General bed mobility comments: MinA for LLE management as pt limited by pain  Transfers Overall transfer level: Needs assistance Equipment used: Rolling walker (2 wheeled) Transfers: Sit to/from Stand Sit to Stand: Min assist;Min guard         General transfer comment: Increased time and effort preparing to stand, use of UE to flex L knee into position, initial minA for trunk elevation standing from EOB to RW; stood 2x from [facility] at sink with min  guard  Ambulation/Gait Ambulation/Gait assistance: Min Emergency planning/management officer (Feet): 16 Feet Assistive device: Rolling walker (2 wheeled) Gait Pattern/deviations: Antalgic;Trunk flexed;Decreased weight shift to left;Step-to pattern Gait velocity: Decreased   General Gait Details: Slow, antalgic gait with RW and intermittent min guard for balance; pt walking on L toe due to significant knee pain with full extension and WB; further mobility limited by arrival of transport to OR   Stairs             Wheelchair Mobility    Modified Rankin (Stroke Patients Only)       Balance Overall balance assessment: Needs assistance Sitting-balance support: Feet supported Sitting balance-Leahy Scale: Fair       Standing balance-Leahy Scale: Poor Standing balance comment: Reliant on UE support                            Cognition Arousal/Alertness: Awake/alert Behavior During Therapy: WFL for tasks assessed/performed;Flat affect Overall Cognitive Status: Within Functional Limits for tasks assessed                                 General Comments: WFL for simple tasks, but not formally assessed. Flat affect, but interacting and laughing appropriately; pleasant and cooperative      Exercises      General Comments General comments (skin integrity, edema, etc.): Unable to maintain standing at sink to brush teeth due to pain, requiring seated rest on BSC while performing ADL  task      Pertinent Vitals/Pain Pain Assessment: Faces Faces Pain Scale: Hurts even more Pain Location: L knee Pain Descriptors / Indicators: Discomfort;Grimacing;Guarding Pain Intervention(s): Monitored during session;Limited activity within patient's tolerance    Home Living                      Prior Function            PT Goals (current goals can now be found in the care plan section) Progress towards PT goals: Progressing toward goals (slowly, limited by  pain)    Frequency    Min 5X/week      PT Plan Current plan remains appropriate    Co-evaluation              AM-PAC PT "6 Clicks" Mobility   Outcome Measure  Help needed turning from your back to your side while in a flat bed without using bedrails?: A Little Help needed moving from lying on your back to sitting on the side of a flat bed without using bedrails?: A Little Help needed moving to and from a bed to a chair (including a wheelchair)?: A Little Help needed standing up from a chair using your arms (e.g., wheelchair or bedside chair)?: A Little Help needed to walk in hospital room?: A Little Help needed climbing 3-5 steps with a railing? : A Lot 6 Click Score: 17    End of Session   Activity Tolerance: Patient limited by pain Patient left: in bed (with transport to OR) Nurse Communication: Mobility status PT Visit Diagnosis: Muscle weakness (generalized) (M62.81);Difficulty in walking, not elsewhere classified (R26.2)     Time: 7619-5093 PT Time Calculation (min) (ACUTE ONLY): 21 min  Charges:  $Therapeutic Activity: 8-22 mins                     Ina Homes, PT, DPT Acute Rehabilitation Services  Pager [phone] Office [phone]  Malachy Chamber 02/08/2020, 10:33 AM

## 2020-02-09 LAB — BASIC METABOLIC PANEL
Anion gap: 6 (ref 5–15)
BUN: 8 mg/dL (ref 6–20)
CO2: 27 mmol/L (ref 22–32)
Calcium: 8.6 mg/dL — ABNORMAL LOW (ref 8.9–10.3)
Chloride: 99 mmol/L (ref 98–111)
Creatinine, Ser: 0.68 mg/dL (ref 0.44–1.00)
GFR, Estimated: >60 mL/min (ref >60)
Glucose, Bld: 102 mg/dL — ABNORMAL HIGH (ref 70–99)
Potassium: 4.5 mmol/L (ref 3.5–5.1)
Sodium: 132 mmol/L — ABNORMAL LOW (ref 135–145)

## 2020-02-09 NOTE — Progress Notes (Signed)
Pt's daughter, Sebastian Ache, would liked to be called with updates on pt's progress. [phone]. Pt consents to daughter being informed of medical updates.  Aida Raider, PT  Office # [phone] Pager [phone]

## 2020-02-09 NOTE — Evaluation (Signed)
Occupational Therapy Evaluation Patient Details Name: Crystal Patterson MRN: [id number] DOB: December 26, 1965 Today's Date: 02/09/2020    History of Present Illness Pt is a 54 y.o. female s/p initial L TKA on 01/04/20, now admitted 01/25/20 with drainage from L knee incision concerning for infection. S/p L knee I&Ds on 11/26, 12/1 and 12/6. Returned to OR 12/10 for further debridement of patellar wound and skin graft. PMH includes arthritis, HTN, bilateral finger amputations.   Clinical Impression   Patient admitted with the above diagnosis and procedure.  PTA she was Mod I for mobility at a RW level, and was able to complete her own self care, toilet herself and could preform light meal prep.  Her family would assist with community mobility and home management.  Deficits impacting independence are listed below.  Currently she needs Min A for mobility and up to Mod A for lower body ADL.  OT will follow in the acute setting, SNF is recommended unless her family can provide 24 hour min to mod A to meet her needs.     Follow Up Recommendations  SNF    Equipment Recommendations  3 in 1 bedside commode    Recommendations for Other Services       Precautions / Restrictions Precautions Precautions: Fall;Other (comment) Precaution Comments: strict no ROM L knee. L knee wound vac Required Braces or Orthoses: Knee Immobilizer - Left Knee Immobilizer - Left: On at all times Restrictions LLE Weight Bearing: Weight bearing as tolerated      Mobility Bed Mobility Overal bed mobility: Needs Assistance Bed Mobility: Sit to Supine     Supine to sit: Min assist;HOB elevated Sit to supine: Min assist   General bed mobility comments: +rail, increased time, assist with LLE    Transfers Overall transfer level: Needs assistance Equipment used: Rolling walker (2 wheeled) Transfers: Sit to/from Stand Sit to Stand: Min assist Stand pivot transfers: Min assist       General transfer comment: cues for  hand placement, assist to power up    Balance Overall balance assessment: Needs assistance Sitting-balance support: Feet supported;No upper extremity supported Sitting balance-Leahy Scale: Fair     Standing balance support: Bilateral upper extremity supported;During functional activity Standing balance-Leahy Scale: Poor Standing balance comment: Reliant on UE support                           ADL either performed or assessed with clinical judgement   ADL Overall ADL's : Needs assistance/impaired Eating/Feeding: Independent;Sitting   Grooming: Wash/dry hands;Wash/dry face;Oral care;Set up;Sitting   Upper Body Bathing: Min guard;Sitting   Lower Body Bathing: Moderate assistance;Sit to/from stand   Upper Body Dressing : Set up;Sitting   Lower Body Dressing: Moderate assistance;Sit to/from stand   Toilet Transfer: Minimal assistance;BSC;Stand-pivot           Functional mobility during ADLs: Minimal assistance       Vision Patient Visual Report: No change from baseline       Perception     Praxis      Pertinent Vitals/Pain Pain Assessment: Faces Faces Pain Scale: Hurts little more Pain Location: L knee Pain Descriptors / Indicators: Discomfort;Grimacing;Guarding Pain Intervention(s): Monitored during session     Hand Dominance Right   Extremity/Trunk Assessment Upper Extremity Assessment Upper Extremity Assessment: Overall WFL for tasks assessed   Lower Extremity Assessment Lower Extremity Assessment: Defer to PT evaluation   Cervical / Trunk Assessment Cervical / Trunk Assessment: Normal  Communication Communication Communication: No difficulties   Cognition Arousal/Alertness: Awake/alert Behavior During Therapy: WFL for tasks assessed/performed Overall Cognitive Status: Within Functional Limits for tasks assessed                                 General Comments: increased processing time noted.  patient will repeat  instructions given.   General Comments       Exercises     Shoulder Instructions      Home Living Family/patient expects to be discharged to:: Private residence Living Arrangements: Alone Available Help at Discharge: Family Type of Home: House Home Access: Stairs to enter   Entrance Stairs-Rails: None Home Layout: One level     Bathroom Shower/Tub: Chief Strategy Officer: Standard Bathroom Accessibility: Yes How Accessible: Accessible via walker Home Equipment: Walker - 2 wheels;Walker - 4 wheels;Shower seat;Toilet riser;Cane - single point   Additional Comments: Pt has been staying with sister since TKR      Prior Functioning/Environment Level of Independence: Independent with assistive device(s)  Gait / Transfers Assistance Needed: pt using RW since TKR ADL's / Homemaking Assistance Needed: reports independence with ADL's.  Family will assist with community mobility, meals and home management since TKR.            OT Problem List: Decreased strength;Decreased activity tolerance;Impaired balance (sitting and/or standing);Decreased knowledge of use of DME or AE;Pain      OT Treatment/Interventions: Self-care/ADL training;Therapeutic exercise;DME and/or AE instruction;Balance training;Patient/family education;Therapeutic activities    OT Goals(Current goals can be found in the care plan section) Acute Rehab OT Goals Patient Stated Goal: I'd like to go home. OT Goal Formulation: With patient Time For Goal Achievement: 02/23/20 Potential to Achieve Goals: Fair ADL Goals Pt Will Perform Grooming: with supervision;standing Pt Will Perform Lower Body Bathing: with supervision;sit to/from stand Pt Will Perform Lower Body Dressing: with supervision;sit to/from stand Pt Will Transfer to Toilet: with supervision;ambulating;regular height toilet Pt Will Perform Toileting - Clothing Manipulation and hygiene: Independently;sitting/lateral leans  OT Frequency: Min  2X/week   Barriers to D/C:    none noted       Co-evaluation              AM-PAC OT "6 Clicks" Daily Activity     Outcome Measure Help from another person eating meals?: None Help from another person taking care of personal grooming?: None Help from another person toileting, which includes using toliet, bedpan, or urinal?: A Little Help from another person bathing (including washing, rinsing, drying)?: A Lot Help from another person to put on and taking off regular upper body clothing?: A Little Help from another person to put on and taking off regular lower body clothing?: A Lot 6 Click Score: 18   End of Session Equipment Utilized During Treatment: Rolling walker;Gait belt Nurse Communication: Mobility status  Activity Tolerance: Patient tolerated treatment well Patient left: in bed;with call bell/phone within reach;with bed alarm set  OT Visit Diagnosis: Unsteadiness on feet (R26.81);Muscle weakness (generalized) (M62.81);Pain Pain - Right/Left: Left Pain - part of body: Leg                Time: 1230-1249 OT Time Calculation (min): 19 min Charges:  OT General Charges $OT Visit: 1 Visit OT Evaluation $OT Eval Moderate Complexity: 1 Mod  02/09/2020  Rich, OTR/L  Acute Rehabilitation Services  Office:  [phone]   Suzanna Obey 02/09/2020, 3:21 PM

## 2020-02-09 NOTE — Plan of Care (Signed)

## 2020-02-09 NOTE — Progress Notes (Signed)
Patient ID: Crystal Patterson, female   DOB: 06/28/1965, 54 y.o.   MRN: [id number] Patient is postoperative day 1 debridement of necrotic patella tendon as well as necrotic soft tissue around the total knee arthroplasty.Kerecic xenograft skin graft was applied and the wound VAC there was 50 cc in the wound VAC canister there are 2 checks.  Cultures are pending however initial cultures show good sensitivity to Kefzol.  We will continue IV Kefzol.  With gait training patient should use the knee immobilizer strict no flexion of the left knee.  Patient most likely will need a PICC line with 6 weeks of IV antibiotics.

## 2020-02-09 NOTE — Progress Notes (Signed)
Physical Therapy Treatment Patient Details Name: Crystal Patterson MRN: [id number] DOB: [birth year] Today's Date: 02/09/2020    History of Present Illness Pt is a 54 y.o. female s/p initial L TKA on 01/04/20, now admitted 01/25/20 with drainage from L knee incision concerning for infection. S/p L knee I&Ds on 11/26, 12/1 and 12/6. Returned to OR 12/10 for further debridement of patellar wound and skin graft. PMH includes arthritis, HTN, bilateral finger amputations.    PT Comments    Pt seen for PT following return to OR yesterday. She is now WBAT and strict no ROM L knee, knee immobilizer at all times. She required min assist supine to sit, min assist sit to stand, and min guard assist ambulation 25' with RW. Gait distance limited by pain. Continue to recommend HHPT at discharge. Will need to consider SNF if pt has no family support at home.    Follow Up Recommendations  Home health PT;Supervision for mobility/OOB     Equipment Recommendations  None recommended by PT    Recommendations for Other Services       Precautions / Restrictions Precautions Precautions: Fall;Other (comment) Precaution Comments: strict no ROM L knee. L knee wound vac Required Braces or Orthoses: Knee Immobilizer - Left Knee Immobilizer - Left: On at all times Restrictions LLE Weight Bearing: Weight bearing as tolerated    Mobility  Bed Mobility Overal bed mobility: Needs Assistance Bed Mobility: Supine to Sit     Supine to sit: Min assist;HOB elevated     General bed mobility comments: +rail, increased time, assist with LLE  Transfers Overall transfer level: Needs assistance Equipment used: Rolling walker (2 wheeled) Transfers: Sit to/from Stand Sit to Stand: Min assist         General transfer comment: cues for hand placement, assist to power up  Ambulation/Gait Ambulation/Gait assistance: Min guard Gait Distance (Feet): 25 Feet Assistive device: Rolling walker (2 wheeled) Gait  Pattern/deviations: Antalgic;Step-to pattern;Decreased weight shift to left Gait velocity: Decreased Gait velocity interpretation: <1.8 ft/sec, indicate of risk for recurrent falls General Gait Details: Cues for RW management as pt tends to step too close to front of RW. Distance limited by pain.   Stairs             Wheelchair Mobility    Modified Rankin (Stroke Patients Only)       Balance Overall balance assessment: Needs assistance Sitting-balance support: Feet supported;No upper extremity supported Sitting balance-Leahy Scale: Fair     Standing balance support: Bilateral upper extremity supported;During functional activity Standing balance-Leahy Scale: Poor Standing balance comment: Reliant on UE support                            Cognition Arousal/Alertness: Awake/alert Behavior During Therapy: WFL for tasks assessed/performed Overall Cognitive Status: Within Functional Limits for tasks assessed                                        Exercises      General Comments        Pertinent Vitals/Pain Pain Assessment: Faces Faces Pain Scale: Hurts whole lot Pain Location: L knee Pain Descriptors / Indicators: Discomfort;Grimacing;Guarding Pain Intervention(s): Monitored during session;Repositioned;Patient requesting pain meds-RN notified    Home Living  Prior Function            PT Goals (current goals can now be found in the care plan section) Acute Rehab PT Goals Patient Stated Goal: decrease pain Progress towards PT goals: Progressing toward goals    Frequency    Min 5X/week      PT Plan Current plan remains appropriate    Co-evaluation              AM-PAC PT "6 Clicks" Mobility   Outcome Measure  Help needed turning from your back to your side while in a flat bed without using bedrails?: A Little Help needed moving from lying on your back to sitting on the side of a flat bed  without using bedrails?: A Little Help needed moving to and from a bed to a chair (including a wheelchair)?: A Little Help needed standing up from a chair using your arms (e.g., wheelchair or bedside chair)?: A Little Help needed to walk in hospital room?: A Little Help needed climbing 3-5 steps with a railing? : A Lot 6 Click Score: 17    End of Session Equipment Utilized During Treatment: Gait belt Activity Tolerance: Patient tolerated treatment well Patient left: in chair;with call bell/phone within reach;with family/visitor present Nurse Communication: Mobility status;Patient requests pain meds PT Visit Diagnosis: Muscle weakness (generalized) (M62.81);Difficulty in walking, not elsewhere classified (R26.2)     Time: 3845-3646 PT Time Calculation (min) (ACUTE ONLY): 35 min  Charges:  $Gait Training: 23-37 mins                     Aida Raider, [place]  Office # [phone] Pager [phone]    Ilda Foil 02/09/2020, 1:00 PM

## 2020-02-10 NOTE — Plan of Care (Addendum)
Pt got up and transferred from bed to chair today. She sat up in chair for several hours. She transferred really well using her front wheel walker. Only complaint was pain today. Will continue to monitor.   Problem: Activity: Goal: Risk for activity intolerance will decrease Outcome: Progressing   Problem: Nutrition: Goal: Adequate nutrition will be maintained Outcome: Progressing   Problem: Coping: Goal: Level of anxiety will decrease Outcome: Progressing   Problem: Pain Managment: Goal: General experience of comfort will improve Outcome: Progressing   Problem: Safety: Goal: Ability to remain free from injury will improve Outcome: Progressing

## 2020-02-10 NOTE — Progress Notes (Signed)
Patient ID: Crystal Patterson, female   DOB: [birth year], 54 y.o.   MRN: [id number] I explained in detail to the patient what is going on with her left knee.  She has a graft over her left knee incision where there was significant wound breakdown.  There is a VAC on this.  This will need to stay on the remainder of the week and she will be discharged with a home VAC.  I explained in detail the patient that she may need short-term skilled nursing placement.  She will need to be on IV antibiotics every hour hours for least 4 more weeks.  She does have a Port-A-Cath which she is getting her IV antibiotics through.  She would prefer to go home with her family taking care of her as well as home health nursing.  I will reach out to family to get their opinion about this.  If it ends up that she needs short-term skilled nursing, we will need to have a PICC line placed because unfortunately skilled nursing facilities do not take care of Port-A-Cath.  Hopefully will have a disposition by tomorrow.

## 2020-02-11 ENCOUNTER — Encounter (HOSPITAL_COMMUNITY): Payer: Self-pay | Admitting: Orthopedic Surgery

## 2020-02-11 NOTE — Plan of Care (Signed)
  Problem: Clinical Measurements: Goal: Ability to maintain clinical measurements within normal limits will improve Outcome: Progressing Goal: Postoperative complications will be avoided or minimized Outcome: Progressing   Problem: Skin Integrity: Goal: Demonstration of wound healing without infection will improve Outcome: Progressing   Problem: Health Behavior/Discharge Planning: Goal: Ability to manage health-related needs will improve Outcome: Progressing   Problem: Clinical Measurements: Goal: Ability to maintain clinical measurements within normal limits will improve Outcome: Progressing Goal: Will remain free from infection Outcome: Progressing Goal: Diagnostic test results will improve Outcome: Progressing Goal: Cardiovascular complication will be avoided Outcome: Progressing   Problem: Activity: Goal: Risk for activity intolerance will decrease Outcome: Progressing   Problem: Nutrition: Goal: Adequate nutrition will be maintained Outcome: Progressing   Problem: Coping: Goal: Level of anxiety will decrease Outcome: Progressing   Problem: Elimination: Goal: Will not experience complications related to bowel motility Outcome: Progressing   Problem: Pain Managment: Goal: General experience of comfort will improve Outcome: Progressing   Problem: Safety: Goal: Ability to remain free from injury will improve Outcome: Progressing   Problem: Skin Integrity: Goal: Risk for impaired skin integrity will decrease Outcome: Progressing   

## 2020-02-11 NOTE — Progress Notes (Signed)
Physical Therapy Treatment Patient Details Name: Crystal Patterson MRN: [id number] DOB: 09/02/1965 Today's Date: 02/11/2020    History of Present Illness Pt is a 54 y.o. female s/p initial L TKA on 01/04/20, now admitted 01/25/20 with drainage from L knee incision concerning for infection. S/p L knee I&Ds on 11/26, 12/1 and 12/6. Returned to OR 12/10 for further debridement of patellar wound and skin graft. PMH includes arthritis, HTN, bilateral finger amputations.    PT Comments    Pt was seen for mobility and ROM to RLE with LLE protected in immobilizer.  Following pt with assistance to turn walker and to lower to recliner to sit was essential, but is expecting to have help at home to make transition there.  Follow up with goals of PT for acute care, focusing on control of gait with walker and to increase RLE Strength to make her control of mobility safer.   Follow Up Recommendations  Home health PT;Supervision for mobility/OOB     Equipment Recommendations  None recommended by PT    Recommendations for Other Services       Precautions / Restrictions Precautions Precautions: Fall Precaution Comments: strict no ROM L knee. L knee wound vac Required Braces or Orthoses: Knee Immobilizer - Left Knee Immobilizer - Left: On at all times Restrictions LLE Weight Bearing: Weight bearing as tolerated    Mobility  Bed Mobility Overal bed mobility: Needs Assistance Bed Mobility: Supine to Sit     Supine to sit: Min assist     General bed mobility comments: scooting to EOB with min guard  Transfers Overall transfer level: Needs assistance Equipment used: Rolling walker (2 wheeled) Transfers: Sit to/from Stand Sit to Stand: Min assist         General transfer comment: cued sequence and safety with transfers including placement of LLE  Ambulation/Gait Ambulation/Gait assistance: Min guard Gait Distance (Feet): 40 Feet Assistive device: Rolling walker (2 wheeled) Gait  Pattern/deviations: Step-to pattern;Wide base of support Gait velocity: Decreased   General Gait Details: pt is cued for safety and use of walker requres help to pivot and turn   Stairs             Wheelchair Mobility    Modified Rankin (Stroke Patients Only)       Balance Overall balance assessment: Needs assistance Sitting-balance support: Feet supported Sitting balance-Leahy Scale: Fair     Standing balance support: Bilateral upper extremity supported;During functional activity Standing balance-Leahy Scale: Poor                              Cognition Arousal/Alertness: Awake/alert Behavior During Therapy: WFL for tasks assessed/performed Overall Cognitive Status: Within Functional Limits for tasks assessed                                        Exercises Total Joint Exercises Ankle Circles/Pumps: AROM;5 reps Heel Slides: Strengthening;Right Hip ABduction/ADduction: AAROM;AROM;10 reps    General Comments General comments (skin integrity, edema, etc.): assistance to control sitting to the recliner and to increase her control of turns      Pertinent Vitals/Pain Pain Assessment: Faces Faces Pain Scale: Hurts little more Pain Location: L knee Pain Descriptors / Indicators: Guarding;Grimacing Pain Intervention(s): Limited activity within patient's tolerance;Monitored during session;Ice applied;Repositioned    Home Living  Prior Function            PT Goals (current goals can now be found in the care plan section) Acute Rehab PT Goals Patient Stated Goal: I'd like to go home. Progress towards PT goals: Progressing toward goals    Frequency    Min 5X/week      PT Plan Current plan remains appropriate    Co-evaluation              AM-PAC PT "6 Clicks" Mobility   Outcome Measure  Help needed turning from your back to your side while in a flat bed without using bedrails?: A  Little Help needed moving from lying on your back to sitting on the side of a flat bed without using bedrails?: A Little Help needed moving to and from a bed to a chair (including a wheelchair)?: A Little Help needed standing up from a chair using your arms (e.g., wheelchair or bedside chair)?: A Little Help needed to walk in hospital room?: A Little Help needed climbing 3-5 steps with a railing? : A Lot 6 Click Score: 17    End of Session Equipment Utilized During Treatment: Gait belt Activity Tolerance: Patient tolerated treatment well Patient left: in chair;with call bell/phone within reach;with family/visitor present Nurse Communication: Mobility status;Patient requests pain meds PT Visit Diagnosis: Muscle weakness (generalized) (M62.81);Difficulty in walking, not elsewhere classified (R26.2)     Time: 5465-0354 PT Time Calculation (min) (ACUTE ONLY): 28 min  Charges:  $Gait Training: 8-22 mins $Therapeutic Activity: 8-22 mins               Ivar Drape 02/11/2020, 8:28 PM  Samul Dada, PT MS Acute Rehab Dept. Number: [facility] [id number] and [facility] [phone]

## 2020-02-11 NOTE — Progress Notes (Signed)
PHARMACY CONSULT NOTE FOR:  OUTPATIENT  PARENTERAL ANTIBIOTIC THERAPY (OPAT)  Indication: PJI of left knee Regimen: Cefazolin 2 gm IV Q 8 hours End date: 03/12/2020  IV antibiotic discharge orders are pended. To discharging provider:  please sign these orders via discharge navigator,  Select New Orders & click on the button choice - Manage This Unsigned Work.     Thank you for allowing pharmacy to be a part of this patient's care.  Alphia Moh, PharmD, BCPS, BCCP Clinical Pharmacist  Please check AMION for all [facility] Pharmacy phone numbers After 10:00 PM, call Main Pharmacy [phone]

## 2020-02-11 NOTE — Progress Notes (Signed)
Patient ID: Crystal Patterson, female   DOB: 1965-06-19, 54 y.o.   MRN: [id number] The patient is very upbeat this morning. She encouraged about the possibility of being able to go home tomorrow. The hold-up will be setting up for for home IV therapies with IV antibiotics for the next 4 weeks. She has a VAC on her left knee they can actually stay on without needing to be changed until a follow-up appointment with Dr. Lajoyce Corners next Monday. The transitional care team and our case manager are helping with trying to get things set up for her discharge hopefully tomorrow.

## 2020-02-11 NOTE — Progress Notes (Cosign Needed)
    Durable Medical Equipment  (From admission, onward)         Start     Ordered   02/11/20 1016  For home use only DME Hospital bed  Once       Question Answer Comment  Length of Need 6 Months   Patient has (list medical condition): Infected L total knee replacement   The above medical condition requires: Patient requires the ability to reposition frequently   Head must be elevated greater than: 30 degrees   Bed type Semi-electric   Support Surface: Gel Overlay      02/11/20 1018

## 2020-02-11 NOTE — Plan of Care (Signed)

## 2020-02-11 NOTE — TOC Progression Note (Addendum)
Transition of Care [facility]) - Progression Note    Patient Details  Name: Crystal Patterson MRN: [id number] Date of Birth: 03-05-1965  Transition of [facility] CM/SW Contact  Epifanio Lesches, RN Phone Number: 02/11/2020, 1:09 PM  Clinical Narrative:        - Left total knee joint infection NCM noted pt will need IV ABX therapy x 4 weeks. Pt agreeable to home health services. Declines SNF placement. Pt without preference to [facility] Referral made with [facility] for home health services and accepted. Referral made with Pam with [facility] to assist with home ABX therapy ... teaching with daughter needs completing prior to d/c. Pt with porta cath., per Pam they can use porta cath for ABX  If agreeable with MD.  Referral made with [facility] / Velna Hatchet  for DME hospital bed. Pt will d/c to daughter's residence : [address] [place], [place].  TOC team will continue to monitor for needs...  Expected Discharge Plan: Home w Home Health Services Barriers to Discharge: Continued Medical Work up  Expected Discharge Plan and Services Expected Discharge Plan: Home w Home Health Services   Discharge Planning Services: CM Consult   Living arrangements for the past 2 months: Single Family Home                 DME Arranged: IV pump/equipment,Hospital bed (Referral made with [facility] for hospital bed) DME Agency: Other - Comment ([facility]/ Pam) Date DME Agency Contacted: 02/11/20 Time DME Agency Contacted: 1257 Representative spoke with at DME Agency: Pam HH Arranged: RN,PT   Date HH Agency Contacted: 02/11/20 Time HH Agency Contacted: 1301 Representative spoke with at [facility] Agency: [place]   Social Determinants of Health (SDOH) Interventions    Readmission Risk Interventions No flowsheet data found.

## 2020-02-11 NOTE — Progress Notes (Signed)
Patient is postop day 3 status post irrigation debridement of her knee.  She is lying in bed fairly comfortable although is requesting some pain medication.  She states she has been wearing her knee immobilizer when she is out of bed and is conscious of not bending her knee  Knee immobilizer in place 60 cc out of VAC VAC is functioning well with 2 green checks.   Plan patient would prefer to go home but may need to go to short-term skilled nursing.  Will need 6 weeks of IV Kefzol per Dr. Lajoyce Corners

## 2020-02-11 NOTE — Care Plan (Signed)
Received request from Dr. Magnus Ivan this morning to check on discharge plan for patient. [facility] office RNCM into room to see patient and discuss. Spoke with her daughter, who states she is capable of assisting at home. Her mother can come to her home, where there is a bedroom on the first floor with bathroom that will be accessible. She is comfortable with education of IV antibiotic needs. She requested hospital bed is only other DME they will need, as her mother already has a FWW and 3in1/BSC. Discussed HH skilled nursing and PT will be needed to help with IV antibiotics needed for another 4 weeks. Current wound vac can be changed to a Provena home vac, which could stay on until 12/20 in office. Spoke with floor RNCM, Gae Gallop. She has reached out to [facility] agencies for Skilled nursing and PT. [facility] to accept patient at this time. Waiting to hear back from [facility] and [facility]. MD has ordered OP antibiotic consult for the home antibiotics and hospital bed ordered as well. Goal is for discharge possibly tomorrow, if all DME and home health can be arranged. Please call Ralph Dowdy, [facility] with any questions or needs. [phone].

## 2020-02-12 DIAGNOSIS — T8454XA Infection and inflammatory reaction due to internal left knee prosthesis, initial encounter: Secondary | ICD-10-CM | POA: Diagnosis not present

## 2020-02-12 MED ORDER — OXYCODONE HCL 10 MG PO TABS: 10.0000 mg | ORAL_TABLET | ORAL | 0 refills | Status: DC | PRN

## 2020-02-12 MED ORDER — HEPARIN SOD (PORK) LOCK FLUSH 100 UNIT/ML IV SOLN
250.0000 [IU] | INTRAVENOUS | Status: DC | PRN
  Filled 2020-02-12: qty 2.5

## 2020-02-12 MED ORDER — HEPARIN SOD (PORK) LOCK FLUSH 100 UNIT/ML IV SOLN
500.0000 [IU] | INTRAVENOUS | Status: AC | PRN
  Administered 2020-02-12: 500 [IU]
  Filled 2020-02-12: qty 5

## 2020-02-12 MED ORDER — CEFAZOLIN IV (FOR PTA / DISCHARGE USE ONLY): 2.0000 g | Freq: Three times a day (TID) | INTRAVENOUS | 0 refills | Status: AC

## 2020-02-12 NOTE — Progress Notes (Signed)
Status post irrigation and debridement of her left knee.  Wound VAC is functioning well with two green checks.  No further drainage.   Vital signs stable alert pleasant to exam very comfortable.  She plans on discharging today I have plugged into the Prevena pump so it may charge in the room and she can be transitioned to this prior to discharge

## 2020-02-12 NOTE — Discharge Instructions (Signed)
Can WBAT on left LE. No flexing left knee at all.

## 2020-02-12 NOTE — Progress Notes (Signed)
Patient ID: Crystal Patterson, female   DOB: December 09, 1965, 54 y.o.   MRN: [id number] The plan is to discharge the patient home today on IV antibiotics and wound VAC.  Will see her in the office next Monday 12/20.

## 2020-02-12 NOTE — Plan of Care (Signed)
  Problem: Pain Managment: Goal: General experience of comfort will improve Outcome: Progressing   Problem: Safety: Goal: Ability to remain free from injury will improve Outcome: Progressing   Problem: Skin Integrity: Goal: Risk for impaired skin integrity will decrease Outcome: Progressing   

## 2020-02-12 NOTE — Progress Notes (Signed)
The patient is ready for discharge.

## 2020-02-12 NOTE — Plan of Care (Signed)
  Problem: Clinical Measurements: Goal: Ability to maintain clinical measurements within normal limits will improve Outcome: Completed/Met Goal: Postoperative complications will be avoided or minimized Outcome: Completed/Met   Problem: Skin Integrity: Goal: Demonstration of wound healing without infection will improve Outcome: Completed/Met   Problem: Health Behavior/Discharge Planning: Goal: Ability to manage health-related needs will improve Outcome: Completed/Met   Problem: Clinical Measurements: Goal: Ability to maintain clinical measurements within normal limits will improve Outcome: Completed/Met Goal: Will remain free from infection Outcome: Completed/Met Goal: Diagnostic test results will improve Outcome: Completed/Met Goal: Cardiovascular complication will be avoided Outcome: Completed/Met   Problem: Activity: Goal: Risk for activity intolerance will decrease Outcome: Completed/Met   Problem: Nutrition: Goal: Adequate nutrition will be maintained Outcome: Completed/Met   Problem: Coping: Goal: Level of anxiety will decrease Outcome: Completed/Met   Problem: Elimination: Goal: Will not experience complications related to bowel motility Outcome: Completed/Met   Problem: Pain Managment: Goal: General experience of comfort will improve Outcome: Completed/Met   Problem: Safety: Goal: Ability to remain free from injury will improve Outcome: Completed/Met   Problem: Skin Integrity: Goal: Risk for impaired skin integrity will decrease Outcome: Completed/Met

## 2020-02-12 NOTE — Discharge Summary (Signed)
Patient ID: Crystal Patterson MRN: [id number] DOB/AGE: 1965/07/14 54 y.o.  Admit date: 01/25/2020 Discharge date: 02/12/2020  Admission Diagnoses:  Principal Problem:   Infection of total left knee replacement ([place]) Active Problems:   Open knee wound, left, sequela   Discharge Diagnoses:  Same  Past Medical History:  Diagnosis Date  . Anginal pain ([place]) 2020  . Arthritis    Knees, hips,  . Elevated troponin 05/24/2016  . Finger amputation, no complication   . Heart murmur   . Hypercholesteremia   . Hypertension   . Infection of total left knee replacement ([place]) 01/25/2020  . Lupus ([place])     Surgeries: Procedure(s): EXCISIONAL DEBRIDEMENT LEFT KNEE SKIN GRAFT SPLIT THICKNESS LEFT KNEE on 02/08/2020   Consultants: Treatment Team:  Newt Minion, MD  Discharged Condition: Improved  Hospital Course: Crystal Patterson is an 54 y.o. female who was admitted 01/25/2020 for operative treatment ofInfection of total left knee replacement ([place]). Patient has severe unremitting pain that affects sleep, daily activities, and work/hobbies. After pre-op clearance the patient was taken to the operating room on 02/08/2020 and underwent  Procedure(s): EXCISIONAL DEBRIDEMENT LEFT KNEE SKIN GRAFT SPLIT THICKNESS LEFT KNEE.    Patient was given perioperative antibiotics:  Anti-infectives (From admission, onward)   Start     Dose/Rate Route Frequency Ordered Stop   02/12/20 0000  ceFAZolin (ANCEF) IVPB        2 g Intravenous Every 8 hours 02/12/20 0748 03/25/20 2359   02/08/20 1115  ceFAZolin (ANCEF) IVPB 2g/100 mL premix        2 g 200 mL/hr over 30 Minutes Intravenous To Surgery 02/08/20 1016 02/08/20 1133   01/30/20 1841  vancomycin (VANCOCIN) powder  Status:  Discontinued          As needed 01/30/20 1841 01/30/20 2115   01/28/20 1800  cefTRIAXone (ROCEPHIN) 2 g in sodium chloride 0.9 % 100 mL IVPB  Status:  Discontinued        2 g 200 mL/hr over 30 Minutes Intravenous Every 24 hours  01/28/20 1021 01/28/20 1224   01/28/20 1800  ceFAZolin (ANCEF) IVPB 2g/100 mL premix        2 g 200 mL/hr over 30 Minutes Intravenous Every 8 hours 01/28/20 1224     01/26/20 0200  piperacillin-tazobactam (ZOSYN) IVPB 3.375 g  Status:  Discontinued        3.375 g 12.5 mL/hr over 240 Minutes Intravenous Every 8 hours 01/25/20 1808 01/28/20 1236   01/25/20 2100  vancomycin (VANCOREADY) IVPB 750 mg/150 mL  Status:  Discontinued        750 mg 150 mL/hr over 60 Minutes Intravenous Every 12 hours 01/25/20 1216 01/27/20 1125   01/25/20 1445  piperacillin-tazobactam (ZOSYN) IVPB 3.375 g  Status:  Discontinued        3.375 g 12.5 mL/hr over 240 Minutes Intravenous Every 8 hours 01/25/20 1353 01/25/20 1808   01/25/20 1000  vancomycin (VANCOCIN) 1,000 mg in sodium chloride 0.9 % 250 mL IVPB  Status:  Discontinued        1,000 mg 250 mL/hr over 60 Minutes Intravenous Every 12 hours 01/25/20 0850 01/25/20 0910   01/25/20 0915  vancomycin (VANCOCIN) IVPB 1000 mg/200 mL premix        1,000 mg 200 mL/hr over 60 Minutes Intravenous  Once 01/25/20 0910 01/25/20 0909   01/25/20 0851  vancomycin (VANCOCIN) 1-5 GM/200ML-% IVPB       Note to Pharmacy: Eliberto Ivory   : cabinet  override      01/25/20 0851 01/25/20 0919       Patient was given sequential compression devices, early ambulation, and chemoprophylaxis to prevent DVT.  Patient benefited maximally from hospital stay and there were no complications.  She had a prolonged hospitalization in an effort to salvage her left total knee arthroplasty.  The infectious disease service was consulted as well.  Her knee cultures eventually grew out E. coli and Proteus.  She is on appropriate antibiotics for this for a total of 6 weeks of IV antibiotics.  She had multiple procedures on her left knee during this hospitalization in an effort to get the infection under control and again, in an attempt to salvage the total knee arthroplasty.  Recent vital signs:   Patient Vitals for the past 24 hrs:  BP Temp Temp src Pulse Resp SpO2  02/12/20 0350 (!) 88/55 97.7 F (36.5 C) -- 63 17 100 %  02/11/20 1951 (!) 87/76 98.3 F (36.8 C) -- 67 17 --  02/11/20 1700 94/70 98.5 F (36.9 C) Oral 70 -- --  02/11/20 1100 90/62 -- -- -- -- --  02/11/20 0952 90/62 98.8 F (37.1 C) Oral 61 16 100 %     Recent laboratory studies: No results for input(s): WBC, HGB, HCT, PLT, NA, K, CL, CO2, BUN, CREATININE, GLUCOSE, INR, CALCIUM in the last 72 hours.  Invalid input(s): PT, 2   Discharge Medications:   Allergies as of 02/12/2020   No Known Allergies     Medication List    STOP taking these medications   doxycycline 100 MG tablet Commonly known as: VIBRA-TABS   mupirocin ointment 2 % Commonly known as: BACTROBAN     TAKE these medications   allopurinol 100 MG tablet Commonly known as: ZYLOPRIM Take 1 tablet (100 mg total) by mouth daily.   amLODipine 10 MG tablet Commonly known as: NORVASC Take 1 tablet (10 mg total) by mouth daily.   aspirin 81 MG chewable tablet Chew 1 tablet (81 mg total) by mouth 2 (two) times daily. What changed: when to take this   atenolol 25 MG tablet Commonly known as: TENORMIN Take 1 tablet (25 mg total) by mouth daily.   atorvastatin 40 MG tablet Commonly known as: LIPITOR Take 1 tablet (40 mg total) by mouth daily at 6 PM. What changed: when to take this   benazepril 40 MG tablet Commonly known as: LOTENSIN Take 1 tablet (40 mg total) by mouth daily.   ceFAZolin  IVPB Commonly known as: ANCEF Inject 2 g into the vein every 8 (eight) hours. Indication:  PJI of left knee First Dose: Yes Last Day of Therapy:  03/12/2020 Labs - Once weekly:  CBC/D and BMP, Labs - Every other week:  ESR and CRP Method of administration: IV Push Method of administration may be changed at the discretion of home infusion pharmacist based upon assessment of the patient and/or caregiver's ability to self-administer the  medication ordered.   escitalopram 10 MG tablet Commonly known as: LEXAPRO Take 10 mg by mouth daily.   furosemide 40 MG tablet Commonly known as: LASIX Take 1 tablet (40 mg total) by mouth daily.   gabapentin 300 MG capsule Commonly known as: NEURONTIN Take 300 mg by mouth 2 (two) times daily.   ibuprofen 800 MG tablet Commonly known as: ADVIL Take 800 mg by mouth 2 (two) times daily.   methocarbamol 500 MG tablet Commonly known as: ROBAXIN Take 1 tablet (500 mg total) by  mouth every 6 (six) hours as needed for muscle spasms.   omeprazole 20 MG capsule Commonly known as: PRILOSEC Take 20 mg by mouth daily.   Oxycodone HCl 10 MG Tabs Take 1 tablet (10 mg total) by mouth every 4 (four) hours as needed for severe pain (pain score 7-10). What changed:   medication strength  how much to take  reasons to take this   potassium chloride 10 MEQ tablet Commonly known as: KLOR-CON Take 10 mEq by mouth daily.   spironolactone 25 MG tablet Commonly known as: ALDACTONE Take 25 mg by mouth daily.   zolpidem 10 MG tablet Commonly known as: AMBIEN Take 10 mg by mouth at bedtime as needed for sleep.            Durable Medical Equipment  (From admission, onward)         Start     Ordered   02/11/20 1016  For home use only DME Hospital bed  Once       Question Answer Comment  Length of Need 6 Months   Patient has (list medical condition): Infected L total knee replacement   The above medical condition requires: Patient requires the ability to reposition frequently   Head must be elevated greater than: 30 degrees   Bed type Semi-electric   Support Surface: Gel Overlay      02/11/20 1018           Discharge Care Instructions  (From admission, onward)         Start     Ordered   02/12/20 0000  Change dressing on IV access line weekly and PRN  (Home infusion instructions - [facility] )        02/12/20 0748          Diagnostic Studies: [place] EKG  SITE RITE  Result Date: 01/28/2020 If Site Rite image not attached, placement could not be confirmed due to current cardiac rhythm.  [place] EKG SITE RITE  Result Date: 01/25/2020 If Site Rite image not attached, placement could not be confirmed due to current cardiac rhythm.   Disposition: Discharge disposition: 01-Home or Self Care       Discharge Instructions    [facility] pharmacist to adjust dose for Vancomycin, Aminoglycosides and other anti-infective therapies as requested by physician.   Complete by: As directed    [facility] to provide Cath Flo 52m   Complete by: As directed    Administer for PICC line occlusion and as ordered by physician for other access device issues.   Anaphylaxis Kit: Provided to treat any anaphylactic reaction to the medication being provided to the patient if First Dose or when requested by physician   Complete by: As directed    Epinephrine 152mml vial / amp: Administer 0.28m61m0.28ml86mubcutaneously once for moderate to severe anaphylaxis, nurse to call physician and pharmacy when reaction occurs and call 911 if needed for immediate care   Diphenhydramine 50mg54mIV vial: Administer 25-50mg 49mM PRN for first dose reaction, rash, itching, mild reaction, nurse to call physician and pharmacy when reaction occurs   Sodium Chloride 0.9% NS 500ml I87mdminister if needed for hypovolemic blood pressure drop or as ordered by physician after call to physician with anaphylactic reaction   Change dressing on IV access line weekly and PRN   Complete by: As directed    Flush IV access with Sodium Chloride 0.9% and Heparin 10 units/ml or 100 units/ml   Complete  by: As directed    Home infusion instructions - [facility]   Complete by: As directed    Instructions: Flush IV access with Sodium Chloride 0.9% and Heparin 10units/ml or 100units/ml   Change dressing on IV access line: Weekly and PRN   Instructions Cath Flo 75m:  Administer for PICC Line occlusion and as ordered by physician for other access device   [facility] pharmacist to adjust dose for: Vancomycin, Aminoglycosides and other anti-infective therapies as requested by physician   Method of administration may be changed at the discretion of home infusion pharmacist based upon assessment of the patient and/or caregiver's ability to self-administer the medication ordered   Complete by: As directed    Negative Pressure Wound Therapy - Incisional   Complete by: As directed    Show patient how to attach and charge prevena vac. Should call office if alarms or stops working       Follow-up IGaffneyFollow up.   Specialty: HDrumrightWhy: [facility] will provide home health services ( RN, PT)       DNewt Minion MD. Schedule an appointment as soon as possible for a visit on 02/18/2020.   Specialty: Orthopedic Surgery Contact information: [phone]               Signed: CMcarthur Rossetti12/14/2021, 7:48 AM

## 2020-02-12 NOTE — Progress Notes (Signed)
I have talked with Pam, IV nurse and she instructed me not to allow the patient to be discharged until she has completed the zoom teaching with the patient's daughter who she lives with.  It is from my understanding that this amount of time will not be lengthy.  The patient is connected to a portable prevenna/ battery charged.  Ace wrap in place with left knee imobolizer on.  Ace wrap clean, dry and intact.  IV team completed port de access prior to discharge without any problems

## 2020-02-12 NOTE — Progress Notes (Signed)
Physical Therapy Treatment Patient Details Name: Crystal Patterson MRN: [id number] DOB: Mar 17, 1965 Today's Date: 02/12/2020    History of Present Illness Pt is a 54 y.o. female s/p initial L TKA on 01/04/20, now admitted 01/25/20 with drainage from L knee incision concerning for infection. S/p L knee I&Ds on 11/26, 12/1 and 12/6. Returned to OR 12/10 for further debridement of patellar wound and skin graft. PMH includes arthritis, HTN, bilateral finger amputations.    PT Comments    Session limited by increased pain this session. Patient refused OOB mobility due to pain and describing as burning, requested to remove KI. Educated patient on wear time of KI to limit ROM per MD orders, patient refused and removed KI. Patient refused practicing stair negotiation prior to returning home, educated patient on stair negotiation technique with patient able to recall instructions at end of session. Patient performed functional exercises within pain tolerance in supine. Patient continues to be limited by pain, decreased activity tolerance, impaired balance, generalized weakness, and impaired functional mobility. Continue to recommend HHPT following discharge to maximize functional mobility and safety.     Follow Up Recommendations  Home health PT;Supervision for mobility/OOB     Equipment Recommendations  None recommended by PT    Recommendations for Other Services       Precautions / Restrictions Precautions Precautions: Fall Precaution Comments: strict no ROM L knee. L knee wound vac Required Braces or Orthoses: Knee Immobilizer - Left Knee Immobilizer - Left: On at all times Restrictions Weight Bearing Restrictions: Yes LLE Weight Bearing: Weight bearing as tolerated    Mobility  Bed Mobility Overal bed mobility: Needs Assistance Bed Mobility: Supine to Sit;Sit to Supine     Supine to sit: Min assist Sit to supine: Min assist   General bed mobility comments: minA for L LE  management  Transfers                 General transfer comment: declined OOB mobility this date due to increased pain in L knee with pt describing as burning sensation  Ambulation/Gait                 Stairs             Wheelchair Mobility    Modified Rankin (Stroke Patients Only)       Balance Overall balance assessment: Needs assistance Sitting-balance support: Feet supported Sitting balance-Leahy Scale: Fair                                      Cognition Arousal/Alertness: Awake/alert Behavior During Therapy: WFL for tasks assessed/performed Overall Cognitive Status: Within Functional Limits for tasks assessed                                        Exercises Total Joint Exercises Ankle Circles/Pumps: AROM;Both;10 reps Quad Sets: AROM;Both;10 reps Hip ABduction/ADduction: AROM;Right;AAROM;Left;5 reps    General Comments General comments (skin integrity, edema, etc.): Patient refused OOB mobility following supine>sit due to increased pain and states it was the KI, educated patient about wearing KI at all times, patient refused. Educated patient on stair negotiation due to patient refusal to practice prior to returning home.      Pertinent Vitals/Pain Pain Assessment: Faces Faces Pain Scale: Hurts even more Pain Location: L knee Pain Descriptors /  Indicators: Grimacing;Guarding;Burning Pain Intervention(s): Monitored during session;Limited activity within patient's tolerance    Home Living                      Prior Function            PT Goals (current goals can now be found in the care plan section) Acute Rehab PT Goals Patient Stated Goal: I'd like to go home. PT Goal Formulation: With patient Time For Goal Achievement: 02/16/20 Potential to Achieve Goals: Good Progress towards PT goals: Progressing toward goals    Frequency    Min 5X/week      PT Plan Current plan remains  appropriate    Co-evaluation              AM-PAC PT "6 Clicks" Mobility   Outcome Measure  Help needed turning from your back to your side while in a flat bed without using bedrails?: A Little Help needed moving from lying on your back to sitting on the side of a flat bed without using bedrails?: A Little Help needed moving to and from a bed to a chair (including a wheelchair)?: A Little Help needed standing up from a chair using your arms (e.g., wheelchair or bedside chair)?: A Little Help needed to walk in hospital room?: A Little Help needed climbing 3-5 steps with a railing? : A Lot 6 Click Score: 17    End of Session Equipment Utilized During Treatment: Gait belt Activity Tolerance: Patient limited by pain Patient left: in bed;with call bell/phone within reach;with bed alarm set Nurse Communication: Mobility status PT Visit Diagnosis: Muscle weakness (generalized) (M62.81);Difficulty in walking, not elsewhere classified (R26.2)     Time: 4008-6761 PT Time Calculation (min) (ACUTE ONLY): 20 min  Charges:  $Therapeutic Activity: 8-22 mins                     Crystal Patterson, PT, DPT Acute Rehabilitation Services Pager [phone] Office [phone]    Crystal Patterson 02/12/2020, 9:02 AM

## 2020-02-12 NOTE — Care Management Important Message (Signed)
Important Message  Patient Details  Name: Crystal Patterson MRN: [id number] Date of Birth: Mar 29, 1965   Medicare Important Message Given:  Yes     Taralynn Quiett P Kaye Mitro 02/12/2020, 3:16 PM

## 2020-02-12 NOTE — TOC Transition Note (Signed)
Transition of Care [facility]) - CM/SW Discharge Note   Patient Details  Name: Crystal Patterson MRN: [id number] Date of Birth: Oct 06, 1965  [facility] CM/SW Contact:  Epifanio Lesches, RN Phone Number: 02/12/2020, 1:23 PM   Clinical Narrative:    Patient will DC to: home Anticipated DC date: 02/12/2020 Family notified: daughter, Sebastian Ache Transport by: car, daughter  L knee incision infection, hx of L TKA on 01/04/20  Per MD patient ready for DC today. Pt 's daughter to receive IV ABX therapy teaching by Pam([facility]) [phone] prior to d/c today per ZOOM, and delivery of hospital bed into the home before d/c by [facility] [phone].  Pt without  Rx med concerns or affordability.  Pt with noted post hospital  f/u on AVS.  RNCM will sign off for now as intervention is no longer needed. Please consult [place] again if new needs arise.  Kody Vigil (Daughter)     [phone]          Barriers to Discharge: No Barriers Identified   Patient Goals and CMS Choice Patient states their goals for this hospitalization and ongoing recovery are:: To get better and go home   Choice offered to / list presented to : Patient  Discharge Placement                       Discharge Plan and Services   Discharge Planning Services: CM Consult            DME Arranged: IV pump/equipment,Hospital bed (Referral made with [facility] for hospital bed) DME Agency: Other - Comment ([facility]/ Pam) Date DME Agency Contacted: 02/11/20 Time DME Agency Contacted: 1257 Representative spoke with at DME Agency: Brayton Mars Arranged: RN,PT   Date HH Agency Contacted: 02/11/20 Time HH Agency Contacted: 1301 Representative spoke with at [facility] Agency: [place]  Social Determinants of Health (SDOH) Interventions     Readmission Risk Interventions No flowsheet data found.

## 2020-02-13 DIAGNOSIS — M9080 Osteopathy in diseases classified elsewhere, unspecified site: Secondary | ICD-10-CM | POA: Diagnosis not present

## 2020-02-13 DIAGNOSIS — E785 Hyperlipidemia, unspecified: Secondary | ICD-10-CM | POA: Diagnosis not present

## 2020-02-13 DIAGNOSIS — L98494 Non-pressure chronic ulcer of skin of other sites with necrosis of bone: Secondary | ICD-10-CM | POA: Diagnosis not present

## 2020-02-13 DIAGNOSIS — M160 Bilateral primary osteoarthritis of hip: Secondary | ICD-10-CM | POA: Diagnosis not present

## 2020-02-13 DIAGNOSIS — D631 Anemia in chronic kidney disease: Secondary | ICD-10-CM | POA: Diagnosis not present

## 2020-02-13 DIAGNOSIS — B964 Proteus (mirabilis) (morganii) as the cause of diseases classified elsewhere: Secondary | ICD-10-CM | POA: Diagnosis not present

## 2020-02-13 DIAGNOSIS — N189 Chronic kidney disease, unspecified: Secondary | ICD-10-CM | POA: Diagnosis not present

## 2020-02-13 DIAGNOSIS — E7800 Pure hypercholesterolemia, unspecified: Secondary | ICD-10-CM | POA: Diagnosis not present

## 2020-02-13 DIAGNOSIS — I129 Hypertensive chronic kidney disease with stage 1 through stage 4 chronic kidney disease, or unspecified chronic kidney disease: Secondary | ICD-10-CM | POA: Diagnosis not present

## 2020-02-13 DIAGNOSIS — M1711 Unilateral primary osteoarthritis, right knee: Secondary | ICD-10-CM | POA: Diagnosis not present

## 2020-02-13 DIAGNOSIS — M329 Systemic lupus erythematosus, unspecified: Secondary | ICD-10-CM | POA: Diagnosis not present

## 2020-02-13 DIAGNOSIS — M791 Myalgia, unspecified site: Secondary | ICD-10-CM | POA: Diagnosis not present

## 2020-02-13 DIAGNOSIS — M109 Gout, unspecified: Secondary | ICD-10-CM | POA: Diagnosis not present

## 2020-02-13 DIAGNOSIS — T8454XA Infection and inflammatory reaction due to internal left knee prosthesis, initial encounter: Secondary | ICD-10-CM | POA: Diagnosis not present

## 2020-02-13 LAB — AEROBIC/ANAEROBIC CULTURE W GRAM STAIN (SURGICAL/DEEP WOUND): Gram Stain: NONE SEEN

## 2020-02-14 ENCOUNTER — Telehealth: Payer: Self-pay | Admitting: Orthopedic Surgery

## 2020-02-14 DIAGNOSIS — M160 Bilateral primary osteoarthritis of hip: Secondary | ICD-10-CM | POA: Diagnosis not present

## 2020-02-14 DIAGNOSIS — E7800 Pure hypercholesterolemia, unspecified: Secondary | ICD-10-CM | POA: Diagnosis not present

## 2020-02-14 DIAGNOSIS — T8454XA Infection and inflammatory reaction due to internal left knee prosthesis, initial encounter: Secondary | ICD-10-CM | POA: Diagnosis not present

## 2020-02-14 DIAGNOSIS — E785 Hyperlipidemia, unspecified: Secondary | ICD-10-CM | POA: Diagnosis not present

## 2020-02-14 DIAGNOSIS — M1711 Unilateral primary osteoarthritis, right knee: Secondary | ICD-10-CM | POA: Diagnosis not present

## 2020-02-14 DIAGNOSIS — M329 Systemic lupus erythematosus, unspecified: Secondary | ICD-10-CM | POA: Diagnosis not present

## 2020-02-14 DIAGNOSIS — M109 Gout, unspecified: Secondary | ICD-10-CM | POA: Diagnosis not present

## 2020-02-14 DIAGNOSIS — M791 Myalgia, unspecified site: Secondary | ICD-10-CM | POA: Diagnosis not present

## 2020-02-14 DIAGNOSIS — I129 Hypertensive chronic kidney disease with stage 1 through stage 4 chronic kidney disease, or unspecified chronic kidney disease: Secondary | ICD-10-CM | POA: Diagnosis not present

## 2020-02-14 DIAGNOSIS — N189 Chronic kidney disease, unspecified: Secondary | ICD-10-CM | POA: Diagnosis not present

## 2020-02-14 DIAGNOSIS — B964 Proteus (mirabilis) (morganii) as the cause of diseases classified elsewhere: Secondary | ICD-10-CM | POA: Diagnosis not present

## 2020-02-14 DIAGNOSIS — D631 Anemia in chronic kidney disease: Secondary | ICD-10-CM | POA: Diagnosis not present

## 2020-02-14 NOTE — Telephone Encounter (Signed)
Pt nurse called and need Crystal Patterson to sign/verbally the orders for the nurse to give her IV anabiotics.

## 2020-02-14 NOTE — Telephone Encounter (Signed)
Pt s/p excisional debridement and skin graft to knee with Dr. Lajoyce Corners called and sw nurse to advise ok per PA to give IV ABX. Will call with any other questions.

## 2020-02-14 NOTE — Telephone Encounter (Signed)
Please see below.

## 2020-02-14 NOTE — Telephone Encounter (Signed)
Ok to give antibiotics.

## 2020-02-15 DIAGNOSIS — T8454XA Infection and inflammatory reaction due to internal left knee prosthesis, initial encounter: Secondary | ICD-10-CM | POA: Diagnosis not present

## 2020-02-16 DIAGNOSIS — T8454XA Infection and inflammatory reaction due to internal left knee prosthesis, initial encounter: Secondary | ICD-10-CM | POA: Diagnosis not present

## 2020-02-16 DIAGNOSIS — E785 Hyperlipidemia, unspecified: Secondary | ICD-10-CM | POA: Diagnosis not present

## 2020-02-16 DIAGNOSIS — N189 Chronic kidney disease, unspecified: Secondary | ICD-10-CM | POA: Diagnosis not present

## 2020-02-16 DIAGNOSIS — E7800 Pure hypercholesterolemia, unspecified: Secondary | ICD-10-CM | POA: Diagnosis not present

## 2020-02-16 DIAGNOSIS — I129 Hypertensive chronic kidney disease with stage 1 through stage 4 chronic kidney disease, or unspecified chronic kidney disease: Secondary | ICD-10-CM | POA: Diagnosis not present

## 2020-02-16 DIAGNOSIS — M1711 Unilateral primary osteoarthritis, right knee: Secondary | ICD-10-CM | POA: Diagnosis not present

## 2020-02-16 DIAGNOSIS — D631 Anemia in chronic kidney disease: Secondary | ICD-10-CM | POA: Diagnosis not present

## 2020-02-16 DIAGNOSIS — M160 Bilateral primary osteoarthritis of hip: Secondary | ICD-10-CM | POA: Diagnosis not present

## 2020-02-16 DIAGNOSIS — B964 Proteus (mirabilis) (morganii) as the cause of diseases classified elsewhere: Secondary | ICD-10-CM | POA: Diagnosis not present

## 2020-02-16 DIAGNOSIS — M329 Systemic lupus erythematosus, unspecified: Secondary | ICD-10-CM | POA: Diagnosis not present

## 2020-02-16 DIAGNOSIS — M109 Gout, unspecified: Secondary | ICD-10-CM | POA: Diagnosis not present

## 2020-02-16 DIAGNOSIS — M791 Myalgia, unspecified site: Secondary | ICD-10-CM | POA: Diagnosis not present

## 2020-02-17 DIAGNOSIS — T8454XA Infection and inflammatory reaction due to internal left knee prosthesis, initial encounter: Secondary | ICD-10-CM | POA: Diagnosis not present

## 2020-02-18 ENCOUNTER — Encounter: Payer: Self-pay | Admitting: Orthopedic Surgery

## 2020-02-18 ENCOUNTER — Telehealth: Payer: Self-pay | Admitting: *Deleted

## 2020-02-18 ENCOUNTER — Ambulatory Visit (INDEPENDENT_AMBULATORY_CARE_PROVIDER_SITE_OTHER): Payer: Medicare Other | Admitting: Orthopedic Surgery

## 2020-02-18 VITALS — Ht 64.02 in | Wt 157.0 lb

## 2020-02-18 DIAGNOSIS — T8459XA Infection and inflammatory reaction due to other internal joint prosthesis, initial encounter: Secondary | ICD-10-CM

## 2020-02-18 DIAGNOSIS — T8454XA Infection and inflammatory reaction due to internal left knee prosthesis, initial encounter: Secondary | ICD-10-CM | POA: Diagnosis not present

## 2020-02-18 DIAGNOSIS — Z96659 Presence of unspecified artificial knee joint: Secondary | ICD-10-CM

## 2020-02-18 MED ORDER — SILVER SULFADIAZINE 1 % EX CREA: 1.0000 "application " | TOPICAL_CREAM | Freq: Every day | CUTANEOUS | 3 refills | Status: DC

## 2020-02-18 NOTE — Progress Notes (Signed)
Office Visit Note   Patient: Crystal Patterson           Date of Birth: Oct 15, 1965           MRN: [id number] Visit Date: 02/18/2020              Requested by: Avon Gully, MD [address] [place],  [place] [address] PCP: Avon Gully, MD  Chief Complaint  Patient presents with  . Left Knee - Routine Post Op    02/08/2020 Excisional debridement left knee      HPI: Patient is a patient has been in a knee immobilizer.  54 year old woman with SLE status post total knee arthroplasty on the left with wound breakdown dehiscence and ischemic changes of the extensor mechanism.  Patient is status post debridement local tissue skin rearrangement with application of Kerecis skin graft.  Assessment & Plan: Visit Diagnoses:  1. Infection of total knee replacement, initial encounter (HCC)     Plan: There is improvement in the soft tissue envelope there is some mild ischemic changes over the patella.  We will plan for Silvadene dressing changes daily with Dial soap cleansing continue with the knee immobilizer follow-up in 2 weeks at which time anticipate we could reevaluate for additional skin graft.  Follow-Up Instructions: Return in about 2 weeks (around 03/03/2020).   Ortho Exam  Patient is alert, oriented, no adenopathy, well-dressed, normal affect, normal respiratory effort. Examination the total knee is covered there is no exposed joint.  There is some mild ischemic changes over the patella.  There is some granulation tissue over the patella tendon.  There is no cellulitis no drainage no signs of infection.  Imaging: No results found. No images are attached to the encounter.  Labs: Lab Results  Component Value Date   HGBA1C 4.6 (L) 05/25/2016   ESRSEDRATE 42 (H) 02/04/2020   ESRSEDRATE 70 (H) 01/26/2020   ESRSEDRATE 6 07/09/2008   CRP 8.4 (H) 02/04/2020   CRP 14.8 (H) 01/26/2020   CRP 0.3 07/09/2008   REPTSTATUS 02/13/2020 FINAL 02/08/2020   GRAMSTAIN NO WBC SEEN NO  ORGANISMS SEEN  02/08/2020   CULT  02/08/2020    RARE PROTEUS MIRABILIS DR. BLACKMAN NOTIFIED VIA EPIC MESSAGE REGARDING CULTURE GROWTH NO ANAEROBES ISOLATED Performed at [facility], [address] [place], [place] [address]    Imelda Pillow PROTEUS MIRABILIS 02/08/2020     Lab Results  Component Value Date   ALBUMIN 2.5 (L) 02/04/2020   ALBUMIN 3.4 (L) 12/06/2016   ALBUMIN 3.2 (L) 05/25/2016    Lab Results  Component Value Date   MG 1.5 (L) 12/06/2016   MG 1.4 (L) 05/25/2016   No results found for: VD25OH  No results found for: PREALBUMIN CBC EXTENDED Latest Ref Rng & Units 02/07/2020 02/07/2020 02/06/2020  WBC 4.0 - 10.5 K/uL 15.3(H) 18.3(H) 9.5  RBC 3.87 - 5.11 MIL/uL 3.18(L) 3.05(L) 3.30(L)  HGB 12.0 - 15.0 g/dL 8.1(W) 2.9(H) 10.0(L)  HCT 36.0 - 46.0 % 31.0(L) 28.8(L) 31.8(L)  PLT 150 - 400 K/uL 273 269 277  NEUTROABS 1.7 - 7.7 K/uL - - -  LYMPHSABS 0.7 - 4.0 K/uL - - -     Body mass index is 26.93 kg/m.  Orders:  No orders of the defined types were placed in this encounter.  Meds ordered this encounter  Medications  . silver sulfADIAZINE (SILVADENE) 1 % cream    Sig: Apply 1 application topically daily. Apply to affected area daily plus dry dressing  Dispense:  400 g    Refill:  3     Procedures: No procedures performed  Clinical Data: No additional findings.  ROS:  All other systems negative, except as noted in the HPI. Review of Systems  Objective: Vital Signs: Ht 5' 4.02" (1.626 m)   Wt 157 lb (71.2 kg)   BMI 26.93 kg/m   Specialty Comments:  No specialty comments available.  PMFS History: Patient Active Problem List   Diagnosis Date Noted  . Open knee wound, left, sequela   . 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 Thrombosis of left ulnar artery (HCC) 01/23/2020  . Status post total knee replacement 01/06/2020  . Status post total  left knee replacement 01/06/2020  . Status post total knee replacement, left 01/04/2020  . Unilateral primary osteoarthritis, left knee 09/26/2019  . Unilateral primary osteoarthritis, right knee 09/26/2019  . Systemic lupus erythematosus arthritis (HCC) 09/12/2019  . Arthritis of both knees 09/12/2019  . CAD (coronary artery disease) 07/14/2017  . Tobacco use disorder 12/30/2016  . Metabolic bone disease 12/30/2016  . Marijuana use 12/30/2016  . Syncope 12/06/2016  . ETOH abuse 12/06/2016  . PAD (peripheral artery disease) (HCC) 12/06/2016  . LVH (left ventricular hypertrophy) 05/26/2016  . Elevated troponin 05/24/2016  . Pain, hand 01/01/2013  . History of lupus nephritis 03/23/2011  . Raynaud's phenomenon 03/23/2011  . HYPOKALEMIA 08/19/2008  . FATTY LIVER DISEASE 05/09/2008  . WEIGHT LOSS 03/28/2008  . RAYNAUDS SYNDROME 02/15/2008  . CARDIAC MURMUR 11/16/2007  . Systemic lupus erythematosus (HCC) 05/25/2006  . GOUT 02/02/2006  . DEPRESSION 02/02/2006  . MIGRAINE HEADACHE 02/02/2006  . Essential hypertension 02/02/2006  . ALLERGIC RHINITIS 02/02/2006  . Asthma 02/02/2006  . GERD 02/02/2006  . IBS 02/02/2006  . OVERACTIVE BLADDER 02/02/2006   Past Medical History:  Diagnosis Date  . Anginal pain (HCC) 2020  . Arthritis    Knees, hips,  . Elevated troponin 05/24/2016  . Finger amputation, no complication   . Heart murmur   . Hypercholesteremia   . Hypertension   . 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 Lupus (HCC)     Family History  Problem Relation Age of Onset  . Pancreatic cancer Mother   . Colon cancer Father     Past Surgical History:  Procedure Laterality Date  . CARDIAC CATHETERIZATION    . COLONOSCOPY N/A 01/17/2019   Procedure: COLONOSCOPY;  Surgeon: Corbin Ade, MD;  [facility];  Service: Endoscopy;  Laterality: N/A;  10:30  . EXCISIONAL TOTAL KNEE ARTHROPLASTY Left 02/05/2020   Procedure: REPEAT IRRIGATION AND  DEBRIDEMENT LEFT KNEE;  Surgeon: [name], MD;  Location: [facility];  Service: Orthopedics;  Laterality: Left;  . I & D EXTREMITY Left 02/08/2020   Procedure: EXCISIONAL DEBRIDEMENT LEFT KNEE;  Surgeon: Nadara Mustard, MD;  Location: [facility] OR;  Service: Orthopedics;  Laterality: Left;  . I & D KNEE WITH POLY EXCHANGE Left 01/25/2020   Procedure: INCISION AND DRAINAGE LEFT KNEE WITH POLY-LINER EXCHANGE;  Surgeon: [name], MD;  Location: [facility];  Service: Orthopedics;  Laterality: Left;  . IRRIGATION AND DEBRIDEMENT KNEE Left 01/30/2020   Procedure: REPEAT IRRIGATION AND DEBRIDEMENT LEFT KNEE PLACEMENT OF WOUND VAC;  Surgeon: [name], MD;  Location: [facility];  Service: Orthopedics;  Laterality: Left;  . left finger amputations    . POLYPECTOMY  01/17/2019   Procedure: POLYPECTOMY;  Surgeon: Corbin Ade, MD;  [facility];  Service: Endoscopy;;  . right hand surgery    . right knee arthroscopy    . RIGHT/LEFT HEART CATH AND CORONARY ANGIOGRAPHY N/A 05/25/2016   Procedure: Right/Left Heart Cath and Coronary Angiography;  Surgeon: Lennette Bihari, MD;  [facility];  Service: Cardiovascular;  Laterality: N/A;  . SKIN SPLIT GRAFT Left 02/08/2020   Procedure: SKIN GRAFT SPLIT THICKNESS LEFT KNEE;  Surgeon: Nadara Mustard, MD;  Location: [facility] OR;  Service: Orthopedics;  Laterality: Left;  . TOTAL KNEE ARTHROPLASTY Left 01/04/2020   Procedure: LEFT TOTAL KNEE ARTHROPLASTY;  Surgeon: [name], MD;  Location: [facility];  Service: Orthopedics;  Laterality: Left;   Social History   Occupational History  . Occupation: unemployed  Tobacco Use  . Smoking status: Current Every Day Smoker    Packs/day: 0.25    Years: 15.00    Pack years: 3.75    Types: Cigarettes  . Smokeless tobacco: Never Used  Vaping Use  . Vaping Use: Never used  Substance and Sexual Activity  . Alcohol use: Yes    Alcohol/week: 3.0 standard drinks     Types: 3 Cans of beer per week  . Drug use: Yes    Types: Marijuana    Comment: l  . Sexual activity: Not Currently

## 2020-02-18 NOTE — Telephone Encounter (Signed)
Ortho bundle 30 day in office visit. Patient is about 45 days post surgery, but has been in hospital due to knee infection after surgery.

## 2020-02-19 DIAGNOSIS — N189 Chronic kidney disease, unspecified: Secondary | ICD-10-CM | POA: Diagnosis not present

## 2020-02-19 DIAGNOSIS — T8454XA Infection and inflammatory reaction due to internal left knee prosthesis, initial encounter: Secondary | ICD-10-CM | POA: Diagnosis not present

## 2020-02-19 DIAGNOSIS — M329 Systemic lupus erythematosus, unspecified: Secondary | ICD-10-CM | POA: Diagnosis not present

## 2020-02-19 DIAGNOSIS — E7800 Pure hypercholesterolemia, unspecified: Secondary | ICD-10-CM | POA: Diagnosis not present

## 2020-02-19 DIAGNOSIS — B964 Proteus (mirabilis) (morganii) as the cause of diseases classified elsewhere: Secondary | ICD-10-CM | POA: Diagnosis not present

## 2020-02-19 DIAGNOSIS — E785 Hyperlipidemia, unspecified: Secondary | ICD-10-CM | POA: Diagnosis not present

## 2020-02-19 DIAGNOSIS — M869 Osteomyelitis, unspecified: Secondary | ICD-10-CM | POA: Diagnosis not present

## 2020-02-19 DIAGNOSIS — M9080 Osteopathy in diseases classified elsewhere, unspecified site: Secondary | ICD-10-CM | POA: Diagnosis not present

## 2020-02-19 DIAGNOSIS — L98494 Non-pressure chronic ulcer of skin of other sites with necrosis of bone: Secondary | ICD-10-CM | POA: Diagnosis not present

## 2020-02-19 DIAGNOSIS — M160 Bilateral primary osteoarthritis of hip: Secondary | ICD-10-CM | POA: Diagnosis not present

## 2020-02-19 DIAGNOSIS — I129 Hypertensive chronic kidney disease with stage 1 through stage 4 chronic kidney disease, or unspecified chronic kidney disease: Secondary | ICD-10-CM | POA: Diagnosis not present

## 2020-02-19 DIAGNOSIS — M791 Myalgia, unspecified site: Secondary | ICD-10-CM | POA: Diagnosis not present

## 2020-02-19 DIAGNOSIS — D631 Anemia in chronic kidney disease: Secondary | ICD-10-CM | POA: Diagnosis not present

## 2020-02-19 DIAGNOSIS — M109 Gout, unspecified: Secondary | ICD-10-CM | POA: Diagnosis not present

## 2020-02-19 DIAGNOSIS — M1711 Unilateral primary osteoarthritis, right knee: Secondary | ICD-10-CM | POA: Diagnosis not present

## 2020-02-20 DIAGNOSIS — T8454XA Infection and inflammatory reaction due to internal left knee prosthesis, initial encounter: Secondary | ICD-10-CM | POA: Diagnosis not present

## 2020-02-21 ENCOUNTER — Telehealth: Payer: Self-pay

## 2020-02-21 ENCOUNTER — Other Ambulatory Visit: Payer: Self-pay | Admitting: Surgical

## 2020-02-21 ENCOUNTER — Telehealth: Payer: Self-pay | Admitting: Orthopedic Surgery

## 2020-02-21 DIAGNOSIS — I7300 Raynaud's syndrome without gangrene: Secondary | ICD-10-CM | POA: Diagnosis not present

## 2020-02-21 DIAGNOSIS — T8454XA Infection and inflammatory reaction due to internal left knee prosthesis, initial encounter: Secondary | ICD-10-CM | POA: Diagnosis not present

## 2020-02-21 DIAGNOSIS — I739 Peripheral vascular disease, unspecified: Secondary | ICD-10-CM | POA: Diagnosis not present

## 2020-02-21 DIAGNOSIS — Z89029 Acquired absence of unspecified finger(s): Secondary | ICD-10-CM | POA: Diagnosis not present

## 2020-02-21 DIAGNOSIS — M329 Systemic lupus erythematosus, unspecified: Secondary | ICD-10-CM | POA: Diagnosis not present

## 2020-02-21 MED ORDER — OXYCODONE HCL 10 MG PO TABS: 10.0000 mg | ORAL_TABLET | Freq: Four times a day (QID) | ORAL | 0 refills | Status: DC | PRN

## 2020-02-21 NOTE — Telephone Encounter (Signed)
I left voicemail asking for Crystal Patterson to return my call. Voicemail did not state who it belonged to or that it was secure line for patient information. Will wait for return call.

## 2020-02-21 NOTE — Telephone Encounter (Signed)
Can you please advise since Dr. Duda is out of the office? °

## 2020-02-21 NOTE — Telephone Encounter (Signed)
Patient called. She would like oxycodone called in for her. Her call back number is [phone]

## 2020-02-21 NOTE — Telephone Encounter (Signed)
Sent in and called

## 2020-02-21 NOTE — Telephone Encounter (Signed)
Okey Regal from [facility] called to verify wound care orders for patients left knee   [phone]

## 2020-02-22 DIAGNOSIS — T8454XA Infection and inflammatory reaction due to internal left knee prosthesis, initial encounter: Secondary | ICD-10-CM | POA: Diagnosis not present

## 2020-02-23 DIAGNOSIS — T8454XA Infection and inflammatory reaction due to internal left knee prosthesis, initial encounter: Secondary | ICD-10-CM | POA: Diagnosis not present

## 2020-02-24 DIAGNOSIS — M109 Gout, unspecified: Secondary | ICD-10-CM | POA: Diagnosis not present

## 2020-02-24 DIAGNOSIS — M1711 Unilateral primary osteoarthritis, right knee: Secondary | ICD-10-CM | POA: Diagnosis not present

## 2020-02-24 DIAGNOSIS — E785 Hyperlipidemia, unspecified: Secondary | ICD-10-CM | POA: Diagnosis not present

## 2020-02-24 DIAGNOSIS — E7800 Pure hypercholesterolemia, unspecified: Secondary | ICD-10-CM | POA: Diagnosis not present

## 2020-02-24 DIAGNOSIS — M329 Systemic lupus erythematosus, unspecified: Secondary | ICD-10-CM | POA: Diagnosis not present

## 2020-02-24 DIAGNOSIS — T8454XA Infection and inflammatory reaction due to internal left knee prosthesis, initial encounter: Secondary | ICD-10-CM | POA: Diagnosis not present

## 2020-02-24 DIAGNOSIS — M160 Bilateral primary osteoarthritis of hip: Secondary | ICD-10-CM | POA: Diagnosis not present

## 2020-02-24 DIAGNOSIS — I129 Hypertensive chronic kidney disease with stage 1 through stage 4 chronic kidney disease, or unspecified chronic kidney disease: Secondary | ICD-10-CM | POA: Diagnosis not present

## 2020-02-24 DIAGNOSIS — D631 Anemia in chronic kidney disease: Secondary | ICD-10-CM | POA: Diagnosis not present

## 2020-02-24 DIAGNOSIS — M791 Myalgia, unspecified site: Secondary | ICD-10-CM | POA: Diagnosis not present

## 2020-02-24 DIAGNOSIS — B964 Proteus (mirabilis) (morganii) as the cause of diseases classified elsewhere: Secondary | ICD-10-CM | POA: Diagnosis not present

## 2020-02-24 DIAGNOSIS — N189 Chronic kidney disease, unspecified: Secondary | ICD-10-CM | POA: Diagnosis not present

## 2020-02-25 DIAGNOSIS — I7300 Raynaud's syndrome without gangrene: Secondary | ICD-10-CM | POA: Diagnosis not present

## 2020-02-25 DIAGNOSIS — Z96652 Presence of left artificial knee joint: Secondary | ICD-10-CM | POA: Diagnosis not present

## 2020-02-25 DIAGNOSIS — I10 Essential (primary) hypertension: Secondary | ICD-10-CM | POA: Diagnosis not present

## 2020-02-25 DIAGNOSIS — T8454XA Infection and inflammatory reaction due to internal left knee prosthesis, initial encounter: Secondary | ICD-10-CM | POA: Diagnosis not present

## 2020-02-25 DIAGNOSIS — M329 Systemic lupus erythematosus, unspecified: Secondary | ICD-10-CM | POA: Diagnosis not present

## 2020-02-25 NOTE — Telephone Encounter (Signed)
noted 

## 2020-02-26 ENCOUNTER — Telehealth: Payer: Self-pay | Admitting: Orthopedic Surgery

## 2020-02-26 DIAGNOSIS — M160 Bilateral primary osteoarthritis of hip: Secondary | ICD-10-CM | POA: Diagnosis not present

## 2020-02-26 DIAGNOSIS — M329 Systemic lupus erythematosus, unspecified: Secondary | ICD-10-CM | POA: Diagnosis not present

## 2020-02-26 DIAGNOSIS — E785 Hyperlipidemia, unspecified: Secondary | ICD-10-CM | POA: Diagnosis not present

## 2020-02-26 DIAGNOSIS — E7800 Pure hypercholesterolemia, unspecified: Secondary | ICD-10-CM | POA: Diagnosis not present

## 2020-02-26 DIAGNOSIS — B964 Proteus (mirabilis) (morganii) as the cause of diseases classified elsewhere: Secondary | ICD-10-CM | POA: Diagnosis not present

## 2020-02-26 DIAGNOSIS — N189 Chronic kidney disease, unspecified: Secondary | ICD-10-CM | POA: Diagnosis not present

## 2020-02-26 DIAGNOSIS — M1711 Unilateral primary osteoarthritis, right knee: Secondary | ICD-10-CM | POA: Diagnosis not present

## 2020-02-26 DIAGNOSIS — I129 Hypertensive chronic kidney disease with stage 1 through stage 4 chronic kidney disease, or unspecified chronic kidney disease: Secondary | ICD-10-CM | POA: Diagnosis not present

## 2020-02-26 DIAGNOSIS — A499 Bacterial infection, unspecified: Secondary | ICD-10-CM | POA: Diagnosis not present

## 2020-02-26 DIAGNOSIS — M9080 Osteopathy in diseases classified elsewhere, unspecified site: Secondary | ICD-10-CM | POA: Diagnosis not present

## 2020-02-26 DIAGNOSIS — D631 Anemia in chronic kidney disease: Secondary | ICD-10-CM | POA: Diagnosis not present

## 2020-02-26 DIAGNOSIS — L98494 Non-pressure chronic ulcer of skin of other sites with necrosis of bone: Secondary | ICD-10-CM | POA: Diagnosis not present

## 2020-02-26 DIAGNOSIS — M791 Myalgia, unspecified site: Secondary | ICD-10-CM | POA: Diagnosis not present

## 2020-02-26 DIAGNOSIS — M109 Gout, unspecified: Secondary | ICD-10-CM | POA: Diagnosis not present

## 2020-02-26 DIAGNOSIS — T8454XA Infection and inflammatory reaction due to internal left knee prosthesis, initial encounter: Secondary | ICD-10-CM | POA: Diagnosis not present

## 2020-02-26 NOTE — Telephone Encounter (Signed)
Okey Regal with [facility] called stating she missed a call from betsy and can be reached at: [phone]

## 2020-02-27 DIAGNOSIS — T8454XA Infection and inflammatory reaction due to internal left knee prosthesis, initial encounter: Secondary | ICD-10-CM | POA: Diagnosis not present

## 2020-02-27 NOTE — Telephone Encounter (Signed)
Duplicate message in chart.  

## 2020-02-27 NOTE — Telephone Encounter (Signed)
I spoke with Okey Regal and advised, silvadene dressing changes daily and dial soap cleansing. Continue knee immobilizer.

## 2020-02-28 DIAGNOSIS — T8454XA Infection and inflammatory reaction due to internal left knee prosthesis, initial encounter: Secondary | ICD-10-CM | POA: Diagnosis not present

## 2020-02-29 DIAGNOSIS — T8454XA Infection and inflammatory reaction due to internal left knee prosthesis, initial encounter: Secondary | ICD-10-CM | POA: Diagnosis not present

## 2020-03-03 ENCOUNTER — Encounter: Payer: Self-pay | Admitting: Orthopedic Surgery

## 2020-03-03 ENCOUNTER — Ambulatory Visit (INDEPENDENT_AMBULATORY_CARE_PROVIDER_SITE_OTHER): Payer: Medicare Other | Admitting: Orthopedic Surgery

## 2020-03-03 ENCOUNTER — Other Ambulatory Visit: Payer: Self-pay | Admitting: Physician Assistant

## 2020-03-03 VITALS — Ht 64.0 in | Wt 157.0 lb

## 2020-03-03 DIAGNOSIS — T8131XS Disruption of external operation (surgical) wound, not elsewhere classified, sequela: Secondary | ICD-10-CM

## 2020-03-03 DIAGNOSIS — Z96659 Presence of unspecified artificial knee joint: Secondary | ICD-10-CM

## 2020-03-03 DIAGNOSIS — M329 Systemic lupus erythematosus, unspecified: Secondary | ICD-10-CM

## 2020-03-03 DIAGNOSIS — T8459XA Infection and inflammatory reaction due to other internal joint prosthesis, initial encounter: Secondary | ICD-10-CM

## 2020-03-03 NOTE — Progress Notes (Signed)
Office Visit Note   Patient: Crystal Patterson           Date of Birth: 09/04/1965           MRN: [id number] Visit Date: 03/03/2020              Requested by: Rosita Fire, MD [address] [place],  [place] [address] PCP: Rosita Fire, MD  Chief Complaint  Patient presents with  . Left Knee - Follow-up    Left knee excisional debridement 02/08/2020      HPI: Patient is a 55 year old woman who is seen in follow-up for her left total knee.  Patient had wound dehiscence after her index surgery and underwent debridement local tissue rearrangement and skin grafting with a wound VAC for limb salvage.  Patient had slight wound breakdown on her last examination patient presents today with progressive wound dehiscence with exposed total joint.  Assessment & Plan: Visit Diagnoses:  1. Infection of total knee replacement, initial encounter ([place])   2. Systemic lupus erythematosus arthritis ([place])   3. Dehiscence of closure of skin, sequela     Plan: With the complete dehiscence and exposed total joint patient does not have limb salvage intervention options.  Will need to proceed with a above-the-knee amputation.  Discussed discharge planning with either discharge to home with home health nursing versus inpatient rehab versus skilled nursing placement.  Patient states she understands and wished to proceed with surgery on Friday.  Discussed the importance of nutrition and protein intake for wound healing.  Follow-Up Instructions: Return in about 2 weeks (around 03/17/2020).   Ortho Exam  Patient is alert, oriented, no adenopathy, well-dressed, normal affect, normal respiratory effort. Examination there is progressive rapid wound dehiscence of the left total knee tissue rearrangement and wound closure with skin graft.  She now has a necrotic wound that is 13 x 5 cm the patella button is visible the tibial tray and total joint are visible through the necrotic wound.  The patella tendon is  completely necrosed and patient has no active extensor function.  The patella is necrotic as well with no healthy viable bone for the patella.  Imaging: No results found. No images are attached to the encounter.  Labs: Lab Results  Component Value Date   HGBA1C 4.6 (L) 05/25/2016   ESRSEDRATE 42 (H) 02/04/2020   ESRSEDRATE 70 (H) 01/26/2020   ESRSEDRATE 6 07/09/2008   CRP 8.4 (H) 02/04/2020   CRP 14.8 (H) 01/26/2020   CRP 0.3 07/09/2008   REPTSTATUS 02/13/2020 FINAL 02/08/2020   GRAMSTAIN NO WBC SEEN NO ORGANISMS SEEN  02/08/2020   CULT  02/08/2020    RARE PROTEUS MIRABILIS DR. BLACKMAN NOTIFIED VIA EPIC MESSAGE REGARDING CULTURE GROWTH NO ANAEROBES ISOLATED Performed at [facility], [place] [address] [place], [place] [address]    LABORGA PROTEUS MIRABILIS 02/08/2020     Lab Results  Component Value Date   ALBUMIN 2.5 (L) 02/04/2020   ALBUMIN 3.4 (L) 12/06/2016   ALBUMIN 3.2 (L) 05/25/2016    Lab Results  Component Value Date   MG 1.5 (L) 12/06/2016   MG 1.4 (L) 05/25/2016   No results found for: VD25OH  No results found for: PREALBUMIN CBC EXTENDED Latest Ref Rng & Units 02/07/2020 02/07/2020 02/06/2020  WBC 4.0 - 10.5 K/uL 15.3(H) 18.3(H) 9.5  RBC 3.87 - 5.11 MIL/uL 3.18(L) 3.05(L) 3.30(L)  HGB 12.0 - 15.0 g/dL 9.4(L) 9.4(L) 10.0(L)  HCT 36.0 - 46.0 % 31.0(L) 28.8(L) 31.8(L)  PLT 150 - 400 K/uL 273 269 277  NEUTROABS 1.7 - 7.7 K/uL - - -  LYMPHSABS 0.7 - 4.0 K/uL - - -     Body mass index is 26.95 kg/m.  Orders:  No orders of the defined types were placed in this encounter.  No orders of the defined types were placed in this encounter.    Procedures: No procedures performed  Clinical Data: No additional findings.  ROS:  All other systems negative, except as noted in the HPI. Review of Systems  Objective: Vital Signs: Ht 5\' 4"  (1.626 m)   Wt 157 lb (71.2 kg)   BMI 26.95 kg/m   Specialty Comments:  No specialty comments  available.  PMFS History: Patient Active Problem List   Diagnosis Date Noted  . Open knee wound, left, sequela   . 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 Thrombosis of left ulnar artery (HCC) 01/23/2020  . Status post total knee replacement 01/06/2020  . Status post total left knee replacement 01/06/2020  . Status post total knee replacement, left 01/04/2020  . Unilateral primary osteoarthritis, left knee 09/26/2019  . Unilateral primary osteoarthritis, right knee 09/26/2019  . Systemic lupus erythematosus arthritis (HCC) 09/12/2019  . Arthritis of both knees 09/12/2019  . CAD (coronary artery disease) 07/14/2017  . Tobacco use disorder 12/30/2016  . Metabolic bone disease 12/30/2016  . Marijuana use 12/30/2016  . Syncope 12/06/2016  . ETOH abuse 12/06/2016  . PAD (peripheral artery disease) (HCC) 12/06/2016  . LVH (left ventricular hypertrophy) 05/26/2016  . Elevated troponin 05/24/2016  . Pain, hand 01/01/2013  . History of lupus nephritis 03/23/2011  . Raynaud's phenomenon 03/23/2011  . HYPOKALEMIA 08/19/2008  . FATTY LIVER DISEASE 05/09/2008  . WEIGHT LOSS 03/28/2008  . RAYNAUDS SYNDROME 02/15/2008  . CARDIAC MURMUR 11/16/2007  . Systemic lupus erythematosus (HCC) 05/25/2006  . GOUT 02/02/2006  . DEPRESSION 02/02/2006  . MIGRAINE HEADACHE 02/02/2006  . Essential hypertension 02/02/2006  . ALLERGIC RHINITIS 02/02/2006  . Asthma 02/02/2006  . GERD 02/02/2006  . IBS 02/02/2006  . OVERACTIVE BLADDER 02/02/2006   Past Medical History:  Diagnosis Date  . Anginal pain (HCC) 2020  . Arthritis    Knees, hips,  . Elevated troponin 05/24/2016  . Finger amputation, no complication   . Heart murmur   . Hypercholesteremia   . Hypertension   . 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 Lupus (HCC)     Family History  Problem Relation Age of Onset  .  Pancreatic cancer Mother   . Colon cancer Father     Past Surgical History:  Procedure Laterality Date  . CARDIAC CATHETERIZATION    . COLONOSCOPY N/A 01/17/2019   Procedure: COLONOSCOPY;  Surgeon: 01/19/2019, MD;  [facility];  Service: Endoscopy;  Laterality: N/A;  10:30  . EXCISIONAL TOTAL KNEE ARTHROPLASTY Left 02/05/2020   Procedure: REPEAT IRRIGATION AND DEBRIDEMENT LEFT KNEE;  Surgeon: 14/08/2019, MD;  Location: [facility];  Service: Orthopedics;  Laterality: Left;  . I & D EXTREMITY Left 02/08/2020   Procedure: EXCISIONAL DEBRIDEMENT LEFT KNEE;  Surgeon: 14/11/2019, MD;  Location: [facility] OR;  Service: Orthopedics;  Laterality: Left;  . I & D KNEE WITH POLY EXCHANGE Left 01/25/2020   Procedure: INCISION AND DRAINAGE LEFT KNEE WITH POLY-LINER EXCHANGE;  Surgeon: 01/27/2020, MD;  Location: [facility];  Service:  Orthopedics;  Laterality: Left;  . IRRIGATION AND DEBRIDEMENT KNEE Left 01/30/2020   Procedure: REPEAT IRRIGATION AND DEBRIDEMENT LEFT KNEE PLACEMENT OF WOUND VAC;  Surgeon: [name], MD;  Location: [facility];  Service: Orthopedics;  Laterality: Left;  . left finger amputations    . POLYPECTOMY  01/17/2019   Procedure: POLYPECTOMY;  Surgeon: Corbin Ade, MD;  [facility];  Service: Endoscopy;;  . right hand surgery    . right knee arthroscopy    . RIGHT/LEFT HEART CATH AND CORONARY ANGIOGRAPHY N/A 05/25/2016   Procedure: Right/Left Heart Cath and Coronary Angiography;  Surgeon: Lennette Bihari, MD;  [facility];  Service: Cardiovascular;  Laterality: N/A;  . SKIN SPLIT GRAFT Left 02/08/2020   Procedure: SKIN GRAFT SPLIT THICKNESS LEFT KNEE;  Surgeon: Nadara Mustard, MD;  Location: [facility] OR;  Service: Orthopedics;  Laterality: Left;  . TOTAL KNEE ARTHROPLASTY Left 01/04/2020   Procedure: LEFT TOTAL KNEE ARTHROPLASTY;  Surgeon: [name], MD;  Location: [facility];  Service: Orthopedics;  Laterality:  Left;   Social History   Occupational History  . Occupation: unemployed  Tobacco Use  . Smoking status: Current Every Day Smoker    Packs/day: 0.25    Years: 15.00    Pack years: 3.75    Types: Cigarettes  . Smokeless tobacco: Never Used  Vaping Use  . Vaping Use: Never used  Substance and Sexual Activity  . Alcohol use: Yes    Alcohol/week: 3.0 standard drinks    Types: 3 Cans of beer per week  . Drug use: Yes    Types: Marijuana    Comment: l  . Sexual activity: Not Currently

## 2020-03-04 ENCOUNTER — Telehealth: Payer: Self-pay

## 2020-03-04 NOTE — Telephone Encounter (Signed)
Received call from patient's daughter stating the patient is scheduled to have above knee amputation on Friday 03/07/20. Daughter is wondering if they still need to come in for their appointment to see Dr. Daiva Eves tomorrow. Per Dr. Daiva Eves, can cancel tomorrow's appointment. Patient's daughter made aware and has no further questions.   Sandie Ano, RN

## 2020-03-05 ENCOUNTER — Other Ambulatory Visit: Payer: Self-pay

## 2020-03-05 ENCOUNTER — Ambulatory Visit: Payer: Medicare Other | Admitting: Infectious Disease

## 2020-03-05 ENCOUNTER — Other Ambulatory Visit (HOSPITAL_COMMUNITY): Payer: Medicare Other

## 2020-03-06 ENCOUNTER — Other Ambulatory Visit: Payer: Self-pay | Admitting: Physician Assistant

## 2020-03-06 ENCOUNTER — Telehealth: Payer: Self-pay | Admitting: Orthopedic Surgery

## 2020-03-06 MED ORDER — OXYCODONE HCL 10 MG PO TABS: 10.0000 mg | ORAL_TABLET | Freq: Four times a day (QID) | ORAL | 0 refills | Status: DC | PRN

## 2020-03-06 NOTE — Telephone Encounter (Signed)
Left knee excisional debridement 02/08/2020 last refill On oxycodone 10 mg was 02/21/20 #30 requesting refill please advise.

## 2020-03-06 NOTE — Telephone Encounter (Signed)
I called pt to advise that rx has been sent to pharm.  

## 2020-03-06 NOTE — Progress Notes (Addendum)
Called patient to schedule covid test.  Patient's daughter states that they are wanting to reschedule surgery and they have left a message at Dr. Audrie Lia office.  Instructed patient's daughter to reach out to office again.    Nurse also left message with Consuello Bossier, at Dr. Audrie Lia office.

## 2020-03-06 NOTE — Telephone Encounter (Signed)
Pt called and needs a refill on oxycodone.  

## 2020-03-06 NOTE — Telephone Encounter (Signed)
done

## 2020-03-06 NOTE — Progress Notes (Signed)
Dr. Lajoyce Corners called regarding patient's surgery scheduled for tomorrow. Dr. Lajoyce Corners stated that surgery was cancelled at this time due to patient's request. (see Cyndia Diver, RN's note)  Vikki Ports, OR desk RN notified to cancel case at this time. Verbalized understanding.

## 2020-03-07 ENCOUNTER — Encounter (HOSPITAL_COMMUNITY): Admission: RE | Payer: Self-pay | Source: Home / Self Care

## 2020-03-07 ENCOUNTER — Inpatient Hospital Stay (HOSPITAL_COMMUNITY): Admission: RE | Admit: 2020-03-07 | Payer: 59 | Source: Home / Self Care | Admitting: Orthopedic Surgery

## 2020-03-07 SURGERY — AMPUTATION, ABOVE KNEE
Anesthesia: Choice | Site: Knee | Laterality: Left

## 2020-03-11 ENCOUNTER — Other Ambulatory Visit: Payer: Self-pay | Admitting: Physician Assistant

## 2020-03-14 ENCOUNTER — Inpatient Hospital Stay: Payer: Medicare Other | Admitting: Physician Assistant

## 2020-03-18 ENCOUNTER — Other Ambulatory Visit: Payer: Self-pay

## 2020-03-18 ENCOUNTER — Other Ambulatory Visit: Payer: Self-pay | Admitting: Physician Assistant

## 2020-03-18 ENCOUNTER — Other Ambulatory Visit (HOSPITAL_COMMUNITY): Payer: 59

## 2020-03-18 ENCOUNTER — Encounter (HOSPITAL_COMMUNITY): Payer: Self-pay | Admitting: Orthopedic Surgery

## 2020-03-18 NOTE — Progress Notes (Signed)
Crystal Patterson denies chest pain or shortness of breath. Patient denies any s/s of Covid for herself or family; patient said that she had not been in contact with anyone with Covid. Crystal Zechman thought that the Covid testing Center was closed, I told her it was not the one that she needs to go to; patient said that she will go tomorrow.

## 2020-03-19 ENCOUNTER — Other Ambulatory Visit (HOSPITAL_COMMUNITY)
Admission: RE | Admit: 2020-03-19 | Discharge: 2020-03-19 | Disposition: A | Discharge status: Home / Self Care | Payer: 59 | Source: Ambulatory Visit | Attending: Orthopedic Surgery | Admitting: Orthopedic Surgery

## 2020-03-19 DIAGNOSIS — U071 COVID-19: Secondary | ICD-10-CM | POA: Diagnosis not present

## 2020-03-19 DIAGNOSIS — Z01812 Encounter for preprocedural laboratory examination: Secondary | ICD-10-CM | POA: Diagnosis present

## 2020-03-19 LAB — SARS CORONAVIRUS 2 (TAT 6-24 HRS): SARS Coronavirus 2: POSITIVE — AB

## 2020-03-20 ENCOUNTER — Telehealth: Payer: Self-pay

## 2020-03-20 NOTE — Telephone Encounter (Signed)
Called to discuss with patient about COVID-19 symptoms and the use of one of the available treatments for those with mild to moderate Covid symptoms and at a high risk of hospitalization.  Pt appears to qualify for outpatient treatment due to co-morbid conditions and/or a member of an at-risk group in accordance with the FDA Emergency Use Authorization.    Symptom onset: Unknown Vaccinated: Unknown Booster? Unknown Immunocompromised? No Qualifiers: Asthma,HTN, CAD  Unable to reach pt - Voice mailbox not set up, unable to leave message.   Crystal Patterson

## 2020-03-20 NOTE — Progress Notes (Signed)
Patient tested positive for COVID on 03/19/20, left message for surgery scheduler with dr duda

## 2020-03-20 NOTE — Progress Notes (Signed)
TC to Dr Lajoyce Corners to discuss patient's Covid status. Case cancelled, Tiffany at the OR desk notified, Dr Audrie Lia office responsible for contacting the patient

## 2020-03-21 ENCOUNTER — Inpatient Hospital Stay (HOSPITAL_COMMUNITY): Admission: RE | Admit: 2020-03-21 | Payer: 59 | Source: Home / Self Care | Admitting: Orthopedic Surgery

## 2020-03-21 HISTORY — DX: Heart failure, unspecified: I50.9

## 2020-03-21 HISTORY — DX: Gout, unspecified: M10.9

## 2020-03-21 HISTORY — DX: Gastro-esophageal reflux disease without esophagitis: K21.9

## 2020-03-21 SURGERY — AMPUTATION, ABOVE KNEE
Anesthesia: Choice | Site: Knee | Laterality: Left

## 2020-03-31 ENCOUNTER — Other Ambulatory Visit: Payer: Self-pay

## 2020-03-31 ENCOUNTER — Other Ambulatory Visit: Payer: Self-pay | Admitting: Physician Assistant

## 2020-04-01 DIAGNOSIS — I739 Peripheral vascular disease, unspecified: Secondary | ICD-10-CM | POA: Diagnosis not present

## 2020-04-01 DIAGNOSIS — Z96659 Presence of unspecified artificial knee joint: Secondary | ICD-10-CM | POA: Diagnosis not present

## 2020-04-01 DIAGNOSIS — I7300 Raynaud's syndrome without gangrene: Secondary | ICD-10-CM | POA: Diagnosis not present

## 2020-04-01 DIAGNOSIS — M329 Systemic lupus erythematosus, unspecified: Secondary | ICD-10-CM | POA: Diagnosis not present

## 2020-04-01 DIAGNOSIS — Z89029 Acquired absence of unspecified finger(s): Secondary | ICD-10-CM | POA: Diagnosis not present

## 2020-04-02 ENCOUNTER — Other Ambulatory Visit: Payer: Self-pay

## 2020-04-02 ENCOUNTER — Encounter (HOSPITAL_COMMUNITY): Payer: Self-pay | Admitting: Orthopedic Surgery

## 2020-04-02 NOTE — Anesthesia Preprocedure Evaluation (Deleted)
Anesthesia Evaluation    Airway        Dental   Pulmonary Current Smoker and Patient abstained from smoking.,           Cardiovascular hypertension,      Neuro/Psych    GI/Hepatic   Endo/Other    Renal/GU      Musculoskeletal   Abdominal   Peds  Hematology   Anesthesia Other Findings   Reproductive/Obstetrics                            Anesthesia Physical Anesthesia Plan  ASA:   Anesthesia Plan:    Post-op Pain Management:    Induction:   PONV Risk Score and Plan:   Airway Management Planned:   Additional Equipment:   Intra-op Plan:   Post-operative Plan:   Informed Consent:   Plan Discussed with:   Anesthesia Plan Comments: (PAT note by Karoline Caldwell, PA-C: History of NSTEMI (2018; normal coronaries on cath), HTN, severe LVH (by 2018 echo), mild mitral stenosis (by 2018 echo). Current smoker. Recent cardiac evaluation by Dr. Harl Bowie 01/02/20 prior to knee replacement. Per note 01/02/20, "Preoperative evaluation - considering knee replacement surgery - no active cardiopulmonary symptoms - exertion limited by chronic knee pain, cannot assess by history - extensive cardiac testing in 2018 including cath and echo that were benign - would recommend proceeding with surgery as planned, no repeat cardiac testing is indicated."  She will need day of surgery labs and evaluation.  EKG 01/02/2020 (per Dr. Nelly Laurence note same date): Sinus rhythm, nonspecific ST/T changes.  TTE 12/06/2016: - Left ventricle: The cavity size was normal. Wall thickness was  increased in a pattern of severe LVH. Systolic function was  vigorous. The estimated ejection fraction was in the range of 65%  to 70%. Wall motion was normal; there were no regional wall  motion abnormalities. Doppler parameters are consistent with  abnormal left ventricular relaxation (grade 1 diastolic  dysfunction). Doppler  parameters are consistent with high  ventricular filling pressure.  - Mitral valve: Severely calcified annulus. The findings are  consistent with mild stenosis. Valve area by pressure half-time:  1.68 cm^2. Valve area by continuity equation (using LVOT flow):  2.04 cm^2.  - Left atrium: The atrium was mildly dilated.  - Pulmonary arteries: PA peak pressure: 33 mm Hg (S).   Impressions:   - Vigorous LV systolic function; severe LVH; mild diastolic  dysfunction with elevated LV filling pressure; intracavitary  gradient of approximately 2.3 m/s; mild LAE; severe MAC with mild  MS.   Right/left heart cath 05/25/2016:  The left ventricular systolic function is normal. LV end diastolic pressure is normal. The left ventricular ejection fraction is greater than 65% by visual estimate.   Hyperdynamic LV function with LVH and an ejection fraction of approximately 70%.  Significant mitral annular calcification.  Normal coronary arteries.  Mild elevation of right heart pressures.  RECOMMENDATION: Medical therapy for chronic diastolic heart failure and hypertension.  )        Anesthesia Quick Evaluation

## 2020-04-02 NOTE — Progress Notes (Signed)
Anesthesia Chart Review: Same day workup  History of NSTEMI (2018; normal coronaries on cath), HTN, severe LVH (by 2018 echo), mild mitral stenosis (by 2018 echo). Current smoker. Recent cardiac evaluation by Dr. Harl Bowie 01/02/20 prior to knee replacement. Per note 01/02/20, "Preoperative evaluation - considering knee replacement surgery - no active cardiopulmonary symptoms - exertion limited by chronic knee pain, cannot assess by history - extensive cardiac testing in 2018 including cath and echo that were benign - would recommend proceeding with surgery as planned, no repeat cardiac testing is indicated."  She will need day of surgery labs and evaluation.  EKG 01/02/2020 (per Dr. Nelly Laurence note same date): Sinus rhythm, nonspecific ST/T changes.  TTE 12/06/2016: - Left ventricle: The cavity size was normal. Wall thickness was  increased in a pattern of severe LVH. Systolic function was  vigorous. The estimated ejection fraction was in the range of 65%  to 70%. Wall motion was normal; there were no regional wall  motion abnormalities. Doppler parameters are consistent with  abnormal left ventricular relaxation (grade 1 diastolic  dysfunction). Doppler parameters are consistent with high  ventricular filling pressure.  - Mitral valve: Severely calcified annulus. The findings are  consistent with mild stenosis. Valve area by pressure half-time:  1.68 cm^2. Valve area by continuity equation (using LVOT flow):  2.04 cm^2.  - Left atrium: The atrium was mildly dilated.  - Pulmonary arteries: PA peak pressure: 33 mm Hg (S).   Impressions:   - Vigorous LV systolic function; severe LVH; mild diastolic  dysfunction with elevated LV filling pressure; intracavitary  gradient of approximately 2.3 m/s; mild LAE; severe MAC with mild  MS.   Right/left heart cath 05/25/2016:   The left ventricular systolic function is normal.  LV end diastolic pressure is normal.  The left  ventricular ejection fraction is greater than 65% by visual estimate.   Hyperdynamic LV function with LVH and an ejection fraction of approximately 70%.  Significant mitral annular calcification.  Normal coronary arteries.  Mild elevation of right heart pressures.  RECOMMENDATION: Medical therapy for chronic diastolic heart failure and hypertension.    Wynonia Musty [facility] Short Stay Center/Anesthesiology Phone [phone] 04/02/2020 11:32 AM

## 2020-04-02 NOTE — Progress Notes (Signed)
PCP/Internal Med - Dr  Avon Gully Cardiologist - Dr Dina Rich  Chest x-ray - n/a EKG - 01/02/20 Stress Test - n/a ECHO - 12/06/16 Cardiac Cath - 3//27/18  Aspirin Instructions: Follow your surgeon's instructions on when to stop aspirin prior to surgery,  If no instructions were given by your surgeon then you will need to call the office for those instructions.  ERAS: Clear liquids til 5:45 am DOS.  No drink.  Anesthesia review: Yes  STOP now taking any Aspirin (unless otherwise instructed by your surgeon), Aleve, Naproxen, Ibuprofen, Motrin, Advil, Goody's, BC's, all herbal medications, fish oil, and all vitamins.   Coronavirus Screening Covid test on 03/19/20 was positive. Do you have any of the following symptoms:  Cough yes/no: No Fever (>100.58F)  yes/no: No Runny nose yes/no: No Sore throat yes/no: No Difficulty breathing/shortness of breath  yes/no: No  Have you traveled in the last 14 days and where? yes/no: No  Patient verbalized understanding of instructions that were given via phone.

## 2020-04-04 ENCOUNTER — Encounter (HOSPITAL_COMMUNITY)
Admission: RE | Disposition: A | Discharge status: Skilled Nursing Facility | Payer: Self-pay | Source: Home / Self Care | Attending: Orthopedic Surgery

## 2020-04-04 ENCOUNTER — Inpatient Hospital Stay (HOSPITAL_COMMUNITY): Payer: 59 | Admitting: Physician Assistant

## 2020-04-04 ENCOUNTER — Inpatient Hospital Stay (HOSPITAL_COMMUNITY)
Admission: RE | Admit: 2020-04-04 | Discharge: 2020-04-11 | DRG: 475 | Disposition: A | Discharge status: Skilled Nursing Facility | Payer: 59 | Attending: Orthopedic Surgery | Admitting: Orthopedic Surgery

## 2020-04-04 ENCOUNTER — Other Ambulatory Visit: Payer: Self-pay

## 2020-04-04 ENCOUNTER — Encounter (HOSPITAL_COMMUNITY): Payer: Self-pay | Admitting: Orthopedic Surgery

## 2020-04-04 DIAGNOSIS — L97128 Non-pressure chronic ulcer of left thigh with other specified severity: Secondary | ICD-10-CM | POA: Diagnosis not present

## 2020-04-04 DIAGNOSIS — T8459XS Infection and inflammatory reaction due to other internal joint prosthesis, sequela: Secondary | ICD-10-CM | POA: Diagnosis not present

## 2020-04-04 DIAGNOSIS — D62 Acute posthemorrhagic anemia: Secondary | ICD-10-CM | POA: Diagnosis not present

## 2020-04-04 DIAGNOSIS — K219 Gastro-esophageal reflux disease without esophagitis: Secondary | ICD-10-CM | POA: Diagnosis not present

## 2020-04-04 DIAGNOSIS — Y792 Prosthetic and other implants, materials and accessory orthopedic devices associated with adverse incidents: Secondary | ICD-10-CM | POA: Diagnosis present

## 2020-04-04 DIAGNOSIS — Z791 Long term (current) use of non-steroidal anti-inflammatories (NSAID): Secondary | ICD-10-CM | POA: Diagnosis not present

## 2020-04-04 DIAGNOSIS — I110 Hypertensive heart disease with heart failure: Secondary | ICD-10-CM | POA: Diagnosis present

## 2020-04-04 DIAGNOSIS — Z800 Family history of malignant neoplasm of digestive organs: Secondary | ICD-10-CM | POA: Diagnosis not present

## 2020-04-04 DIAGNOSIS — T8454XA Infection and inflammatory reaction due to internal left knee prosthesis, initial encounter: Principal | ICD-10-CM | POA: Diagnosis present

## 2020-04-04 DIAGNOSIS — T84018S Broken internal joint prosthesis, other site, sequela: Secondary | ICD-10-CM | POA: Diagnosis not present

## 2020-04-04 DIAGNOSIS — I252 Old myocardial infarction: Secondary | ICD-10-CM | POA: Diagnosis not present

## 2020-04-04 DIAGNOSIS — M1990 Unspecified osteoarthritis, unspecified site: Secondary | ICD-10-CM | POA: Diagnosis present

## 2020-04-04 DIAGNOSIS — Z20822 Contact with and (suspected) exposure to covid-19: Secondary | ICD-10-CM | POA: Diagnosis not present

## 2020-04-04 DIAGNOSIS — F17210 Nicotine dependence, cigarettes, uncomplicated: Secondary | ICD-10-CM | POA: Diagnosis not present

## 2020-04-04 DIAGNOSIS — I5032 Chronic diastolic (congestive) heart failure: Secondary | ICD-10-CM | POA: Diagnosis not present

## 2020-04-04 DIAGNOSIS — T8459XA Infection and inflammatory reaction due to other internal joint prosthesis, initial encounter: Secondary | ICD-10-CM

## 2020-04-04 DIAGNOSIS — Z79899 Other long term (current) drug therapy: Secondary | ICD-10-CM | POA: Diagnosis not present

## 2020-04-04 DIAGNOSIS — T8453XA Infection and inflammatory reaction due to internal right knee prosthesis, initial encounter: Secondary | ICD-10-CM | POA: Diagnosis not present

## 2020-04-04 DIAGNOSIS — Z7982 Long term (current) use of aspirin: Secondary | ICD-10-CM | POA: Diagnosis not present

## 2020-04-04 DIAGNOSIS — Z96652 Presence of left artificial knee joint: Secondary | ICD-10-CM

## 2020-04-04 DIAGNOSIS — I70241 Atherosclerosis of native arteries of left leg with ulceration of thigh: Secondary | ICD-10-CM | POA: Diagnosis not present

## 2020-04-04 DIAGNOSIS — M869 Osteomyelitis, unspecified: Secondary | ICD-10-CM | POA: Diagnosis present

## 2020-04-04 DIAGNOSIS — Z7401 Bed confinement status: Secondary | ICD-10-CM | POA: Diagnosis not present

## 2020-04-04 DIAGNOSIS — T8131XA Disruption of external operation (surgical) wound, not elsewhere classified, initial encounter: Secondary | ICD-10-CM | POA: Diagnosis not present

## 2020-04-04 DIAGNOSIS — M868X6 Other osteomyelitis, lower leg: Secondary | ICD-10-CM | POA: Diagnosis not present

## 2020-04-04 DIAGNOSIS — I10 Essential (primary) hypertension: Secondary | ICD-10-CM | POA: Diagnosis not present

## 2020-04-04 DIAGNOSIS — J45909 Unspecified asthma, uncomplicated: Secondary | ICD-10-CM | POA: Diagnosis present

## 2020-04-04 DIAGNOSIS — E7800 Pure hypercholesterolemia, unspecified: Secondary | ICD-10-CM | POA: Diagnosis present

## 2020-04-04 DIAGNOSIS — I739 Peripheral vascular disease, unspecified: Secondary | ICD-10-CM | POA: Diagnosis present

## 2020-04-04 DIAGNOSIS — Z743 Need for continuous supervision: Secondary | ICD-10-CM | POA: Diagnosis not present

## 2020-04-04 DIAGNOSIS — Z96659 Presence of unspecified artificial knee joint: Secondary | ICD-10-CM

## 2020-04-04 DIAGNOSIS — I050 Rheumatic mitral stenosis: Secondary | ICD-10-CM | POA: Diagnosis present

## 2020-04-04 DIAGNOSIS — M2550 Pain in unspecified joint: Secondary | ICD-10-CM | POA: Diagnosis not present

## 2020-04-04 DIAGNOSIS — M329 Systemic lupus erythematosus, unspecified: Secondary | ICD-10-CM | POA: Diagnosis not present

## 2020-04-04 HISTORY — PX: AMPUTATION: SHX166

## 2020-04-04 LAB — COMPREHENSIVE METABOLIC PANEL
ALT: 14 U/L (ref 0–44)
AST: 26 U/L (ref 15–41)
Albumin: 2.9 g/dL — ABNORMAL LOW (ref 3.5–5.0)
Alkaline Phosphatase: 89 U/L (ref 38–126)
Anion gap: 9 (ref 5–15)
BUN: 8 mg/dL (ref 6–20)
CO2: 24 mmol/L (ref 22–32)
Calcium: 9.1 mg/dL (ref 8.9–10.3)
Chloride: 102 mmol/L (ref 98–111)
Creatinine, Ser: 0.71 mg/dL (ref 0.44–1.00)
GFR, Estimated: >60 mL/min (ref >60)
Glucose, Bld: 98 mg/dL (ref 70–99)
Potassium: 3.5 mmol/L (ref 3.5–5.1)
Sodium: 135 mmol/L (ref 135–145)
Total Bilirubin: 1.1 mg/dL (ref 0.3–1.2)
Total Protein: 7.1 g/dL (ref 6.5–8.1)

## 2020-04-04 LAB — CBC
HCT: 35.6 % — ABNORMAL LOW (ref 36.0–46.0)
Hemoglobin: 10.7 g/dL — ABNORMAL LOW (ref 12.0–15.0)
MCH: 27 pg (ref 26.0–34.0)
MCHC: 30.1 g/dL (ref 30.0–36.0)
MCV: 89.7 fL (ref 80.0–100.0)
Platelets: 262 10*3/uL (ref 150–400)
RBC: 3.97 MIL/uL (ref 3.87–5.11)
RDW: 14.6 % (ref 11.5–15.5)
WBC: 7.9 10*3/uL (ref 4.0–10.5)
nRBC: 0 % (ref 0.0–0.2)

## 2020-04-04 SURGERY — AMPUTATION, ABOVE KNEE
Anesthesia: General | Site: Knee | Laterality: Left

## 2020-04-04 MED ORDER — PROPOFOL 10 MG/ML IV BOLUS
INTRAVENOUS | Status: AC
  Filled 2020-04-04: qty 60

## 2020-04-04 MED ORDER — ONDANSETRON HCL 4 MG/2ML IJ SOLN: 4.0000 mg | Freq: Once | INTRAMUSCULAR | Status: DC | PRN

## 2020-04-04 MED ORDER — ASPIRIN 81 MG PO CHEW
81.0000 mg | CHEWABLE_TABLET | Freq: Every day | ORAL | Status: DC
Start: 2020-04-04 — End: 2020-04-12
  Administered 2020-04-04 – 2020-04-11 (×8): 81 mg via ORAL
  Filled 2020-04-04 (×8): qty 1

## 2020-04-04 MED ORDER — ONDANSETRON HCL 4 MG/2ML IJ SOLN: 4.0000 mg | Freq: Four times a day (QID) | INTRAMUSCULAR | Status: DC | PRN

## 2020-04-04 MED ORDER — CHLORHEXIDINE GLUCONATE 0.12 % MT SOLN: 15.0000 mL | Freq: Once | OROMUCOSAL | Status: AC

## 2020-04-04 MED ORDER — 0.9 % SODIUM CHLORIDE (POUR BTL) OPTIME
TOPICAL | Status: DC | PRN
  Administered 2020-04-04: 1000 mL

## 2020-04-04 MED ORDER — ONDANSETRON HCL 4 MG PO TABS: 4.0000 mg | ORAL_TABLET | Freq: Four times a day (QID) | ORAL | Status: DC | PRN

## 2020-04-04 MED ORDER — CHLORHEXIDINE GLUCONATE 0.12 % MT SOLN
OROMUCOSAL | Status: AC
  Administered 2020-04-04: 15 mL via OROMUCOSAL
  Filled 2020-04-04: qty 15

## 2020-04-04 MED ORDER — HYDROMORPHONE HCL 1 MG/ML IJ SOLN
INTRAMUSCULAR | Status: AC
  Filled 2020-04-04: qty 1

## 2020-04-04 MED ORDER — METOCLOPRAMIDE HCL 5 MG PO TABS: 5.0000 mg | ORAL_TABLET | Freq: Three times a day (TID) | ORAL | Status: DC | PRN

## 2020-04-04 MED ORDER — GABAPENTIN 300 MG PO CAPS
300.0000 mg | ORAL_CAPSULE | Freq: Every day | ORAL | Status: DC
  Administered 2020-04-05 – 2020-04-11 (×7): 300 mg via ORAL
  Filled 2020-04-04 (×7): qty 1

## 2020-04-04 MED ORDER — PROPOFOL 10 MG/ML IV BOLUS
INTRAVENOUS | Status: DC | PRN
  Administered 2020-04-04: 140 mg via INTRAVENOUS

## 2020-04-04 MED ORDER — ORAL CARE MOUTH RINSE: 15.0000 mL | Freq: Once | OROMUCOSAL | Status: AC

## 2020-04-04 MED ORDER — ATENOLOL 25 MG PO TABS
25.0000 mg | ORAL_TABLET | Freq: Every day | ORAL | Status: DC
  Administered 2020-04-06 – 2020-04-07 (×2): 25 mg via ORAL
  Filled 2020-04-04 (×6): qty 1

## 2020-04-04 MED ORDER — OXYCODONE HCL 5 MG PO TABS
ORAL_TABLET | ORAL | Status: AC
  Filled 2020-04-04: qty 1

## 2020-04-04 MED ORDER — FENTANYL CITRATE (PF) 250 MCG/5ML IJ SOLN
INTRAMUSCULAR | Status: DC | PRN
  Administered 2020-04-04: 100 ug via INTRAVENOUS
  Administered 2020-04-04 (×3): 25 ug via INTRAVENOUS

## 2020-04-04 MED ORDER — AMLODIPINE BESYLATE 10 MG PO TABS
10.0000 mg | ORAL_TABLET | Freq: Every day | ORAL | Status: DC
  Administered 2020-04-06 – 2020-04-11 (×5): 10 mg via ORAL
  Filled 2020-04-04 (×6): qty 1

## 2020-04-04 MED ORDER — CEFAZOLIN SODIUM-DEXTROSE 2-4 GM/100ML-% IV SOLN
2.0000 g | INTRAVENOUS | Status: AC
  Administered 2020-04-04: 2 g via INTRAVENOUS

## 2020-04-04 MED ORDER — HYDROMORPHONE HCL 1 MG/ML IJ SOLN
0.2500 mg | INTRAMUSCULAR | Status: DC | PRN
  Administered 2020-04-04: 0.25 mg via INTRAVENOUS

## 2020-04-04 MED ORDER — FENTANYL CITRATE (PF) 250 MCG/5ML IJ SOLN
INTRAMUSCULAR | Status: AC
  Filled 2020-04-04: qty 5

## 2020-04-04 MED ORDER — HYDROMORPHONE HCL 1 MG/ML IJ SOLN
0.5000 mg | INTRAMUSCULAR | Status: DC | PRN
  Administered 2020-04-04 – 2020-04-10 (×3): 0.5 mg via INTRAVENOUS
  Filled 2020-04-04 (×2): qty 0.5

## 2020-04-04 MED ORDER — IBUPROFEN 800 MG PO TABS
800.0000 mg | ORAL_TABLET | Freq: Two times a day (BID) | ORAL | Status: DC
  Administered 2020-04-04 – 2020-04-11 (×15): 800 mg via ORAL
  Filled 2020-04-04 (×5): qty 1
  Filled 2020-04-04 (×3): qty 4
  Filled 2020-04-04 (×2): qty 1
  Filled 2020-04-04 (×3): qty 4
  Filled 2020-04-04: qty 1
  Filled 2020-04-04 (×2): qty 4
  Filled 2020-04-04: qty 1
  Filled 2020-04-04 (×2): qty 4
  Filled 2020-04-04: qty 1
  Filled 2020-04-04 (×3): qty 4
  Filled 2020-04-04 (×3): qty 1
  Filled 2020-04-04: qty 4
  Filled 2020-04-04 (×3): qty 1
  Filled 2020-04-04 (×2): qty 4

## 2020-04-04 MED ORDER — EPHEDRINE SULFATE-NACL 50-0.9 MG/10ML-% IV SOSY
PREFILLED_SYRINGE | INTRAVENOUS | Status: DC | PRN
  Administered 2020-04-04 (×2): 15 mg via INTRAVENOUS

## 2020-04-04 MED ORDER — BENAZEPRIL HCL 40 MG PO TABS
40.0000 mg | ORAL_TABLET | Freq: Every day | ORAL | Status: DC
Start: 2020-04-04 — End: 2020-04-12
  Administered 2020-04-07 – 2020-04-11 (×5): 40 mg via ORAL
  Filled 2020-04-04 (×2): qty 1
  Filled 2020-04-04: qty 2
  Filled 2020-04-04: qty 1
  Filled 2020-04-04: qty 2
  Filled 2020-04-04 (×2): qty 1
  Filled 2020-04-04: qty 2
  Filled 2020-04-04: qty 1
  Filled 2020-04-04: qty 2
  Filled 2020-04-04: qty 1
  Filled 2020-04-04 (×2): qty 2
  Filled 2020-04-04: qty 1

## 2020-04-04 MED ORDER — DOCUSATE SODIUM 100 MG PO CAPS
100.0000 mg | ORAL_CAPSULE | Freq: Two times a day (BID) | ORAL | Status: DC
  Administered 2020-04-04 – 2020-04-11 (×15): 100 mg via ORAL
  Filled 2020-04-04 (×15): qty 1

## 2020-04-04 MED ORDER — ATORVASTATIN CALCIUM 40 MG PO TABS
40.0000 mg | ORAL_TABLET | Freq: Every day | ORAL | Status: DC
  Administered 2020-04-05 – 2020-04-11 (×7): 40 mg via ORAL
  Filled 2020-04-04 (×7): qty 1

## 2020-04-04 MED ORDER — METHOCARBAMOL 500 MG PO TABS
500.0000 mg | ORAL_TABLET | Freq: Four times a day (QID) | ORAL | Status: DC | PRN
  Administered 2020-04-04 – 2020-04-09 (×8): 500 mg via ORAL
  Filled 2020-04-04 (×8): qty 1

## 2020-04-04 MED ORDER — ONDANSETRON HCL 4 MG/2ML IJ SOLN
INTRAMUSCULAR | Status: DC | PRN
  Administered 2020-04-04: 4 mg via INTRAVENOUS

## 2020-04-04 MED ORDER — POTASSIUM CHLORIDE CRYS ER 10 MEQ PO TBCR
10.0000 meq | EXTENDED_RELEASE_TABLET | Freq: Every day | ORAL | Status: DC
  Administered 2020-04-04 – 2020-04-11 (×7): 10 meq via ORAL
  Filled 2020-04-04 (×7): qty 1

## 2020-04-04 MED ORDER — ALLOPURINOL 100 MG PO TABS
100.0000 mg | ORAL_TABLET | Freq: Every day | ORAL | Status: DC
Start: 2020-04-05 — End: 2020-04-12
  Administered 2020-04-05 – 2020-04-11 (×7): 100 mg via ORAL
  Filled 2020-04-04 (×7): qty 1

## 2020-04-04 MED ORDER — LACTATED RINGERS IV SOLN: INTRAVENOUS | Status: DC

## 2020-04-04 MED ORDER — SODIUM CHLORIDE 0.9 % IV SOLN: INTRAVENOUS | Status: DC

## 2020-04-04 MED ORDER — LIDOCAINE 2% (20 MG/ML) 5 ML SYRINGE
INTRAMUSCULAR | Status: DC | PRN
  Administered 2020-04-04: 60 mg via INTRAVENOUS

## 2020-04-04 MED ORDER — PANTOPRAZOLE SODIUM 40 MG PO TBEC
40.0000 mg | DELAYED_RELEASE_TABLET | Freq: Every day | ORAL | Status: DC
  Administered 2020-04-05 – 2020-04-11 (×7): 40 mg via ORAL
  Filled 2020-04-04 (×7): qty 1

## 2020-04-04 MED ORDER — HYDROMORPHONE HCL 1 MG/ML IJ SOLN
INTRAMUSCULAR | Status: AC
  Administered 2020-04-04: 0.5 mg
  Filled 2020-04-04: qty 1

## 2020-04-04 MED ORDER — OXYCODONE HCL 5 MG PO TABS
5.0000 mg | ORAL_TABLET | ORAL | Status: DC | PRN
  Administered 2020-04-04: 5 mg via ORAL
  Administered 2020-04-05 (×2): 10 mg via ORAL
  Administered 2020-04-06: 5 mg via ORAL
  Administered 2020-04-06 – 2020-04-07 (×2): 10 mg via ORAL
  Administered 2020-04-07: 5 mg via ORAL
  Administered 2020-04-07: 10 mg via ORAL
  Administered 2020-04-08 (×4): 5 mg via ORAL
  Administered 2020-04-09 – 2020-04-11 (×4): 10 mg via ORAL
  Filled 2020-04-04: qty 1
  Filled 2020-04-04 (×2): qty 2
  Filled 2020-04-04: qty 1
  Filled 2020-04-04: qty 2
  Filled 2020-04-04: qty 1
  Filled 2020-04-04 (×3): qty 2
  Filled 2020-04-04: qty 1
  Filled 2020-04-04: qty 2
  Filled 2020-04-04: qty 1
  Filled 2020-04-04 (×2): qty 2
  Filled 2020-04-04: qty 1

## 2020-04-04 MED ORDER — FUROSEMIDE 40 MG PO TABS
40.0000 mg | ORAL_TABLET | Freq: Every day | ORAL | Status: DC
  Administered 2020-04-07 – 2020-04-11 (×5): 40 mg via ORAL
  Filled 2020-04-04 (×6): qty 1

## 2020-04-04 MED ORDER — SPIRONOLACTONE 25 MG PO TABS
25.0000 mg | ORAL_TABLET | Freq: Every day | ORAL | Status: DC
  Administered 2020-04-06 – 2020-04-11 (×6): 25 mg via ORAL
  Filled 2020-04-04 (×6): qty 1

## 2020-04-04 MED ORDER — CEFAZOLIN SODIUM-DEXTROSE 2-4 GM/100ML-% IV SOLN
INTRAVENOUS | Status: AC
  Filled 2020-04-04: qty 100

## 2020-04-04 MED ORDER — CEFAZOLIN SODIUM-DEXTROSE 1-4 GM/50ML-% IV SOLN
1.0000 g | Freq: Four times a day (QID) | INTRAVENOUS | Status: AC
  Administered 2020-04-04 – 2020-04-05 (×3): 1 g via INTRAVENOUS
  Filled 2020-04-04 (×3): qty 50

## 2020-04-04 MED ORDER — METOCLOPRAMIDE HCL 5 MG/ML IJ SOLN: 5.0000 mg | Freq: Three times a day (TID) | INTRAMUSCULAR | Status: DC | PRN

## 2020-04-04 MED ORDER — CHLORHEXIDINE GLUCONATE CLOTH 2 % EX PADS
6.0000 | MEDICATED_PAD | Freq: Every day | CUTANEOUS | Status: DC
  Administered 2020-04-05 – 2020-04-11 (×7): 6 via TOPICAL

## 2020-04-04 MED ORDER — ESCITALOPRAM OXALATE 10 MG PO TABS
10.0000 mg | ORAL_TABLET | Freq: Every day | ORAL | Status: DC
  Administered 2020-04-05 – 2020-04-11 (×7): 10 mg via ORAL
  Filled 2020-04-04 (×7): qty 1

## 2020-04-04 MED ORDER — SODIUM CHLORIDE 0.9% FLUSH: 10.0000 mL | INTRAVENOUS | Status: DC | PRN

## 2020-04-04 SURGICAL SUPPLY — 40 items
BLADE SAW RECIP 87.9 MT (BLADE) ×2 IMPLANT
BLADE SURG 21 STRL SS (BLADE) ×2 IMPLANT
BNDG COHESIVE 6X5 TAN NS LF (GAUZE/BANDAGES/DRESSINGS) ×1 IMPLANT
BNDG COHESIVE 6X5 TAN STRL LF (GAUZE/BANDAGES/DRESSINGS) ×2 IMPLANT
CANISTER WOUND CARE 500ML ATS (WOUND CARE) ×1 IMPLANT
COVER SURGICAL LIGHT HANDLE (MISCELLANEOUS) ×2 IMPLANT
COVER WAND RF STERILE (DRAPES) IMPLANT
DRAPE DERMATAC (DRAPES) ×2 IMPLANT
DRAPE INCISE IOBAN 66X45 STRL (DRAPES) ×4 IMPLANT
DRAPE U-SHAPE 47X51 STRL (DRAPES) ×2 IMPLANT
DRESSING PREVENA PLUS CUSTOM (GAUZE/BANDAGES/DRESSINGS) ×1 IMPLANT
DRSG PREVENA PLUS CUSTOM (GAUZE/BANDAGES/DRESSINGS) ×2
DURAPREP 26ML APPLICATOR (WOUND CARE) ×2 IMPLANT
ELECT REM PT RETURN 9FT ADLT (ELECTROSURGICAL) ×2
ELECTRODE REM PT RTRN 9FT ADLT (ELECTROSURGICAL) ×1 IMPLANT
GLOVE BIOGEL PI IND STRL 7.5 (GLOVE) ×1 IMPLANT
GLOVE BIOGEL PI IND STRL 9 (GLOVE) ×1 IMPLANT
GLOVE BIOGEL PI INDICATOR 7.5 (GLOVE) ×1
GLOVE BIOGEL PI INDICATOR 9 (GLOVE) ×1
GLOVE SURG ORTHO 9.0 STRL STRW (GLOVE) ×2 IMPLANT
GLOVE SURG SS PI 6.5 STRL IVOR (GLOVE) ×2 IMPLANT
GOWN STRL REUS W/ TWL LRG LVL3 (GOWN DISPOSABLE) ×1 IMPLANT
GOWN STRL REUS W/ TWL XL LVL3 (GOWN DISPOSABLE) ×2 IMPLANT
GOWN STRL REUS W/TWL LRG LVL3 (GOWN DISPOSABLE) ×2
GOWN STRL REUS W/TWL XL LVL3 (GOWN DISPOSABLE) ×4
KIT BASIN OR (CUSTOM PROCEDURE TRAY) ×2 IMPLANT
KIT TURNOVER KIT B (KITS) ×2 IMPLANT
MANIFOLD NEPTUNE II (INSTRUMENTS) ×2 IMPLANT
NS IRRIG 1000ML POUR BTL (IV SOLUTION) ×2 IMPLANT
PACK ORTHO EXTREMITY (CUSTOM PROCEDURE TRAY) ×2 IMPLANT
PAD ARMBOARD 7.5X6 YLW CONV (MISCELLANEOUS) ×2 IMPLANT
PREVENA RESTOR ARTHOFORM 46X30 (CANNISTER) ×2 IMPLANT
STAPLER VISISTAT 35W (STAPLE) IMPLANT
STOCKINETTE IMPERVIOUS LG (DRAPES) ×1 IMPLANT
SUT ETHILON 2 0 PSLX (SUTURE) ×4 IMPLANT
SUT SILK 2 0 (SUTURE) ×2
SUT SILK 2-0 18XBRD TIE 12 (SUTURE) ×1 IMPLANT
TOWEL GREEN STERILE FF (TOWEL DISPOSABLE) ×2 IMPLANT
TUBE CONNECTING 20X1/4 (TUBING) ×2 IMPLANT
YANKAUER SUCT BULB TIP NO VENT (SUCTIONS) ×2 IMPLANT

## 2020-04-04 NOTE — H&P (Signed)
Crystal Patterson is an 55 y.o. female.   Chief Complaint: Infected left knee Replacement HPI:  Patient is a 55 year old woman who is seen in follow-up for her left total knee.  Patient had wound dehiscence after her index surgery and underwent debridement local tissue rearrangement and skin grafting with a wound VAC for limb salvage.  Patient had slight wound breakdown on her last examination patient presents today with progressive wound dehiscence with exposed total joint.  Past Medical History:  Diagnosis Date  . Anginal pain (HCC) 2020  . Arthritis    Knees, hips,  . CHF (congestive heart failure) (HCC)   . Elevated troponin 05/24/2016  . Finger amputation, no complication   . GERD (gastroesophageal reflux disease)   . Gout   . Heart murmur    never has caused any problems per patient 04/02/20  . Hypercholesteremia   . Hypertension   . 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 Lupus [facility])     Past Surgical History:  Procedure Laterality Date  . CARDIAC CATHETERIZATION  05/25/2016  . COLONOSCOPY N/A 01/17/2019   Procedure: COLONOSCOPY;  Surgeon: Corbin Ade, MD;  Location: [facility] ENDO SUITE;  Service: Endoscopy;  Laterality: N/A;  10:30  . EXCISIONAL TOTAL KNEE ARTHROPLASTY Left 02/05/2020   Procedure: REPEAT IRRIGATION AND DEBRIDEMENT LEFT KNEE;  Surgeon: [name], MD;  Location: [facility] ORS;  Service: Orthopedics;  Laterality: Left;  . I & D EXTREMITY Left 02/08/2020   Procedure: EXCISIONAL DEBRIDEMENT LEFT KNEE;  Surgeon: Nadara Mustard, MD;  Location: [facility] OR;  Service: Orthopedics;  Laterality: Left;  . I & D KNEE WITH POLY EXCHANGE Left 01/25/2020   Procedure: INCISION AND DRAINAGE LEFT KNEE WITH POLY-LINER EXCHANGE;  Surgeon: [name], MD;  Location: [facility] ORS;  Service: Orthopedics;  Laterality: Left;  . IRRIGATION AND DEBRIDEMENT KNEE Left 01/30/2020   Procedure: REPEAT IRRIGATION AND DEBRIDEMENT LEFT KNEE PLACEMENT OF WOUND VAC;  Surgeon:  [name], MD;  Location: [facility] ORS;  Service: Orthopedics;  Laterality: Left;  . left finger amputations    . POLYPECTOMY  01/17/2019   Procedure: POLYPECTOMY;  Surgeon: Corbin Ade, MD;  Location: [facility];  Service: Endoscopy;;  . right hand surgery    . right knee arthroscopy    . RIGHT/LEFT HEART CATH AND CORONARY ANGIOGRAPHY N/A 05/25/2016   Procedure: Right/Left Heart Cath and Coronary Angiography;  Surgeon: Lennette Bihari, MD;  Location: [facility] INVASIVE CV LAB;  Service: Cardiovascular;  Laterality: N/A;  . SKIN SPLIT GRAFT Left 02/08/2020   Procedure: SKIN GRAFT SPLIT THICKNESS LEFT KNEE;  Surgeon: Nadara Mustard, MD;  Location: [facility] OR;  Service: Orthopedics;  Laterality: Left;  . TOTAL KNEE ARTHROPLASTY Left 01/04/2020   Procedure: LEFT TOTAL KNEE ARTHROPLASTY;  Surgeon: [name], MD;  Location: [facility] ORS;  Service: Orthopedics;  Laterality: Left;    Family History  Problem Relation Age of Onset  . Pancreatic cancer Mother   . Colon cancer Father    Social History:  reports that she has been smoking cigarettes. She has a 1.50 pack-year smoking history. She has never used smokeless tobacco. She reports current alcohol use of about 2.0 - 3.0 standard drinks of alcohol per week. She reports current drug use. Drug: Marijuana.  Allergies: No Known Allergies  Medications Prior to Admission  Medication Sig Dispense Refill  . allopurinol (ZYLOPRIM) 100 MG tablet Take 1 tablet (100 mg total) by mouth daily. 30 tablet  3  . amLODipine (NORVASC) 10 MG tablet Take 1 tablet (10 mg total) by mouth daily. 30 tablet 11  . aspirin 81 MG chewable tablet Chew 1 tablet (81 mg total) by mouth 2 (two) times daily. (Patient taking differently: Chew 81 mg by mouth daily.) 30 tablet 0  . atenolol (TENORMIN) 25 MG tablet Take 1 tablet (25 mg total) by mouth daily. 30 tablet 11  . atorvastatin (LIPITOR) 40 MG tablet Take 1 tablet (40 mg total) by mouth daily at 6 PM. (Patient  taking differently: Take 40 mg by mouth daily.) 30 tablet 11  . benazepril (LOTENSIN) 40 MG tablet Take 1 tablet (40 mg total) by mouth daily. 30 tablet 11  . escitalopram (LEXAPRO) 10 MG tablet Take 10 mg by mouth daily.    . furosemide (LASIX) 40 MG tablet Take 1 tablet (40 mg total) by mouth daily. 30 tablet 11  . gabapentin (NEURONTIN) 300 MG capsule Take 300 mg by mouth daily.    [name] ibuprofen (ADVIL,MOTRIN) 800 MG tablet Take 800 mg by mouth 2 (two) times daily.    . methocarbamol (ROBAXIN) 500 MG tablet Take 1 tablet (500 mg total) by mouth every 6 (six) hours as needed for muscle spasms. 40 tablet 1  . omeprazole (PRILOSEC) 20 MG capsule Take 20 mg by mouth daily.    . Oxycodone HCl 10 MG TABS Take 1 tablet (10 mg total) by mouth every 6 (six) hours as needed (pain score 7-10). 30 tablet 0  . potassium chloride (K-DUR) 10 MEQ tablet Take 10 mEq by mouth daily.     . silver sulfADIAZINE (SILVADENE) 1 % cream Apply 1 application topically daily. Apply to affected area daily plus dry dressing 400 g 3  . spironolactone (ALDACTONE) 25 MG tablet Take 25 mg by mouth daily.    [name] zolpidem (AMBIEN) 10 MG tablet Take 10 mg by mouth at bedtime as needed for sleep.       No results found for this or any previous visit (from the past 48 hour(s)). No results found.  Review of Systems  All other systems reviewed and are negative.   Height 5\' 4"  (1.626 m), weight 57.2 kg. Physical Exam   Patient is alert, oriented, no adenopathy, well-dressed, normal affect, normal respiratory effort. Examination there is progressive rapid wound dehiscence of the left total knee tissue rearrangement and wound closure with skin graft.  She now has a necrotic wound that is 13 x 5 cm the patella button is visible the tibial tray and total joint are visible through the necrotic wound.  The patella tendon is completely necrosed and patient has no active extensor function.  The patella is necrotic as well with no healthy  viable bone for the patella.Heart RRR Lungs Clear Assessment/Plan 1. Infection of total knee replacement, initial encounter (HCC)   2. Systemic lupus erythematosus arthritis (HCC)   3. Dehiscence of closure of skin, sequela     Plan: With the complete dehiscence and exposed total joint patient does not have limb salvage intervention options.  Will need to proceed with a above-the-knee amputation.  Discussed discharge planning with either discharge to home with home health nursing versus inpatient rehab versus skilled nursing placement.  Patient states she understands and wished to proceed with surgery on Friday.  Discussed the importance of nutrition and protein intake for wound healing.   Thursday Canaan Holzer, PA 04/04/2020, 6:36 AM

## 2020-04-04 NOTE — Interval H&P Note (Signed)
History and Physical Interval Note:  04/04/2020 7:06 AM  Crystal Patterson  has presented today for surgery, with the diagnosis of Dehiscence Infected Left Total Knee Arthroplasty.  The various methods of treatment have been discussed with the patient and family. After consideration of risks, benefits and other options for treatment, the patient has consented to  Procedure(s): LEFT ABOVE KNEE AMPUTATION (Left) as a surgical intervention.  The patient's history has been reviewed, patient examined, no change in status, stable for surgery.  I have reviewed the patient's chart and labs.  Questions were answered to the patient's satisfaction.     Nadara Mustard

## 2020-04-04 NOTE — Anesthesia Postprocedure Evaluation (Signed)
Anesthesia Post Note  Patient: Crystal Patterson  Procedure(s) Performed: LEFT ABOVE KNEE AMPUTATION (Left Knee)     Patient location during evaluation: PACU Anesthesia Type: General Level of consciousness: awake and alert Pain management: pain level controlled Vital Signs Assessment: post-procedure vital signs reviewed and stable Respiratory status: spontaneous breathing, nonlabored ventilation and respiratory function stable Cardiovascular status: blood pressure returned to baseline and stable Postop Assessment: no apparent nausea or vomiting Anesthetic complications: no   No complications documented.  Last Vitals:  Vitals:   04/04/20 1305 04/04/20 1335  BP: 91/79 100/74  Pulse: (!) 101 99  Resp: 14 11  Temp:    SpO2: 98% 100%    Last Pain:  Vitals:   04/04/20 1335  TempSrc:   PainSc: Asleep                 Lucretia Kern

## 2020-04-04 NOTE — Op Note (Signed)
04/04/2020  9:45 AM  PATIENT:  Crystal Patterson    PRE-OPERATIVE DIAGNOSIS:  Dehiscence Infected Left Total Knee Arthroplasty  POST-OPERATIVE DIAGNOSIS:  Same  PROCEDURE:  LEFT ABOVE KNEE AMPUTATION  SURGEON:  Nadara Mustard, MD  PHYSICIAN ASSISTANT:None ANESTHESIA:   General  PREOPERATIVE INDICATIONS:  Crystal Patterson is a  55 y.o. female with a diagnosis of Dehiscence Infected Left Total Knee Arthroplasty who failed conservative measures and elected for surgical management.    The risks benefits and alternatives were discussed with the patient preoperatively including but not limited to the risks of infection, bleeding, nerve injury, cardiopulmonary complications, the need for revision surgery, among others, and the patient was willing to proceed.  OPERATIVE IMPLANTS: Kathrin Ruddy form and customizable wound VAC  @ENCIMAGES @  OPERATIVE FINDINGS: No infection at the level of amputation the above-knee amputation was extra-articular  OPERATIVE PROCEDURE: Patient was brought the operating room and underwent a general anesthetic.  After adequate levels anesthesia were obtained patient's left lower extremity was prepped using DuraPrep draped into a sterile field a timeout was called.  A fishmouth incision was made proximal to the total knee incision and proximal to the joint capsule.  This was carried down through muscle the femur was resected with a reciprocating saw.  The vascular bundle was clamped and suture ligated with 2-0 silk medially.  The sciatic nerve was pulled cut and allowed to retract.  The amputation was completed hemostasis was obtained the wound was irrigated with normal saline.  The deep and superficial fascia layer was closed using #1 Vicryl the skin was closed using 2-0 nylon and staples.  A customizable and form wound VAC was applied this was covered with dermatac.  This had a good suction fit patient was extubated taken the PACU in stable condition.   DISCHARGE  PLANNING:  Antibiotic duration: 24 hours  Weightbearing: Nonweightbearing on the left  Pain medication: Opioid pathway  Dressing care/ Wound VAC: Continue wound VAC for 1 week  Ambulatory devices: Walker  Discharge to: Anticipate discharge to skilled nursing.  Follow-up: In the office 1 week post operative.

## 2020-04-04 NOTE — Anesthesia Procedure Notes (Signed)
Procedure Name: LMA Insertion Date/Time: 04/04/2020 8:51 AM Performed by: Lucinda Dell, CRNA Pre-anesthesia Checklist: Patient identified, Emergency Drugs available, Suction available and Patient being monitored Patient Re-evaluated:Patient Re-evaluated prior to induction Oxygen Delivery Method: Circle System Utilized Preoxygenation: Pre-oxygenation with 100% oxygen Induction Type: IV induction Ventilation: Mask ventilation without difficulty LMA: LMA inserted LMA Size: 4.0 Number of attempts: 1 Placement Confirmation: positive ETCO2 Tube secured with: Tape Dental Injury: Teeth and Oropharynx as per pre-operative assessment

## 2020-04-04 NOTE — Plan of Care (Signed)

## 2020-04-04 NOTE — Transfer of Care (Signed)
Immediate Anesthesia Transfer of Care Note  Patient: Crystal Patterson  Procedure(s) Performed: LEFT ABOVE KNEE AMPUTATION (Left Knee)  Patient Location: PACU  Anesthesia Type:General  Level of Consciousness: awake, alert , oriented and patient cooperative  Airway & Oxygen Therapy: Patient Spontanous Breathing and Patient connected to face mask oxygen  Post-op Assessment: Report given to RN, Post -op Vital signs reviewed and stable and Patient moving all extremities  Post vital signs: Reviewed and stable  Last Vitals:  Vitals Value Taken Time  BP 109/85 04/04/20 0934  Temp 36.5 C 04/04/20 0935  Pulse 100 04/04/20 0940  Resp 12 04/04/20 0940  SpO2 100 % 04/04/20 0940  Vitals shown include unvalidated device data.  Last Pain:  Vitals:   04/04/20 0935  TempSrc:   PainSc: Asleep      Patients Stated Pain Goal: 3 (04/04/20 0709)  Complications: No complications documented.

## 2020-04-04 NOTE — Anesthesia Preprocedure Evaluation (Signed)
Anesthesia Evaluation  Patient identified by MRN, date of birth, ID band Patient awake    Reviewed: Allergy & Precautions, H&P , NPO status , Patient's Chart, lab work & pertinent test results, reviewed documented beta blocker date and time   Airway Mallampati: I  TM Distance: >3 FB Neck ROM: Full    Dental  (+) Teeth Intact   Pulmonary asthma , Current Smoker and Patient abstained from smoking.,    Pulmonary exam normal        Cardiovascular hypertension, Pt. on medications and Pt. on home beta blockers + angina + Past MI and + Peripheral Vascular Disease  + Valvular Problems/Murmurs  Rhythm:Regular Rate:Normal     Neuro/Psych  Headaches, PSYCHIATRIC DISORDERS Depression negative neurological ROS     GI/Hepatic Neg liver ROS, GERD  Medicated,  Endo/Other  negative endocrine ROS  Renal/GU negative Renal ROS  negative genitourinary   Musculoskeletal  (+) Arthritis , Left knee infection s/p TKR 11/21   Abdominal (+)  Abdomen: soft. Bowel sounds: normal.  Peds negative pediatric ROS (+)  Hematology negative hematology ROS (+)   Anesthesia Other Findings  Lupus  Reproductive/Obstetrics negative OB ROS                             Anesthesia Physical  Anesthesia Plan  ASA: III  Anesthesia Plan: General   Post-op Pain Management:    Induction: Intravenous  PONV Risk Score and Plan: 2 and Ondansetron, Dexamethasone and Treatment may vary due to age or medical condition  Airway Management Planned: LMA  Additional Equipment: None  Intra-op Plan:   Post-operative Plan: Extubation in OR  Informed Consent: I have reviewed the patients History and Physical, chart, labs and discussed the procedure including the risks, benefits and alternatives for the proposed anesthesia with the patient or authorized representative who has indicated his/her understanding and acceptance.        Plan Discussed with: CRNA and Anesthesiologist  Anesthesia Plan Comments:         Anesthesia Quick Evaluation

## 2020-04-04 NOTE — Evaluation (Signed)
Physical Therapy Evaluation Patient Details Name: Crystal Patterson MRN: [id number] DOB: 1965-05-13 Today's Date: 04/04/2020   History of Present Illness  55 y.o. female s/p initial L TKA on 01/04/20, admitted 01/25/20 with drainage from L knee incision concerning for infection. S/p L knee I&Ds on 11/26, 12/1 and 12/6. Returned to OR 12/10 for further debridement of patellar wound and skin graft. PMH includes arthritis, HTN, bilateral finger amputations. Patient now s/p L AKA on 2/4 due to progressive wound dehiscence with exposed total joint.  Clinical Impression  PTA, patient living with sister and reports ambulating with RW and requiring assistance from sister for ADLs. Patient had recent fall 3 days ago which caused wound dehiscence leading to procedure above. Patient minA for supine>sit and light minA for sit to stand from EOB with RW. Patient ambulated total of 8' with RW and min guard, further ambulation deferred due to pain in residual limb. Patient presents with decreased activity tolerance, generalized weakness, impaired balance, and impaired functional mobility. Patient will benefit from skilled PT services during acute stay to address listed deficits. Recommend HHPT following discharge to maximize functional mobility and independence.     Follow Up Recommendations Home health PT;Supervision/Assistance - 24 hour    Equipment Recommendations  Rolling Belma Dyches with 5" wheels;3in1 (PT);Wheelchair (measurements PT);Wheelchair cushion (measurements PT)    Recommendations for Other Services       Precautions / Restrictions Precautions Precautions: Fall Precaution Comments: watch HR initially Restrictions Weight Bearing Restrictions: Yes LLE Weight Bearing: Non weight bearing      Mobility  Bed Mobility Overal bed mobility: Needs Assistance Bed Mobility: Supine to Sit;Sit to Supine     Supine to sit: Min assist Sit to supine: Min guard   General bed mobility comments: minA supine>sit  for support and advancement of LLE. Patient able to return to bed with min guard    Transfers Overall transfer level: Needs assistance Equipment used: Rolling Jamaal Bernasconi (2 wheeled) Transfers: Sit to/from Stand Sit to Stand: Min assist         General transfer comment: light minA for boost up to standing, cues for hand placement  Ambulation/Gait Ambulation/Gait assistance: Min guard Gait Distance (Feet): 8 Feet Assistive device: Rolling Zymere Patlan (2 wheeled) Gait Pattern/deviations: Step-to pattern ("hop to")     General Gait Details: patient able to complete 4' fwd/4' bwd with min guard for balance. deferred further ambulation due to pain  Stairs            Wheelchair Mobility    Modified Rankin (Stroke Patients Only)       Balance Overall balance assessment: Needs assistance Sitting-balance support: No upper extremity supported;Feet supported Sitting balance-Leahy Scale: Fair     Standing balance support: Bilateral upper extremity supported;During functional activity Standing balance-Leahy Scale: Poor Standing balance comment: reliant on UE support                             Pertinent Vitals/Pain Pain Assessment: Faces Faces Pain Scale: Hurts even more Pain Location: L residual limb Pain Descriptors / Indicators: Grimacing Pain Intervention(s): Monitored during session;Repositioned    Home Living Family/patient expects to be discharged to:: Private residence Living Arrangements: Other relatives (Sister) Available Help at Discharge: Family;Available 24 hours/day Type of Home: House Home Access: Stairs to enter Entrance Stairs-Rails: None Entrance Stairs-Number of Steps: 2 Home Layout: One level Home Equipment: Insurance underwriter - 2 wheels;Jusiah Aguayo - 4 wheels;Cane - single point;Shower seat;Toilet riser Additional  Comments: Pt has been staying with sister since TKR    Prior Function Level of Independence: Needs assistance   Gait /  Transfers Assistance Needed: pt using RW since TKR  ADL's / Homemaking Assistance Needed: reports assistance from sister to complete ADLs  Comments: reports fall 3 days ago which caused the dehiscence     Hand Dominance        Extremity/Trunk Assessment   Upper Extremity Assessment Upper Extremity Assessment: Defer to OT evaluation    Lower Extremity Assessment Lower Extremity Assessment: Generalized weakness       Communication   Communication: No difficulties  Cognition Arousal/Alertness: Awake/alert Behavior During Therapy: WFL for tasks assessed/performed Overall Cognitive Status: Within Functional Limits for tasks assessed                                        General Comments      Exercises     Assessment/Plan    PT Assessment Patient needs continued PT services  PT Problem List Decreased strength;Decreased range of motion;Decreased activity tolerance;Decreased balance;Decreased mobility       PT Treatment Interventions DME instruction;Gait training;Stair training;Functional mobility training;Therapeutic exercise;Therapeutic activities;Balance training;Patient/family education;Wheelchair mobility training    PT Goals (Current goals can be found in the Care Plan section)  Acute Rehab PT Goals Patient Stated Goal: to get better PT Goal Formulation: With patient Time For Goal Achievement: 04/18/20 Potential to Achieve Goals: Good    Frequency Min 3X/week   Barriers to discharge        Co-evaluation               AM-PAC PT "6 Clicks" Mobility  Outcome Measure Help needed turning from your back to your side while in a flat bed without using bedrails?: A Little Help needed moving from lying on your back to sitting on the side of a flat bed without using bedrails?: A Little Help needed moving to and from a bed to a chair (including a wheelchair)?: A Little Help needed standing up from a chair using your arms (e.g., wheelchair or  bedside chair)?: A Little Help needed to walk in hospital room?: A Little Help needed climbing 3-5 steps with a railing? : A Lot 6 Click Score: 17    End of Session Equipment Utilized During Treatment: Gait belt Activity Tolerance: Patient limited by pain Patient left: in bed;with call bell/phone within reach Nurse Communication: Mobility status PT Visit Diagnosis: Unsteadiness on feet (R26.81);Muscle weakness (generalized) (M62.81);Other abnormalities of gait and mobility (R26.89)    Time: 8003-4917 PT Time Calculation (min) (ACUTE ONLY): 36 min   Charges:   PT Evaluation $PT Eval Low Complexity: 1 Low PT Treatments $Therapeutic Activity: 8-22 mins        Grainger Mccarley A. Dan Humphreys PT, DPT Acute Rehabilitation Services Pager [phone] Office [phone]   Elissa Lovett 04/04/2020, 5:24 PM

## 2020-04-05 ENCOUNTER — Encounter (HOSPITAL_COMMUNITY): Payer: Self-pay | Admitting: Orthopedic Surgery

## 2020-04-05 LAB — CBC
HCT: 22.9 % — ABNORMAL LOW (ref 36.0–46.0)
Hemoglobin: 6.9 g/dL — CL (ref 12.0–15.0)
MCH: 27.3 pg (ref 26.0–34.0)
MCHC: 30.1 g/dL (ref 30.0–36.0)
MCV: 90.5 fL (ref 80.0–100.0)
Platelets: 180 10*3/uL (ref 150–400)
RBC: 2.53 MIL/uL — ABNORMAL LOW (ref 3.87–5.11)
RDW: 15 % (ref 11.5–15.5)
WBC: 7.3 10*3/uL (ref 4.0–10.5)
nRBC: 0 % (ref 0.0–0.2)

## 2020-04-05 LAB — HEMOGLOBIN AND HEMATOCRIT, BLOOD
HCT: 27.5 % — ABNORMAL LOW (ref 36.0–46.0)
Hemoglobin: 8.6 g/dL — ABNORMAL LOW (ref 12.0–15.0)

## 2020-04-05 LAB — PREPARE RBC (CROSSMATCH)

## 2020-04-05 MED ORDER — SODIUM CHLORIDE 0.9% IV SOLUTION: Freq: Once | INTRAVENOUS | Status: DC

## 2020-04-05 NOTE — Progress Notes (Signed)
CRITICAL VALUE ALERT  Critical Value:  Hemoglobin 6.9  Date & Time Notied:  04/05/2020 0553  Provider Notified: Dr. Magnus Ivan  Orders Received/Actions taken: Transfuse 1 unit PRBC    Type and screen order placed.

## 2020-04-05 NOTE — Progress Notes (Signed)
Patient ID: Crystal Patterson, female   DOB: [birth year], 55 y.o.   MRN: [id number] Patient is postoperative day 1 left above-the-knee amputation there is no drainage in the wound VAC canister patient is comfortable this morning.  Plan for physical therapy progressive ambulation anticipate patient may require skilled nursing placement.

## 2020-04-05 NOTE — Progress Notes (Signed)
PT Cancellation Note  Patient Details Name: Crystal Patterson MRN: [id number] DOB: [birth year]   Cancelled Treatment:     Attmepted to see patient on 2 occasions. On both occasions she was getting blood and had low blood pressure. PT will follow up as able.     Dessie Coma PT DPT  04/05/2020, 2:30 PM

## 2020-04-05 NOTE — Progress Notes (Signed)
Yellow MEWS due to low blood pressure. Yellow MEWS vital signs initiated.  Peggye Ley, RN

## 2020-04-05 NOTE — Evaluation (Signed)
Occupational Therapy Evaluation Patient Details Name: Crystal Patterson MRN: [id number] DOB: Jun 05, 1965 Today's Date: 04/05/2020    History of Present Illness 55 y.o. female s/p initial L TKA on 01/04/20, admitted 01/25/20 with drainage from L knee incision concerning for infection. S/p L knee I&Ds on 11/26, 12/1 and 12/6. Returned to OR 12/10 for further debridement of patellar wound and skin graft. PMH includes arthritis, HTN, bilateral finger amputations. Patient now s/p L AKA on 2/4 due to progressive wound dehiscence with exposed total joint.   Clinical Impression   Pt states that at home she gets help with ADL from her sister for bathing and dressing and that her sister completes IADL. This is a big change and she has only needed this help since her initial TKA in November. Prior to that she was independent. Today she is limited by fatigue (Hb of 6.9 and getting blood transfusion during session) but she is still able to come EOB with min A and participate in seated grooming tasks. She is max A for LB ADL at this time and mod A for bathing/dressing in seated position. At this time, due to new AKA (she is very hopeful for prosthetic) and deconditioning recommending CIR level therapy to maximize safety and independence in ADL and transfers.     Follow Up Recommendations  CIR    Equipment Recommendations  Other (comment) (defer to next venue of care)    Recommendations for Other Services Rehab consult     Precautions / Restrictions Precautions Precautions: Fall Restrictions Weight Bearing Restrictions: Yes LLE Weight Bearing: Non weight bearing      Mobility Bed Mobility Overal bed mobility: Needs Assistance Bed Mobility: Supine to Sit;Sit to Supine     Supine to sit: Min assist (trunk elevation, use of rail) Sit to supine: Min assist (to pull hips back initially)   General bed mobility comments: Pt requires increased time and min A physical assist    Transfers                  General transfer comment: declined at this time - clainms that she will for    Balance Overall balance assessment: Needs assistance Sitting-balance support: No upper extremity supported;Feet supported Sitting balance-Leahy Scale: Fair                                     ADL either performed or assessed with clinical judgement   ADL Overall ADL's : Needs assistance/impaired Eating/Feeding: Set up;Sitting   Grooming: Set up;Sitting;Wash/dry hands;Wash/dry face;Oral care Grooming Details (indicate cue type and reason): EOB Upper Body Bathing: Moderate assistance;Sitting   Lower Body Bathing: Maximal assistance;Sitting/lateral leans   Upper Body Dressing : Minimal assistance;Sitting   Lower Body Dressing: Maximal assistance;Sit to/from stand   Toilet Transfer: Moderate assistance;BSC;RW   Toileting- Clothing Manipulation and Hygiene: Maximal assistance;Sitting/lateral lean       Functional mobility during ADLs: Moderate assistance;Rolling walker General ADL Comments: Pt fatigued today, getting blood infusion during session. Pt with decreased access to LB for ADL, generalized weakness     Vision         Perception     Praxis      Pertinent Vitals/Pain Pain Assessment: Faces Faces Pain Scale: Hurts a little bit Pain Location: L residual limb Pain Descriptors / Indicators: Grimacing Pain Intervention(s): Monitored during session;Repositioned;Other (comment) (educated on phantom limb pain and desensitization of residual limb)  Hand Dominance Right   Extremity/Trunk Assessment Upper Extremity Assessment Upper Extremity Assessment: Generalized weakness (many missing fingers at baseline)   Lower Extremity Assessment Lower Extremity Assessment: LLE deficits/detail LLE Deficits / Details: s/p AKA - wound vac in place       Communication Communication Communication: No difficulties   Cognition Arousal/Alertness: Awake/alert Behavior During  Therapy: WFL for tasks assessed/performed Overall Cognitive Status: Within Functional Limits for tasks assessed                                     General Comments  Pt getting blood infusion during session    Exercises     Shoulder Instructions      Home Living Family/patient expects to be discharged to:: Private residence Living Arrangements: Other relatives (Sister) Available Help at Discharge: Family;Available 24 hours/day Type of Home: House Home Access: Stairs to enter [employer] of Steps: 2 Entrance Stairs-Rails: None Home Layout: One level     Bathroom Shower/Tub: Chief Strategy Officer: Standard Bathroom Accessibility: No   Home Equipment: Insurance underwriter - 2 wheels;Walker - 4 wheels;Cane - single point;Shower seat;Toilet riser   Additional Comments: Pt has been staying with sister/daughter since TKR      Prior Functioning/Environment Level of Independence: Needs assistance  Gait / Transfers Assistance Needed: pt using RW since TKR ADL's / Homemaking Assistance Needed: reports assistance from sister to complete bathing/dressing ADLs even UB            OT Problem List: Impaired balance (sitting and/or standing);Pain;Decreased strength;Decreased safety awareness;Decreased knowledge of use of DME or AE;Decreased knowledge of precautions      OT Treatment/Interventions: Self-care/ADL training;DME and/or AE instruction;Therapeutic exercise;Energy conservation;Therapeutic activities;Patient/family education;Balance training (new AKA training)    OT Goals(Current goals can be found in the care plan section) Acute Rehab OT Goals Patient Stated Goal: to prepare for her prosthetic and be more independent OT Goal Formulation: With patient Time For Goal Achievement: 04/19/20 Potential to Achieve Goals: Good  OT Frequency: Min 2X/week   Barriers to D/C:    good support from sister and daughter/family        Co-evaluation              AM-PAC OT "6 Clicks" Daily Activity     Outcome Measure Help from another person eating meals?: None Help from another person taking care of personal grooming?: A Little Help from another person toileting, which includes using toliet, bedpan, or urinal?: A Lot Help from another person bathing (including washing, rinsing, drying)?: A Lot Help from another person to put on and taking off regular upper body clothing?: A Lot Help from another person to put on and taking off regular lower body clothing?: A Lot 6 Click Score: 15   End of Session Nurse Communication: Mobility status  Activity Tolerance: Patient limited by fatigue (low Hb this session (6.9)) Patient left: in bed;with call bell/phone within reach;with bed alarm set  OT Visit Diagnosis: Unsteadiness on feet (R26.81);Other abnormalities of gait and mobility (R26.89);History of falling (Z91.81);Muscle weakness (generalized) (M62.81);Pain Pain - Right/Left: Left Pain - part of body: Leg                Time: 8588-5027 OT Time Calculation (min): 22 min Charges:  OT General Charges $OT Visit: 1 Visit OT Evaluation $OT Eval Moderate Complexity: 1 Mod  Nyoka Cowden OTR/L Acute Rehabilitation Services Pager: [phone] Office:  [phone]  Evern Bio Janeli Lewison 04/05/2020, 2:20 PM

## 2020-04-06 LAB — BPAM RBC
Blood Product Expiration Date: 202203032359
ISSUE DATE / TIME: 202202050942
Unit Type and Rh: 6200

## 2020-04-06 LAB — CBC
HCT: 26.1 % — ABNORMAL LOW (ref 36.0–46.0)
Hemoglobin: 8.4 g/dL — ABNORMAL LOW (ref 12.0–15.0)
MCH: 28.7 pg (ref 26.0–34.0)
MCHC: 32.2 g/dL (ref 30.0–36.0)
MCV: 89.1 fL (ref 80.0–100.0)
Platelets: 164 10*3/uL (ref 150–400)
RBC: 2.93 MIL/uL — ABNORMAL LOW (ref 3.87–5.11)
RDW: 15.2 % (ref 11.5–15.5)
WBC: 7.6 10*3/uL (ref 4.0–10.5)
nRBC: 0 % (ref 0.0–0.2)

## 2020-04-06 LAB — TYPE AND SCREEN
ABO/RH(D): A POS
Antibody Screen: NEGATIVE
Unit division: 0

## 2020-04-06 NOTE — Progress Notes (Signed)
Physical Therapy Treatment Patient Details Name: Crystal Patterson MRN: [id number] DOB: 08/13/1965 Today's Date: 04/06/2020    History of Present Illness 55 y.o. female s/p initial L TKA on 01/04/20, admitted 01/25/20 with drainage from L knee incision concerning for infection. S/p L knee I&Ds on 11/26, 12/1 and 12/6. Returned to OR 12/10 for further debridement of patellar wound and skin graft. PMH includes arthritis, HTN, bilateral finger amputations. Patient now s/p L AKA on 2/4 due to progressive wound dehiscence with exposed total joint.    PT Comments    Continuing work on functional mobility and activity tolerance;  Crystal Patterson worked hard and participated very well, even in a considerable amount of pain; she is very motivated to be independent, and has the clear goal of getting a prosthesis and walking; Updated DC rec to CIR as she will benefit from intensive therapies and education to set her up the best we can for independence at home, and for her LLE prosthesis   Follow Up Recommendations  CIR     Equipment Recommendations  Rolling walker with 5" wheels;3in1 (PT);Wheelchair (measurements PT);Wheelchair cushion (measurements PT)    Recommendations for Other Services Rehab consult     Precautions / Restrictions Precautions Precautions: Fall Restrictions LLE Weight Bearing: Non weight bearing    Mobility  Bed Mobility Overal bed mobility: Needs Assistance Bed Mobility: Supine to Sit     Supine to sit: Min assist     General bed mobility comments: Pt requires increased time and min A physical assist  Transfers Overall transfer level: Needs assistance Equipment used: Rolling walker (2 wheeled) Transfers: Sit to/from [employer] Sit to Stand: Min assist Stand pivot transfers: Min assist       General transfer comment: Cues for hand placement and safety; lots of cues for scooting out to EOB to preposition; slow rise; Min assist and Mod cues for safety and  technique for pivot bed to [facility]  Ambulation/Gait Ambulation/Gait assistance: Min guard Gait Distance (Feet): 6 Feet (BSC to recliner) Assistive device: Rolling walker (2 wheeled) Gait Pattern/deviations: Step-to pattern ("hop to")     General Gait Details: Slow, but steady steps; good suport of self on RW   Stairs             Wheelchair Mobility    Modified Rankin (Stroke Patients Only)       Balance     Sitting balance-Leahy Scale: Fair       Standing balance-Leahy Scale: Poor                              Cognition Arousal/Alertness: Awake/alert Behavior During Therapy: WFL for tasks assessed/performed;Flat affect (likely due to pain; alos, pt told me she doesn't talk much) Overall Cognitive Status: Within Functional Limits for tasks assessed                                        Exercises Other Exercises Other Exercises: L Residual Limb bolstered unilateral bridging x10 Other Exercises: Bridging with RLE and L REsidual limb bolstered x10 Other Exercises: Standing L hip extension x5 (with cueing for upright trunk)    General Comments General comments (skin integrity, edema, etc.): Provided education re: exercises and poistioning in prep for getting a prosthesis, desensitization to help with pahntom pain and sensation; therex  focusing on hip extension strengthening  and hip flexor stretching; Recommend consulting with a Prosthetist for more pre-prostheis education; Pt a bit tearful during session -- will consult Spiritual Care re: this major life transition, Sam, RN in agreement      Pertinent Vitals/Pain Pain Assessment: 0-10 Pain Score: 9  Pain Location: L residual limb; phantom pain in foot as well Pain Descriptors / Indicators: Grimacing Pain Intervention(s): Monitored during session;Patient requesting pain meds-RN notified    Home Living                      Prior Function            PT Goals (current goals  can now be found in the care plan section) Acute Rehab PT Goals Patient Stated Goal: to prepare for her prosthetic and be more independent PT Goal Formulation: With patient Time For Goal Achievement: 04/18/20 Potential to Achieve Goals: Good Progress towards PT goals: Progressing toward goals    Frequency    Min 3X/week      PT Plan Discharge plan needs to be updated    Co-evaluation              AM-PAC PT "6 Clicks" Mobility   Outcome Measure  Help needed turning from your back to your side while in a flat bed without using bedrails?: A Little Help needed moving from lying on your back to sitting on the side of a flat bed without using bedrails?: A Little Help needed moving to and from a bed to a chair (including a wheelchair)?: A Little Help needed standing up from a chair using your arms (e.g., wheelchair or bedside chair)?: A Little Help needed to walk in hospital room?: A Little Help needed climbing 3-5 steps with a railing? : A Lot 6 Click Score: 17    End of Session Equipment Utilized During Treatment: Gait belt Activity Tolerance: Patient tolerated treatment well (even though she was in quite a lot of pain) Patient left: in chair;with call bell/phone within reach;with chair alarm set Nurse Communication: Mobility status PT Visit Diagnosis: Unsteadiness on feet (R26.81);Muscle weakness (generalized) (M62.81);Other abnormalities of gait and mobility (R26.89)     Time: 6378-5885 PT Time Calculation (min) (ACUTE ONLY): 39 min  Charges:  $Gait Training: 8-22 mins $Therapeutic Exercise: 8-22 mins $Therapeutic Activity: 8-22 mins                     Van Clines, PT  Acute Rehabilitation Services Pager [phone] Office [phone]    Levi Aland 04/06/2020, 10:03 AM

## 2020-04-06 NOTE — Progress Notes (Signed)
Inpatient Rehab Admissions Coordinator Note:   Per therapy recommendations, pt was screened for CIR candidacy by Megan Salon, MS CCC-SLP. Current payor trends with pt.'s insurer indicate that they will not approve CIR for this diagnosis (AKA). I will not pursue CIR consult at this time. Pt. Would likely benefit from short term rehab at [facility].  Please contact me with questions.   Megan Salon, MS, CCC-SLP Rehab Admissions Coordinator  [phone] (celll) [phone] (office)

## 2020-04-06 NOTE — Progress Notes (Signed)
Chaplain responded to spiritual care consult to attend to likely grief due to atk amputation.  Pt was sitting up in bed, very alert and responsive, though she tends to agree with whatever is said.  She also smiled some and told chaplain that her support system included her daughter, granddaughter and that she also had 5 great-grandchildren.  Chaplain asked about pt's physical and emotional status.  Pt mostly responded, Fine, but acknowledged the need to pay attention to her feelings during what this time of adjustment.  She welcomed prayer.    Please contact for further support as needed.  Theodoro Parma [phone]       04/06/20 1500  Clinical Encounter Type  Visited With Patient  Visit Type Initial;Spiritual support;Psychological support  Referral From Other (Comment) (PT)  Consult/Referral To Chaplain  Spiritual Encounters  Spiritual Needs Prayer  Stress Factors  Patient Stress Factors Major life changes

## 2020-04-06 NOTE — Progress Notes (Signed)
Subjective: 2 Days Post-Op Procedure(s) (LRB): LEFT ABOVE KNEE AMPUTATION (Left) Patient reports pain as moderate.  Her hemoglobin did improve with the 1 unit of blood that was transfused yesterday.  According to the notes, her insurance will not cover inpatient rehab.  She is likely more appropriate for short-term skilled nursing.  Objective: Vital signs in last 24 hours: Temp:  [98.1 F (36.7 C)-98.5 F (36.9 C)] 98.5 F (36.9 C) (02/06 1024) Pulse Rate:  [67-75] 67 (02/06 1024) Resp:  [16-18] 16 (02/06 0453) BP: (88-117)/(59-77) 117/77 (02/06 1024) SpO2:  [97 %-100 %] 100 % (02/06 1024)  Intake/Output from previous day: 02/05 0701 - 02/06 0700 In: 909 [I.V.:475; Blood:434] Out: -  Intake/Output this shift: Total I/O In: 240 [P.O.:240] Out: -   Recent Labs    04/04/20 0705 04/05/20 0352 04/05/20 1604 04/06/20 0341  HGB 10.7* 6.9* 8.6* 8.4*   Recent Labs    04/05/20 0352 04/05/20 1604 04/06/20 0341  WBC 7.3  --  7.6  RBC 2.53*  --  2.93*  HCT 22.9* 27.5* 26.1*  PLT 180  --  164   Recent Labs    04/04/20 0705  NA 135  K 3.5  CL 102  CO2 24  BUN 8  CREATININE 0.71  GLUCOSE 98  CALCIUM 9.1   No results for input(s): LABPT, INR in the last 72 hours.  Incision: dressing C/D/I   Assessment/Plan: 2 Days Post-Op Procedure(s) (LRB): LEFT ABOVE KNEE AMPUTATION (Left) Up with therapy Will likely need short-term skilled nursing placement.     [name] 04/06/2020, 2:12 PM

## 2020-04-07 LAB — SARS CORONAVIRUS 2 (TAT 6-24 HRS): SARS Coronavirus 2: NEGATIVE

## 2020-04-07 LAB — SURGICAL PATHOLOGY

## 2020-04-07 MED ORDER — OXYCODONE HCL 5 MG PO TABS: 5.0000 mg | ORAL_TABLET | ORAL | 0 refills | Status: DC | PRN

## 2020-04-07 NOTE — Progress Notes (Signed)
Physical Therapy Treatment Patient Details Name: Crystal Patterson MRN: [id number] DOB: [birth year] Today's Date: 04/07/2020    History of Present Illness 55 y.o. female s/p initial L TKA on 01/04/20, admitted 01/25/20 with drainage from L knee incision concerning for infection. S/p L knee I&Ds on 11/26, 12/1 and 12/6. Returned to OR 12/10 for further debridement of patellar wound and skin graft. PMH includes arthritis, HTN, bilateral finger amputations. Patient now s/p L AKA on 2/4 due to progressive wound dehiscence with exposed total joint.    PT Comments    Continuing work on functional mobility and activity tolerance;  Pt seemed to be down emotionally, not as energetic and interactive as last session; Asked not to work on transfers or gait, but agreeing to therapeutic exercises  Follow Up Recommendations  SNF;Other (comment) (Insurance does not cover CIR)     Equipment Recommendations  Rolling walker with 5" wheels;3in1 (PT);Wheelchair (measurements PT);Wheelchair cushion (measurements PT)    Recommendations for Other Services       Precautions / Restrictions Precautions Precautions: Fall Restrictions LLE Weight Bearing: Non weight bearing    Mobility  Bed Mobility Overal bed mobility: Needs Assistance Bed Mobility: Rolling Rolling: Supervision         General bed mobility comments: Supervision for management of lines; Pt made a great attempt to get into prone positioning for hip extension, got very close to full prone; able to push with her RLE to scoot up in the bed enough to reach the headboard and pull herself up  Transfers                    Ambulation/Gait                 Stairs             Wheelchair Mobility    Modified Rankin (Stroke Patients Only)       Balance                                            Cognition Arousal/Alertness: Awake/alert Behavior During Therapy: WFL for tasks assessed/performed Overall  Cognitive Status: Within Functional Limits for tasks assessed                                 General Comments: Seemed more sad this session; Did everything asked of her, just seemed less engaged      Exercises Other Exercises Other Exercises: L Residual Limb bolstered unilateral bridging x10 Other Exercises: Bridging with RLE and L REsidual limb bolstered x10 Other Exercises: L hip extension in semi-pronelying x10 Other Exercises: L hip extension in R sidelying x10, manual cueing for form Other Exercises: L hip abduction in R sidelying x10    General Comments        Pertinent Vitals/Pain Pain Assessment: 0-10 Pain Score: 7  Pain Location: L residual limb Pain Descriptors / Indicators: Grimacing;Discomfort;Guarding Pain Intervention(s): Monitored during session;Premedicated before session    Home Living                      Prior Function            PT Goals (current goals can now be found in the care plan section) Acute Rehab PT Goals Patient Stated Goal: to prepare for her  prosthetic and be more independent PT Goal Formulation: With patient Time For Goal Achievement: 04/18/20 Potential to Achieve Goals: Good Progress towards PT goals: Progressing toward goals    Frequency    Min 3X/week      PT Plan Current plan remains appropriate    Co-evaluation              AM-PAC PT "6 Clicks" Mobility   Outcome Measure  Help needed turning from your back to your side while in a flat bed without using bedrails?: A Little Help needed moving from lying on your back to sitting on the side of a flat bed without using bedrails?: A Little Help needed moving to and from a bed to a chair (including a wheelchair)?: A Little Help needed standing up from a chair using your arms (e.g., wheelchair or bedside chair)?: A Little Help needed to walk in hospital room?: A Little Help needed climbing 3-5 steps with a railing? : A Lot 6 Click Score: 17     End of Session   Activity Tolerance: Patient tolerated treatment well (even though she was in quite a lot of pain) Patient left: in bed;with call bell/phone within reach;with bed alarm set (bed in semi-chair postion for dinner) Nurse Communication: Mobility status PT Visit Diagnosis: Unsteadiness on feet (R26.81);Muscle weakness (generalized) (M62.81);Other abnormalities of gait and mobility (R26.89)     Time: 7616-0737 PT Time Calculation (min) (ACUTE ONLY): 24 min  Charges:  $Therapeutic Exercise: 8-22 mins $Therapeutic Activity: 8-22 mins                     Van Clines, PT  Acute Rehabilitation Services Pager [phone] Office [phone]    Levi Aland 04/07/2020, 7:42 PM

## 2020-04-07 NOTE — NC FL2 (Signed)
[place] MEDICAID FL2 LEVEL OF CARE SCREENING TOOL     IDENTIFICATION  Patient Name: Crystal Patterson Birthdate: Feb 12, 1966 Sex: female Admission Date (Current Location): 04/04/2020  [facility] and [place] Number:  Producer, television/film/video and Address:  [facility], [address] [place], [place] [address]      Provider Number: [id number]  Attending Physician Name and Address:  Nadara Mustard, MD  Relative Name and Phone Number:       Current Level of Care: Hospital Recommended Level of Care: Skilled Nursing Facility Prior Approval Number:    Date Approved/Denied:   PASRR Number: [id number]  Discharge Plan: SNF    Current Diagnoses: Patient Active Problem List   Diagnosis Date Noted  . Knee osteomyelitis, left (HCC) 04/04/2020  . Failed total knee arthroplasty, sequela   . Open knee wound, left, sequela   . Infection of total knee replacement (HCC) 01/25/2020  . 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 Thrombosis of left ulnar artery (HCC) 01/23/2020  . Status post total knee replacement 01/06/2020  . Status post total left knee replacement 01/06/2020  . Status post total knee replacement, left 01/04/2020  . Unilateral primary osteoarthritis, left knee 09/26/2019  . Unilateral primary osteoarthritis, right knee 09/26/2019  . Systemic lupus erythematosus arthritis (HCC) 09/12/2019  . Arthritis of both knees 09/12/2019  . CAD (coronary artery disease) 07/14/2017  . Tobacco use disorder 12/30/2016  . Metabolic bone disease 12/30/2016  . Marijuana use 12/30/2016  . Syncope 12/06/2016  . ETOH abuse 12/06/2016  . PAD (peripheral artery disease) (HCC) 12/06/2016  . LVH (left ventricular hypertrophy) 05/26/2016  . Elevated troponin 05/24/2016  . Pain, hand 01/01/2013  . History of lupus nephritis 03/23/2011  . Raynaud's phenomenon 03/23/2011  . HYPOKALEMIA 08/19/2008  . FATTY LIVER DISEASE 05/09/2008  . WEIGHT LOSS  03/28/2008  . RAYNAUDS SYNDROME 02/15/2008  . CARDIAC MURMUR 11/16/2007  . Systemic lupus erythematosus (HCC) 05/25/2006  . GOUT 02/02/2006  . DEPRESSION 02/02/2006  . MIGRAINE HEADACHE 02/02/2006  . Essential hypertension 02/02/2006  . ALLERGIC RHINITIS 02/02/2006  . Asthma 02/02/2006  . GERD 02/02/2006  . IBS 02/02/2006  . OVERACTIVE BLADDER 02/02/2006    Orientation RESPIRATION BLADDER Height & Weight     Self,Time,Situation,Place  Normal Continent Weight: 57.2 kg Height:  5\' 4"  (162.6 cm)  BEHAVIORAL SYMPTOMS/MOOD NEUROLOGICAL BOWEL NUTRITION STATUS      Continent Diet (refer to d/c summary)  AMBULATORY STATUS COMMUNICATION OF NEEDS Skin   Extensive Assist Verbally Surgical wounds (2/4 s/p LEFT ABOVE KNEE AMPUTATION, Praveena vac)                       Personal Care Assistance Level of Assistance  Bathing,Feeding,Dressing Bathing Assistance: Maximum assistance Feeding assistance: Independent Dressing Assistance: Maximum assistance     Functional Limitations Info  Sight,Hearing,Speech Sight Info: Adequate Hearing Info: Adequate Speech Info: Adequate    SPECIAL CARE FACTORS FREQUENCY  PT (By licensed PT),OT (By licensed OT)     PT Frequency: 5x/week evaluate and treat OT Frequency: 5x/week evaluate and treat            Contractures Contractures Info: Not present    Additional Factors Info  Code Status,Allergies Code Status Info: full code Allergies Info: : No Known Allergies           Current Medications (04/07/2020):  This is the current hospital active medication list Current Facility-Administered Medications  Medication Dose Route Frequency Provider Last Rate Last Admin  . 0.9 %  sodium chloride infusion (Manually program via Guardrails IV Fluids)   Intravenous Once [name], MD      . 0.9 %  sodium chloride infusion   Intravenous Continuous Persons, [place], [place] 75 mL/hr at 04/06/20 0531 New Bag at 04/06/20 0531  . allopurinol  (ZYLOPRIM) tablet 100 mg  100 mg Oral Daily Persons, [place], [place]   100 mg at 04/07/20 1002  . amLODipine (NORVASC) tablet 10 mg  10 mg Oral Daily Persons, [place], [place]   10 mg at 04/07/20 1002  . aspirin chewable tablet 81 mg  81 mg Oral Daily Persons, [place], [place]   81 mg at 04/07/20 1001  . atenolol (TENORMIN) tablet 25 mg  25 mg Oral Daily Persons, [place], [place]   25 mg at 04/07/20 1001  . atorvastatin (LIPITOR) tablet 40 mg  40 mg Oral Daily Persons, [place], [place]   40 mg at 04/07/20 1002  . benazepril (LOTENSIN) tablet 40 mg  40 mg Oral Daily Persons, [place], [place]   40 mg at 04/07/20 1002  . Chlorhexidine Gluconate Cloth 2 % PADS 6 each  6 each Topical Daily Nadara Mustard, MD   6 each at 04/07/20 0901  . docusate sodium (COLACE) capsule 100 mg  100 mg Oral BID Persons, [place], [place]   100 mg at 04/07/20 1002  . escitalopram (LEXAPRO) tablet 10 mg  10 mg Oral Daily Persons, [place], [place]   10 mg at 04/07/20 1002  . furosemide (LASIX) tablet 40 mg  40 mg Oral Daily Persons, [place], [place]   40 mg at 04/07/20 1003  . gabapentin (NEURONTIN) capsule 300 mg  300 mg Oral Daily Persons, [place], [place]   300 mg at 04/07/20 1001  . HYDROmorphone (DILAUDID) injection 0.5 mg  0.5 mg Intravenous Q4H PRN Persons, [place], [place]   0.5 mg at 04/04/20 1015  . ibuprofen (ADVIL) tablet 800 mg  800 mg Oral BID WC Persons, [place], [place]   800 mg at 04/07/20 0857  . methocarbamol (ROBAXIN) tablet 500 mg  500 mg Oral Q6H PRN Persons, [place], [place]   500 mg at 04/05/20 0105  . metoCLOPramide (REGLAN) tablet 5-10 mg  5-10 mg Oral Q8H PRN Persons, [place], [place]       Or  . metoCLOPramide (REGLAN) injection 5-10 mg  5-10 mg Intravenous Q8H PRN Persons, [place], [place]      . ondansetron [facility]) tablet 4 mg  4 mg Oral Q6H PRN Persons, [place], [place]       Or  . ondansetron [facility]) injection 4 mg  4 mg Intravenous Q6H PRN Persons, [place], [place]      . oxyCODONE (Oxy IR/ROXICODONE) immediate release tablet 5-10 mg   5-10 mg Oral Q4H PRN Persons, [place], [place]   10 mg at 04/07/20 1412  . pantoprazole (PROTONIX) EC tablet 40 mg  40 mg Oral Daily Persons, [place], [place]   40 mg at 04/07/20 1002  . potassium chloride (KLOR-CON) CR tablet 10 mEq  10 mEq Oral Daily Persons, [place], [place]   10 mEq at 04/07/20 1002  . sodium chloride flush (NS) 0.9 % injection 10-40 mL  10-40 mL Intracatheter PRN Nadara Mustard, MD      . spironolactone (ALDACTONE) tablet 25 mg  25 mg Oral Daily Persons, [place], [place]  25 mg at 04/07/20 1002     Discharge Medications: Please see discharge summary for a list of discharge medications.  Relevant Imaging Results:  Relevant Lab Results:   Additional Information SS# [id number]  Epifanio Lesches, RN

## 2020-04-07 NOTE — Discharge Summary (Signed)
Discharge Diagnoses:  Active Problems:   Infection of total knee replacement (HCC)   Failed total knee arthroplasty, sequela   Knee osteomyelitis, left (HCC)   Surgeries: Procedure(s): LEFT ABOVE KNEE AMPUTATION on 04/04/2020    Consultants:   Discharged Condition: Improved  Hospital Course: SYDNIE SIGMUND is an 55 y.o. female who was admitted 04/04/2020 with a chief complaint of Failed left Total Knee arthroplasty with a final diagnosis of Dehiscence Infected Left Total Knee Arthroplasty.  Patient was brought to the operating room on 04/04/2020 and underwent Procedure(s): LEFT ABOVE KNEE AMPUTATION.    Patient was given perioperative antibiotics:  Anti-infectives (From admission, onward)   Start     Dose/Rate Route Frequency Ordered Stop   04/04/20 1545  ceFAZolin (ANCEF) IVPB 1 g/50 mL premix        1 g 100 mL/hr over 30 Minutes Intravenous Every 6 hours 04/04/20 1513 04/05/20 0349   04/04/20 0646  ceFAZolin (ANCEF) 2-4 GM/100ML-% IVPB       Note to Pharmacy: Kathrene Bongo   : cabinet override      04/04/20 0646 04/04/20 0855   04/04/20 0645  ceFAZolin (ANCEF) IVPB 2g/100 mL premix        2 g 200 mL/hr over 30 Minutes Intravenous On call to O.R. 04/04/20 8527 04/04/20 0854    .  Patient was given sequential compression devices, early ambulation, and aspirin for DVT prophylaxis.  Recent vital signs:  Patient Vitals for the past 24 hrs:  BP Temp Temp src Pulse Resp SpO2  04/07/20 0451 113/71 98.6 F (37 C) Oral 65 16 96 %  04/06/20 2022 - 99 F (37.2 C) Oral - - -  04/06/20 2021 92/62 - - 62 16 100 %  04/06/20 1024 117/77 98.5 F (36.9 C) Oral 67 - 100 %  .  Recent laboratory studies: No results found.  Discharge Medications:   Allergies as of 04/07/2020   No Known Allergies     Medication List    STOP taking these medications   silver sulfADIAZINE 1 % cream Commonly known as: SILVADENE     TAKE these medications   allopurinol 100 MG tablet Commonly known as:  ZYLOPRIM Take 1 tablet (100 mg total) by mouth daily.   amLODipine 10 MG tablet Commonly known as: NORVASC Take 1 tablet (10 mg total) by mouth daily.   aspirin 81 MG chewable tablet Chew 1 tablet (81 mg total) by mouth 2 (two) times daily. What changed: when to take this   atenolol 25 MG tablet Commonly known as: TENORMIN Take 1 tablet (25 mg total) by mouth daily.   atorvastatin 40 MG tablet Commonly known as: LIPITOR Take 1 tablet (40 mg total) by mouth daily at 6 PM. What changed: when to take this   benazepril 40 MG tablet Commonly known as: LOTENSIN Take 1 tablet (40 mg total) by mouth daily.   escitalopram 10 MG tablet Commonly known as: LEXAPRO Take 10 mg by mouth daily.   furosemide 40 MG tablet Commonly known as: LASIX Take 1 tablet (40 mg total) by mouth daily.   gabapentin 300 MG capsule Commonly known as: NEURONTIN Take 300 mg by mouth daily.   ibuprofen 800 MG tablet Commonly known as: ADVIL Take 800 mg by mouth 2 (two) times daily.   methocarbamol 500 MG tablet Commonly known as: ROBAXIN Take 1 tablet (500 mg total) by mouth every 6 (six) hours as needed for muscle spasms.   omeprazole 20 MG capsule Commonly  known as: PRILOSEC Take 20 mg by mouth daily.   oxyCODONE 5 MG immediate release tablet Commonly known as: Oxy IR/ROXICODONE Take 1-2 tablets (5-10 mg total) by mouth every 4 (four) hours as needed for moderate pain (pain score 4-6). What changed:   medication strength  how much to take  when to take this  reasons to take this   potassium chloride 10 MEQ tablet Commonly known as: KLOR-CON Take 10 mEq by mouth daily.   spironolactone 25 MG tablet Commonly known as: ALDACTONE Take 25 mg by mouth daily.   zolpidem 10 MG tablet Commonly known as: AMBIEN Take 10 mg by mouth at bedtime as needed for sleep.       Diagnostic Studies: No results found.  Patient benefited maximally from their hospital stay and there were no  complications.     Disposition: Discharge disposition: 03-Skilled Nursing Facility      Discharge Instructions    Call MD / Call 911   Complete by: As directed    If you experience chest pain or shortness of breath, CALL 911 and be transported to the hospital emergency room.  If you develope a fever above 101 F, pus (white drainage) or increased drainage or redness at the wound, or calf pain, call your surgeon's office.   Constipation Prevention   Complete by: As directed    Drink plenty of fluids.  Prune juice may be helpful.  You may use a stool softener, such as Colace (over the counter) 100 mg twice a day.  Use MiraLax (over the counter) for constipation as needed.   Diet - low sodium heart healthy   Complete by: As directed    Increase activity slowly as tolerated   Complete by: As directed    Negative Pressure Wound Therapy - Incisional   Complete by: As directed    Show patient how to attach prevena vac      Follow-up Information    Valeria Krisko, [place] In 1 week.   Specialty: Orthopedic Surgery Contact information: [address] [place] [address] [phone]                Signed: [place] Jood Retana 04/07/2020, 7:22 AM

## 2020-04-07 NOTE — Progress Notes (Signed)
Occupational Therapy Treatment Patient Details Name: Crystal Patterson MRN: [id number] DOB: 09/09/1965 Today's Date: 04/07/2020    History of present illness 55 y.o. female s/p initial L TKA on 01/04/20, admitted 01/25/20 with drainage from L knee incision concerning for infection. S/p L knee I&Ds on 11/26, 12/1 and 12/6. Returned to OR 12/10 for further debridement of patellar wound and skin graft. PMH includes arthritis, HTN, bilateral finger amputations. Patient now s/p L AKA on 2/4 due to progressive wound dehiscence with exposed total joint.   OT comments  Pt making steady progress towards OT goals this session. Session focus on functional mobility as precursor to higher level BADLs and seated UB ADLs from EOB. Pt presents with improved activity tolerance able to complete x2 stand pivot transfer from EOB<>BSC with MIN A +1 with RW. Pt able to complete anterior pericare from [facility] with set- up assist via lateral leans. Pt additionally completed ~ 74ft of functional mobility up to [facility] and back towards middle of bed with MIN A +1. Noted pt now for SNF d/t pts insurance denying CIR, updated DC recs to reflect change in POC and will let OTR know. Pt would continue to benefit from skilled occupational therapy while admitted and after d/c to address the below listed limitations in order to improve overall functional mobility and facilitate independence with BADL participation. DC plan remains appropriate, will follow acutely per POC.     Follow Up Recommendations  SNF;Other (comment)    Equipment Recommendations  Other (comment) (defer to next venue of care)    Recommendations for Other Services      Precautions / Restrictions Precautions Precautions: Fall Restrictions Weight Bearing Restrictions: No LLE Weight Bearing: Non weight bearing       Mobility Bed Mobility Overal bed mobility: Needs Assistance Bed Mobility: Supine to Sit;Sit to Supine     Supine to sit: Min guard;HOB elevated Sit  to supine: HOB elevated   General bed mobility comments: minguard for line mgmt and safety, pt with very slow guarded movements, min guard to return to supine again mostly for line mgmt safety  Transfers Overall transfer level: Needs assistance Equipment used: Rolling walker (2 wheeled) Transfers: Sit to/from [employer] Sit to Stand: Min assist Stand pivot transfers: Min assist       General transfer comment: Cues for hand placement and safety; lots of cues for scooting out to EOB to preposition; slow to rise    Balance Overall balance assessment: Needs assistance Sitting-balance support: No upper extremity supported;Feet supported Sitting balance-Leahy Scale: Fair Sitting balance - Comments: sitting EOB for ADLs with no UE support for LOB   Standing balance support: Bilateral upper extremity supported;During functional activity Standing balance-Leahy Scale: Poor Standing balance comment: reliant on UE support                           ADL either performed or assessed with clinical judgement   ADL Overall ADL's : Needs assistance/impaired     Grooming: Oral care;Set up;Sitting;Wash/dry face Grooming Details (indicate cue type and reason): sitting EOB         Upper Body Dressing : Moderate assistance;Sitting Upper Body Dressing Details (indicate cue type and reason): to don new gown     Toilet Transfer: Minimal assistance;RW;Stand-pivot;Ambulation Toilet Transfer Details (indicate cue type and reason): MIN A for stand pivot transfer from EOB<>BSC, cues for hand placement and for safety, pt also able to complete ~ 5ft of  functional mobility to simulated higher level toilel transfer with MIN A +1 Toileting- Clothing Manipulation and Hygiene: Supervision/safety;Sitting/lateral lean Toileting - Clothing Manipulation Details (indicate cue type and reason): lateral leans from [facility]     Functional mobility during ADLs: Minimal assistance;Rolling  walker;Cueing for safety General ADL Comments: pt very motivated this session demonstrating improvements in activity tolerance with pt able to tolerate more transfer training, functional mobility and seated UB ADLs from EOB     Vision       Perception     Praxis      Cognition Arousal/Alertness: Awake/alert Behavior During Therapy: WFL for tasks assessed/performed Overall Cognitive Status: Within Functional Limits for tasks assessed                                          Exercises     Shoulder Instructions       General Comments VSS, continued education on rubbing residual limb as desensitization strategy to decrease phantom limb pain    Pertinent Vitals/ Pain       Pain Assessment: 0-10 Pain Score: 8  Faces Pain Scale: Hurts little more Pain Location: L residual limb Pain Descriptors / Indicators: Grimacing;Discomfort;Guarding Pain Intervention(s): Monitored during session;Repositioned;Premedicated before session  Home Living                                          Prior Functioning/Environment              Frequency  Min 2X/week        Progress Toward Goals  OT Goals(current goals can now be found in the care plan section)  Progress towards OT goals: Progressing toward goals  Acute Rehab OT Goals Patient Stated Goal: to prepare for her prosthetic and be more independent OT Goal Formulation: With patient Time For Goal Achievement: 04/19/20 Potential to Achieve Goals: Good  Plan Discharge plan remains appropriate;Frequency remains appropriate    Co-evaluation                 AM-PAC OT "6 Clicks" Daily Activity     Outcome Measure   Help from another person eating meals?: None Help from another person taking care of personal grooming?: A Little (set- up) Help from another person toileting, which includes using toliet, bedpan, or urinal?: A Little Help from another person bathing (including washing,  rinsing, drying)?: A Little Help from another person to put on and taking off regular upper body clothing?: None Help from another person to put on and taking off regular lower body clothing?: A Little 6 Click Score: 20    End of Session Equipment Utilized During Treatment: Gait belt;Rolling walker;Other (comment) (BSC)  OT Visit Diagnosis: Unsteadiness on feet (R26.81);Other abnormalities of gait and mobility (R26.89);History of falling (Z91.81);Muscle weakness (generalized) (M62.81);Pain Pain - Right/Left: Left Pain - part of body: Leg   Activity Tolerance Patient tolerated treatment well   Patient Left in bed;with call bell/phone within reach;with bed alarm set   Nurse Communication Mobility status;Other (comment) (peed in [facility])        Time: 1405-1440 OT Time Calculation (min): 35 min  Charges: OT General Charges $OT Visit: 1 Visit OT Treatments $Self Care/Home Management : 23-37 mins  Lenor Derrick., COTA/L Acute Rehabilitation Services [phone]  Pollyann Glen [facility] 04/07/2020, 3:22 PM

## 2020-04-07 NOTE — Plan of Care (Signed)
  Problem: Education: Goal: Knowledge of General Education information will improve Description: Including pain rating scale, medication(s)/side effects and non-pharmacologic comfort measures Outcome: Progressing   Problem: Clinical Measurements: Goal: Respiratory complications will improve Outcome: Progressing Note: On room air   Problem: Activity: Goal: Risk for activity intolerance will decrease Outcome: Progressing   Problem: Nutrition: Goal: Adequate nutrition will be maintained Outcome: Progressing   Problem: Coping: Goal: Level of anxiety will decrease Outcome: Progressing   Problem: Elimination: Goal: Will not experience complications related to urinary retention Outcome: Progressing   Problem: Safety: Goal: Ability to remain free from injury will improve Outcome: Progressing

## 2020-04-07 NOTE — Progress Notes (Signed)
Patient is postop day 3 status post left above-knee amputation.  She is lying in bed is alert awake.  Vital signs stable afebrile wound VAC is 0 cc in canister.  Plan for discharge to skilled nursing as soon as arrangements made.  I will consult transition of care team today.  Plan for discharge today or tomorrow

## 2020-04-07 NOTE — TOC Progression Note (Signed)
Transition of Care [facility]) - Progression Note    Patient Details  Name: Crystal Patterson MRN: [id number] Date of Birth: 1965-12-17  Transition of Care [facility]) CM/SW Contact  Epifanio Lesches, RN Phone Number: 04/07/2020, 4:38 PM  Clinical Narrative:   S/P L AKA 2/4 RNCM received consult for possible SNF placement at time of discharge. RNCM spoke with patient regarding PT recommendation of SNF placement at time of discharge. Patient reported that patient's she  is currently unable to care for self at home given patient's current physical needs and fall risk. Patient expressed understanding of PT recommendation and is agreeable to SNF placement at time of discharge if CIR not an option Patient without preference for SNF . RNCM discussed insurance authorization process and provided Medicare SNF ratings list. Patient expressed being hopeful for rehab and to feel better soon. No further questions reported at this time. RNCM to continue to follow and assist with discharge planning needs. .  Expected Discharge Plan: Skilled Nursing Facility Barriers to Discharge: No SNF bed,Insurance Authorization  Expected Discharge Plan and Services Expected Discharge Plan: Skilled Nursing Facility         Expected Discharge Date: 04/07/20                                     Social Determinants of Health (SDOH) Interventions    Readmission Risk Interventions No flowsheet data found.

## 2020-04-08 NOTE — Progress Notes (Signed)
Patient is status post above-knee amputation today 4.  She has 0 cc in the canister.  Wound VAC is not alarming but does not have any green checks.   Vital signs stable afebrile alert pleasant to exam.  Patient is agreeable to going to a nursing facility.   Plan for discharge to skilled nursing facility when bed available

## 2020-04-08 NOTE — TOC Progression Note (Signed)
Transition of Care [facility]) - Progression Note    Patient Details  Name: Crystal Patterson MRN: [id number] Date of Birth: February 16, 1966  Transition of Care [facility]) CM/SW Contact  Levada Schilling Phone Number: 04/08/2020, 4:32 PM  Clinical Narrative:    CSW faxed requested information for insurance auth.  TOC will continue to assist with disposition planning.   Expected Discharge Plan: Skilled Nursing Facility Barriers to Discharge: No SNF bed,Insurance Authorization  Expected Discharge Plan and Services Expected Discharge Plan: Skilled Nursing Facility         Expected Discharge Date: 04/07/20                                     Social Determinants of Health (SDOH) Interventions    Readmission Risk Interventions No flowsheet data found.

## 2020-04-08 NOTE — Care Management Important Message (Signed)
Important Message  Patient Details  Name: Crystal Patterson MRN: [id number] Date of Birth: March 26, 1965   Medicare Important Message Given:  Yes - Important Message mailed due to current National Emergency  Verbal consent obtained due to current National Emergency  Relationship to patient: Self Contact Name: Acquanetta Cabanilla Call Date: 04/08/20  Time: 1354 Phone: [phone] Outcome: No Answer/Busy Important Message mailed to: Patient address on file    Orson Aloe 04/08/2020, 1:54 PM

## 2020-04-09 NOTE — TOC Progression Note (Addendum)
Transition of Care [facility]) - Progression Note    Patient Details  Name: Crystal Patterson MRN: [id number] Date of Birth: 04-21-1965  Transition of Care [facility]) CM/SW Contact  Epifanio Lesches, RN Phone Number: 04/09/2020, 1:10 PM  Clinical Narrative:    SNF/ rehab authorization denied by insurance. Pt made aware. Appeal initiated by pt. Awaiting determination.  TOC team will continue to assist with disposition planning.   Expected Discharge Plan: Skilled Nursing Facility Barriers to Discharge: No SNF bed,Other (comment),SNF Authorization Denied  Expected Discharge Plan and Services Expected Discharge Plan: Skilled Nursing Facility         Expected Discharge Date: 04/07/20                                     Social Determinants of Health (SDOH) Interventions    Readmission Risk Interventions No flowsheet data found.

## 2020-04-09 NOTE — TOC Progression Note (Signed)
Transition of Care [facility]) - Progression Note    Patient Details  Name: Crystal Patterson MRN: [id number] Date of Birth: [birth year]  Transition of Care [facility]) CM/SW Contact  Levada Schilling Phone Number: 04/09/2020, 4:27 PM  Clinical Narrative:    Pt will have to contact insurance for appeal.  Phone number is [phone] option 2.  TOC team will continue to assist with disposition planning.   Expected Discharge Plan: Skilled Nursing Facility Barriers to Discharge: No SNF bed,Other (comment),SNF Authorization Denied  Expected Discharge Plan and Services Expected Discharge Plan: Skilled Nursing Facility         Expected Discharge Date: 04/07/20                                     Social Determinants of Health (SDOH) Interventions    Readmission Risk Interventions No flowsheet data found.

## 2020-04-09 NOTE — Discharge Summary (Signed)
Discharge Diagnoses:  Active Problems:   Infection of total knee replacement (HCC)   Failed total knee arthroplasty, sequela   Knee osteomyelitis, left (HCC)   Surgeries: Procedure(s): LEFT ABOVE KNEE AMPUTATION on 04/04/2020    Consultants:   Discharged Condition: Improved  Hospital Course: Crystal Patterson is an 55 y.o. female who was admitted 04/04/2020 with a chief complaint of Failed left knee replacement, with a final diagnosis of Dehiscence Infected Left Total Knee Arthroplasty.  Patient was brought to the operating room on 04/04/2020 and underwent Procedure(s): LEFT ABOVE KNEE AMPUTATION.    Patient was given perioperative antibiotics:  Anti-infectives (From admission, onward)   Start     Dose/Rate Route Frequency Ordered Stop   04/04/20 1545  ceFAZolin (ANCEF) IVPB 1 g/50 mL premix        1 g 100 mL/hr over 30 Minutes Intravenous Every 6 hours 04/04/20 1513 04/05/20 0349   04/04/20 0646  ceFAZolin (ANCEF) 2-4 GM/100ML-% IVPB       Note to Pharmacy: Kathrene Bongo   : cabinet override      04/04/20 0646 04/04/20 0855   04/04/20 0645  ceFAZolin (ANCEF) IVPB 2g/100 mL premix        2 g 200 mL/hr over 30 Minutes Intravenous On call to O.R. 04/04/20 9767 04/04/20 0854    .  Patient was given sequential compression devices, early ambulation, and aspirin for DVT prophylaxis.  Recent vital signs:  Patient Vitals for the past 24 hrs:  BP Temp Temp src Pulse Resp SpO2  04/09/20 0605 115/72 98.4 F (36.9 C) Oral 64 17 97 %  04/08/20 2141 115/74 98.4 F (36.9 C) Oral 62 18 100 %  04/08/20 1516 115/75 98.1 F (36.7 C) - (!) 56 17 100 %  04/08/20 1058 104/72 - - (!) 57 - -  04/08/20 1022 112/81 - - (!) 58 - -  04/08/20 1020 112/81 - - - - -  04/08/20 0800 117/73 98.6 F (37 C) Oral (!) 58 15 -  .  Recent laboratory studies: No results found.  Discharge Medications:   Allergies as of 04/09/2020   No Known Allergies     Medication List    STOP taking these medications    silver sulfADIAZINE 1 % cream Commonly known as: SILVADENE     TAKE these medications   allopurinol 100 MG tablet Commonly known as: ZYLOPRIM Take 1 tablet (100 mg total) by mouth daily.   amLODipine 10 MG tablet Commonly known as: NORVASC Take 1 tablet (10 mg total) by mouth daily.   aspirin 81 MG chewable tablet Chew 1 tablet (81 mg total) by mouth 2 (two) times daily. What changed: when to take this   atenolol 25 MG tablet Commonly known as: TENORMIN Take 1 tablet (25 mg total) by mouth daily.   atorvastatin 40 MG tablet Commonly known as: LIPITOR Take 1 tablet (40 mg total) by mouth daily at 6 PM. What changed: when to take this   benazepril 40 MG tablet Commonly known as: LOTENSIN Take 1 tablet (40 mg total) by mouth daily.   escitalopram 10 MG tablet Commonly known as: LEXAPRO Take 10 mg by mouth daily.   furosemide 40 MG tablet Commonly known as: LASIX Take 1 tablet (40 mg total) by mouth daily.   gabapentin 300 MG capsule Commonly known as: NEURONTIN Take 300 mg by mouth daily.   ibuprofen 800 MG tablet Commonly known as: ADVIL Take 800 mg by mouth 2 (two) times daily.  methocarbamol 500 MG tablet Commonly known as: ROBAXIN Take 1 tablet (500 mg total) by mouth every 6 (six) hours as needed for muscle spasms.   omeprazole 20 MG capsule Commonly known as: PRILOSEC Take 20 mg by mouth daily.   oxyCODONE 5 MG immediate release tablet Commonly known as: Oxy IR/ROXICODONE Take 1-2 tablets (5-10 mg total) by mouth every 4 (four) hours as needed for moderate pain (pain score 4-6). What changed:   medication strength  how much to take  when to take this  reasons to take this   potassium chloride 10 MEQ tablet Commonly known as: KLOR-CON Take 10 mEq by mouth daily.   spironolactone 25 MG tablet Commonly known as: ALDACTONE Take 25 mg by mouth daily.   zolpidem 10 MG tablet Commonly known as: AMBIEN Take 10 mg by mouth at bedtime as needed  for sleep.       Diagnostic Studies: No results found.  Patient benefited maximally from their hospital stay and there were no complications.     Disposition: Discharge disposition: 03-Skilled Nursing Facility      Discharge Instructions    Call MD / Call 911   Complete by: As directed    If you experience chest pain or shortness of breath, CALL 911 and be transported to the hospital emergency room.  If you develope a fever above 101 F, pus (white drainage) or increased drainage or redness at the wound, or calf pain, call your surgeon's office.   Constipation Prevention   Complete by: As directed    Drink plenty of fluids.  Prune juice may be helpful.  You may use a stool softener, such as Colace (over the counter) 100 mg twice a day.  Use MiraLax (over the counter) for constipation as needed.   Diet - low sodium heart healthy   Complete by: As directed    Increase activity slowly as tolerated   Complete by: As directed       Follow-up Information    Caleesi Kohl, [place], [place] In 1 week.   Specialty: Orthopedic Surgery Contact information: [address] [place] [address] [phone]                Signed: [place] Caryle Helgeson 04/09/2020, 7:19 AM

## 2020-04-09 NOTE — Plan of Care (Signed)

## 2020-04-09 NOTE — Progress Notes (Signed)
Physical Therapy Treatment Patient Details Name: Crystal Patterson MRN: [id number] DOB: 1965-03-29 Today's Date: 04/09/2020    History of Present Illness 55 y.o. female s/p initial L TKA on 01/04/20, admitted 01/25/20 with drainage from L knee incision concerning for infection. S/p L knee I&Ds on 11/26, 12/1 and 12/6. Returned to OR 12/10 for further debridement of patellar wound and skin graft. PMH includes arthritis, HTN, bilateral finger amputations. Patient now s/p L AKA on 2/4 due to progressive wound dehiscence with exposed total joint.    PT Comments    Continuing work on functional mobility and activity tolerance;  Pt needed lots of encouragement to get OOB; Opted to practice lateral scooting OOB to drop-arm recliner, in simulation of getting to wheelchair, to give [name] more options for mobility;   Noted insurance denial of SNF for rehab; Might need to find out more about home and how much assist sister can give at home      Follow Up Recommendations  SNF;Other (comment) (insurance did not approve SNF for rehab; appealing that decision)     Equipment Recommendations  Rolling walker with 5" wheels;3in1 (PT);Wheelchair (measurements PT);Wheelchair cushion (measurements PT)    Recommendations for Other Services       Precautions / Restrictions Precautions Precautions: Fall Restrictions LLE Weight Bearing: Non weight bearing    Mobility  Bed Mobility Overal bed mobility: Needs Assistance Bed Mobility: Supine to Sit     Supine to sit: Min guard;HOB elevated     General bed mobility comments: Cues for technique  Transfers Overall transfer level: Needs assistance   Transfers: Lateral/Scoot Transfers          Lateral/Scoot Transfers: Min assist General transfer comment: Cues for technique; slow moving and grimacing noted; tended to stay back, and did not use R foot as a pivot point; small, slow, inefficient scoots, but did not need heavy  assist  Ambulation/Gait                 Stairs             Wheelchair Mobility    Modified Rankin (Stroke Patients Only)       Balance                                            Cognition Arousal/Alertness: Awake/alert Behavior During Therapy: WFL for tasks assessed/performed Overall Cognitive Status: Within Functional Limits for tasks assessed                                 General Comments: Neededdc lots of encouragement to get OOB      Exercises      General Comments        Pertinent Vitals/Pain Pain Assessment: No/denies pain Faces Pain Scale: Hurts little more Pain Location: L residual limb Pain Descriptors / Indicators: Grimacing;Discomfort;Guarding Pain Intervention(s): Monitored during session    Home Living                      Prior Function            PT Goals (current goals can now be found in the care plan section) Acute Rehab PT Goals PT Goal Formulation: With patient Time For Goal Achievement: 04/18/20 Potential to Achieve Goals: Good Progress towards PT goals: Progressing  toward goals    Frequency    Min 3X/week      PT Plan Current plan remains appropriate;Other (comment) (Noted insurance denial of SNF for rehab; Might need to find out more about home and how much assist sister can give)    Co-evaluation              AM-PAC PT "6 Clicks" Mobility   Outcome Measure  Help needed turning from your back to your side while in a flat bed without using bedrails?: A Little Help needed moving from lying on your back to sitting on the side of a flat bed without using bedrails?: A Little Help needed moving to and from a bed to a chair (including a wheelchair)?: A Little Help needed standing up from a chair using your arms (e.g., wheelchair or bedside chair)?: A Little Help needed to walk in hospital room?: A Little Help needed climbing 3-5 steps with a railing? : A Lot 6 Click  Score: 17    End of Session   Activity Tolerance: Patient tolerated treatment well Patient left: in chair;with call bell/phone within reach;with chair alarm set Nurse Communication: Mobility status       Time: 4098-1191 PT Time Calculation (min) (ACUTE ONLY): 20 min  Charges:  $Therapeutic Activity: 8-22 mins                     Van Clines, PT  Acute Rehabilitation Services Pager [phone] Office [phone]    Crystal Patterson 04/09/2020, 4:02 PM

## 2020-04-09 NOTE — Progress Notes (Signed)
Patient is postop day 6 status post above-knee amputation.  Vital signs stable afebrile sleeping but easily awakens.  0 cc in the canister.  Wound VAC has no green checks but is still working.   Plan would be to discharge patient to skilled nursing as soon as bed available  With have wound VAC removed prior to discharge to SNF we will place order

## 2020-04-10 NOTE — Progress Notes (Signed)
Occupational Therapy Treatment Patient Details Name: MARKITTA AUSBURN MRN: [id number] DOB: 10-29-1965 Today's Date: 04/10/2020    History of present illness 55 y.o. female s/p initial L TKA on 01/04/20, admitted 01/25/20 with drainage from L knee incision concerning for infection. S/p L knee I&Ds on 11/26, 12/1 and 12/6. Returned to OR 12/10 for further debridement of patellar wound and skin graft. PMH includes arthritis, HTN, bilateral finger amputations. Patient now s/p L AKA on 2/4 due to progressive wound dehiscence with exposed total joint.   OT comments  Pt making good progress with functional goals. OT will continue to follow acutely to maximize level of function and safety  Follow Up Recommendations  SNF    Equipment Recommendations  Wheelchair (measurements OT);Wheelchair cushion (measurements OT);3 in 1 bedside commode;Other (comment) (TBD at next level of care)    Recommendations for Other Services      Precautions / Restrictions Precautions Precautions: Fall Precaution Comments: watch HR initially Restrictions Weight Bearing Restrictions: Yes LLE Weight Bearing: Non weight bearing       Mobility Bed Mobility Overal bed mobility: Needs Assistance Bed Mobility: Supine to Sit;Sit to Supine     Supine to sit: Min guard Sit to supine: Min guard      Transfers Overall transfer level: Needs assistance Equipment used: Rolling walker (2 wheeled)     Stand pivot transfers: Min assist       General transfer comment: verbal cues for hand placement    Balance Overall balance assessment: Needs assistance Sitting-balance support: No upper extremity supported;Feet supported Sitting balance-Leahy Scale: Fair     Standing balance support: Bilateral upper extremity supported;During functional activity Standing balance-Leahy Scale: Poor                             ADL either performed or assessed with clinical judgement   ADL Overall ADL's : Needs  assistance/impaired     Grooming: Wash/dry face;Wash/dry hands;Standing;Min guard Grooming Details (indicate cue type and reason): standing at [employer]         Upper Body Dressing : Min guard;Sitting Upper Body Dressing Details (indicate cue type and reason): to don new gown     Toilet Transfer: Minimal assistance;RW;Stand-pivot;Ambulation;Cueing for safety   Toileting- Clothing Manipulation and Hygiene: Sit to/from stand;Minimal assistance Toileting - Clothing Manipulation Details (indicate cue type and reason): min guard A with clothing mgt     Functional mobility during ADLs: Minimal assistance;Rolling walker;Cueing for safety       Vision Patient Visual Report: No change from baseline     Perception     Praxis      Cognition Arousal/Alertness: Awake/alert Behavior During Therapy: WFL for tasks assessed/performed Overall Cognitive Status: Within Functional Limits for tasks assessed                                          Exercises     Shoulder Instructions       General Comments      Pertinent Vitals/ Pain       Pain Assessment: Faces Faces Pain Scale: Hurts a little bit Pain Location: L residual limb Pain Descriptors / Indicators: Grimacing;Discomfort;Guarding Pain Intervention(s): Monitored during session;Repositioned  Home Living  Prior Functioning/Environment              Frequency  Min 2X/week        Progress Toward Goals  OT Goals(current goals can now be found in the care plan section)  Progress towards OT goals: Progressing toward goals     Plan Discharge plan remains appropriate;Frequency remains appropriate    Co-evaluation                 AM-PAC OT "6 Clicks" Daily Activity     Outcome Measure   Help from another person eating meals?: None Help from another person taking care of personal grooming?: A Little Help from another person toileting,  which includes using toliet, bedpan, or urinal?: A Little Help from another person bathing (including washing, rinsing, drying)?: A Little Help from another person to put on and taking off regular upper body clothing?: None Help from another person to put on and taking off regular lower body clothing?: A Little 6 Click Score: 20    End of Session Equipment Utilized During Treatment: Gait belt;Rolling walker;Other (comment) (BSC)  OT Visit Diagnosis: Unsteadiness on feet (R26.81);Other abnormalities of gait and mobility (R26.89);History of falling (Z91.81);Muscle weakness (generalized) (M62.81);Pain Pain - Right/Left: Left Pain - part of body: Leg   Activity Tolerance Patient tolerated treatment well   Patient Left in bed;with call bell/phone within reach;with bed alarm set   Nurse Communication          Time: 6203-5597 OT Time Calculation (min): 18 min  Charges: OT General Charges $OT Visit: 1 Visit OT Treatments $Self Care/Home Management : 8-22 mins     Galen Manila 04/10/2020, 3:36 PM

## 2020-04-10 NOTE — Progress Notes (Signed)
This is a pleasant 55 year old woman who is now postop day 7 status post left above-knee amputation.  She is sitting up in bed and is comfortable.   Vital signs stable afebrile canister has 0 cc in the drain.  Wound VAC was removed today.  She has well apposed wound edges sutures are in place no wound dehiscence no necrosis no ascending cellulitis swelling is overall well controlled   Patient hopefully will go up to skilled nursing today.  Will have daily dressing changes may cleanse area with antibacterial soap and water

## 2020-04-10 NOTE — TOC Progression Note (Signed)
Transition of Care [facility]) - Progression Note    Patient Details  Name: Crystal Patterson MRN: [id number] Date of Birth: [birth year]  [facility]) CM/SW Contact  Epifanio Lesches, RN Phone Number: 04/10/2020, 2:55 PM  Clinical Narrative:    Levester Fresh. Extended SNF bed offer and pt accepted. Insurance auth. / reference ID [id number] [employer], approved x 5 days  2//10-2/14, for continued stay review fax clinicals to   [phone].  Per [facility] admissions SNF bed will be available in the am.  [facility] team will continue to monitor and assist with TOC needs.    Expected Discharge Plan: Skilled Nursing Facility Barriers to Discharge: Insurance Authorization  Expected Discharge Plan and Services Expected Discharge Plan: Skilled Nursing Facility         Expected Discharge Date: 04/07/20                                     Social Determinants of Health (SDOH) Interventions    Readmission Risk Interventions No flowsheet data found.

## 2020-04-11 LAB — SARS CORONAVIRUS 2 (TAT 6-24 HRS): SARS Coronavirus 2: NEGATIVE

## 2020-04-11 MED ORDER — HEPARIN SOD (PORK) LOCK FLUSH 100 UNIT/ML IV SOLN
500.0000 [IU] | INTRAVENOUS | Status: AC | PRN
  Administered 2020-04-11: 500 [IU]
  Filled 2020-04-11: qty 5

## 2020-04-11 MED ORDER — MAGNESIUM CITRATE PO SOLN
1.0000 | Freq: Once | ORAL | Status: AC
  Administered 2020-04-11: 1 via ORAL
  Filled 2020-04-11: qty 296

## 2020-04-11 NOTE — TOC Transition Note (Addendum)
Transition of Care [facility]) - CM/SW Discharge Note   Patient Details  Name: JAZZALYN LOEWENSTEIN MRN: [id number] Date of Birth: 10-09-1965  Transition of Care [facility]) CM/SW Contact:  Carley Hammed, LCSWA Phone Number: 04/11/2020, 1:15 PM   Clinical Narrative:    Nurse to call report to [phone] Ask for 100 hall, Rm# [phone]   Final next level of care: Skilled Nursing Facility Barriers to Discharge: Barriers Resolved   Patient Goals and CMS Choice        Discharge Placement              Patient chooses bed at: [facility] Patient to be transferred to facility by: [facility] Name of family member notified: Sebastian Ache Patient and family notified of of transfer: 04/11/20  Discharge Plan and Services                                     Social Determinants of Health (SDOH) Interventions     Readmission Risk Interventions No flowsheet data found.

## 2020-04-11 NOTE — Plan of Care (Signed)
  Problem: Education: Goal: Knowledge of General Education information will improve Description: Including pain rating scale, medication(s)/side effects and non-pharmacologic comfort measures Outcome: Progressing   Problem: Nutrition: Goal: Adequate nutrition will be maintained Outcome: Progressing   

## 2020-04-11 NOTE — Progress Notes (Signed)
Patient has opted for appointment today.  She is comfortable in bed.   Vital signs stable afebrile dressing clean dry in place.   Patient will have to have a Covid test today as hers is expired.  I have ordered this.  Follow-up in our office in 1 week

## 2020-04-11 NOTE — Care Management Important Message (Signed)
Important Message  Patient Details  Name: Crystal Patterson MRN: [id number] Date of Birth: 01/30/1966   Medicare Important Message Given:  Yes - Important Message mailed due to current National Emergency   Verbal consent obtained due to current National Emergency  Relationship to patient: Self Contact Name: Theresea Trautmann Call Date: 04/11/20  Time: 1303 Phone: [phone] Outcome: No Answer/Busy Important Message mailed to: Patient address on file    Orson Aloe 04/11/2020, 1:03 PM

## 2020-04-11 NOTE — Progress Notes (Signed)
Report given to [facility] at [facility], All questions and concerns were satisfactorily answered.

## 2020-04-11 NOTE — Discharge Summary (Signed)
Discharge Diagnoses:  Active Problems:   Infection of total knee replacement (HCC)   Failed total knee arthroplasty, sequela   Knee osteomyelitis, left (HCC)   Surgeries: Procedure(s): LEFT ABOVE KNEE AMPUTATION on 04/04/2020    Consultants:   Discharged Condition: Improved  Hospital Course: LUCILLE WITTS is an 55 y.o. female who was admitted 04/04/2020 with a chief complaint of Infected left Knee replacement, with a final diagnosis of Dehiscence Infected Left Total Knee Arthroplasty.  Patient was brought to the operating room on 04/04/2020 and underwent Procedure(s): LEFT ABOVE KNEE AMPUTATION.    Patient was given perioperative antibiotics:  Anti-infectives (From admission, onward)   Start     Dose/Rate Route Frequency Ordered Stop   04/04/20 1545  ceFAZolin (ANCEF) IVPB 1 g/50 mL premix        1 g 100 mL/hr over 30 Minutes Intravenous Every 6 hours 04/04/20 1513 04/05/20 0349   04/04/20 0646  ceFAZolin (ANCEF) 2-4 GM/100ML-% IVPB       Note to Pharmacy: Kathrene Bongo   : cabinet override      04/04/20 0646 04/04/20 0855   04/04/20 0645  ceFAZolin (ANCEF) IVPB 2g/100 mL premix        2 g 200 mL/hr over 30 Minutes Intravenous On call to O.R. 04/04/20 [id number] 04/04/20 0854    .  Patient was given sequential compression devices, early ambulation, and aspirin for DVT prophylaxis.  Recent vital signs:  Patient Vitals for the past 24 hrs:  BP Temp Temp src Pulse Resp SpO2  04/11/20 0744 106/71 98.6 F (37 C) Oral (!) 57 - 100 %  04/11/20 0428 112/77 98 F (36.7 C) - (!) 58 17 100 %  04/10/20 1936 (!) 94/53 98.5 F (36.9 C) Oral 62 17 100 %  04/10/20 1300 99/70 98.7 F (37.1 C) Oral 66 - 99 %  .  Recent laboratory studies: No results found.  Discharge Medications:   Allergies as of 04/11/2020   No Known Allergies     Medication List    STOP taking these medications   silver sulfADIAZINE 1 % cream Commonly known as: SILVADENE     TAKE these medications   allopurinol  100 MG tablet Commonly known as: ZYLOPRIM Take 1 tablet (100 mg total) by mouth daily.   amLODipine 10 MG tablet Commonly known as: NORVASC Take 1 tablet (10 mg total) by mouth daily.   aspirin 81 MG chewable tablet Chew 1 tablet (81 mg total) by mouth 2 (two) times daily. What changed: when to take this   atenolol 25 MG tablet Commonly known as: TENORMIN Take 1 tablet (25 mg total) by mouth daily.   atorvastatin 40 MG tablet Commonly known as: LIPITOR Take 1 tablet (40 mg total) by mouth daily at 6 PM. What changed: when to take this   benazepril 40 MG tablet Commonly known as: LOTENSIN Take 1 tablet (40 mg total) by mouth daily.   escitalopram 10 MG tablet Commonly known as: LEXAPRO Take 10 mg by mouth daily.   furosemide 40 MG tablet Commonly known as: LASIX Take 1 tablet (40 mg total) by mouth daily.   gabapentin 300 MG capsule Commonly known as: NEURONTIN Take 300 mg by mouth daily.   ibuprofen 800 MG tablet Commonly known as: ADVIL Take 800 mg by mouth 2 (two) times daily.   methocarbamol 500 MG tablet Commonly known as: ROBAXIN Take 1 tablet (500 mg total) by mouth every 6 (six) hours as needed for muscle spasms.  omeprazole 20 MG capsule Commonly known as: PRILOSEC Take 20 mg by mouth daily.   oxyCODONE 5 MG immediate release tablet Commonly known as: Oxy IR/ROXICODONE Take 1-2 tablets (5-10 mg total) by mouth every 4 (four) hours as needed for moderate pain (pain score 4-6). What changed:   medication strength  how much to take  when to take this  reasons to take this   potassium chloride 10 MEQ tablet Commonly known as: KLOR-CON Take 10 mEq by mouth daily.   spironolactone 25 MG tablet Commonly known as: ALDACTONE Take 25 mg by mouth daily.   zolpidem 10 MG tablet Commonly known as: AMBIEN Take 10 mg by mouth at bedtime as needed for sleep.       Diagnostic Studies: No results found.  Patient benefited maximally from their  hospital stay and there were no complications.     Disposition: Discharge disposition: 03-Skilled Nursing Facility      Discharge Instructions    Call MD / Call 911   Complete by: As directed    If you experience chest pain or shortness of breath, CALL 911 and be transported to the hospital emergency room.  If you develope a fever above 101 F, pus (white drainage) or increased drainage or redness at the wound, or calf pain, call your surgeon's office.   Call MD / Call 911   Complete by: As directed    If you experience chest pain or shortness of breath, CALL 911 and be transported to the hospital emergency room.  If you develope a fever above 101 F, pus (white drainage) or increased drainage or redness at the wound, or calf pain, call your surgeon's office.   Constipation Prevention   Complete by: As directed    Drink plenty of fluids.  Prune juice may be helpful.  You may use a stool softener, such as Colace (over the counter) 100 mg twice a day.  Use MiraLax (over the counter) for constipation as needed.   Constipation Prevention   Complete by: As directed    Drink plenty of fluids.  Prune juice may be helpful.  You may use a stool softener, such as Colace (over the counter) 100 mg twice a day.  Use MiraLax (over the counter) for constipation as needed.   Diet - low sodium heart healthy   Complete by: As directed    Diet - low sodium heart healthy   Complete by: As directed    Increase activity slowly as tolerated   Complete by: As directed    Increase activity slowly as tolerated   Complete by: As directed       Follow-up Information    Ethelyne Erich, [place], [place] In 1 week.   Specialty: Orthopedic Surgery Contact information: [address] [place] [address] [phone]                Signed: [place] Bentleigh Waren 04/11/2020, 12:20 PM

## 2020-04-11 NOTE — Discharge Summary (Signed)
Discharge Diagnoses:  Active Problems:   Infection of total knee replacement (HCC)   Failed total knee arthroplasty, sequela   Knee osteomyelitis, left (HCC)   Surgeries: Procedure(s): LEFT ABOVE KNEE AMPUTATION on 04/04/2020    Consultants:   Discharged Condition: Improved  Hospital Course: Crystal Patterson is an 55 y.o. female who was admitted 04/04/2020 with a chief complaint of failed infected Knee, with a final diagnosis of Dehiscence Infected Left Total Knee Arthroplasty.  Patient was brought to the operating room on 04/04/2020 and underwent Procedure(s): LEFT ABOVE KNEE AMPUTATION.    Patient was given perioperative antibiotics:  Anti-infectives (From admission, onward)   Start     Dose/Rate Route Frequency Ordered Stop   04/04/20 1545  ceFAZolin (ANCEF) IVPB 1 g/50 mL premix        1 g 100 mL/hr over 30 Minutes Intravenous Every 6 hours 04/04/20 1513 04/05/20 0349   04/04/20 0646  ceFAZolin (ANCEF) 2-4 GM/100ML-% IVPB       Note to Pharmacy: Kathrene Bongo   : cabinet override      04/04/20 0646 04/04/20 0855   04/04/20 0645  ceFAZolin (ANCEF) IVPB 2g/100 mL premix        2 g 200 mL/hr over 30 Minutes Intravenous On call to O.R. 04/04/20 [id number] 04/04/20 0854    .  Patient was given sequential compression devices, early ambulation, and aspirin for DVT prophylaxis.  Recent vital signs:  Patient Vitals for the past 24 hrs:  BP Temp Temp src Pulse Resp SpO2  04/11/20 0428 112/77 98 F (36.7 C) - (!) 58 17 100 %  04/10/20 1936 (!) 94/53 98.5 F (36.9 C) Oral 62 17 100 %  04/10/20 1300 99/70 98.7 F (37.1 C) Oral 66 - 99 %  04/10/20 0738 96/69 - - (!) 51 18 100 %  04/10/20 0726 99/67 97.8 F (36.6 C) Oral (!) 50 18 100 %  .  Recent laboratory studies: No results found.  Discharge Medications:   Allergies as of 04/11/2020   No Known Allergies     Medication List    STOP taking these medications   silver sulfADIAZINE 1 % cream Commonly known as: SILVADENE      TAKE these medications   allopurinol 100 MG tablet Commonly known as: ZYLOPRIM Take 1 tablet (100 mg total) by mouth daily.   amLODipine 10 MG tablet Commonly known as: NORVASC Take 1 tablet (10 mg total) by mouth daily.   aspirin 81 MG chewable tablet Chew 1 tablet (81 mg total) by mouth 2 (two) times daily. What changed: when to take this   atenolol 25 MG tablet Commonly known as: TENORMIN Take 1 tablet (25 mg total) by mouth daily.   atorvastatin 40 MG tablet Commonly known as: LIPITOR Take 1 tablet (40 mg total) by mouth daily at 6 PM. What changed: when to take this   benazepril 40 MG tablet Commonly known as: LOTENSIN Take 1 tablet (40 mg total) by mouth daily.   escitalopram 10 MG tablet Commonly known as: LEXAPRO Take 10 mg by mouth daily.   furosemide 40 MG tablet Commonly known as: LASIX Take 1 tablet (40 mg total) by mouth daily.   gabapentin 300 MG capsule Commonly known as: NEURONTIN Take 300 mg by mouth daily.   ibuprofen 800 MG tablet Commonly known as: ADVIL Take 800 mg by mouth 2 (two) times daily.   methocarbamol 500 MG tablet Commonly known as: ROBAXIN Take 1 tablet (500 mg total) by mouth  every 6 (six) hours as needed for muscle spasms.   omeprazole 20 MG capsule Commonly known as: PRILOSEC Take 20 mg by mouth daily.   oxyCODONE 5 MG immediate release tablet Commonly known as: Oxy IR/ROXICODONE Take 1-2 tablets (5-10 mg total) by mouth every 4 (four) hours as needed for moderate pain (pain score 4-6). What changed:   medication strength  how much to take  when to take this  reasons to take this   potassium chloride 10 MEQ tablet Commonly known as: KLOR-CON Take 10 mEq by mouth daily.   spironolactone 25 MG tablet Commonly known as: ALDACTONE Take 25 mg by mouth daily.   zolpidem 10 MG tablet Commonly known as: AMBIEN Take 10 mg by mouth at bedtime as needed for sleep.       Diagnostic Studies: No results  found.  Patient benefited maximally from their hospital stay and there were no complications.     Disposition: Discharge disposition: 03-Skilled Nursing Facility      Discharge Instructions    Call MD / Call 911   Complete by: As directed    If you experience chest pain or shortness of breath, CALL 911 and be transported to the hospital emergency room.  If you develope a fever above 101 F, pus (white drainage) or increased drainage or redness at the wound, or calf pain, call your surgeon's office.   Call MD / Call 911   Complete by: As directed    If you experience chest pain or shortness of breath, CALL 911 and be transported to the hospital emergency room.  If you develope a fever above 101 F, pus (white drainage) or increased drainage or redness at the wound, or calf pain, call your surgeon's office.   Constipation Prevention   Complete by: As directed    Drink plenty of fluids.  Prune juice may be helpful.  You may use a stool softener, such as Colace (over the counter) 100 mg twice a day.  Use MiraLax (over the counter) for constipation as needed.   Constipation Prevention   Complete by: As directed    Drink plenty of fluids.  Prune juice may be helpful.  You may use a stool softener, such as Colace (over the counter) 100 mg twice a day.  Use MiraLax (over the counter) for constipation as needed.   Diet - low sodium heart healthy   Complete by: As directed    Diet - low sodium heart healthy   Complete by: As directed    Increase activity slowly as tolerated   Complete by: As directed    Increase activity slowly as tolerated   Complete by: As directed       Follow-up Information    Napoleon Monacelli, [place], [place] In 1 week.   Specialty: Orthopedic Surgery Contact information: [address] [place] [address] [phone]                Signed: [place] Daymond Cordts 04/11/2020, 7:01 AM

## 2020-04-12 DIAGNOSIS — M1990 Unspecified osteoarthritis, unspecified site: Secondary | ICD-10-CM | POA: Diagnosis not present

## 2020-04-12 DIAGNOSIS — R011 Cardiac murmur, unspecified: Secondary | ICD-10-CM | POA: Diagnosis not present

## 2020-04-12 DIAGNOSIS — Z96659 Presence of unspecified artificial knee joint: Secondary | ICD-10-CM | POA: Diagnosis not present

## 2020-04-12 DIAGNOSIS — I739 Peripheral vascular disease, unspecified: Secondary | ICD-10-CM | POA: Diagnosis not present

## 2020-04-12 DIAGNOSIS — M1A9XX0 Chronic gout, unspecified, without tophus (tophi): Secondary | ICD-10-CM | POA: Diagnosis not present

## 2020-04-12 DIAGNOSIS — M329 Systemic lupus erythematosus, unspecified: Secondary | ICD-10-CM | POA: Diagnosis not present

## 2020-04-12 DIAGNOSIS — F5101 Primary insomnia: Secondary | ICD-10-CM | POA: Diagnosis not present

## 2020-04-12 DIAGNOSIS — J45909 Unspecified asthma, uncomplicated: Secondary | ICD-10-CM | POA: Diagnosis not present

## 2020-04-12 DIAGNOSIS — T8459XS Infection and inflammatory reaction due to other internal joint prosthesis, sequela: Secondary | ICD-10-CM | POA: Diagnosis not present

## 2020-04-12 DIAGNOSIS — E785 Hyperlipidemia, unspecified: Secondary | ICD-10-CM | POA: Diagnosis not present

## 2020-04-12 DIAGNOSIS — Z89612 Acquired absence of left leg above knee: Secondary | ICD-10-CM | POA: Diagnosis not present

## 2020-04-12 DIAGNOSIS — G629 Polyneuropathy, unspecified: Secondary | ICD-10-CM | POA: Diagnosis not present

## 2020-04-12 DIAGNOSIS — Z4781 Encounter for orthopedic aftercare following surgical amputation: Secondary | ICD-10-CM | POA: Diagnosis not present

## 2020-04-12 DIAGNOSIS — M6281 Muscle weakness (generalized): Secondary | ICD-10-CM | POA: Diagnosis not present

## 2020-04-12 DIAGNOSIS — Z89029 Acquired absence of unspecified finger(s): Secondary | ICD-10-CM | POA: Diagnosis not present

## 2020-04-12 DIAGNOSIS — J069 Acute upper respiratory infection, unspecified: Secondary | ICD-10-CM | POA: Diagnosis not present

## 2020-04-12 DIAGNOSIS — G43909 Migraine, unspecified, not intractable, without status migrainosus: Secondary | ICD-10-CM | POA: Diagnosis not present

## 2020-04-12 DIAGNOSIS — M869 Osteomyelitis, unspecified: Secondary | ICD-10-CM | POA: Diagnosis not present

## 2020-04-12 DIAGNOSIS — I10 Essential (primary) hypertension: Secondary | ICD-10-CM | POA: Diagnosis not present

## 2020-04-12 DIAGNOSIS — I7300 Raynaud's syndrome without gangrene: Secondary | ICD-10-CM | POA: Diagnosis not present

## 2020-04-12 DIAGNOSIS — E7800 Pure hypercholesterolemia, unspecified: Secondary | ICD-10-CM | POA: Diagnosis not present

## 2020-04-12 DIAGNOSIS — M109 Gout, unspecified: Secondary | ICD-10-CM | POA: Diagnosis not present

## 2020-04-12 DIAGNOSIS — J309 Allergic rhinitis, unspecified: Secondary | ICD-10-CM | POA: Diagnosis not present

## 2020-04-12 DIAGNOSIS — I2510 Atherosclerotic heart disease of native coronary artery without angina pectoris: Secondary | ICD-10-CM | POA: Diagnosis not present

## 2020-04-12 DIAGNOSIS — Z9189 Other specified personal risk factors, not elsewhere classified: Secondary | ICD-10-CM | POA: Diagnosis not present

## 2020-04-12 DIAGNOSIS — K219 Gastro-esophageal reflux disease without esophagitis: Secondary | ICD-10-CM | POA: Diagnosis not present

## 2020-04-12 DIAGNOSIS — F339 Major depressive disorder, recurrent, unspecified: Secondary | ICD-10-CM | POA: Diagnosis not present

## 2020-04-12 DIAGNOSIS — I509 Heart failure, unspecified: Secondary | ICD-10-CM | POA: Diagnosis not present

## 2020-04-12 DIAGNOSIS — E8809 Other disorders of plasma-protein metabolism, not elsewhere classified: Secondary | ICD-10-CM | POA: Diagnosis not present

## 2020-04-12 NOTE — Progress Notes (Signed)
Patient transferd to [facility] by [facility]. Vital signs obtained. Pain medication administered.Discharged package given to [facility] associate.

## 2020-04-14 ENCOUNTER — Encounter: Payer: Self-pay | Admitting: Adult Health

## 2020-04-14 ENCOUNTER — Non-Acute Institutional Stay (SKILLED_NURSING_FACILITY): Payer: Medicare Other | Admitting: Adult Health

## 2020-04-14 ENCOUNTER — Telehealth: Payer: Self-pay | Admitting: Orthopedic Surgery

## 2020-04-14 DIAGNOSIS — I739 Peripheral vascular disease, unspecified: Secondary | ICD-10-CM | POA: Diagnosis not present

## 2020-04-14 DIAGNOSIS — M869 Osteomyelitis, unspecified: Secondary | ICD-10-CM | POA: Diagnosis not present

## 2020-04-14 DIAGNOSIS — I509 Heart failure, unspecified: Secondary | ICD-10-CM | POA: Diagnosis not present

## 2020-04-14 DIAGNOSIS — I10 Essential (primary) hypertension: Secondary | ICD-10-CM | POA: Diagnosis not present

## 2020-04-14 DIAGNOSIS — F339 Major depressive disorder, recurrent, unspecified: Secondary | ICD-10-CM

## 2020-04-14 NOTE — Progress Notes (Deleted)
   NURSING HOME LOCATION:  [facility] ROOM NUMBER:  [id number]  CODE STATUS:  Full Code  PCP:  Avon Gully, MD   This is a comprehensive admission note to [facility] performed on this date less than 30 days from date of admission. Included are preadmission medical/surgical history; reconciled medication list; family history; social history and comprehensive review of systems.  Corrections and additions to the records were documented. Comprehensive physical exam was also performed. Additionally a clinical summary was entered for each active diagnosis pertinent to this admission in the Problem List to enhance continuity of care.  HPI:  Past medical and surgical history:  Social history:  Family history:   Review of systems:  Could not be completed due to dementia. Date given as Constitutional: No fever, significant weight change, fatigue  Eyes: No redness, discharge, pain, vision change ENT/mouth: No nasal congestion, purulent discharge, earache, change in hearing, sore throat  Cardiovascular: No chest pain, palpitations, paroxysmal nocturnal dyspnea, claudication, edema  Respiratory: No cough, sputum production, hemoptysis, DOE, significant snoring, apnea Gastrointestinal: No heartburn, dysphagia, abdominal pain, nausea /vomiting, rectal bleeding, melena, change in bowels Genitourinary: No dysuria, hematuria, pyuria, incontinence, nocturia Musculoskeletal: No joint stiffness, joint swelling, weakness, pain Dermatologic: No rash, pruritus, change in appearance of skin Neurologic: No dizziness, headache, syncope, seizures, numbness, tingling Psychiatric: No significant anxiety, depression, insomnia, anorexia Endocrine: No change in hair/skin/nails, excessive thirst, excessive hunger, excessive urination  Hematologic/lymphatic: No significant bruising, lymphadenopathy, abnormal bleeding Allergy/immunology: No itchy/watery eyes, significant sneezing, urticaria,  angioedema  Physical exam:  Pertinent or positive findings: General appearance: Adequately nourished; no acute distress, increased work of breathing is present.   Lymphatic: No lymphadenopathy about the head, neck, axilla. Eyes: No conjunctival inflammation or lid edema is present. There is no scleral icterus. Ears:  External ear exam shows no significant lesions or deformities.   Nose:  External nasal examination shows no deformity or inflammation. Nasal mucosa are pink and moist without lesions, exudates Oral exam: Lips and gums are healthy appearing.There is no oropharyngeal erythema or exudate. Neck:  No thyromegaly, masses, tenderness noted.    Heart:  Normal rate and regular rhythm. S1 and S2 normal without gallop, murmur, click, rub.  Lungs: Chest clear to auscultation without wheezes, rhonchi, rales, rubs. Abdomen: Bowel sounds are normal.  Abdomen is soft and nontender with no organomegaly, hernias, masses. GU: Deferred  Extremities:  No cyanosis, clubbing, edema. Neurologic exam:  Strength equal  in upper & lower extremities. Balance, Rhomberg, finger to nose testing could not be completed due to clinical state Deep tendon reflexes are equal Skin: Warm & dry w/o tenting. No significant lesions or rash.  See clinical summary under each active problem in the Problem List with associated updated therapeutic plan

## 2020-04-14 NOTE — Progress Notes (Incomplete)
Location:  [facility] Room Number: 119 A Place of Service:  SNF (31) Provider:  Kenard GowerMedina-Vargas, Chariti Havel, DNP, FNP-BC  Patient Care Team: Avon GullyFanta, Tesfaye, MD as PCP - General (Internal Medicine) Wyline MoodBranch, Dorothe PeaJonathan F, MD as PCP - Cardiology (Cardiology) Jena Gaussourk, Gerrit Friendsobert M, MD as Consulting Physician (Gastroenterology)  Extended Emergency Contact Information Primary Emergency Contact: Crystal Patterson  [place] Phone: [phone] Relation: Daughter Secondary Emergency Contact: Crystal Patterson Address: [address]          [place] [id number] [place] Home Phone: [phone] Relation: Sister  Code Status:  Full Code  Goals of care: Advanced Directive information Advanced Directives 04/04/2020  Does Patient Have a Medical Advance Directive? No  Would patient like information on creating a medical advance directive? No - Patient declined     Chief Complaint  Patient presents with  . Acute Visit    Hospital follow up    HPI:  Pt is a 55 y.o. female who [facility] 04[facility]20 post hospitalization 04/04/20 to 04/11/20.  She was hospitalized for infected left total knee arthroplasty for which she had left above the knee done on 04/04/20.  She has a PMH of lupus, hypertension, gout, CHF and arthritis.  She was seen in the room today and complained of phantom limb pain, 8/10.   Past Medical History:  Diagnosis Date  . Anginal pain (HCC) 2020  . Arthritis    Knees, hips,  . CHF (congestive heart failure) (HCC)   . Elevated troponin 05/24/2016  . Finger amputation, no complication   . GERD (gastroesophageal reflux disease)   . Gout   . Heart murmur    never has caused any problems per patient 04/02/20  . Hypercholesteremia   . Hypertension   . 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 Lupus [facility](HCC)    Past Surgical History:  Procedure Laterality Date  . AMPUTATION Left  04/04/2020   Procedure: LEFT ABOVE KNEE AMPUTATION;  Surgeon: Nadara Mustarduda, Marcus V, MD;  [facility];  Service: Orthopedics;  Laterality: Left;  . CARDIAC CATHETERIZATION  05/25/2016  . COLONOSCOPY N/A 01/17/2019   Procedure: COLONOSCOPY;  Surgeon: Corbin Adeourk, Robert M, MD;  Location: [facility] SU[facility];  Service: Endoscopy;  Laterality: N/A;  10:30  . EXCISIONAL TOTAL KNEE ARTHROPLASTY Left 02/05/2020   Procedure: REPEAT IRRIGATION AND DEBRIDEMENT LEFT KNEE;  Surgeon: Kathryne HitchBlackman, Christopher Y, MD;  Location: [facility] ORS;  Service: Orthopedics;  Laterality: Left;  . I & D EXTREMITY Left 02/08/2020   Procedure: EXCISIONAL DEBRIDEMENT LEFT KNEE;  Surgeon: Nadara Mustarduda, Marcus V, MD;  [facility];  Service: Orthopedics;  Laterality: Left;  . I & D KNEE WITH POLY EXCHANGE Left 01/25/2020   Procedure: INCISION AND DRAINAGE LEFT KNEE WITH POLY-LINER EXCHANGE;  Surgeon: Kathryne HitchBlackman, Christopher Y, MD;  Location: [facility] ORS;  Service: Orthopedics;  Laterality: Left;  . IRRIGATION AND DEBRIDEMENT KNEE Left 01/30/2020   Procedure: REPEAT IRRIGATION AND DEBRIDEMENT LEFT KNEE PLACEMENT OF WOUND VAC;  Surgeon: Kathryne HitchBlackman, Christopher Y, MD;  Location: [facility] ORS;  Service: Orthopedics;  Laterality: Left;  . left finger amputations    . POLYPECTOMY  01/17/2019   Procedure: POLYPECTOMY;  Surgeon: Corbin Adeourk, Robert M, MD;  [facility]: [facility];  Service: Endoscopy;;  . right hand surgery    . right knee arthroscopy    . RIGHT/LEFT HEART CATH AND CORONARY ANGIOGRAPHY N/A 05/25/2016   Procedure: Right/Left Heart Cath and Coronary Angiography;  Surgeon: Lennette Bihari, MD;  Location: [facility] INVASIVE CV LAB;  Service: Cardiovascular;  Laterality: N/A;  . SKIN SPLIT GRAFT Left 02/08/2020   Procedure: SKIN GRAFT SPLIT THICKNESS LEFT KNEE;  Surgeon: Nadara Mustard, MD;  Location: [facility] OR;  Service: Orthopedics;  Laterality: Left;  . TOTAL KNEE ARTHROPLASTY Left 01/04/2020   Procedure: LEFT TOTAL KNEE ARTHROPLASTY;  Surgeon: [name], MD;  Location:  [facility] ORS;  Service: Orthopedics;  Laterality: Left;    No Known Allergies  Outpatient Encounter Medications as of 04/14/2020  Medication Sig  . allopurinol (ZYLOPRIM) 100 MG tablet Take 1 tablet (100 mg total) by mouth daily.  [name] amLODipine (NORVASC) 10 MG tablet Take 1 tablet (10 mg total) by mouth daily.  [name] aspirin 81 MG chewable tablet Chew 1 tablet (81 mg total) by mouth 2 (two) times daily. (Patient taking differently: Chew 81 mg by mouth daily.)  . atenolol (TENORMIN) 25 MG tablet Take 1 tablet (25 mg total) by mouth daily.  [name] atorvastatin (LIPITOR) 40 MG tablet Take 1 tablet (40 mg total) by mouth daily at 6 PM. (Patient taking differently: Take 40 mg by mouth daily.)  . benazepril (LOTENSIN) 40 MG tablet Take 1 tablet (40 mg total) by mouth daily.  [name] escitalopram (LEXAPRO) 10 MG tablet Take 10 mg by mouth daily.  . furosemide (LASIX) 40 MG tablet Take 1 tablet (40 mg total) by mouth daily.  [name] gabapentin (NEURONTIN) 300 MG capsule Take 300 mg by mouth daily.  . methocarbamol (ROBAXIN) 500 MG tablet Take 1 tablet (500 mg total) by mouth every 6 (six) hours as needed for muscle spasms.  [name] omeprazole (PRILOSEC) 20 MG capsule Take 20 mg by mouth daily.  [name] oxyCODONE (OXY IR/ROXICODONE) 5 MG immediate release tablet Take 1-2 tablets (5-10 mg total) by mouth every 4 (four) hours as needed for moderate pain (pain score 4-6).  [name] potassium chloride (K-DUR) 10 MEQ tablet Take 10 mEq by mouth daily.   [name] spironolactone (ALDACTONE) 25 MG tablet Take 25 mg by mouth daily.  [name] zolpidem (AMBIEN) 10 MG tablet Take 10 mg by mouth at bedtime as needed for sleep.   . [DISCONTINUED] ibuprofen (ADVIL,MOTRIN) 800 MG tablet Take 800 mg by mouth 2 (two) times daily.   No facility-administered encounter medications on file as of 04/14/2020.    Review of Systems  GENERAL: No change in appetite, no fatigue, no weight changes, no fever, chills or weakness SKIN: Denies rash, itching, wounds, ulcer sores, or nail  abnormalities EYES: Denies change in vision, dry eyes, eye pain, itching or discharge EARS: Denies change in hearing, ringing in ears, or earache NOSE: Denies nasal congestion or epistaxis MOUTH and THROAT: Denies oral discomfort, gingival pain or bleeding, pain from teeth or hoarseness   RESPIRATORY: no cough, SOB, DOE, wheezing, hemoptysis CARDIAC: No chest pain, edema or palpitations GI: No abdominal pain, diarrhea, constipation, heart burn, nausea or vomiting GU: Denies dysuria, frequency, hematuria, incontinence, or discharge MUSCULOSKELETAL: Denies joint pain, muscle pain, back pain, restricted movement, or unusual weakness CIRCULATION: Denies claudication, edema of legs, varicosities, or cold extremities NEUROLOGICAL: Denies dizziness, syncope, numbness, or headache PSYCHIATRIC: Denies feelings of depression or anxiety. No report of hallucinations, insomnia, paranoia, or agitation ENDOCRINE: Denies polyphagia, polyuria, polydipsia, heat or cold intolerance HEME/LYMPH: Denies excessive bruising, petechia, enlarged lymph nodes, or bleeding problems IMMUNOLOGIC: Denies history of frequent infections, AIDS, or use of immunosuppressive agents   Immunization History  Administered Date(s) Administered  . Influenza Whole  12/24/2005, 12/20/2006, 12/28/2007  . Pneumococcal Polysaccharide-23 05/03/2007  . Td,absorbed, Preservative Free, Adult Use, Lf Unspecified 06/12/1995, 06/20/2004   Pertinent  Health Maintenance Due  Topic Date Due  . PAP SMEAR-Modifier  Never done  . MAMMOGRAM  Never done  . INFLUENZA VACCINE  09/30/2019  . COLONOSCOPY (Pts 45-44yrs Insurance coverage will need to be confirmed)  01/16/2029   No flowsheet data found.   Vitals:   04/14/20 0909  BP: 133/69  Pulse: 75  Resp: 20  Temp: 98.3 F (36.8 C)  Weight: 125 lb (56.7 kg)   Body mass index is 21.46 kg/m.  Physical Exam  GENERAL APPEARANCE: Well nourished. In no acute distress. Normal body  habitus SKIN:  Skin is warm and dry. There are no suspicious lesions or rash HEAD: Normal in size and contour. No evidence of trauma EYES: Lids open and close normally. No blepharitis, entropion or ectropion. PERRL. Conjunctivae are clear and sclerae are white. Lenses are without opacity EARS: Pinnae are normal. Patient hears normal voice tunes of the examiner MOUTH and THROAT: Lips are without lesions. Oral mucosa is moist and without lesions. Tongue is normal in shape, size, and color and without lesions NECK: supple, trachea midline, no neck masses, no thyroid tenderness, no thyromegaly LYMPHATICS: No LAN in the neck, no supraclavicular LAN RESPIRATORY: Breathing is even & unlabored, BS CTAB CARDIAC: RRR, no murmur,no extra heart sounds, no edema GI: Abdomen soft, normal BS, no masses, no tenderness, no hepatomegaly, no splenomegaly MUSCULOSKELETAL: No deformities. Movement at each extremity is full and painless. Strength is 5/5 at each extremity. Back is without kyphosis or scoliosis CIRCULATION: Pedal pulses are 2+. There is no edema of the legs, ankles and feet NEUROLOGICAL: There is no tremor. Speech is clear PSYCHIATRIC: Alert and oriented X 3. Affect and behavior are appropriate  Labs reviewed: Recent Labs    02/05/20 0417 02/09/20 0352 04/04/20 0705  NA 136 132* 135  K 3.7 4.5 3.5  CL 99 99 102  CO2 28 27 24   GLUCOSE 96 102* 98  BUN 5* 8 8  CREATININE 0.60 0.68 0.71  CALCIUM 8.6* 8.6* 9.1   Recent Labs    02/04/20 0315 04/04/20 0705  AST 37 26  ALT 11 14  ALKPHOS 90 89  BILITOT 1.1 1.1  PROT 6.8 7.1  ALBUMIN 2.5* 2.9*   Recent Labs    02/04/20 0315 02/05/20 0417 04/04/20 0705 04/05/20 0352 04/05/20 1604 04/06/20 0341  WBC 11.7*   < > 7.9 7.3  --  7.6  NEUTROABS 7.9*  --   --   --   --   --   HGB 9.5*   < > 10.7* 6.9* 8.6* 8.4*  HCT 30.2*   < > 35.6* 22.9* 27.5* 26.1*  MCV 96.2   < > 89.7 90.5  --  89.1  PLT 286   < > 262 180  --  164   < > = values in  this interval not displayed.   Lab Results  Component Value Date   TSH 1.839 12/06/2016   Lab Results  Component Value Date   HGBA1C 4.6 (L) 05/25/2016   Lab Results  Component Value Date   CHOL 134 05/25/2016   HDL 39 (L) 05/25/2016   LDLCALC 82 05/25/2016   TRIG 63 05/25/2016   CHOLHDL 3.4 05/25/2016    Significant Diagnostic Results in last 30 days:  No results found.  Assessment/Plan ***   Family/ staff Communication: ***  Labs/tests ordered:  ***  Goals of care:   ***   Kenard Gower, DNP, MSN, FNP-BC [facility] [phone] (Monday-Friday 8:00 a.m. - 5:00 p.m.) [phone] (after hours)

## 2020-04-14 NOTE — Telephone Encounter (Signed)
Joann (LPN) called requesting wound care orders on patient. Please call Joann at [phone]. If unable to answer leave detailed message on secure line.

## 2020-04-14 NOTE — Progress Notes (Deleted)
Location:   [facility] Room Number: 119 Place of Service:  SNF [phone]) Provider:  Merdina-Vargas, Margit Banda, NP  Avon Gully, MD  Patient Care Team: Avon Gully, MD as PCP - General (Internal Medicine) Antoine Poche, MD as PCP - Cardiology (Cardiology) Jena Gauss Gerrit Friends, MD as Consulting Physician (Gastroenterology)  Extended Emergency Contact Information Primary Emergency Contact: Marcy Panning [place] Phone: [phone] Relation: Daughter Secondary Emergency Contact: Antionette Char Address: [address]          [place] [address] [place] Home Phone: [phone] Relation: Sister  Code Status:   Goals of care: Advanced Directive information Advanced Directives 04/04/2020  Does Patient Have a Medical Advance Directive? No  Would patient like information on creating a medical advance directive? No - Patient declined     Chief Complaint  Patient presents with  . Follow-up    Hospital follow up. Discuss need for Hep C screening, COVID vaccine, Tetanus/Tdap, Pap smear, Mammogram, Flu vaccine and colonoscopy.    HPI:  Pt is a 55 y.o. female seen today for an acute visit for    Past Medical History:  Diagnosis Date  . Anginal pain (HCC) 2020  . Arthritis    Knees, hips,  . CHF (congestive heart failure) (HCC)   . Elevated troponin 05/24/2016  . Finger amputation, no complication   . GERD (gastroesophageal reflux disease)   . Gout   . Heart murmur    never has caused any problems per patient 04/02/20  . Hypercholesteremia   . Hypertension   . 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 Lupus [facility])    Past Surgical History:  Procedure Laterality Date  . AMPUTATION Left 04/04/2020   Procedure: LEFT ABOVE KNEE AMPUTATION;  Surgeon: Nadara Mustard, MD;  Location: [facility] OR;  Service: Orthopedics;  Laterality: Left;  . CARDIAC CATHETERIZATION  05/25/2016  . COLONOSCOPY N/A 01/17/2019   Procedure:  COLONOSCOPY;  Surgeon: Corbin Ade, MD;  [facility];  Service: Endoscopy;  Laterality: N/A;  10:30  . EXCISIONAL TOTAL KNEE ARTHROPLASTY Left 02/05/2020   Procedure: REPEAT IRRIGATION AND DEBRIDEMENT LEFT KNEE;  Surgeon: [name], MD;  Location: [facility] ORS;  Service: Orthopedics;  Laterality: Left;  . I & D EXTREMITY Left 02/08/2020   Procedure: EXCISIONAL DEBRIDEMENT LEFT KNEE;  Surgeon: Nadara Mustard, MD;  Location: [facility] OR;  Service: Orthopedics;  Laterality: Left;  . I & D KNEE WITH POLY EXCHANGE Left 01/25/2020   Procedure: INCISION AND DRAINAGE LEFT KNEE WITH POLY-LINER EXCHANGE;  Surgeon: [name], MD;  Location: [facility] ORS;  Service: Orthopedics;  Laterality: Left;  . IRRIGATION AND DEBRIDEMENT KNEE Left 01/30/2020   Procedure: REPEAT IRRIGATION AND DEBRIDEMENT LEFT KNEE PLACEMENT OF WOUND VAC;  Surgeon: [name], MD;  Location: [facility]S;  Service: Orthopedics;  Laterality: Left;  . left finger amputations    . POLYPECTOMY  01/17/2019   Procedure: POLYPECTOMY;  Surgeon: Corbin Ade, MD;  [facility];  Service: Endoscopy;;  . right hand surgery    . right knee arthroscopy    . RIGHT/LEFT HEART CATH AND CORONARY ANGIOGRAPHY N/A 05/25/2016   Procedure: Right/Left Heart Cath and Coronary Angiography;  Surgeon: Lennette Bihari, MD;  [facility]: [facility] INVASIVE CV LAB;  Service: Cardiovascular;  Laterality: N/A;  . SKIN SPLIT GRAFT Left 02/08/2020   Procedure: SKIN GRAFT SPLIT THICKNESS LEFT KNEE;  Surgeon: Nadara Mustard, MD;  Location: [facility] OR;  Service: Orthopedics;  Laterality: Left;  . TOTAL KNEE ARTHROPLASTY Left 01/04/2020   Procedure: LEFT TOTAL KNEE ARTHROPLASTY;  Surgeon: [name], MD;  Location: [facility]S;  Service: Orthopedics;  Laterality: Left;    No Known Allergies  Allergies as of 04/14/2020   No Known Allergies     Medication List       Accurate as of April 14, 2020 10:09 AM. If you have any  questions, ask your nurse or doctor.        STOP taking these medications   ibuprofen 800 MG tablet Commonly known as: ADVIL Stopped by: Kenard Gower, NP     TAKE these medications   allopurinol 100 MG tablet Commonly known as: ZYLOPRIM Take 1 tablet (100 mg total) by mouth daily.   amLODipine 10 MG tablet Commonly known as: NORVASC Take 1 tablet (10 mg total) by mouth daily.   aspirin 81 MG chewable tablet Chew 1 tablet (81 mg total) by mouth 2 (two) times daily. What changed: when to take this   atenolol 25 MG tablet Commonly known as: TENORMIN Take 1 tablet (25 mg total) by mouth daily.   atorvastatin 40 MG tablet Commonly known as: LIPITOR Take 1 tablet (40 mg total) by mouth daily at 6 PM. What changed: when to take this   benazepril 40 MG tablet Commonly known as: LOTENSIN Take 1 tablet (40 mg total) by mouth daily.   escitalopram 10 MG tablet Commonly known as: LEXAPRO Take 10 mg by mouth daily.   furosemide 40 MG tablet Commonly known as: LASIX Take 1 tablet (40 mg total) by mouth daily.   gabapentin 300 MG capsule Commonly known as: NEURONTIN Take 300 mg by mouth daily.   methocarbamol 500 MG tablet Commonly known as: ROBAXIN Take 1 tablet (500 mg total) by mouth every 6 (six) hours as needed for muscle spasms.   omeprazole 20 MG capsule Commonly known as: PRILOSEC Take 20 mg by mouth daily.   oxyCODONE 5 MG immediate release tablet Commonly known as: Oxy IR/ROXICODONE Take 1-2 tablets (5-10 mg total) by mouth every 4 (four) hours as needed for moderate pain (pain score 4-6).   potassium chloride 10 MEQ tablet Commonly known as: KLOR-CON Take 10 mEq by mouth daily.   spironolactone 25 MG tablet Commonly known as: ALDACTONE Take 25 mg by mouth daily.   zolpidem 10 MG tablet Commonly known as: AMBIEN Take 10 mg by mouth at bedtime as needed for sleep.       Review of Systems  Immunization History  Administered Date(s)  Administered  . Influenza Whole 12/24/2005, 12/20/2006, 12/28/2007   Pertinent  Health Maintenance Due  Topic Date Due  . PAP SMEAR-Modifier  Never done  . MAMMOGRAM  Never done  . INFLUENZA VACCINE  09/30/2019  . COLONOSCOPY (Pts 45-60yrs Insurance coverage will need to be confirmed)  01/16/2029   No flowsheet data found. Functional Status Survey:    Vitals:   04/14/20 0909  BP: 133/69  Pulse: 75  Resp: 20  Temp: 98.3 F (36.8 C)  Weight: 125 lb (56.7 kg)   Body mass index is 21.46 kg/m. Physical Exam  Labs reviewed: Recent Labs    02/05/20 0417 02/09/20 0352 04/04/20 0705  NA 136 132* 135  K 3.7 4.5 3.5  CL 99 99 102  CO2 28 27 24   GLUCOSE 96 102* 98  BUN 5* 8 8  CREATININE 0.60 0.68 0.71  CALCIUM 8.6* 8.6* 9.1  Recent Labs    02/04/20 0315 04/04/20 0705  AST 37 26  ALT 11 14  ALKPHOS 90 89  BILITOT 1.1 1.1  PROT 6.8 7.1  ALBUMIN 2.5* 2.9*   Recent Labs    02/04/20 0315 02/05/20 0417 04/04/20 0705 04/05/20 0352 04/05/20 1604 04/06/20 0341  WBC 11.7*   < > 7.9 7.3  --  7.6  NEUTROABS 7.9*  --   --   --   --   --   HGB 9.5*   < > 10.7* 6.9* 8.6* 8.4*  HCT 30.2*   < > 35.6* 22.9* 27.5* 26.1*  MCV 96.2   < > 89.7 90.5  --  89.1  PLT 286   < > 262 180  --  164   < > = values in this interval not displayed.   Lab Results  Component Value Date   TSH 1.839 12/06/2016   Lab Results  Component Value Date   HGBA1C 4.6 (L) 05/25/2016   Lab Results  Component Value Date   CHOL 134 05/25/2016   HDL 39 (L) 05/25/2016   LDLCALC 82 05/25/2016   TRIG 63 05/25/2016   CHOLHDL 3.4 05/25/2016    Significant Diagnostic Results in last 30 days:  No results found.  Assessment/Plan There are no diagnoses linked to this encounter.   Family/ staff Communication:   Labs/tests ordered:

## 2020-04-14 NOTE — Progress Notes (Signed)
Location:  [facility] Room Number: 119 A Place of Service:  SNF (31) Provider:  Kenard Gower, DNP, FNP-BC  Patient Care Team: Avon Gully, MD as PCP - General (Internal Medicine) Wyline Mood Dorothe Pea, MD as PCP - Cardiology (Cardiology) Jena Gauss Gerrit Friends, MD as Consulting Physician (Gastroenterology)  Extended Emergency Contact Information Primary Emergency Contact: Marcy Panning [place] of [place] Phone: [phone] Relation: Daughter Secondary Emergency Contact: Antionette Char Address: [address]          [place] [address] [place] Home Phone: [phone] Relation: Sister  Code Status:  Full Code  Goals of care: Advanced Directive information Advanced Directives 04/04/2020  Does Patient Have a Medical Advance Directive? No  Would patient like information on creating a medical advance directive? No - Patient declined     Chief Complaint  Patient presents with  . Acute Visit    Hospital follow up    HPI:  Pt is a 55 y.o. female who was admitted to [facility] on 04/11/20 post hospitalization 04/04/20 to 04/11/20.  She was hospitalized for infected left total knee arthroplasty for which she had left above the knee done on 04/04/20.  She has a PMH of lupus, hypertension, gout, CHF and arthritis.  She was seen in the room today and complained of phantom limb pain, 8/10. She has order for PRN Oxycodone for pain and PRN methocarbamol for muscle spasms   Past Medical History:  Diagnosis Date  . Anginal pain (HCC) 2020  . Arthritis    Knees, hips,  . CHF (congestive heart failure) (HCC)   . Elevated troponin 05/24/2016  . Finger amputation, no complication   . GERD (gastroesophageal reflux disease)   . Gout   . Heart murmur    never has caused any problems per patient 04/02/20  . Hypercholesteremia   . Hypertension   . 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 Lupus [facility])     Past Surgical History:  Procedure Laterality Date  . AMPUTATION Left 04/04/2020   Procedure: LEFT ABOVE KNEE AMPUTATION;  Surgeon: Nadara Mustard, MD;  Location: [facility] OR;  Service: Orthopedics;  Laterality: Left;  . CARDIAC CATHETERIZATION  05/25/2016  . COLONOSCOPY N/A 01/17/2019   Procedure: COLONOSCOPY;  Surgeon: Corbin Ade, MD;  Location: [facility] SUITE;  Service: Endoscopy;  Laterality: N/A;  10:30  . EXCISIONAL TOTAL KNEE ARTHROPLASTY Left 02/05/2020   Procedure: REPEAT IRRIGATION AND DEBRIDEMENT LEFT KNEE;  Surgeon: [name], MD;  Location: [facility] ORS;  Service: Orthopedics;  Laterality: Left;  . I & D EXTREMITY Left 02/08/2020   Procedure: EXCISIONAL DEBRIDEMENT LEFT KNEE;  Surgeon: Nadara Mustard, MD;  Location: [facility] OR;  Service: Orthopedics;  Laterality: Left;  . I & D KNEE WITH POLY EXCHANGE Left 01/25/2020   Procedure: INCISION AND DRAINAGE LEFT KNEE WITH POLY-LINER EXCHANGE;  Surgeon: [name], MD;  Location: [facility] ORS;  Service: Orthopedics;  Laterality: Left;  . IRRIGATION AND DEBRIDEMENT KNEE Left 01/30/2020   Procedure: REPEAT IRRIGATION AND DEBRIDEMENT LEFT KNEE PLACEMENT OF WOUND VAC;  Surgeon: [name], MD;  Location: [facility] ORS;  Service: Orthopedics;  Laterality: Left;  . left finger amputations    . POLYPECTOMY  01/17/2019   Procedure: POLYPECTOMY;  Surgeon: Corbin Ade, MD;  Location: [facility] SUITE;  Service: Endoscopy;;  . right hand surgery    . right knee arthroscopy    . RIGHT/LEFT HEART CATH AND  CORONARY ANGIOGRAPHY N/A 05/25/2016   Procedure: Right/Left Heart Cath and Coronary Angiography;  Surgeon: Lennette Bihari, MD;  Location: [facility] INVASIVE CV LAB;  Service: Cardiovascular;  Laterality: N/A;  . SKIN SPLIT GRAFT Left 02/08/2020   Procedure: SKIN GRAFT SPLIT THICKNESS LEFT KNEE;  Surgeon: Nadara Mustard, MD;  Location: [facility] OR;  Service: Orthopedics;  Laterality: Left;  . TOTAL KNEE ARTHROPLASTY Left 01/04/2020   Procedure: LEFT  TOTAL KNEE ARTHROPLASTY;  Surgeon: [name], MD;  Location: [facility] ORS;  Service: Orthopedics;  Laterality: Left;    No Known Allergies  Outpatient Encounter Medications as of 04/14/2020  Medication Sig  . allopurinol (ZYLOPRIM) 100 MG tablet Take 1 tablet (100 mg total) by mouth daily.  [name] amLODipine (NORVASC) 10 MG tablet Take 1 tablet (10 mg total) by mouth daily.  [name] aspirin 81 MG chewable tablet Chew 1 tablet (81 mg total) by mouth 2 (two) times daily. (Patient taking differently: Chew 81 mg by mouth daily.)  . atenolol (TENORMIN) 25 MG tablet Take 1 tablet (25 mg total) by mouth daily.  [name] atorvastatin (LIPITOR) 40 MG tablet Take 1 tablet (40 mg total) by mouth daily at 6 PM. (Patient taking differently: Take 40 mg by mouth daily.)  . benazepril (LOTENSIN) 40 MG tablet Take 1 tablet (40 mg total) by mouth daily.  [name] escitalopram (LEXAPRO) 10 MG tablet Take 10 mg by mouth daily.  . furosemide (LASIX) 40 MG tablet Take 1 tablet (40 mg total) by mouth daily.  [name] gabapentin (NEURONTIN) 300 MG capsule Take 300 mg by mouth daily.  . methocarbamol (ROBAXIN) 500 MG tablet Take 1 tablet (500 mg total) by mouth every 6 (six) hours as needed for muscle spasms.  [name] omeprazole (PRILOSEC) 20 MG capsule Take 20 mg by mouth daily.  [name] oxyCODONE (OXY IR/ROXICODONE) 5 MG immediate release tablet Take 1-2 tablets (5-10 mg total) by mouth every 4 (four) hours as needed for moderate pain (pain score 4-6).  [name] potassium chloride (K-DUR) 10 MEQ tablet Take 10 mEq by mouth daily.   [name] spironolactone (ALDACTONE) 25 MG tablet Take 25 mg by mouth daily.  [name] zolpidem (AMBIEN) 10 MG tablet Take 10 mg by mouth at bedtime as needed for sleep.   . [DISCONTINUED] ibuprofen (ADVIL,MOTRIN) 800 MG tablet Take 800 mg by mouth 2 (two) times daily.   No facility-administered encounter medications on file as of 04/14/2020.    Review of Systems  GENERAL: No change in appetite, no fatigue, no weight changes, no fever,  chills or weakness MOUTH and THROAT: Denies oral discomfort, gingival pain or bleeding RESPIRATORY: no cough, SOB, DOE, wheezing, hemoptysis CARDIAC: No chest pain, edema or palpitations GI: No abdominal pain, diarrhea, constipation, heart burn, nausea or vomiting GU: Denies dysuria, frequency, hematuria, incontinence, or discharge NEUROLOGICAL: Denies dizziness, syncope, numbness, or headache PSYCHIATRIC: Denies feelings of depression or anxiety. No report of hallucinations, insomnia, paranoia, or agitation   Immunization History  Administered Date(s) Administered  . Influenza Whole 12/24/2005, 12/20/2006, 12/28/2007  . Pneumococcal Polysaccharide-23 05/03/2007  . Td,absorbed, Preservative Free, Adult Use, Lf Unspecified 06/12/1995, 06/20/2004   Pertinent  Health Maintenance Due  Topic Date Due  . PAP SMEAR-Modifier  Never done  . MAMMOGRAM  Never done  . INFLUENZA VACCINE  09/30/2019  . COLONOSCOPY (Pts 45-43yrs Insurance coverage will need to be confirmed)  01/16/2029   No flowsheet data found.   Vitals:   04/14/20 0909  BP: 133/69  Pulse: 75  Resp: 20  Temp: 98.3 F (36.8 C)  Weight: 125 lb (56.7 kg)   Body mass index is 21.46 kg/m.  Physical Exam  GENERAL APPEARANCE: Well nourished. In no acute distress. SKIN:  Left AKA stump with dressing, dry MOUTH and THROAT: Lips are without lesions. Oral mucosa is moist and without lesions. Tongue is normal in shape, size, and color and without lesions RESPIRATORY: Breathing is even & unlabored, BS CTAB CARDIAC: RRR, no murmur,no extra heart sounds, no edema. Left chest porta cath. GI: Abdomen soft, normal BS, no masses, no tenderness NEUROLOGICAL: There is no tremor. Speech is clear. Alert and oriented X 3. PSYCHIATRIC:  Affect and behavior are appropriate  Labs reviewed: Recent Labs    02/05/20 0417 02/09/20 0352 04/04/20 0705  NA 136 132* 135  K 3.7 4.5 3.5  CL 99 99 102  CO2 28 27 24   GLUCOSE 96 102* 98  BUN  5* 8 8  CREATININE 0.60 0.68 0.71  CALCIUM 8.6* 8.6* 9.1   Recent Labs    02/04/20 0315 04/04/20 0705  AST 37 26  ALT 11 14  ALKPHOS 90 89  BILITOT 1.1 1.1  PROT 6.8 7.1  ALBUMIN 2.5* 2.9*   Recent Labs    02/04/20 0315 02/05/20 0417 04/04/20 0705 04/05/20 0352 04/05/20 1604 04/06/20 0341  WBC 11.7*   < > 7.9 7.3  --  7.6  NEUTROABS 7.9*  --   --   --   --   --   HGB 9.5*   < > 10.7* 6.9* 8.6* 8.4*  HCT 30.2*   < > 35.6* 22.9* 27.5* 26.1*  MCV 96.2   < > 89.7 90.5  --  89.1  PLT 286   < > 262 180  --  164   < > = values in this interval not displayed.   Lab Results  Component Value Date   TSH 1.839 12/06/2016   Lab Results  Component Value Date   HGBA1C 4.6 (L) 05/25/2016   Lab Results  Component Value Date   CHOL 134 05/25/2016   HDL 39 (L) 05/25/2016   LDLCALC 82 05/25/2016   TRIG 63 05/25/2016   CHOLHDL 3.4 05/25/2016     Assessment/Plan  1. Knee osteomyelitis, left (HCC) S/P Left AKA on 04/04/20 -   Continue PRN oxycodone for pain, PRN methocarbamol for muscle spasm -   Follow-up with orthopedics in 1 week  2. Essential hypertension -Continue benazepril, amlodipine and atenolol  3. Chronic congestive heart failure, unspecified heart failure type (HCC) -  No SOB, continue furosemide and spironolactone  4. PAD (peripheral artery disease) (HCC) -  History of S/P amputation of right and left hand fingers  5. Major depression, recurrent, chronic (HCC) -  Mood is stable, continue Escitalopram     Family/ staff Communication: Discussed plan of care with resident and charge nurse.  Labs/tests ordered: None  Goals of care:   Short-term care   06/02/20, DNP, MSN, FNP-BC [facility] [phone] (Monday-Friday 8:00 a.m. - 5:00 p.m.) [phone] (after hours)

## 2020-04-15 ENCOUNTER — Telehealth: Payer: Self-pay | Admitting: *Deleted

## 2020-04-15 ENCOUNTER — Encounter: Payer: Self-pay | Admitting: Internal Medicine

## 2020-04-15 ENCOUNTER — Non-Acute Institutional Stay (SKILLED_NURSING_FACILITY): Payer: Medicare Other | Admitting: Internal Medicine

## 2020-04-15 DIAGNOSIS — I10 Essential (primary) hypertension: Secondary | ICD-10-CM | POA: Diagnosis not present

## 2020-04-15 DIAGNOSIS — R0683 Snoring: Secondary | ICD-10-CM | POA: Insufficient documentation

## 2020-04-15 DIAGNOSIS — T8459XS Infection and inflammatory reaction due to other internal joint prosthesis, sequela: Secondary | ICD-10-CM | POA: Diagnosis not present

## 2020-04-15 DIAGNOSIS — Z96659 Presence of unspecified artificial knee joint: Secondary | ICD-10-CM | POA: Diagnosis not present

## 2020-04-15 DIAGNOSIS — I7300 Raynaud's syndrome without gangrene: Secondary | ICD-10-CM

## 2020-04-15 DIAGNOSIS — Z9189 Other specified personal risk factors, not elsewhere classified: Secondary | ICD-10-CM | POA: Diagnosis not present

## 2020-04-15 DIAGNOSIS — E8809 Other disorders of plasma-protein metabolism, not elsewhere classified: Secondary | ICD-10-CM | POA: Diagnosis not present

## 2020-04-15 NOTE — Assessment & Plan Note (Addendum)
Opioid risks ( respiratory suppression, constipation) discussed.Weaning initiated.

## 2020-04-15 NOTE — Progress Notes (Signed)
NURSING HOME LOCATION:  [facility] ROOM NUMBER: 119/[id number]    CODE STATUS: Full Code   PCP: Avon Gully, MD    This is a comprehensive admission note to [facility] performed on this date less than 30 days from date of admission. Included are preadmission medical/surgical history; reconciled medication list; family history; social history and comprehensive review of systems.  Corrections and additions to the records were documented. Comprehensive physical exam was also performed. Additionally a clinical summary was entered for each active diagnosis pertinent to this admission in the Problem List to enhance continuity of care.  HPI: The patient was hospitalized 2/4-2/12/2020 with failed infected knee with a final diagnosis of dehiscence of the infected left total knee arthroplasty.  On 2/4 Dr. Lajoyce Corners completed the left AKA. Postop sequential compression devices were applied; aspirin was administered for DVT prophylaxis. Postop serial blood pressures tended to be soft in the range of 95-100/70.  Past medical and surgical history: Is very complex with diagnoses of lupus, essential hypertension, dyslipidemia, history of gout, GERD, and history of congestive heart failure. She has had multiple vascular procedures as well as orthopedic procedures.  She is also had a colon polypectomy.  Social history: By history she used to drink 2-3 cans of beer a day.  Smoking history is 15 pack years to date according to the record.  By history she has been a marijuana smoker.  Family history: Is positive for cancer.   Review of systems: She states she is doing "just fine" but then adds "my leg hurts all the time".  When asked why she had been in the hospital ; she stated "to have the amputation". When I asked why it was necessary she stated "poor circulation". She does give a history of lupus but cannot tell me who is actively following this.  She also does validate that her digits will change  color with exposure to cold. She describes chronic constipation with bowel movements every 4-5 days.  She states she has not had a bowel movement since she was hospitalized 2/4.  Constitutional: No fever, significant weight change, fatigue  Eyes: No redness, discharge, pain, vision change ENT/mouth: No nasal congestion, purulent discharge, earache, change in hearing, sore throat  Cardiovascular: No chest pain, palpitations, paroxysmal nocturnal dyspnea, claudication, edema  Respiratory: No cough, sputum production, hemoptysis, DOE, significant snoring, apnea  Gastrointestinal: No heartburn, dysphagia, abdominal pain, nausea /vomiting, rectal bleeding, melena Genitourinary: No dysuria, hematuria, pyuria, incontinence, nocturia Dermatologic: No rash, pruritus Neurologic: No dizziness, headache, syncope, seizures, numbness, tingling Psychiatric: No significant anxiety, depression, insomnia, anorexia Endocrine: No change in hair/skin/nails, excessive thirst, excessive hunger, excessive urination  Hematologic/lymphatic: No significant bruising, lymphadenopathy, abnormal bleeding Allergy/immunology: No itchy/watery eyes, significant sneezing, urticaria, angioedema  Physical exam:  Pertinent or positive findings: Facies tend to be blank.  She is very polite responding with "yes or or no, Sir" to all queries.  Breath sounds are decreased.  First heart sound is accentuated. There is a raspy grade 1.5-2 systolic murmur at the left sternal border.  The radial pulses are decreased; the right may be slightly stronger than the left.  The right lower extremity pedal pulses are decreased as well.  There are no ischemic changes of the right foot.  She has multiple amputations of the hands.  The right index finger is surgically missing; there is a DIP amputation of the third right finger; and PIP amputation of the fourth finger.  On the left there is a DIP amputation  of the thumb.  The index finger is absent  surgically.  There is a amputation of the PIP joint of the third digit and the fourth digit is surgically absent as well.  General appearance:no acute distress, increased work of breathing is present.   Lymphatic: No lymphadenopathy about the head, neck, axilla. Eyes: No conjunctival inflammation or lid edema is present. There is no scleral icterus. Ears:  External ear exam shows no significant lesions or deformities.   Nose:  External nasal examination shows no deformity or inflammation. Nasal mucosa are pink and moist without lesions, exudates Oral exam: Lips and gums are healthy appearing.There is no oropharyngeal erythema or exudate. Neck:  No thyromegaly, masses, tenderness noted.    Heart:  Normal rate and regular rhythm. S2 normal without gallop,  click, rub.  Lungs: without wheezes, rhonchi, rales, rubs. Abdomen: Bowel sounds are normal.  Abdomen is soft and nontender with no organomegaly, hernias, masses. GU: Deferred  Extremities:  No cyanosis, clubbing, edema. Neurologic exam:  Balance, Rhomberg, finger to nose testing could not be completed due to clinical state Deep tendon reflexes are equal Skin: Warm & dry w/o tenting. No significant lesions or rash.  See clinical summary under each active problem in the Problem List with associated updated therapeutic plan

## 2020-04-15 NOTE — Patient Instructions (Signed)
See assessment and plan under each diagnosis in the problem list and acutely for this visit 

## 2020-04-15 NOTE — Telephone Encounter (Signed)
Pt is an AKA called and sw Joann and pt does not have the wound vac on so advised to provide a dry dressing change with some compression and ace bandage or a shrinker if she has one and to change this daily. Pt has an appt on Friday and will update any wound care orders after exam that day. Will call with any other questions.

## 2020-04-15 NOTE — Assessment & Plan Note (Signed)
Orthopedic F/U 04/18/2020. PT/OT @ SNF

## 2020-04-15 NOTE — Telephone Encounter (Signed)
90 day call deferred due to recent AKA by Dr. Lajoyce Corners related to dehiscence of the wound and continued complications after total joint replacement on 01/04/20.

## 2020-04-15 NOTE — Assessment & Plan Note (Addendum)
Her history suggests that she may have Raynaud's disease which would be exacerbated by smoking and cold exposure.  She does validate change in color or temperature of the fingers with cold exposure.  Amlodipine could be protective to some extent.  She was informed that it is critical to avoid cold as well as smoking in any format.

## 2020-04-15 NOTE — Assessment & Plan Note (Addendum)
Blood pressure was soft postop AKA.  Antihypertensive medications will be adjusted as blood pressure recheck was 75/48. Benazepril 40 will be decreased to 20 mg daily and spironolactone stopped with ongoing blood pressure monitor.

## 2020-04-15 NOTE — Assessment & Plan Note (Addendum)
Total protein was normal at 7.1 but albumin was 2.9.  Nutrition consult at SNF with supplementation.

## 2020-04-16 ENCOUNTER — Other Ambulatory Visit: Payer: Self-pay | Admitting: Adult Health

## 2020-04-16 MED ORDER — OXYCODONE HCL 5 MG PO TABS: 5.0000 mg | ORAL_TABLET | Freq: Four times a day (QID) | ORAL | 0 refills | Status: DC | PRN

## 2020-04-18 ENCOUNTER — Ambulatory Visit (INDEPENDENT_AMBULATORY_CARE_PROVIDER_SITE_OTHER): Payer: Medicaid Other | Admitting: Physician Assistant

## 2020-04-18 ENCOUNTER — Encounter: Payer: Self-pay | Admitting: Physician Assistant

## 2020-04-18 ENCOUNTER — Other Ambulatory Visit: Payer: Self-pay | Admitting: Physician Assistant

## 2020-04-18 DIAGNOSIS — Z96659 Presence of unspecified artificial knee joint: Secondary | ICD-10-CM

## 2020-04-18 DIAGNOSIS — T84018S Broken internal joint prosthesis, other site, sequela: Secondary | ICD-10-CM

## 2020-04-18 NOTE — Progress Notes (Signed)
Office Visit Note   Patient: Crystal Patterson           Date of Birth: [birth year]           MRN: [id number] Visit Date: 04/18/2020              Requested by: Avon Gully, MD [address] [place],  [place] [address] PCP: Avon Gully, MD  Chief Complaint  Patient presents with  . Left Leg - Routine Post Op    04/04/20 left AKA       HPI: Patient is 2 weeks status post left above-knee amputation.  She is in a nursing facility overall doing well  Assessment & Plan: Visit Diagnoses: No diagnosis found.  Plan: Have provided an order for above-the-knee amputation shrinkers.  We will remove remainder sutures and staples next week.  May cleanse with antibacterial soap and water  Follow-Up Instructions: No follow-ups on file.   Ortho Exam  Patient is alert, oriented, no adenopathy, well-dressed, normal affect, normal respiratory effort. Surgical incision has well apposed wound edges no drainage no necrosis no wound dehiscence overall good healing.  Surgical staples and sutures were in place.  Some of these were removed today.  No sign of cellulitis or infection  Imaging: No results found. No images are attached to the encounter.  Labs: Lab Results  Component Value Date   HGBA1C 4.6 (L) 05/25/2016   ESRSEDRATE 42 (H) 02/04/2020   ESRSEDRATE 70 (H) 01/26/2020   ESRSEDRATE 6 07/09/2008   CRP 8.4 (H) 02/04/2020   CRP 14.8 (H) 01/26/2020   CRP 0.3 07/09/2008   REPTSTATUS 02/13/2020 FINAL 02/08/2020   GRAMSTAIN NO WBC SEEN NO ORGANISMS SEEN  02/08/2020   CULT  02/08/2020    RARE PROTEUS MIRABILIS DR. BLACKMAN NOTIFIED VIA EPIC MESSAGE REGARDING CULTURE GROWTH NO ANAEROBES ISOLATED Performed at [facility], [address] [place], [place] [address]    LABORGA PROTEUS MIRABILIS 02/08/2020     Lab Results  Component Value Date   ALBUMIN 2.9 (L) 04/04/2020   ALBUMIN 2.5 (L) 02/04/2020   ALBUMIN 3.4 (L) 12/06/2016    Lab Results  Component Value Date    MG 1.5 (L) 12/06/2016   MG 1.4 (L) 05/25/2016   No results found for: VD25OH  No results found for: PREALBUMIN CBC EXTENDED Latest Ref Rng & Units 04/06/2020 04/05/2020 04/05/2020  WBC 4.0 - 10.5 K/uL 7.6 - 7.3  RBC 3.87 - 5.11 MIL/uL 2.93(L) - 2.53(L)  HGB 12.0 - 15.0 g/dL 4.0(G) 8.6(P) 6.9(LL)  HCT 36.0 - 46.0 % 26.1(L) 27.5(L) 22.9(L)  PLT 150 - 400 K/uL 164 - 180  NEUTROABS 1.7 - 7.7 K/uL - - -  LYMPHSABS 0.7 - 4.0 K/uL - - -     There is no height or weight on file to calculate BMI.  Orders:  No orders of the defined types were placed in this encounter.  No orders of the defined types were placed in this encounter.    Procedures: No procedures performed  Clinical Data: No additional findings.  ROS:  All other systems negative, except as noted in the HPI. Review of Systems  Objective: Vital Signs: LMP  (LMP Unknown)   Specialty Comments:  No specialty comments available.  PMFS History: Patient Active Problem List   Diagnosis Date Noted  . Hypoalbuminemia 04/15/2020  . At risk for adverse drug event 04/15/2020  . Knee osteomyelitis, left (HCC) 04/04/2020  . Failed total knee arthroplasty, sequela   .  Open knee wound, left, sequela   . Infection of total knee replacement (HCC) 01/25/2020  . 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 Thrombosis of left ulnar artery (HCC) 01/23/2020  . Status post total left knee replacement 01/06/2020  . Unilateral primary osteoarthritis, left knee 09/26/2019  . Unilateral primary osteoarthritis, right knee 09/26/2019  . Systemic lupus erythematosus arthritis (HCC) 09/12/2019  . Arthritis of both knees 09/12/2019  . CAD (coronary artery disease) 07/14/2017  . Tobacco use disorder 12/30/2016  . Metabolic bone disease 12/30/2016  . Marijuana use 12/30/2016  . Syncope 12/06/2016  . ETOH abuse 12/06/2016  . PAD (peripheral artery disease) (HCC) 12/06/2016  . LVH (left ventricular hypertrophy)  05/26/2016  . Elevated troponin 05/24/2016  . History of lupus nephritis 03/23/2011  . Raynaud's phenomenon 03/23/2011  . HYPOKALEMIA 08/19/2008  . FATTY LIVER DISEASE 05/09/2008  . WEIGHT LOSS 03/28/2008  . RAYNAUDS SYNDROME 02/15/2008  . CARDIAC MURMUR 11/16/2007  . Systemic lupus erythematosus (HCC) 05/25/2006  . GOUT 02/02/2006  . DEPRESSION 02/02/2006  . MIGRAINE HEADACHE 02/02/2006  . Essential hypertension 02/02/2006  . ALLERGIC RHINITIS 02/02/2006  . Asthma 02/02/2006  . GERD 02/02/2006  . IBS 02/02/2006  . OVERACTIVE BLADDER 02/02/2006   Past Medical History:  Diagnosis Date  . Anginal pain (HCC) 2020  . Arthritis    Knees, hips,  . CHF (congestive heart failure) (HCC)   . Elevated troponin 05/24/2016  . Finger amputation, no complication   . GERD (gastroesophageal reflux disease)   . Gout   . Heart murmur    never has caused any problems per patient 04/02/20  . Hypercholesteremia   . Hypertension   . 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 Lupus (HCC)     Family History  Problem Relation Age of Onset  . Pancreatic cancer Mother   . Colon cancer Father     Past Surgical History:  Procedure Laterality Date  . AMPUTATION Left 04/04/2020   Procedure: LEFT ABOVE KNEE AMPUTATION;  Surgeon: Nadara Mustard, MD;  Location: [facility] OR;  Service: Orthopedics;  Laterality: Left;  . CARDIAC CATHETERIZATION  05/25/2016  . COLONOSCOPY N/A 01/17/2019   Procedure: COLONOSCOPY;  Surgeon: Corbin Ade, MD;  [facility];  Service: Endoscopy;  Laterality: N/A;  10:30  . EXCISIONAL TOTAL KNEE ARTHROPLASTY Left 02/05/2020   Procedure: REPEAT IRRIGATION AND DEBRIDEMENT LEFT KNEE;  Surgeon: [name], MD;  Location: [facility] ORS;  Service: Orthopedics;  Laterality: Left;  . I & D EXTREMITY Left 02/08/2020   Procedure: EXCISIONAL DEBRIDEMENT LEFT KNEE;  Surgeon: Nadara Mustard, MD;  Location: [facility] OR;  Service: Orthopedics;  Laterality: Left;  . I &  D KNEE WITH POLY EXCHANGE Left 01/25/2020   Procedure: INCISION AND DRAINAGE LEFT KNEE WITH POLY-LINER EXCHANGE;  Surgeon: [name], MD;  Location: [facility];  Service: Orthopedics;  Laterality: Left;  . IRRIGATION AND DEBRIDEMENT KNEE Left 01/30/2020   Procedure: REPEAT IRRIGATION AND DEBRIDEMENT LEFT KNEE PLACEMENT OF WOUND VAC;  Surgeon: [name], MD;  Location: [facility];  Service: Orthopedics;  Laterality: Left;  . left finger amputations    . POLYPECTOMY  01/17/2019   Procedure: POLYPECTOMY;  Surgeon: Corbin Ade, MD;  [facility];  Service: Endoscopy;;  . right hand surgery    . right knee arthroscopy    . RIGHT/LEFT HEART CATH AND CORONARY ANGIOGRAPHY N/A 05/25/2016   Procedure: Right/Left Heart Cath  and Coronary Angiography;  Surgeon: Lennette Bihari, MD;  Location: [facility] INVASIVE CV LAB;  Service: Cardiovascular;  Laterality: N/A;  . SKIN SPLIT GRAFT Left 02/08/2020   Procedure: SKIN GRAFT SPLIT THICKNESS LEFT KNEE;  Surgeon: Nadara Mustard, MD;  Location: [facility] OR;  Service: Orthopedics;  Laterality: Left;  . TOTAL KNEE ARTHROPLASTY Left 01/04/2020   Procedure: LEFT TOTAL KNEE ARTHROPLASTY;  Surgeon: [name], MD;  Location: [facility];  Service: Orthopedics;  Laterality: Left;   Social History   Occupational History  . Occupation: unemployed  Tobacco Use  . Smoking status: Current Every Day Smoker    Packs/day: 0.10    Years: 15.00    Pack years: 1.50    Types: Cigarettes  . Smokeless tobacco: Never Used  Vaping Use  . Vaping Use: Never used  Substance and Sexual Activity  . Alcohol use: Yes    Alcohol/week: 2.0 - 3.0 standard drinks    Types: 2 - 3 Cans of beer per week    Comment: 03/18/20   "somedays none"  . Drug use: Yes    Types: Marijuana    Comment: Last time 04/02/20  . Sexual activity: Not Currently    Birth control/protection: Post-menopausal

## 2020-04-22 ENCOUNTER — Encounter: Payer: Self-pay | Admitting: Adult Health

## 2020-04-22 ENCOUNTER — Non-Acute Institutional Stay (SKILLED_NURSING_FACILITY): Payer: Medicare Other | Admitting: Adult Health

## 2020-04-22 DIAGNOSIS — F339 Major depressive disorder, recurrent, unspecified: Secondary | ICD-10-CM | POA: Diagnosis not present

## 2020-04-22 DIAGNOSIS — T8459XS Infection and inflammatory reaction due to other internal joint prosthesis, sequela: Secondary | ICD-10-CM

## 2020-04-22 DIAGNOSIS — Z96659 Presence of unspecified artificial knee joint: Secondary | ICD-10-CM

## 2020-04-22 DIAGNOSIS — I10 Essential (primary) hypertension: Secondary | ICD-10-CM | POA: Diagnosis not present

## 2020-04-22 DIAGNOSIS — I509 Heart failure, unspecified: Secondary | ICD-10-CM | POA: Diagnosis not present

## 2020-04-22 NOTE — Progress Notes (Signed)
Location:  [facility] Room Number: 119/A Place of Service:  SNF (31) Provider:  Kenard Gower, DNP, FNP-BC  Patient Care Team: Avon Gully, MD as PCP - General (Internal Medicine) Wyline Mood Dorothe Pea, MD as PCP - Cardiology (Cardiology) Jena Gauss Gerrit Friends, MD as Consulting Physician (Gastroenterology)  Extended Emergency Contact Information Primary Emergency Contact: Marcy Panning [place] Phone: [phone] Relation: Daughter Secondary Emergency Contact: Antionette Char Address: [address]          [place] [address] [place] Home Phone: [phone] Relation: Sister  Code Status: Full Code   Goals of care: Advanced Directive information Advanced Directives 04/22/2020  Does Patient Have a Medical Advance Directive? No  Would patient like information on creating a medical advance directive? -     Chief Complaint  Patient presents with  . Acute Visit    Short Term Rehab    HPI:  Pt is a 54 y.o. female seen today for a routine short-term rehabilitation visit.  She has a PMH of lupus, hypertension, gout, CHF and arthritis. She was admitted to [facility] on 04/11/2020 post hospitalization 04/04/20 to 04/11/2020 for infected left total knee arthroplasty for which she had left AKA done on 04/04/20.  She was seen in the room today.  Left AKA stump still has staples intact.  Surgical incision is dry and no erythema.  SBPs ranging from 80-133.  She takes her amlodipine 10 mg daily, atenolol 25 mg daily and benazepril 20 mg 1 tab daily for hypertension.  No SOB.  She takes furosemide and spironolactone for congestive heart failure.  No edema noted.  Past Medical History:  Diagnosis Date  . Anginal pain (HCC) 2020  . Arthritis    Knees, hips,  . CHF (congestive heart failure) (HCC)   . Elevated troponin 05/24/2016  . Finger amputation, no complication   . GERD (gastroesophageal reflux disease)    . Gout   . Heart murmur    never has caused any problems per patient 04/02/20  . Hypercholesteremia   . Hypertension   . 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 Lupus [facility])    Past Surgical History:  Procedure Laterality Date  . AMPUTATION Left 04/04/2020   Procedure: LEFT ABOVE KNEE AMPUTATION;  Surgeon: Nadara Mustard, MD;  Location: [facility] OR;  Service: Orthopedics;  Laterality: Left;  . CARDIAC CATHETERIZATION  05/25/2016  . COLONOSCOPY N/A 01/17/2019   Procedure: COLONOSCOPY;  Surgeon: Corbin Ade, MD;  [facility];  Service: Endoscopy;  Laterality: N/A;  10:30  . EXCISIONAL TOTAL KNEE ARTHROPLASTY Left 02/05/2020   Procedure: REPEAT IRRIGATION AND DEBRIDEMENT LEFT KNEE;  Surgeon: [name], MD;  Location: [facility];  Service: Orthopedics;  Laterality: Left;  . I & D EXTREMITY Left 02/08/2020   Procedure: EXCISIONAL DEBRIDEMENT LEFT KNEE;  Surgeon: Nadara Mustard, MD;  Location: [facility] OR;  Service: Orthopedics;  Laterality: Left;  . I & D KNEE WITH POLY EXCHANGE Left 01/25/2020   Procedure: INCISION AND DRAINAGE LEFT KNEE WITH POLY-LINER EXCHANGE;  Surgeon: [name], MD;  Location: [facility];  Service: Orthopedics;  Laterality: Left;  . IRRIGATION AND DEBRIDEMENT KNEE Left 01/30/2020   Procedure: REPEAT IRRIGATION AND DEBRIDEMENT LEFT KNEE PLACEMENT OF WOUND VAC;  Surgeon: [name], MD;  Location: [facility];  Service: Orthopedics;  Laterality: Left;  . left finger amputations    . POLYPECTOMY  01/17/2019   Procedure:  POLYPECTOMY;  Surgeon: Corbin Ade, MD;  [facility];  Service: Endoscopy;;  . right hand surgery    . right knee arthroscopy    . RIGHT/LEFT HEART CATH AND CORONARY ANGIOGRAPHY N/A 05/25/2016   Procedure: Right/Left Heart Cath and Coronary Angiography;  Surgeon: Lennette Bihari, MD;  Location: [facility] INVASIVE CV LAB;  Service: Cardiovascular;  Laterality: N/A;  . SKIN SPLIT GRAFT Left 02/08/2020    Procedure: SKIN GRAFT SPLIT THICKNESS LEFT KNEE;  Surgeon: Nadara Mustard, MD;  Location: [facility] OR;  Service: Orthopedics;  Laterality: Left;  . TOTAL KNEE ARTHROPLASTY Left 01/04/2020   Procedure: LEFT TOTAL KNEE ARTHROPLASTY;  Surgeon: [name], MD;  Location: [facility];  Service: Orthopedics;  Laterality: Left;    No Known Allergies  Outpatient Encounter Medications as of 04/22/2020  Medication Sig  . allopurinol (ZYLOPRIM) 100 MG tablet Take 1 tablet (100 mg total) by mouth daily.  [name] amLODipine (NORVASC) 10 MG tablet Take 1 tablet (10 mg total) by mouth daily.  [name] aspirin 81 MG chewable tablet Chew 1 tablet (81 mg total) by mouth 2 (two) times daily.  [name] atenolol (TENORMIN) 25 MG tablet Take 1 tablet (25 mg total) by mouth daily.  [name] atorvastatin (LIPITOR) 40 MG tablet Take 1 tablet (40 mg total) by mouth daily at 6 PM.  . benazepril (LOTENSIN) 40 MG tablet Take 1 tablet (40 mg total) by mouth daily.  . cetaphil (CETAPHIL) lotion Apply 1 application topically 2 (two) times daily.  . Ensure (ENSURE) Take 237 mLs by mouth daily.  [name] escitalopram (LEXAPRO) 10 MG tablet Take 10 mg by mouth daily.  . furosemide (LASIX) 40 MG tablet Take 1 tablet (40 mg total) by mouth daily.  [name] gabapentin (NEURONTIN) 300 MG capsule Take 300 mg by mouth daily.  [name] omeprazole (PRILOSEC) 20 MG capsule Take 20 mg by mouth daily.  [name] oxyCODONE (OXY IR/ROXICODONE) 5 MG immediate release tablet Take 1 tablet (5 mg total) by mouth every 6 (six) hours as needed for severe pain.  . potassium chloride (K-DUR) 10 MEQ tablet Take 10 mEq by mouth daily.   [name] zolpidem (AMBIEN) 10 MG tablet Take 10 mg by mouth at bedtime as needed for sleep.   . [DISCONTINUED] methocarbamol (ROBAXIN) 500 MG tablet Take 1 tablet (500 mg total) by mouth every 6 (six) hours as needed for muscle spasms.  . [DISCONTINUED] spironolactone (ALDACTONE) 25 MG tablet Take 25 mg by mouth daily.   No facility-administered encounter medications on  file as of 04/22/2020.    Review of Systems  GENERAL: No change in appetite, no fatigue, no weight changes, no fever, chills or weakness MOUTH and THROAT: Denies oral discomfort, gingival pain or bleeding, pain from teeth or hoarseness   RESPIRATORY: no cough, SOB, DOE, wheezing, hemoptysis CARDIAC: No chest pain, edema or palpitations GI: No abdominal pain, diarrhea, constipation, heart burn, nausea or vomiting NEUROLOGICAL: Denies dizziness, syncope, numbness, or headache PSYCHIATRIC: Denies feelings of depression or anxiety. No report of hallucinations, insomnia, paranoia, or agitation   Immunization History  Administered Date(s) Administered  . Influenza Whole 12/24/2005, 12/20/2006, 12/28/2007  . Pneumococcal Polysaccharide-23 05/03/2007  . Td,absorbed, Preservative Free, Adult Use, Lf Unspecified 06/12/1995, 06/20/2004   Pertinent  Health Maintenance Due  Topic Date Due  . PAP SMEAR-Modifier  Never done  . MAMMOGRAM  Never done  . INFLUENZA VACCINE  09/30/2019  . COLONOSCOPY (Pts 45-57yrs Insurance coverage will need to be confirmed)  01/16/2029  No flowsheet data found.   Vitals:   04/22/20 1027  BP: 133/87  Pulse: 68  Resp: 20  Temp: 99.1 F (37.3 C)  Weight: 125 lb (56.7 kg)  Height: 5\' 4"  (1.626 m)   Body mass index is 21.46 kg/m.  Physical Exam  GENERAL APPEARANCE: Well nourished. In no acute distress.  SKIN:  Left AKA stump with with staple, no erythema MOUTH and THROAT: Lips are without lesions. Oral mucosa is moist and without lesions. Tongue is normal in shape, size, and color and without lesions RESPIRATORY: Breathing is even & unlabored, BS CTAB CARDIAC: RRR, no murmur,no extra heart sounds, no edema GI: Abdomen soft, normal BS, no masses, no tenderness Extremities:  Left AKA stump with dressing NEUROLOGICAL: There is no tremor. Speech is clear. Alert and oriented X 3. PSYCHIATRIC:  Affect and behavior are appropriate  Labs reviewed: Recent  Labs    02/05/20 0417 02/09/20 0352 04/04/20 0705  NA 136 132* 135  K 3.7 4.5 3.5  CL 99 99 102  CO2 28 27 24   GLUCOSE 96 102* 98  BUN 5* 8 8  CREATININE 0.60 0.68 0.71  CALCIUM 8.6* 8.6* 9.1   Recent Labs    02/04/20 0315 04/04/20 0705  AST 37 26  ALT 11 14  ALKPHOS 90 89  BILITOT 1.1 1.1  PROT 6.8 7.1  ALBUMIN 2.5* 2.9*   Recent Labs    02/04/20 0315 02/05/20 0417 04/04/20 0705 04/05/20 0352 04/05/20 1604 04/06/20 0341  WBC 11.7*   < > 7.9 7.3  --  7.6  NEUTROABS 7.9*  --   --   --   --   --   HGB 9.5*   < > 10.7* 6.9* 8.6* 8.4*  HCT 30.2*   < > 35.6* 22.9* 27.5* 26.1*  MCV 96.2   < > 89.7 90.5  --  89.1  PLT 286   < > 262 180  --  164   < > = values in this interval not displayed.   Lab Results  Component Value Date   TSH 1.839 12/06/2016   Lab Results  Component Value Date   HGBA1C 4.6 (L) 05/25/2016   Lab Results  Component Value Date   CHOL 134 05/25/2016   HDL 39 (L) 05/25/2016   LDLCALC 82 05/25/2016   TRIG 63 05/25/2016   CHOLHDL 3.4 05/25/2016     Assessment/Plan  1. Essential hypertension -   BPs low, will decrease amlodipine from 10mg  daily to 5 mg daily -    Continue atenolol and benazepril  2. Infection of total knee replacement, sequela -   S/P Left AKA on 04/04/20 -We will decrease oxycodone 5 mg from every 6 hours PRN to every 8 hours PRN -   Continue PT and OT, for therapeutic strengthening exercises -   Follow-up with orthopedics  3. Chronic congestive heart failure, unspecified heart failure type (HCC) -  No SOB, continue furosemide and spironolactone  4. Major depression, recurrent, chronic (HCC) -   Mood this is stable, continue Escitalopram    Family/ staff Communication: Discussed plan of care with resident and charge nurse.  Labs/tests ordered: None  Goals of care:   Short-term care   05/27/2016, DNP, MSN, FNP-BC [facility] [phone] (Monday-Friday 8:00 a.m. - 5:00  p.m.) [phone] (after hours)

## 2020-04-25 ENCOUNTER — Encounter: Payer: Self-pay | Admitting: Adult Health

## 2020-04-25 ENCOUNTER — Non-Acute Institutional Stay (SKILLED_NURSING_FACILITY): Payer: Medicare Other | Admitting: Adult Health

## 2020-04-25 DIAGNOSIS — T8459XS Infection and inflammatory reaction due to other internal joint prosthesis, sequela: Secondary | ICD-10-CM | POA: Diagnosis not present

## 2020-04-25 DIAGNOSIS — Z96659 Presence of unspecified artificial knee joint: Secondary | ICD-10-CM | POA: Diagnosis not present

## 2020-04-25 DIAGNOSIS — F5101 Primary insomnia: Secondary | ICD-10-CM | POA: Diagnosis not present

## 2020-04-25 DIAGNOSIS — M1A9XX0 Chronic gout, unspecified, without tophus (tophi): Secondary | ICD-10-CM

## 2020-04-25 DIAGNOSIS — I509 Heart failure, unspecified: Secondary | ICD-10-CM | POA: Diagnosis not present

## 2020-04-25 DIAGNOSIS — G629 Polyneuropathy, unspecified: Secondary | ICD-10-CM | POA: Diagnosis not present

## 2020-04-25 DIAGNOSIS — E785 Hyperlipidemia, unspecified: Secondary | ICD-10-CM

## 2020-04-25 DIAGNOSIS — F339 Major depressive disorder, recurrent, unspecified: Secondary | ICD-10-CM

## 2020-04-25 DIAGNOSIS — I10 Essential (primary) hypertension: Secondary | ICD-10-CM

## 2020-04-25 DIAGNOSIS — K219 Gastro-esophageal reflux disease without esophagitis: Secondary | ICD-10-CM

## 2020-04-25 NOTE — Progress Notes (Signed)
Location:  [facility] Room Number: 119/A Place of Service:  SNF (31) Provider:  Kenard GowerMedina-Vargas, Ezel Vallone, DNP, FNP-BC  Patient Care Team: Avon GullyFanta, Tesfaye, MD as PCP - General (Internal Medicine) Wyline MoodBranch, Dorothe PeaJonathan F, MD as PCP - Cardiology (Cardiology) Jena Gaussourk, Gerrit Friendsobert M, MD as Consulting Physician (Gastroenterology)  Extended Emergency Contact Information Primary Emergency Contact: Crystal Patterson  [place] Mobile Phone: [phone] Relation: Daughter Secondary Emergency Contact: Crystal Patterson Address: [address]          [place] [id number] [place] Home Phone: [phone] Relation: Sister  Code Status: Full Code   Goals of care: Advanced Directive information Advanced Directives 04/25/2020  Does Patient Have a Medical Advance Directive? No  Does patient want to make changes to medical advance directive? No - Patient declined  Would patient like information on creating a medical advance directive? -     Chief Complaint  Patient presents with  . Discharge Note    Discharge Visit     HPI:  Pt is a 55 y.o. female who is for discharge home on 04/26/20 with Home health PT, OT and Nurse for wound treatment.  [facility] on 04[facility]20 post hospitalization 2/04/222 to 04/11/09. She was hospitalized for infected left total knee arthroplasty for which she had left above the knee amputation on 2/04/2.  Patient was admitted to this facility for short-term rehabilitation after the patient's recent hospitalization.  Patient has completed SNF rehabilitation and therapy has cleared the patient for discharge.    Past Medical History:  Diagnosis Date  . Anginal pain (HCC) 2020  . Arthritis    Knees, hips,  . CHF (congestive heart failure) (HCC)   . Elevated troponin 05/24/2016  . Finger amputation, no complication   . GERD (gastroesophageal reflux disease)   . Gout   . Heart murmur    never has  caused any problems per patient 04/02/20  . Hypercholesteremia   . Hypertension   . 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 Lupus [facility](HCC)    Past Surgical History:  Procedure Laterality Date  . AMPUTATION Left 04/04/2020   Procedure: LEFT ABOVE KNEE AMPUTATION;  Surgeon: Nadara Mustarduda, Marcus V, MD;  [facility];  Service: Orthopedics;  Laterality: Left;  . CARDIAC CATHETERIZATION  05/25/2016  . COLONOSCOPY N/A 01/17/2019   Procedure: COLONOSCOPY;  Surgeon: Corbin Adeourk, Robert M, MD;  Location: [facility] ENDO SUITE;  Service: Endoscopy;  Laterality: N/A;  10:30  . EXCISIONAL TOTAL KNEE ARTHROPLASTY Left 02/05/2020   Procedure: REPEAT IRRIGATION AND DEBRIDEMENT LEFT KNEE;  Surgeon: Kathryne HitchBlackman, Christopher Y, MD;  Location: [facility] ORS;  Service: Orthopedics;  Laterality: Left;  . I & D EXTREMITY Left 02/08/2020   Procedure: EXCISIONAL DEBRIDEMENT LEFT KNEE;  Surgeon: Nadara Mustarduda, Marcus V, MD;  [facility];  Service: Orthopedics;  Laterality: Left;  . I & D KNEE WITH POLY EXCHANGE Left 01/25/2020   Procedure: INCISION AND DRAINAGE LEFT KNEE WITH POLY-LINER EXCHANGE;  Surgeon: Kathryne HitchBlackman, Christopher Y, MD;  Location: [facility] ORS;  Service: Orthopedics;  Laterality: Left;  . IRRIGATION AND DEBRIDEMENT KNEE Left 01/30/2020   Procedure: REPEAT IRRIGATION AND DEBRIDEMENT LEFT KNEE PLACEMENT OF WOUND VAC;  Surgeon: Kathryne HitchBlackman, Christopher Y, MD;  Location: [facility] ORS;  Service: Orthopedics;  Laterality: Left;  . left finger amputations    . POLYPECTOMY  01/17/2019   Procedure: POLYPECTOMY;  Surgeon: Corbin Adeourk, Robert M, MD;  Location: [facility];  Service: Endoscopy;;  . right  hand surgery    . right knee arthroscopy    . RIGHT/LEFT HEART CATH AND CORONARY ANGIOGRAPHY N/A 05/25/2016   Procedure: Right/Left Heart Cath and Coronary Angiography;  Surgeon: Lennette Bihari, MD;  Location: [facility] INVASIVE CV LAB;  Service: Cardiovascular;  Laterality: N/A;  . SKIN SPLIT GRAFT Left 02/08/2020   Procedure: SKIN GRAFT SPLIT THICKNESS  LEFT KNEE;  Surgeon: Nadara Mustard, MD;  Location: [facility] OR;  Service: Orthopedics;  Laterality: Left;  . TOTAL KNEE ARTHROPLASTY Left 01/04/2020   Procedure: LEFT TOTAL KNEE ARTHROPLASTY;  Surgeon: [name], MD;  Location: [facility] ORS;  Service: Orthopedics;  Laterality: Left;    No Known Allergies  Outpatient Encounter Medications as of 04/25/2020  Medication Sig  . allopurinol (ZYLOPRIM) 100 MG tablet Take 1 tablet (100 mg total) by mouth daily.  [name] amLODipine (NORVASC) 10 MG tablet Take 1 tablet (10 mg total) by mouth daily.  [name] aspirin 81 MG chewable tablet Chew 1 tablet (81 mg total) by mouth 2 (two) times daily.  [name] atenolol (TENORMIN) 25 MG tablet Take 1 tablet (25 mg total) by mouth daily.  [name] atorvastatin (LIPITOR) 40 MG tablet Take 1 tablet (40 mg total) by mouth daily at 6 PM.  . benazepril (LOTENSIN) 40 MG tablet Take 1 tablet (40 mg total) by mouth daily.  . cetaphil (CETAPHIL) lotion Apply 1 application topically 2 (two) times daily.  . Ensure (ENSURE) Take 237 mLs by mouth daily.  [name] escitalopram (LEXAPRO) 10 MG tablet Take 10 mg by mouth daily.  . furosemide (LASIX) 40 MG tablet Take 1 tablet (40 mg total) by mouth daily.  [name] gabapentin (NEURONTIN) 300 MG capsule Take 300 mg by mouth daily.  [name] omeprazole (PRILOSEC) 20 MG capsule Take 20 mg by mouth daily.  [name] oxyCODONE (OXY IR/ROXICODONE) 5 MG immediate release tablet Take 5 mg by mouth every 8 (eight) hours as needed for severe pain.  . potassium chloride (K-DUR) 10 MEQ tablet Take 10 mEq by mouth daily.   [name] zolpidem (AMBIEN) 10 MG tablet Take 10 mg by mouth at bedtime as needed for sleep.   . [DISCONTINUED] oxyCODONE (OXY IR/ROXICODONE) 5 MG immediate release tablet Take 1 tablet (5 mg total) by mouth every 6 (six) hours as needed for severe pain.   No facility-administered encounter medications on file as of 04/25/2020.    Review of Systems  GENERAL: No change in appetite, no fatigue, no weight changes, no fever,  chills or weakness MOUTH and THROAT: Denies oral discomfort, gingival pain or bleeding RESPIRATORY: no cough, SOB, DOE, wheezing, hemoptysis CARDIAC: No chest pain, edema or palpitations GI: No abdominal pain, diarrhea, constipation, heart burn, nausea or vomiting GU: Denies dysuria, frequency, hematuria, incontinence, or discharge NEUROLOGICAL: Denies dizziness, syncope, numbness, or headache PSYCHIATRIC: Denies feelings of depression or anxiety. No report of hallucinations, insomnia, paranoia, or agitation   Immunization History  Administered Date(s) Administered  . Influenza Whole 12/24/2005, 12/20/2006, 12/28/2007  . Pneumococcal Polysaccharide-23 05/03/2007  . Td,absorbed, Preservative Free, Adult Use, Lf Unspecified 06/12/1995, 06/20/2004   Pertinent  Health Maintenance Due  Topic Date Due  . PAP SMEAR-Modifier  Never done  . MAMMOGRAM  Never done  . INFLUENZA VACCINE  09/30/2019  . COLONOSCOPY (Pts 45-2yrs Insurance coverage will need to be confirmed)  01/16/2029   No flowsheet data found.   Vitals:   04/25/20 1443  BP: (!) 87/46  Pulse: 63  Resp: 17  Temp: 97.9 F (36.6 C)  Weight: 121 lb  3.2 oz (55 kg)  Height: 5\' 4"  (1.626 m)   Body mass index is 20.8 kg/m.  Physical Exam  GENERAL APPEARANCE: Well nourished. In no acute distress.  SKIN:  Left AKA stump with staples, dry and no erythema MOUTH and THROAT: Lips are without lesions. Oral mucosa is moist and without lesions.  RESPIRATORY: Breathing is even & unlabored, BS CTAB CARDIAC: RRR, no murmur,no extra heart sounds, no edema. Left chest with portacath GI: Abdomen soft, normal BS, no masses, no tenderness NEUROLOGICAL: There is no tremor. Speech is clear. Alert and oriented X 3.  PSYCHIATRIC:  Affect and behavior are appropriate  Labs reviewed: Recent Labs    02/05/20 0417 02/09/20 0352 04/04/20 0705  NA 136 132* 135  K 3.7 4.5 3.5  CL 99 99 102  CO2 28 27 24   GLUCOSE 96 102* 98  BUN 5* 8 8   CREATININE 0.60 0.68 0.71  CALCIUM 8.6* 8.6* 9.1   Recent Labs    02/04/20 0315 04/04/20 0705  AST 37 26  ALT 11 14  ALKPHOS 90 89  BILITOT 1.1 1.1  PROT 6.8 7.1  ALBUMIN 2.5* 2.9*   Recent Labs    02/04/20 0315 02/05/20 0417 04/04/20 0705 04/05/20 0352 04/05/20 1604 04/06/20 0341  WBC 11.7*   < > 7.9 7.3  --  7.6  NEUTROABS 7.9*  --   --   --   --   --   HGB 9.5*   < > 10.7* 6.9* 8.6* 8.4*  HCT 30.2*   < > 35.6* 22.9* 27.5* 26.1*  MCV 96.2   < > 89.7 90.5  --  89.1  PLT 286   < > 262 180  --  164   < > = values in this interval not displayed.   Lab Results  Component Value Date   TSH 1.839 12/06/2016   Lab Results  Component Value Date   HGBA1C 4.6 (L) 05/25/2016   Lab Results  Component Value Date   CHOL 134 05/25/2016   HDL 39 (L) 05/25/2016   LDLCALC 82 05/25/2016   TRIG 63 05/25/2016   CHOLHDL 3.4 05/25/2016     Assessment/Plan  1. Infection of total knee replacement, sequela -  S/P Left AKA on 04/04/20 -  Follow up with orthopedics - oxyCODONE (OXY IR/ROXICODONE) 5 MG immediate release tablet; Take 1 tablet (5 mg total) by mouth every 8 (eight) hours as needed for severe pain.  Dispense: 15 tablet; Refill: 0  2. Essential hypertension -  BPs noted to be low, will discontinue Amlodipine and decrease Benazepril from 20 mg to 10 mg daily -  Monitor BPs at home - benazepril (LOTENSIN) 10 MG tablet; Take 1 tablet (10 mg total) by mouth daily.  Dispense: 30 tablet; Refill: 0 - atenolol (TENORMIN) 25 MG tablet; Take 1 tablet (25 mg total) by mouth daily.  Dispense: 30 tablet; Refill: 0  3. Chronic congestive heart failure, unspecified heart failure type (HCC) - furosemide (LASIX) 40 MG tablet; Take 1 tablet (40 mg total) by mouth daily.  Dispense: 30 tablet; Refill: 0 - potassium chloride (KLOR-CON) 10 MEQ tablet; Take 1 tablet (10 mEq total) by mouth daily.  Dispense: 30 tablet; Refill: 0  4. Gastroesophageal reflux disease without esophagitis -  omeprazole (PRILOSEC) 20 MG capsule; Take 1 capsule (20 mg total) by mouth daily.  Dispense: 30 capsule; Refill: 0  5. Chronic gout without tophus, unspecified cause, unspecified site - allopurinol (ZYLOPRIM) 100 MG tablet; Take 1 tablet (  100 mg total) by mouth daily.  Dispense: 30 tablet; Refill: 0  6. Major depression, recurrent, chronic (HCC) - escitalopram (LEXAPRO) 10 MG tablet; Take 1 tablet (10 mg total) by mouth daily.  Dispense: 30 tablet; Refill: 0  7. Hyperlipidemia, unspecified hyperlipidemia type - atorvastatin (LIPITOR) 40 MG tablet; Take 1 tablet (40 mg total) by mouth daily at 6 PM.  Dispense: 30 tablet; Refill: 0  8. Primary insomnia - zolpidem (AMBIEN) 10 MG tablet; Take 1 tablet (10 mg total) by mouth at bedtime as needed for sleep.  Dispense: 15 tablet; Refill: 0  9. Neuropathy - gabapentin (NEURONTIN) 300 MG capsule; Take 1 capsule (300 mg total) by mouth daily.  Dispense: 30 capsule; Refill: 0     I have filled out patient's discharge paperwork and e-prescribed medications.  Patient will have home health PT, OT and Nurse for treatment.  Treatment -  Cleanse left AKA site with NS.  Apply dry dressing and wrapped with ACE wrap daily.  DME provided:  Wheelchair with cushion, shower bench, stump support, anti-tippers and leg rests  Wheelchair -patient is S/P left AKA which impairs her ability to perform daily activities like toileting, feeding, dressing, grooming and bathing in the home.  A cane or walker will not resolve the issue with performing activities of daily living.  A wheelchair will allow patient to safely perform daily activities.  Patient can safely propel the wheelchair in the home.  Total discharge time: Greater than 30 minutes  Discharge time involved coordination of the discharge process with social worker, nursing staff and therapy department. Medical justification for home health services/DME verified.    Kenard Gower, DNP, MSN,  FNP-BC [facility] [phone] (Monday-Friday 8:00 a.m. - 5:00 p.m.) [phone] (after hours)

## 2020-04-26 MED ORDER — ATORVASTATIN CALCIUM 40 MG PO TABS: 40.0000 mg | ORAL_TABLET | Freq: Every day | ORAL | 0 refills | Status: AC

## 2020-04-26 MED ORDER — ALLOPURINOL 100 MG PO TABS: 100.0000 mg | ORAL_TABLET | Freq: Every day | ORAL | 0 refills | Status: AC

## 2020-04-26 MED ORDER — ATENOLOL 25 MG PO TABS: 25.0000 mg | ORAL_TABLET | Freq: Every day | ORAL | 0 refills | Status: DC

## 2020-04-26 MED ORDER — POTASSIUM CHLORIDE ER 10 MEQ PO TBCR
10.0000 meq | EXTENDED_RELEASE_TABLET | Freq: Every day | ORAL | 0 refills | Status: AC
Start: 2020-04-26 — End: ?

## 2020-04-26 MED ORDER — BENAZEPRIL HCL 10 MG PO TABS: 10.0000 mg | ORAL_TABLET | Freq: Every day | ORAL | 0 refills | Status: DC

## 2020-04-26 MED ORDER — OXYCODONE HCL 5 MG PO TABS: 5.0000 mg | ORAL_TABLET | Freq: Three times a day (TID) | ORAL | 0 refills | Status: DC | PRN

## 2020-04-26 MED ORDER — OMEPRAZOLE 20 MG PO CPDR
20.0000 mg | DELAYED_RELEASE_CAPSULE | Freq: Every day | ORAL | 0 refills | Status: AC
Start: 2020-04-26 — End: ?

## 2020-04-26 MED ORDER — ZOLPIDEM TARTRATE 10 MG PO TABS: 10.0000 mg | ORAL_TABLET | Freq: Every evening | ORAL | 0 refills | Status: DC | PRN

## 2020-04-26 MED ORDER — FUROSEMIDE 40 MG PO TABS
40.0000 mg | ORAL_TABLET | Freq: Every day | ORAL | 0 refills | Status: AC
Start: 2020-04-26 — End: ?

## 2020-04-26 MED ORDER — GABAPENTIN 300 MG PO CAPS: 300.0000 mg | ORAL_CAPSULE | Freq: Every day | ORAL | 0 refills | Status: AC

## 2020-04-26 MED ORDER — ESCITALOPRAM OXALATE 10 MG PO TABS: 10.0000 mg | ORAL_TABLET | Freq: Every day | ORAL | 0 refills | Status: DC

## 2020-04-28 ENCOUNTER — Ambulatory Visit (INDEPENDENT_AMBULATORY_CARE_PROVIDER_SITE_OTHER): Payer: Medicaid Other | Admitting: Orthopedic Surgery

## 2020-04-28 ENCOUNTER — Encounter: Payer: Self-pay | Admitting: Orthopedic Surgery

## 2020-04-28 DIAGNOSIS — Z89612 Acquired absence of left leg above knee: Secondary | ICD-10-CM

## 2020-04-28 MED ORDER — METHOCARBAMOL 500 MG PO TABS: 500.0000 mg | ORAL_TABLET | Freq: Three times a day (TID) | ORAL | 0 refills | Status: DC | PRN

## 2020-04-28 NOTE — Addendum Note (Signed)
Addended by: Aldean Baker on: 04/28/2020 01:55 PM   Modules accepted: Orders

## 2020-04-28 NOTE — Progress Notes (Signed)
Office Visit Note   Patient: Crystal Patterson           Date of Birth: 02-25-1966           MRN: [id number] Visit Date: 04/28/2020              Requested by: Avon Gully, MD [address] [place],  [place] [address] PCP: Avon Gully, MD  Chief Complaint  Patient presents with  . Left Leg - Routine Post Op    04/04/20 left AKA       HPI: Patient presents 3 weeks status post left above-the-knee amputation patient states she is doing well has no complaints.  Assessment & Plan: Visit Diagnoses:  1. S/P AKA (above knee amputation) unilateral, left (HCC)     Plan: Patient was given a prescription for [employer] to start limb consolidation for a prosthesis on the left. Patient will be a K2 level ambulator patient will need to start with a shrinker first staples are harvested today.  Follow-Up Instructions: Return in about 3 weeks (around 05/19/2020).   Ortho Exam  Patient is alert, oriented, no adenopathy, well-dressed, normal affect, normal respiratory effort. Examination the incision is well-healed there is no cellulitis no drainage no signs of infection staples are harvested she is given a prescription for [employer] for prosthetic shrinker and prosthetic fitting.  Imaging: No results found. No images are attached to the encounter.  Labs: Lab Results  Component Value Date   HGBA1C 4.6 (L) 05/25/2016   ESRSEDRATE 42 (H) 02/04/2020   ESRSEDRATE 70 (H) 01/26/2020   ESRSEDRATE 6 07/09/2008   CRP 8.4 (H) 02/04/2020   CRP 14.8 (H) 01/26/2020   CRP 0.3 07/09/2008   REPTSTATUS 02/13/2020 FINAL 02/08/2020   GRAMSTAIN NO WBC SEEN NO ORGANISMS SEEN  02/08/2020   CULT  02/08/2020    RARE PROTEUS MIRABILIS DR. BLACKMAN NOTIFIED VIA EPIC MESSAGE REGARDING CULTURE GROWTH NO ANAEROBES ISOLATED Performed at [facility], [address] [place], [place] [address]    LABORGA PROTEUS MIRABILIS 02/08/2020     Lab Results  Component Value Date   ALBUMIN 2.9 (L)  04/04/2020   ALBUMIN 2.5 (L) 02/04/2020   ALBUMIN 3.4 (L) 12/06/2016    Lab Results  Component Value Date   MG 1.5 (L) 12/06/2016   MG 1.4 (L) 05/25/2016   No results found for: VD25OH  No results found for: PREALBUMIN CBC EXTENDED Latest Ref Rng & Units 04/06/2020 04/05/2020 04/05/2020  WBC 4.0 - 10.5 K/uL 7.6 - 7.3  RBC 3.87 - 5.11 MIL/uL 2.93(L) - 2.53(L)  HGB 12.0 - 15.0 g/dL 2.2(W) 9.7(L) 6.9(LL)  HCT 36.0 - 46.0 % 26.1(L) 27.5(L) 22.9(L)  PLT 150 - 400 K/uL 164 - 180  NEUTROABS 1.7 - 7.7 K/uL - - -  LYMPHSABS 0.7 - 4.0 K/uL - - -     There is no height or weight on file to calculate BMI.  Orders:  No orders of the defined types were placed in this encounter.  No orders of the defined types were placed in this encounter.    Procedures: No procedures performed  Clinical Data: No additional findings.  ROS:  All other systems negative, except as noted in the HPI. Review of Systems  Objective: Vital Signs: LMP  (LMP Unknown)   Specialty Comments:  No specialty comments available.  PMFS History: Patient Active Problem List   Diagnosis Date Noted  . Hypoalbuminemia 04/15/2020  . At risk for adverse drug event 04/15/2020  .  Knee osteomyelitis, left (HCC) 04/04/2020  . Failed total knee arthroplasty, sequela   . Open knee wound, left, sequela   . Infection of total knee replacement (HCC) 01/25/2020  . 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 Thrombosis of left ulnar artery (HCC) 01/23/2020  . Status post total left knee replacement 01/06/2020  . Unilateral primary osteoarthritis, left knee 09/26/2019  . Unilateral primary osteoarthritis, right knee 09/26/2019  . Systemic lupus erythematosus arthritis (HCC) 09/12/2019  . Arthritis of both knees 09/12/2019  . CAD (coronary artery disease) 07/14/2017  . Tobacco use disorder 12/30/2016  . Metabolic bone disease 12/30/2016  . Marijuana use 12/30/2016  . Syncope 12/06/2016  . ETOH  abuse 12/06/2016  . PAD (peripheral artery disease) (HCC) 12/06/2016  . LVH (left ventricular hypertrophy) 05/26/2016  . Elevated troponin 05/24/2016  . History of lupus nephritis 03/23/2011  . Raynaud's phenomenon 03/23/2011  . HYPOKALEMIA 08/19/2008  . FATTY LIVER DISEASE 05/09/2008  . WEIGHT LOSS 03/28/2008  . RAYNAUDS SYNDROME 02/15/2008  . CARDIAC MURMUR 11/16/2007  . Systemic lupus erythematosus (HCC) 05/25/2006  . GOUT 02/02/2006  . DEPRESSION 02/02/2006  . MIGRAINE HEADACHE 02/02/2006  . Essential hypertension 02/02/2006  . ALLERGIC RHINITIS 02/02/2006  . Asthma 02/02/2006  . GERD 02/02/2006  . IBS 02/02/2006  . OVERACTIVE BLADDER 02/02/2006   Past Medical History:  Diagnosis Date  . Anginal pain (HCC) 2020  . Arthritis    Knees, hips,  . CHF (congestive heart failure) (HCC)   . Elevated troponin 05/24/2016  . Finger amputation, no complication   . GERD (gastroesophageal reflux disease)   . Gout   . Heart murmur    never has caused any problems per patient 04/02/20  . Hypercholesteremia   . Hypertension   . 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 Lupus (HCC)     Family History  Problem Relation Age of Onset  . Pancreatic cancer Mother   . Colon cancer Father     Past Surgical History:  Procedure Laterality Date  . AMPUTATION Left 04/04/2020   Procedure: LEFT ABOVE KNEE AMPUTATION;  Surgeon: Nadara Mustard, MD;  Location[facility] OR;  Service: Orthopedics;  Laterality: Left;  . CARDIAC CATHETERIZATION  05/25/2016  . COLONOSCOPY N/A 01/17/2019   Procedure: COLONOSCOPY;  Surgeon: Corbin Ade, MD;  [facility];  Service: Endoscopy;  Laterality: N/A;  10:30  . EXCISIONAL TOTAL KNEE ARTHROPLASTY Left 02/05/2020   Procedure: REPEAT IRRIGATION AND DEBRIDEMENT LEFT KNEE;  Surgeon: [name], MD;  Location: [facility] ORS;  Service: Orthopedics;  Laterality: Left;  . I & D EXTREMITY Left 02/08/2020   Procedure: EXCISIONAL DEBRIDEMENT  LEFT KNEE;  Surgeon: Nadara Mustard, MD;  Location: [facility] OR;  Service: Orthopedics;  Laterality: Left;  . I & D KNEE WITH POLY EXCHANGE Left 01/25/2020   Procedure: INCISION AND DRAINAGE LEFT KNEE WITH POLY-LINER EXCHANGE;  Surgeon: [name], MD;  Location: [facility] ORS;  Service: Orthopedics;  Laterality: Left;  . IRRIGATION AND DEBRIDEMENT KNEE Left 01/30/2020   Procedure: REPEAT IRRIGATION AND DEBRIDEMENT LEFT KNEE PLACEMENT OF WOUND VAC;  Surgeon: [name], MD;  Location: [facility]S;  Service: Orthopedics;  Laterality: Left;  . left finger amputations    . POLYPECTOMY  01/17/2019   Procedure: POLYPECTOMY;  Surgeon: Corbin Ade, MD;  Location[facility];  Service: Endoscopy;;  . right hand surgery    . right knee arthroscopy    .  RIGHT/LEFT HEART CATH AND CORONARY ANGIOGRAPHY N/A 05/25/2016   Procedure: Right/Left Heart Cath and Coronary Angiography;  Surgeon: Lennette Bihari, MD;  Location: [facility] INV[facility] LAB;  Service: Cardiovascular;  Laterality: N/A;  . SKIN SPLIT GRAFT Left 02/08/2020   Procedure: SKIN GRAFT SPLIT THICKNESS LEFT KNEE;  Surgeon: Nadara Mustard, MD;  Location: [facility] OR;  Service: Orthopedics;  Laterality: Left;  . TOTAL KNEE ARTHROPLASTY Left 01/04/2020   Procedure: LEFT TOTAL KNEE ARTHROPLASTY;  Surgeon: [name], MD;  Location: [facility]S;  Service: Orthopedics;  Laterality: Left;   Social History   Occupational History  . Occupation: unemployed  Tobacco Use  . Smoking status: Current Every Day Smoker    Packs/day: 0.10    Years: 15.00    Pack years: 1.50    Types: Cigarettes  . Smokeless tobacco: Never Used  Vaping Use  . Vaping Use: Never used  Substance and Sexual Activity  . Alcohol use: Yes    Alcohol/week: 2.0 - 3.0 standard drinks    Types: 2 - 3 Cans of beer per week    Comment: 03/18/20   "somedays none"  . Drug use: Yes    Types: Marijuana    Comment: Last time 04/02/20  . Sexual activity: Not Currently    Birth  control/protection: Post-menopausal

## 2020-04-30 DIAGNOSIS — F17210 Nicotine dependence, cigarettes, uncomplicated: Secondary | ICD-10-CM | POA: Diagnosis not present

## 2020-04-30 DIAGNOSIS — I509 Heart failure, unspecified: Secondary | ICD-10-CM | POA: Diagnosis not present

## 2020-04-30 DIAGNOSIS — I110 Hypertensive heart disease with heart failure: Secondary | ICD-10-CM | POA: Diagnosis not present

## 2020-04-30 DIAGNOSIS — T8131XD Disruption of external operation (surgical) wound, not elsewhere classified, subsequent encounter: Secondary | ICD-10-CM | POA: Diagnosis not present

## 2020-04-30 DIAGNOSIS — M329 Systemic lupus erythematosus, unspecified: Secondary | ICD-10-CM | POA: Diagnosis not present

## 2020-04-30 DIAGNOSIS — T8454XD Infection and inflammatory reaction due to internal left knee prosthesis, subsequent encounter: Secondary | ICD-10-CM | POA: Diagnosis not present

## 2020-05-02 DIAGNOSIS — Z89612 Acquired absence of left leg above knee: Secondary | ICD-10-CM | POA: Diagnosis not present

## 2020-05-02 DIAGNOSIS — M329 Systemic lupus erythematosus, unspecified: Secondary | ICD-10-CM | POA: Diagnosis not present

## 2020-05-02 DIAGNOSIS — T8131XD Disruption of external operation (surgical) wound, not elsewhere classified, subsequent encounter: Secondary | ICD-10-CM | POA: Diagnosis not present

## 2020-05-02 DIAGNOSIS — I509 Heart failure, unspecified: Secondary | ICD-10-CM | POA: Diagnosis not present

## 2020-05-02 DIAGNOSIS — I7300 Raynaud's syndrome without gangrene: Secondary | ICD-10-CM | POA: Diagnosis not present

## 2020-05-02 DIAGNOSIS — I10 Essential (primary) hypertension: Secondary | ICD-10-CM | POA: Diagnosis not present

## 2020-05-02 DIAGNOSIS — F17210 Nicotine dependence, cigarettes, uncomplicated: Secondary | ICD-10-CM | POA: Diagnosis not present

## 2020-05-02 DIAGNOSIS — M109 Gout, unspecified: Secondary | ICD-10-CM | POA: Diagnosis not present

## 2020-05-02 DIAGNOSIS — I110 Hypertensive heart disease with heart failure: Secondary | ICD-10-CM | POA: Diagnosis not present

## 2020-05-02 DIAGNOSIS — T8454XD Infection and inflammatory reaction due to internal left knee prosthesis, subsequent encounter: Secondary | ICD-10-CM | POA: Diagnosis not present

## 2020-05-05 DIAGNOSIS — T8131XD Disruption of external operation (surgical) wound, not elsewhere classified, subsequent encounter: Secondary | ICD-10-CM | POA: Diagnosis not present

## 2020-05-05 DIAGNOSIS — T8454XD Infection and inflammatory reaction due to internal left knee prosthesis, subsequent encounter: Secondary | ICD-10-CM | POA: Diagnosis not present

## 2020-05-05 DIAGNOSIS — I110 Hypertensive heart disease with heart failure: Secondary | ICD-10-CM | POA: Diagnosis not present

## 2020-05-05 DIAGNOSIS — I509 Heart failure, unspecified: Secondary | ICD-10-CM | POA: Diagnosis not present

## 2020-05-05 DIAGNOSIS — M329 Systemic lupus erythematosus, unspecified: Secondary | ICD-10-CM | POA: Diagnosis not present

## 2020-05-05 DIAGNOSIS — F17210 Nicotine dependence, cigarettes, uncomplicated: Secondary | ICD-10-CM | POA: Diagnosis not present

## 2020-05-06 DIAGNOSIS — F17210 Nicotine dependence, cigarettes, uncomplicated: Secondary | ICD-10-CM | POA: Diagnosis not present

## 2020-05-06 DIAGNOSIS — I110 Hypertensive heart disease with heart failure: Secondary | ICD-10-CM | POA: Diagnosis not present

## 2020-05-06 DIAGNOSIS — Z4781 Encounter for orthopedic aftercare following surgical amputation: Secondary | ICD-10-CM | POA: Diagnosis not present

## 2020-05-06 DIAGNOSIS — M329 Systemic lupus erythematosus, unspecified: Secondary | ICD-10-CM | POA: Diagnosis not present

## 2020-05-06 DIAGNOSIS — I509 Heart failure, unspecified: Secondary | ICD-10-CM | POA: Diagnosis not present

## 2020-05-06 DIAGNOSIS — T8454XD Infection and inflammatory reaction due to internal left knee prosthesis, subsequent encounter: Secondary | ICD-10-CM | POA: Diagnosis not present

## 2020-05-06 DIAGNOSIS — M6281 Muscle weakness (generalized): Secondary | ICD-10-CM | POA: Diagnosis not present

## 2020-05-06 DIAGNOSIS — T8131XD Disruption of external operation (surgical) wound, not elsewhere classified, subsequent encounter: Secondary | ICD-10-CM | POA: Diagnosis not present

## 2020-05-06 DIAGNOSIS — Z89612 Acquired absence of left leg above knee: Secondary | ICD-10-CM | POA: Diagnosis not present

## 2020-05-07 DIAGNOSIS — M329 Systemic lupus erythematosus, unspecified: Secondary | ICD-10-CM | POA: Diagnosis not present

## 2020-05-07 DIAGNOSIS — T8454XD Infection and inflammatory reaction due to internal left knee prosthesis, subsequent encounter: Secondary | ICD-10-CM | POA: Diagnosis not present

## 2020-05-07 DIAGNOSIS — F17210 Nicotine dependence, cigarettes, uncomplicated: Secondary | ICD-10-CM | POA: Diagnosis not present

## 2020-05-07 DIAGNOSIS — I509 Heart failure, unspecified: Secondary | ICD-10-CM | POA: Diagnosis not present

## 2020-05-07 DIAGNOSIS — I110 Hypertensive heart disease with heart failure: Secondary | ICD-10-CM | POA: Diagnosis not present

## 2020-05-07 DIAGNOSIS — T8131XD Disruption of external operation (surgical) wound, not elsewhere classified, subsequent encounter: Secondary | ICD-10-CM | POA: Diagnosis not present

## 2020-05-09 DIAGNOSIS — I509 Heart failure, unspecified: Secondary | ICD-10-CM | POA: Diagnosis not present

## 2020-05-09 DIAGNOSIS — T8454XD Infection and inflammatory reaction due to internal left knee prosthesis, subsequent encounter: Secondary | ICD-10-CM | POA: Diagnosis not present

## 2020-05-09 DIAGNOSIS — F17210 Nicotine dependence, cigarettes, uncomplicated: Secondary | ICD-10-CM | POA: Diagnosis not present

## 2020-05-09 DIAGNOSIS — M329 Systemic lupus erythematosus, unspecified: Secondary | ICD-10-CM | POA: Diagnosis not present

## 2020-05-09 DIAGNOSIS — T8131XD Disruption of external operation (surgical) wound, not elsewhere classified, subsequent encounter: Secondary | ICD-10-CM | POA: Diagnosis not present

## 2020-05-09 DIAGNOSIS — I110 Hypertensive heart disease with heart failure: Secondary | ICD-10-CM | POA: Diagnosis not present

## 2020-05-12 ENCOUNTER — Encounter: Payer: Self-pay | Admitting: Orthopedic Surgery

## 2020-05-12 ENCOUNTER — Other Ambulatory Visit: Payer: Self-pay

## 2020-05-12 ENCOUNTER — Ambulatory Visit (INDEPENDENT_AMBULATORY_CARE_PROVIDER_SITE_OTHER): Payer: Medicaid Other | Admitting: Physician Assistant

## 2020-05-12 DIAGNOSIS — I7300 Raynaud's syndrome without gangrene: Secondary | ICD-10-CM | POA: Diagnosis not present

## 2020-05-12 DIAGNOSIS — T8454XD Infection and inflammatory reaction due to internal left knee prosthesis, subsequent encounter: Secondary | ICD-10-CM | POA: Diagnosis not present

## 2020-05-12 DIAGNOSIS — I739 Peripheral vascular disease, unspecified: Secondary | ICD-10-CM | POA: Diagnosis not present

## 2020-05-12 DIAGNOSIS — T8131XD Disruption of external operation (surgical) wound, not elsewhere classified, subsequent encounter: Secondary | ICD-10-CM | POA: Diagnosis not present

## 2020-05-12 DIAGNOSIS — I110 Hypertensive heart disease with heart failure: Secondary | ICD-10-CM | POA: Diagnosis not present

## 2020-05-12 DIAGNOSIS — I509 Heart failure, unspecified: Secondary | ICD-10-CM | POA: Diagnosis not present

## 2020-05-12 DIAGNOSIS — M329 Systemic lupus erythematosus, unspecified: Secondary | ICD-10-CM | POA: Diagnosis not present

## 2020-05-12 DIAGNOSIS — F17210 Nicotine dependence, cigarettes, uncomplicated: Secondary | ICD-10-CM | POA: Diagnosis not present

## 2020-05-12 DIAGNOSIS — Z89029 Acquired absence of unspecified finger(s): Secondary | ICD-10-CM | POA: Diagnosis not present

## 2020-05-12 DIAGNOSIS — T84018S Broken internal joint prosthesis, other site, sequela: Secondary | ICD-10-CM

## 2020-05-12 DIAGNOSIS — Z96659 Presence of unspecified artificial knee joint: Secondary | ICD-10-CM

## 2020-05-12 NOTE — Progress Notes (Signed)
Office Visit Note   Patient: Crystal Patterson           Date of Birth: 01/29/1966           MRN: [id number] Visit Date: 05/12/2020              Requested by: Avon Gully, MD [address] [place],  [place] [address] PCP: Avon Gully, MD  Chief Complaint  Patient presents with  . Left Leg - Routine Post Op    04/04/20 left AKA       HPI: Patient presents today 7 weeks status post left above-knee amputation.  She is doing well.  She is wearing a shrinker from [employer] and due to be casted at the end of this month.  Assessment & Plan: Visit Diagnoses: No diagnosis found.  Plan: Patient will follow up in 4 weeks.  At that time anticipate she will be close enough to getting her prosthetic we could refer her to physical therapy encouraged moisturizing the end of the stump with something like a cocoa butter Shea butter  Follow-Up Instructions: No follow-ups on file.   Ortho Exam  Patient is alert, oriented, no adenopathy, well-dressed, normal affect, normal respiratory effort. Well-healed amputation stump well apposed wound edges just ever so thin eschar that half by half with no surrounding cellulitis or erythema.  Swelling is very well controlled  Imaging: No results found. No images are attached to the encounter.  Labs: Lab Results  Component Value Date   HGBA1C 4.6 (L) 05/25/2016   ESRSEDRATE 42 (H) 02/04/2020   ESRSEDRATE 70 (H) 01/26/2020   ESRSEDRATE 6 07/09/2008   CRP 8.4 (H) 02/04/2020   CRP 14.8 (H) 01/26/2020   CRP 0.3 07/09/2008   REPTSTATUS 02/13/2020 FINAL 02/08/2020   GRAMSTAIN NO WBC SEEN NO ORGANISMS SEEN  02/08/2020   CULT  02/08/2020    RARE PROTEUS MIRABILIS DR. BLACKMAN NOTIFIED VIA EPIC MESSAGE REGARDING CULTURE GROWTH NO ANAEROBES ISOLATED Performed at [facility], [address], [place], [place] [address]    LABORGA PROTEUS MIRABILIS 02/08/2020     Lab Results  Component Value Date   ALBUMIN 2.9 (L) 04/04/2020   ALBUMIN  2.5 (L) 02/04/2020   ALBUMIN 3.4 (L) 12/06/2016    Lab Results  Component Value Date   MG 1.5 (L) 12/06/2016   MG 1.4 (L) 05/25/2016   No results found for: VD25OH  No results found for: PREALBUMIN CBC EXTENDED Latest Ref Rng & Units 04/06/2020 04/05/2020 04/05/2020  WBC 4.0 - 10.5 K/uL 7.6 - 7.3  RBC 3.87 - 5.11 MIL/uL 2.93(L) - 2.53(L)  HGB 12.0 - 15.0 g/dL 7.0(Y) 6.3(Z) 6.9(LL)  HCT 36.0 - 46.0 % 26.1(L) 27.5(L) 22.9(L)  PLT 150 - 400 K/uL 164 - 180  NEUTROABS 1.7 - 7.7 K/uL - - -  LYMPHSABS 0.7 - 4.0 K/uL - - -     There is no height or weight on file to calculate BMI.  Orders:  No orders of the defined types were placed in this encounter.  No orders of the defined types were placed in this encounter.    Procedures: No procedures performed  Clinical Data: No additional findings.  ROS:  All other systems negative, except as noted in the HPI. Review of Systems  Objective: Vital Signs: LMP  (LMP Unknown)   Specialty Comments:  No specialty comments available.  PMFS History: Patient Active Problem List   Diagnosis Date Noted  . Hypoalbuminemia 04/15/2020  . At risk  for adverse drug event 04/15/2020  . Knee osteomyelitis, left (HCC) 04/04/2020  . Failed total knee arthroplasty, sequela   . Open knee wound, left, sequela   . Infection of total knee replacement (HCC) 01/25/2020  . 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 Thrombosis of left ulnar artery (HCC) 01/23/2020  . Status post total left knee replacement 01/06/2020  . Unilateral primary osteoarthritis, left knee 09/26/2019  . Unilateral primary osteoarthritis, right knee 09/26/2019  . Systemic lupus erythematosus arthritis (HCC) 09/12/2019  . Arthritis of both knees 09/12/2019  . CAD (coronary artery disease) 07/14/2017  . Tobacco use disorder 12/30/2016  . Metabolic bone disease 12/30/2016  . Marijuana use 12/30/2016  . Syncope 12/06/2016  . ETOH abuse 12/06/2016  .  PAD (peripheral artery disease) (HCC) 12/06/2016  . LVH (left ventricular hypertrophy) 05/26/2016  . Elevated troponin 05/24/2016  . History of lupus nephritis 03/23/2011  . Raynaud's phenomenon 03/23/2011  . HYPOKALEMIA 08/19/2008  . FATTY LIVER DISEASE 05/09/2008  . WEIGHT LOSS 03/28/2008  . RAYNAUDS SYNDROME 02/15/2008  . CARDIAC MURMUR 11/16/2007  . Systemic lupus erythematosus (HCC) 05/25/2006  . GOUT 02/02/2006  . DEPRESSION 02/02/2006  . MIGRAINE HEADACHE 02/02/2006  . Essential hypertension 02/02/2006  . ALLERGIC RHINITIS 02/02/2006  . Asthma 02/02/2006  . GERD 02/02/2006  . IBS 02/02/2006  . OVERACTIVE BLADDER 02/02/2006   Past Medical History:  Diagnosis Date  . Anginal pain (HCC) 2020  . Arthritis    Knees, hips,  . CHF (congestive heart failure) (HCC)   . Elevated troponin 05/24/2016  . Finger amputation, no complication   . GERD (gastroesophageal reflux disease)   . Gout   . Heart murmur    never has caused any problems per patient 04/02/20  . Hypercholesteremia   . Hypertension   . 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 Lupus (HCC)     Family History  Problem Relation Age of Onset  . Pancreatic cancer Mother   . Colon cancer Father     Past Surgical History:  Procedure Laterality Date  . AMPUTATION Left 04/04/2020   Procedure: LEFT ABOVE KNEE AMPUTATION;  Surgeon: Nadara Mustard, MD;  Location: [facility] OR;  Service: Orthopedics;  Laterality: Left;  . CARDIAC CATHETERIZATION  05/25/2016  . COLONOSCOPY N/A 01/17/2019   Procedure: COLONOSCOPY;  Surgeon: Corbin Ade, MD;  [facility];  Service: Endoscopy;  Laterality: N/A;  10:30  . EXCISIONAL TOTAL KNEE ARTHROPLASTY Left 02/05/2020   Procedure: REPEAT IRRIGATION AND DEBRIDEMENT LEFT KNEE;  Surgeon: [name], MD;  Location: [facility];  Service: Orthopedics;  Laterality: Left;  . I & D EXTREMITY Left 02/08/2020   Procedure: EXCISIONAL DEBRIDEMENT LEFT KNEE;  Surgeon:  Nadara Mustard, MD;  Location: [facility] OR;  Service: Orthopedics;  Laterality: Left;  . I & D KNEE WITH POLY EXCHANGE Left 01/25/2020   Procedure: INCISION AND DRAINAGE LEFT KNEE WITH POLY-LINER EXCHANGE;  Surgeon: [name], MD;  Location: [facility];  Service: Orthopedics;  Laterality: Left;  . IRRIGATION AND DEBRIDEMENT KNEE Left 01/30/2020   Procedure: REPEAT IRRIGATION AND DEBRIDEMENT LEFT KNEE PLACEMENT OF WOUND VAC;  Surgeon: [name], MD;  Location: [facility];  Service: Orthopedics;  Laterality: Left;  . left finger amputations    . POLYPECTOMY  01/17/2019   Procedure: POLYPECTOMY;  Surgeon: Corbin Ade, MD;  Location: [facility];  Service: Endoscopy;;  . right hand surgery    .  right knee arthroscopy    . RIGHT/LEFT HEART CATH AND CORONARY ANGIOGRAPHY N/A 05/25/2016   Procedure: Right/Left Heart Cath and Coronary Angiography;  Surgeon: Lennette Bihari, MD;  Location: [facility];  Service: Cardiovascular;  Laterality: N/A;  . SKIN SPLIT GRAFT Left 02/08/2020   Procedure: SKIN GRAFT SPLIT THICKNESS LEFT KNEE;  Surgeon: Nadara Mustard, MD;  Location: [facility] OR;  Service: Orthopedics;  Laterality: Left;  . TOTAL KNEE ARTHROPLASTY Left 01/04/2020   Procedure: LEFT TOTAL KNEE ARTHROPLASTY;  Surgeon: [name], MD;  Location: [facility];  Service: Orthopedics;  Laterality: Left;   Social History   Occupational History  . Occupation: unemployed  Tobacco Use  . Smoking status: Current Every Day Smoker    Packs/day: 0.10    Years: 15.00    Pack years: 1.50    Types: Cigarettes  . Smokeless tobacco: Never Used  Vaping Use  . Vaping Use: Never used  Substance and Sexual Activity  . Alcohol use: Yes    Alcohol/week: 2.0 - 3.0 standard drinks    Types: 2 - 3 Cans of beer per week    Comment: 03/18/20   "somedays none"  . Drug use: Yes    Types: Marijuana    Comment: Last time 04/02/20  . Sexual activity: Not Currently    Birth control/protection:  Post-menopausal

## 2020-05-13 DIAGNOSIS — T8454XD Infection and inflammatory reaction due to internal left knee prosthesis, subsequent encounter: Secondary | ICD-10-CM | POA: Diagnosis not present

## 2020-05-13 DIAGNOSIS — T8131XD Disruption of external operation (surgical) wound, not elsewhere classified, subsequent encounter: Secondary | ICD-10-CM | POA: Diagnosis not present

## 2020-05-13 DIAGNOSIS — I110 Hypertensive heart disease with heart failure: Secondary | ICD-10-CM | POA: Diagnosis not present

## 2020-05-13 DIAGNOSIS — I509 Heart failure, unspecified: Secondary | ICD-10-CM | POA: Diagnosis not present

## 2020-05-13 DIAGNOSIS — F17210 Nicotine dependence, cigarettes, uncomplicated: Secondary | ICD-10-CM | POA: Diagnosis not present

## 2020-05-13 DIAGNOSIS — M329 Systemic lupus erythematosus, unspecified: Secondary | ICD-10-CM | POA: Diagnosis not present

## 2020-05-14 DIAGNOSIS — M329 Systemic lupus erythematosus, unspecified: Secondary | ICD-10-CM | POA: Diagnosis not present

## 2020-05-14 DIAGNOSIS — I509 Heart failure, unspecified: Secondary | ICD-10-CM | POA: Diagnosis not present

## 2020-05-14 DIAGNOSIS — T8454XD Infection and inflammatory reaction due to internal left knee prosthesis, subsequent encounter: Secondary | ICD-10-CM | POA: Diagnosis not present

## 2020-05-14 DIAGNOSIS — T8131XD Disruption of external operation (surgical) wound, not elsewhere classified, subsequent encounter: Secondary | ICD-10-CM | POA: Diagnosis not present

## 2020-05-14 DIAGNOSIS — F17210 Nicotine dependence, cigarettes, uncomplicated: Secondary | ICD-10-CM | POA: Diagnosis not present

## 2020-05-14 DIAGNOSIS — I110 Hypertensive heart disease with heart failure: Secondary | ICD-10-CM | POA: Diagnosis not present

## 2020-05-15 ENCOUNTER — Telehealth: Payer: Self-pay

## 2020-05-15 DIAGNOSIS — I509 Heart failure, unspecified: Secondary | ICD-10-CM | POA: Diagnosis not present

## 2020-05-15 DIAGNOSIS — T8454XD Infection and inflammatory reaction due to internal left knee prosthesis, subsequent encounter: Secondary | ICD-10-CM | POA: Diagnosis not present

## 2020-05-15 DIAGNOSIS — I110 Hypertensive heart disease with heart failure: Secondary | ICD-10-CM | POA: Diagnosis not present

## 2020-05-15 DIAGNOSIS — F17210 Nicotine dependence, cigarettes, uncomplicated: Secondary | ICD-10-CM | POA: Diagnosis not present

## 2020-05-15 DIAGNOSIS — M329 Systemic lupus erythematosus, unspecified: Secondary | ICD-10-CM | POA: Diagnosis not present

## 2020-05-15 DIAGNOSIS — T8131XD Disruption of external operation (surgical) wound, not elsewhere classified, subsequent encounter: Secondary | ICD-10-CM | POA: Diagnosis not present

## 2020-05-15 NOTE — Telephone Encounter (Signed)
Joni Reining from [facility] called in stating that the pt pain level Is still at a 7 and she said it is worse when she has the stocking on.

## 2020-05-16 ENCOUNTER — Telehealth: Payer: Self-pay | Admitting: Physician Assistant

## 2020-05-16 ENCOUNTER — Other Ambulatory Visit: Payer: Self-pay | Admitting: Physician Assistant

## 2020-05-16 DIAGNOSIS — T8459XS Infection and inflammatory reaction due to other internal joint prosthesis, sequela: Secondary | ICD-10-CM

## 2020-05-16 DIAGNOSIS — Z96659 Presence of unspecified artificial knee joint: Secondary | ICD-10-CM

## 2020-05-16 MED ORDER — OXYCODONE HCL 5 MG PO TABS: 5.0000 mg | ORAL_TABLET | Freq: Three times a day (TID) | ORAL | 0 refills | Status: DC | PRN

## 2020-05-16 MED ORDER — OXYCODONE HCL 5 MG PO TABS
5.0000 mg | ORAL_TABLET | Freq: Three times a day (TID) | ORAL | 0 refills | Status: DC | PRN
Start: 2020-05-16 — End: 2020-05-16

## 2020-05-16 NOTE — Telephone Encounter (Signed)
Pt is a left AKA 04/04/20 last refill on oxycodone 5 mg was 04/26/20 #15 and [facility] called to advise pt rates her pain at times a 7 out of 10 when wearing the shrinker and did not know if she could have a refill on medication. Please advise.

## 2020-05-16 NOTE — Telephone Encounter (Signed)
I called and lm on vm for pt to advise that rx has been sent to pharm.

## 2020-05-16 NOTE — Telephone Encounter (Signed)
done

## 2020-05-16 NOTE — Telephone Encounter (Signed)
FYI Patient returned call. I read note to patient  From [place] concerning Rx being sent to the pharmacy.

## 2020-05-18 ENCOUNTER — Other Ambulatory Visit: Payer: Self-pay | Admitting: Adult Health

## 2020-05-18 DIAGNOSIS — K219 Gastro-esophageal reflux disease without esophagitis: Secondary | ICD-10-CM

## 2020-05-18 DIAGNOSIS — I10 Essential (primary) hypertension: Secondary | ICD-10-CM

## 2020-05-19 DIAGNOSIS — T8131XD Disruption of external operation (surgical) wound, not elsewhere classified, subsequent encounter: Secondary | ICD-10-CM | POA: Diagnosis not present

## 2020-05-19 DIAGNOSIS — F17210 Nicotine dependence, cigarettes, uncomplicated: Secondary | ICD-10-CM | POA: Diagnosis not present

## 2020-05-19 DIAGNOSIS — M329 Systemic lupus erythematosus, unspecified: Secondary | ICD-10-CM | POA: Diagnosis not present

## 2020-05-19 DIAGNOSIS — T8454XD Infection and inflammatory reaction due to internal left knee prosthesis, subsequent encounter: Secondary | ICD-10-CM | POA: Diagnosis not present

## 2020-05-19 DIAGNOSIS — I509 Heart failure, unspecified: Secondary | ICD-10-CM | POA: Diagnosis not present

## 2020-05-19 DIAGNOSIS — I110 Hypertensive heart disease with heart failure: Secondary | ICD-10-CM | POA: Diagnosis not present

## 2020-05-20 DIAGNOSIS — I509 Heart failure, unspecified: Secondary | ICD-10-CM | POA: Diagnosis not present

## 2020-05-20 DIAGNOSIS — F17210 Nicotine dependence, cigarettes, uncomplicated: Secondary | ICD-10-CM | POA: Diagnosis not present

## 2020-05-20 DIAGNOSIS — T8131XD Disruption of external operation (surgical) wound, not elsewhere classified, subsequent encounter: Secondary | ICD-10-CM | POA: Diagnosis not present

## 2020-05-20 DIAGNOSIS — T8454XD Infection and inflammatory reaction due to internal left knee prosthesis, subsequent encounter: Secondary | ICD-10-CM | POA: Diagnosis not present

## 2020-05-20 DIAGNOSIS — M329 Systemic lupus erythematosus, unspecified: Secondary | ICD-10-CM | POA: Diagnosis not present

## 2020-05-20 DIAGNOSIS — I110 Hypertensive heart disease with heart failure: Secondary | ICD-10-CM | POA: Diagnosis not present

## 2020-05-21 DIAGNOSIS — T8131XD Disruption of external operation (surgical) wound, not elsewhere classified, subsequent encounter: Secondary | ICD-10-CM | POA: Diagnosis not present

## 2020-05-21 DIAGNOSIS — T8454XD Infection and inflammatory reaction due to internal left knee prosthesis, subsequent encounter: Secondary | ICD-10-CM | POA: Diagnosis not present

## 2020-05-21 DIAGNOSIS — I110 Hypertensive heart disease with heart failure: Secondary | ICD-10-CM | POA: Diagnosis not present

## 2020-05-21 DIAGNOSIS — F17210 Nicotine dependence, cigarettes, uncomplicated: Secondary | ICD-10-CM | POA: Diagnosis not present

## 2020-05-21 DIAGNOSIS — M329 Systemic lupus erythematosus, unspecified: Secondary | ICD-10-CM | POA: Diagnosis not present

## 2020-05-21 DIAGNOSIS — I509 Heart failure, unspecified: Secondary | ICD-10-CM | POA: Diagnosis not present

## 2020-05-22 ENCOUNTER — Other Ambulatory Visit: Payer: Self-pay | Admitting: Adult Health

## 2020-05-22 DIAGNOSIS — T8131XD Disruption of external operation (surgical) wound, not elsewhere classified, subsequent encounter: Secondary | ICD-10-CM | POA: Diagnosis not present

## 2020-05-22 DIAGNOSIS — I110 Hypertensive heart disease with heart failure: Secondary | ICD-10-CM | POA: Diagnosis not present

## 2020-05-22 DIAGNOSIS — F17210 Nicotine dependence, cigarettes, uncomplicated: Secondary | ICD-10-CM | POA: Diagnosis not present

## 2020-05-22 DIAGNOSIS — M329 Systemic lupus erythematosus, unspecified: Secondary | ICD-10-CM | POA: Diagnosis not present

## 2020-05-22 DIAGNOSIS — I10 Essential (primary) hypertension: Secondary | ICD-10-CM

## 2020-05-22 DIAGNOSIS — I509 Heart failure, unspecified: Secondary | ICD-10-CM | POA: Diagnosis not present

## 2020-05-22 DIAGNOSIS — T8454XD Infection and inflammatory reaction due to internal left knee prosthesis, subsequent encounter: Secondary | ICD-10-CM | POA: Diagnosis not present

## 2020-05-23 ENCOUNTER — Telehealth: Payer: Self-pay | Admitting: Orthopedic Surgery

## 2020-05-23 NOTE — Telephone Encounter (Signed)
Britta Mccreedy from [facility] called and states they do not have a prescription fo this pt and her appt is Monday with them. She would like to know if you could fax it over please tp [phone]

## 2020-05-26 NOTE — Telephone Encounter (Signed)
Order written for left AKA prosthetic and supplies. Faxed this morning to [employer].

## 2020-05-28 DIAGNOSIS — T8131XD Disruption of external operation (surgical) wound, not elsewhere classified, subsequent encounter: Secondary | ICD-10-CM | POA: Diagnosis not present

## 2020-05-28 DIAGNOSIS — I110 Hypertensive heart disease with heart failure: Secondary | ICD-10-CM | POA: Diagnosis not present

## 2020-05-28 DIAGNOSIS — F17210 Nicotine dependence, cigarettes, uncomplicated: Secondary | ICD-10-CM | POA: Diagnosis not present

## 2020-05-28 DIAGNOSIS — T8454XD Infection and inflammatory reaction due to internal left knee prosthesis, subsequent encounter: Secondary | ICD-10-CM | POA: Diagnosis not present

## 2020-05-28 DIAGNOSIS — I509 Heart failure, unspecified: Secondary | ICD-10-CM | POA: Diagnosis not present

## 2020-05-28 DIAGNOSIS — M329 Systemic lupus erythematosus, unspecified: Secondary | ICD-10-CM | POA: Diagnosis not present

## 2020-05-30 DIAGNOSIS — I739 Peripheral vascular disease, unspecified: Secondary | ICD-10-CM | POA: Diagnosis not present

## 2020-05-30 DIAGNOSIS — I7300 Raynaud's syndrome without gangrene: Secondary | ICD-10-CM | POA: Diagnosis not present

## 2020-05-30 DIAGNOSIS — I509 Heart failure, unspecified: Secondary | ICD-10-CM | POA: Diagnosis not present

## 2020-05-30 DIAGNOSIS — F17210 Nicotine dependence, cigarettes, uncomplicated: Secondary | ICD-10-CM | POA: Diagnosis not present

## 2020-05-30 DIAGNOSIS — M329 Systemic lupus erythematosus, unspecified: Secondary | ICD-10-CM | POA: Diagnosis not present

## 2020-05-30 DIAGNOSIS — Z89029 Acquired absence of unspecified finger(s): Secondary | ICD-10-CM | POA: Diagnosis not present

## 2020-05-30 DIAGNOSIS — I110 Hypertensive heart disease with heart failure: Secondary | ICD-10-CM | POA: Diagnosis not present

## 2020-05-30 DIAGNOSIS — T8454XD Infection and inflammatory reaction due to internal left knee prosthesis, subsequent encounter: Secondary | ICD-10-CM | POA: Diagnosis not present

## 2020-05-30 DIAGNOSIS — T8131XD Disruption of external operation (surgical) wound, not elsewhere classified, subsequent encounter: Secondary | ICD-10-CM | POA: Diagnosis not present

## 2020-06-03 ENCOUNTER — Telehealth: Payer: Self-pay | Admitting: Orthopedic Surgery

## 2020-06-03 DIAGNOSIS — M329 Systemic lupus erythematosus, unspecified: Secondary | ICD-10-CM | POA: Diagnosis not present

## 2020-06-03 DIAGNOSIS — I110 Hypertensive heart disease with heart failure: Secondary | ICD-10-CM | POA: Diagnosis not present

## 2020-06-03 DIAGNOSIS — F17210 Nicotine dependence, cigarettes, uncomplicated: Secondary | ICD-10-CM | POA: Diagnosis not present

## 2020-06-03 DIAGNOSIS — T8131XD Disruption of external operation (surgical) wound, not elsewhere classified, subsequent encounter: Secondary | ICD-10-CM | POA: Diagnosis not present

## 2020-06-03 DIAGNOSIS — T8454XD Infection and inflammatory reaction due to internal left knee prosthesis, subsequent encounter: Secondary | ICD-10-CM | POA: Diagnosis not present

## 2020-06-03 DIAGNOSIS — I509 Heart failure, unspecified: Secondary | ICD-10-CM | POA: Diagnosis not present

## 2020-06-03 NOTE — Telephone Encounter (Signed)
Received call from Joni Reining -nurse with [facility] advised she saw patient yesterday. She advised patient has a knot on thigh above left stump. Joni Reining said patient  Is experiencing increased pain. The number to contact Joni Reining is [phone]

## 2020-06-03 NOTE — Telephone Encounter (Signed)
Can you please call pt and make an appt for tomorrow with Denny Peon or [place]

## 2020-06-04 ENCOUNTER — Ambulatory Visit (INDEPENDENT_AMBULATORY_CARE_PROVIDER_SITE_OTHER): Payer: Medicare Other | Admitting: Family

## 2020-06-04 ENCOUNTER — Ambulatory Visit (INDEPENDENT_AMBULATORY_CARE_PROVIDER_SITE_OTHER): Payer: Medicare Other

## 2020-06-04 ENCOUNTER — Encounter: Payer: Self-pay | Admitting: Family

## 2020-06-04 DIAGNOSIS — W108XXA Fall (on) (from) other stairs and steps, initial encounter: Secondary | ICD-10-CM | POA: Diagnosis not present

## 2020-06-04 DIAGNOSIS — Z89612 Acquired absence of left leg above knee: Secondary | ICD-10-CM

## 2020-06-04 DIAGNOSIS — S7012XA Contusion of left thigh, initial encounter: Secondary | ICD-10-CM

## 2020-06-04 MED ORDER — SULFAMETHOXAZOLE-TRIMETHOPRIM 800-160 MG PO TABS: 1.0000 | ORAL_TABLET | Freq: Two times a day (BID) | ORAL | 0 refills | Status: DC

## 2020-06-04 NOTE — Progress Notes (Signed)
Post-Op Visit Note   Patient: Crystal Patterson           Date of Birth: 12/16/1965           MRN: [id number] Visit Date: 06/04/2020 PCP: Avon Gully, MD  Chief Complaint: No chief complaint on file.   HPI:  HPI The patient is a 55 year old woman who presents today complaining of left thigh pain and swelling after her nephew was carrying her down some stairs in her wheelchair he fell and she states that she did bang up her thigh.  Unable to move her thigh per her report.  No hip pain no back pain denies any numbness or tingling  She is status post left above-knee amputation February of this year. this is well-healed.  Left Hip Exam  Left hip exam is normal.     On examination of the left thigh there is diffuse tenderness to the anterior thigh with edema and dimpling.  There is no erythema no warmth the anterior thigh is slightly cooler to touch than her posterior thigh. No wound or open area. Incision is well healed.  Visit Diagnoses:  1. S/P AKA (above knee amputation) unilateral, left (HCC)   2. Fall (on) (from) other stairs and steps, initial encounter     Plan: radiographs reassuring. Will place on Bactrim. Follow up in 1 week for clinical reevaluation.  Follow-Up Instructions: No follow-ups on file.   Imaging: No results found.  Orders:  Orders Placed This Encounter  Procedures  . XR FEMUR MIN 2 VIEWS LEFT   No orders of the defined types were placed in this encounter.    PMFS History: Patient Active Problem List   Diagnosis Date Noted  . Hypoalbuminemia 04/15/2020  . At risk for adverse drug event 04/15/2020  . Knee osteomyelitis, left (HCC) 04/04/2020  . Failed total knee arthroplasty, sequela   . Open knee wound, left, sequela   . Infection of total knee replacement (HCC) 01/25/2020  . 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 Thrombosis of left ulnar artery (HCC) 01/23/2020  . Status post total left knee replacement  01/06/2020  . Unilateral primary osteoarthritis, left knee 09/26/2019  . Unilateral primary osteoarthritis, right knee 09/26/2019  . Systemic lupus erythematosus arthritis (HCC) 09/12/2019  . Arthritis of both knees 09/12/2019  . CAD (coronary artery disease) 07/14/2017  . Tobacco use disorder 12/30/2016  . Metabolic bone disease 12/30/2016  . Marijuana use 12/30/2016  . Syncope 12/06/2016  . ETOH abuse 12/06/2016  . PAD (peripheral artery disease) (HCC) 12/06/2016  . LVH (left ventricular hypertrophy) 05/26/2016  . Elevated troponin 05/24/2016  . History of lupus nephritis 03/23/2011  . Raynaud's phenomenon 03/23/2011  . HYPOKALEMIA 08/19/2008  . FATTY LIVER DISEASE 05/09/2008  . WEIGHT LOSS 03/28/2008  . RAYNAUDS SYNDROME 02/15/2008  . CARDIAC MURMUR 11/16/2007  . Systemic lupus erythematosus (HCC) 05/25/2006  . GOUT 02/02/2006  . DEPRESSION 02/02/2006  . MIGRAINE HEADACHE 02/02/2006  . Essential hypertension 02/02/2006  . ALLERGIC RHINITIS 02/02/2006  . Asthma 02/02/2006  . GERD 02/02/2006  . IBS 02/02/2006  . OVERACTIVE BLADDER 02/02/2006   Past Medical History:  Diagnosis Date  . Anginal pain (HCC) 2020  . Arthritis    Knees, hips,  . CHF (congestive heart failure) (HCC)   . Elevated troponin 05/24/2016  . Finger amputation, no complication   . GERD (gastroesophageal reflux disease)   . Gout   . Heart murmur    never has caused  any problems per patient 04/02/20  . Hypercholesteremia   . Hypertension   . 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 Lupus (HCC)     Family History  Problem Relation Age of Onset  . Pancreatic cancer Mother   . Colon cancer Father     Past Surgical History:  Procedure Laterality Date  . AMPUTATION Left 04/04/2020   Procedure: LEFT ABOVE KNEE AMPUTATION;  Surgeon: Nadara Mustard, MD;  Location: [facility] OR;  Service: Orthopedics;  Laterality: Left;  . CARDIAC CATHETERIZATION  05/25/2016  . COLONOSCOPY N/A 01/17/2019    Procedure: COLONOSCOPY;  Surgeon: Corbin Ade, MD;  [facility]: [facility];  Service: Endoscopy;  Laterality: N/A;  10:30  . EXCISIONAL TOTAL KNEE ARTHROPLASTY Left 02/05/2020   Procedure: REPEAT IRRIGATION AND DEBRIDEMENT LEFT KNEE;  Surgeon: [name], MD;  Location: [facility];  Service: Orthopedics;  Laterality: Left;  . I & D EXTREMITY Left 02/08/2020   Procedure: EXCISIONAL DEBRIDEMENT LEFT KNEE;  Surgeon: Nadara Mustard, MD;  Location: [facility] OR;  Service: Orthopedics;  Laterality: Left;  . I & D KNEE WITH POLY EXCHANGE Left 01/25/2020   Procedure: INCISION AND DRAINAGE LEFT KNEE WITH POLY-LINER EXCHANGE;  Surgeon: [name], MD;  Location: [facility];  Service: Orthopedics;  Laterality: Left;  . IRRIGATION AND DEBRIDEMENT KNEE Left 01/30/2020   Procedure: REPEAT IRRIGATION AND DEBRIDEMENT LEFT KNEE PLACEMENT OF WOUND VAC;  Surgeon: [name], MD;  Location: [facility];  Service: Orthopedics;  Laterality: Left;  . left finger amputations    . POLYPECTOMY  01/17/2019   Procedure: POLYPECTOMY;  Surgeon: Corbin Ade, MD;  [facility]: [facility];  Service: Endoscopy;;  . right hand surgery    . right knee arthroscopy    . RIGHT/LEFT HEART CATH AND CORONARY ANGIOGRAPHY N/A 05/25/2016   Procedure: Right/Left Heart Cath and Coronary Angiography;  Surgeon: Lennette Bihari, MD;  Location: [facility];  Service: Cardiovascular;  Laterality: N/A;  . SKIN SPLIT GRAFT Left 02/08/2020   Procedure: SKIN GRAFT SPLIT THICKNESS LEFT KNEE;  Surgeon: Nadara Mustard, MD;  Location: [facility] OR;  Service: Orthopedics;  Laterality: Left;  . TOTAL KNEE ARTHROPLASTY Left 01/04/2020   Procedure: LEFT TOTAL KNEE ARTHROPLASTY;  Surgeon: [name], MD;  Location: [facility];  Service: Orthopedics;  Laterality: Left;   Social History   Occupational History  . Occupation: unemployed  Tobacco Use  . Smoking status: Current Every Day Smoker    Packs/day: 0.10    Years:  15.00    Pack years: 1.50    Types: Cigarettes  . Smokeless tobacco: Never Used  Vaping Use  . Vaping Use: Never used  Substance and Sexual Activity  . Alcohol use: Yes    Alcohol/week: 2.0 - 3.0 standard drinks    Types: 2 - 3 Cans of beer per week    Comment: 03/18/20   "somedays none"  . Drug use: Yes    Types: Marijuana    Comment: Last time 04/02/20  . Sexual activity: Not Currently    Birth control/protection: Post-menopausal

## 2020-06-05 ENCOUNTER — Other Ambulatory Visit: Payer: Self-pay | Admitting: Adult Health

## 2020-06-05 DIAGNOSIS — I10 Essential (primary) hypertension: Secondary | ICD-10-CM

## 2020-06-06 DIAGNOSIS — I509 Heart failure, unspecified: Secondary | ICD-10-CM | POA: Diagnosis not present

## 2020-06-06 DIAGNOSIS — T8131XD Disruption of external operation (surgical) wound, not elsewhere classified, subsequent encounter: Secondary | ICD-10-CM | POA: Diagnosis not present

## 2020-06-06 DIAGNOSIS — Z89612 Acquired absence of left leg above knee: Secondary | ICD-10-CM | POA: Diagnosis not present

## 2020-06-06 DIAGNOSIS — F17210 Nicotine dependence, cigarettes, uncomplicated: Secondary | ICD-10-CM | POA: Diagnosis not present

## 2020-06-06 DIAGNOSIS — M329 Systemic lupus erythematosus, unspecified: Secondary | ICD-10-CM | POA: Diagnosis not present

## 2020-06-06 DIAGNOSIS — Z4781 Encounter for orthopedic aftercare following surgical amputation: Secondary | ICD-10-CM | POA: Diagnosis not present

## 2020-06-06 DIAGNOSIS — M6281 Muscle weakness (generalized): Secondary | ICD-10-CM | POA: Diagnosis not present

## 2020-06-06 DIAGNOSIS — T8454XD Infection and inflammatory reaction due to internal left knee prosthesis, subsequent encounter: Secondary | ICD-10-CM | POA: Diagnosis not present

## 2020-06-06 DIAGNOSIS — I110 Hypertensive heart disease with heart failure: Secondary | ICD-10-CM | POA: Diagnosis not present

## 2020-06-09 ENCOUNTER — Ambulatory Visit: Payer: Medicaid Other | Admitting: Physician Assistant

## 2020-06-11 DIAGNOSIS — I7300 Raynaud's syndrome without gangrene: Secondary | ICD-10-CM | POA: Diagnosis not present

## 2020-06-11 DIAGNOSIS — I10 Essential (primary) hypertension: Secondary | ICD-10-CM | POA: Diagnosis not present

## 2020-06-11 DIAGNOSIS — M329 Systemic lupus erythematosus, unspecified: Secondary | ICD-10-CM | POA: Diagnosis not present

## 2020-06-11 DIAGNOSIS — Z1389 Encounter for screening for other disorder: Secondary | ICD-10-CM | POA: Diagnosis not present

## 2020-06-11 DIAGNOSIS — Z0001 Encounter for general adult medical examination with abnormal findings: Secondary | ICD-10-CM | POA: Diagnosis not present

## 2020-06-12 ENCOUNTER — Ambulatory Visit (INDEPENDENT_AMBULATORY_CARE_PROVIDER_SITE_OTHER): Payer: Medicare Other | Admitting: Orthopedic Surgery

## 2020-06-12 ENCOUNTER — Encounter: Payer: Self-pay | Admitting: Orthopedic Surgery

## 2020-06-12 DIAGNOSIS — I7300 Raynaud's syndrome without gangrene: Secondary | ICD-10-CM | POA: Diagnosis not present

## 2020-06-12 DIAGNOSIS — I739 Peripheral vascular disease, unspecified: Secondary | ICD-10-CM | POA: Diagnosis not present

## 2020-06-12 DIAGNOSIS — M329 Systemic lupus erythematosus, unspecified: Secondary | ICD-10-CM | POA: Diagnosis not present

## 2020-06-12 DIAGNOSIS — Z96659 Presence of unspecified artificial knee joint: Secondary | ICD-10-CM | POA: Diagnosis not present

## 2020-06-12 DIAGNOSIS — Z89029 Acquired absence of unspecified finger(s): Secondary | ICD-10-CM | POA: Diagnosis not present

## 2020-06-12 DIAGNOSIS — Z89612 Acquired absence of left leg above knee: Secondary | ICD-10-CM

## 2020-06-12 NOTE — Progress Notes (Addendum)
Office Visit Note   Patient: Crystal Patterson           Date of Birth: 04/22/1965           MRN: [id number] Visit Date: 06/12/2020              Requested by: Avon Gully, MD [address] [place],  [place] [address] PCP: Avon Gully, MD  Chief Complaint  Patient presents with  . Left Leg - Routine Post Op    04/04/20 left AKA       HPI: Patient is a 55 year old woman who is 2 months status post left above-the-knee amputation.  She was placed on antibiotic after her last office visit.  She states she has completed her antibiotic she states she has a little bit of pain she is wearing a stump shrinker and is following up with [facility].  Assessment & Plan: Visit Diagnoses:  1. S/P AKA (above knee amputation) unilateral, left (HCC)     Plan: Patient will follow-up with [facility] for her test socket fitting we will reevaluate in the office in 2 months.  She will follow up with [facility] for gait training.  Follow-Up Instructions: Return in about 2 months (around 08/12/2020).   Ortho Exam  Patient is alert, oriented, no adenopathy, well-dressed, normal affect, normal respiratory effort. Examination patient's residual limb is well-healed there is no open wounds no drainage no cellulitis no tenderness to palpation no signs of infection.  The residual limb is consolidating well.  Patient is a new left transfemoral amputee.  Patient's current comorbidities are not expected to impact the ability to function with the prescribed prosthesis. Patient verbally communicates a strong desire to use a prosthesis. Patient currently requires mobility aids to ambulate without a prosthesis.  Expects not to use mobility aids with a new prosthesis.  Patient is a K3 level ambulator that spends a lot of time walking around on uneven terrain over obstacles, up and down stairs, and ambulates with a variable cadence.  The patient will benefit from an MPK knee because they frequently encounter uneven  terrain, stairs, and sometimes have to walk backwards. The "stumble recovery" and "intuitive stance" features will reduce fall risk and allow the patient to walk down stairs "step over step."   Imaging: No results found. No images are attached to the encounter.  Labs: Lab Results  Component Value Date   HGBA1C 4.6 (L) 05/25/2016   ESRSEDRATE 42 (H) 02/04/2020   ESRSEDRATE 70 (H) 01/26/2020   ESRSEDRATE 6 07/09/2008   CRP 8.4 (H) 02/04/2020   CRP 14.8 (H) 01/26/2020   CRP 0.3 07/09/2008   REPTSTATUS 02/13/2020 FINAL 02/08/2020   GRAMSTAIN NO WBC SEEN NO ORGANISMS SEEN  02/08/2020   CULT  02/08/2020    RARE PROTEUS MIRABILIS DR. BLACKMAN NOTIFIED VIA EPIC MESSAGE REGARDING CULTURE GROWTH NO ANAEROBES ISOLATED Performed at [facility], [address], [place], [place] [address]    LABORGA PROTEUS MIRABILIS 02/08/2020     Lab Results  Component Value Date   ALBUMIN 2.9 (L) 04/04/2020   ALBUMIN 2.5 (L) 02/04/2020   ALBUMIN 3.4 (L) 12/06/2016    Lab Results  Component Value Date   MG 1.5 (L) 12/06/2016   MG 1.4 (L) 05/25/2016   No results found for: VD25OH  No results found for: PREALBUMIN CBC EXTENDED Latest Ref Rng & Units 04/06/2020 04/05/2020 04/05/2020  WBC 4.0 - 10.5 K/uL 7.6 - 7.3  RBC 3.87 - 5.11 MIL/uL 2.93(L) - 2.53(L)  HGB 12.0 - 15.0 g/dL 9.8(X) 2.1(J) 6.9(LL)  HCT 36.0 - 46.0 % 26.1(L) 27.5(L) 22.9(L)  PLT 150 - 400 K/uL 164 - 180  NEUTROABS 1.7 - 7.7 K/uL - - -  LYMPHSABS 0.7 - 4.0 K/uL - - -     There is no height or weight on file to calculate BMI.  Orders:  No orders of the defined types were placed in this encounter.  No orders of the defined types were placed in this encounter.    Procedures: No procedures performed  Clinical Data: No additional findings.  ROS:  All other systems negative, except as noted in the HPI. Review of Systems  Objective: Vital Signs: LMP  (LMP Unknown)   Specialty Comments:  No specialty comments  available.  PMFS History: Patient Active Problem List   Diagnosis Date Noted  . Hypoalbuminemia 04/15/2020  . At risk for adverse drug event 04/15/2020  . Knee osteomyelitis, left (HCC) 04/04/2020  . Failed total knee arthroplasty, sequela   . Open knee wound, left, sequela   . Infection of total knee replacement (HCC) 01/25/2020  . 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 Thrombosis of left ulnar artery (HCC) 01/23/2020  . Status post total left knee replacement 01/06/2020  . Unilateral primary osteoarthritis, left knee 09/26/2019  . Unilateral primary osteoarthritis, right knee 09/26/2019  . Systemic lupus erythematosus arthritis (HCC) 09/12/2019  . Arthritis of both knees 09/12/2019  . CAD (coronary artery disease) 07/14/2017  . Tobacco use disorder 12/30/2016  . Metabolic bone disease 12/30/2016  . Marijuana use 12/30/2016  . Syncope 12/06/2016  . ETOH abuse 12/06/2016  . PAD (peripheral artery disease) (HCC) 12/06/2016  . LVH (left ventricular hypertrophy) 05/26/2016  . Elevated troponin 05/24/2016  . History of lupus nephritis 03/23/2011  . Raynaud's phenomenon 03/23/2011  . HYPOKALEMIA 08/19/2008  . FATTY LIVER DISEASE 05/09/2008  . WEIGHT LOSS 03/28/2008  . RAYNAUDS SYNDROME 02/15/2008  . CARDIAC MURMUR 11/16/2007  . Systemic lupus erythematosus (HCC) 05/25/2006  . GOUT 02/02/2006  . DEPRESSION 02/02/2006  . MIGRAINE HEADACHE 02/02/2006  . Essential hypertension 02/02/2006  . ALLERGIC RHINITIS 02/02/2006  . Asthma 02/02/2006  . GERD 02/02/2006  . IBS 02/02/2006  . OVERACTIVE BLADDER 02/02/2006   Past Medical History:  Diagnosis Date  . Anginal pain (HCC) 2020  . Arthritis    Knees, hips,  . CHF (congestive heart failure) (HCC)   . Elevated troponin 05/24/2016  . Finger amputation, no complication   . GERD (gastroesophageal reflux disease)   . Gout   . Heart murmur    never has caused any problems per patient 04/02/20  .  Hypercholesteremia   . Hypertension   . 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 Lupus (HCC)     Family History  Problem Relation Age of Onset  . Pancreatic cancer Mother   . Colon cancer Father     Past Surgical History:  Procedure Laterality Date  . AMPUTATION Left 04/04/2020   Procedure: LEFT ABOVE KNEE AMPUTATION;  Surgeon: Nadara Mustard, MD;  Location: [facility] OR;  Service: Orthopedics;  Laterality: Left;  . CARDIAC CATHETERIZATION  05/25/2016  . COLONOSCOPY N/A 01/17/2019   Procedure: COLONOSCOPY;  Surgeon: Corbin Ade, MD;  [facility];  Service: Endoscopy;  Laterality: N/A;  10:30  . EXCISIONAL TOTAL KNEE ARTHROPLASTY Left 02/05/2020   Procedure: REPEAT IRRIGATION AND DEBRIDEMENT LEFT KNEE;  Surgeon: [name], MD;  Location: [facility];  Service: Orthopedics;  Laterality: Left;  . I & D EXTREMITY Left 02/08/2020   Procedure: EXCISIONAL DEBRIDEMENT LEFT KNEE;  Surgeon: Nadara Mustard, MD;  Location: [facility] OR;  Service: Orthopedics;  Laterality: Left;  . I & D KNEE WITH POLY EXCHANGE Left 01/25/2020   Procedure: INCISION AND DRAINAGE LEFT KNEE WITH POLY-LINER EXCHANGE;  Surgeon: [name], MD;  Location: [facility] ORS;  Service: Orthopedics;  Laterality: Left;  . IRRIGATION AND DEBRIDEMENT KNEE Left 01/30/2020   Procedure: REPEAT IRRIGATION AND DEBRIDEMENT LEFT KNEE PLACEMENT OF WOUND VAC;  Surgeon: [name], MD;  Location: [facility] ORS;  Service: Orthopedics;  Laterality: Left;  . left finger amputations    . POLYPECTOMY  01/17/2019   Procedure: POLYPECTOMY;  Surgeon: Corbin Ade, MD;  [facility];  Service: Endoscopy;;  . right hand surgery    . right knee arthroscopy    . RIGHT/LEFT HEART CATH AND CORONARY ANGIOGRAPHY N/A 05/25/2016   Procedure: Right/Left Heart Cath and Coronary Angiography;  Surgeon: Lennette Bihari, MD;  Location: [facility];  Service: Cardiovascular;  Laterality: N/A;  . SKIN SPLIT  GRAFT Left 02/08/2020   Procedure: SKIN GRAFT SPLIT THICKNESS LEFT KNEE;  Surgeon: Nadara Mustard, MD;  Location: [facility] OR;  Service: Orthopedics;  Laterality: Left;  . TOTAL KNEE ARTHROPLASTY Left 01/04/2020   Procedure: LEFT TOTAL KNEE ARTHROPLASTY;  Surgeon: [name], MD;  Location: [facility];  Service: Orthopedics;  Laterality: Left;   Social History   Occupational History  . Occupation: unemployed  Tobacco Use  . Smoking status: Current Every Day Smoker    Packs/day: 0.10    Years: 15.00    Pack years: 1.50    Types: Cigarettes  . Smokeless tobacco: Never Used  Vaping Use  . Vaping Use: Never used  Substance and Sexual Activity  . Alcohol use: Yes    Alcohol/week: 2.0 - 3.0 standard drinks    Types: 2 - 3 Cans of beer per week    Comment: 03/18/20   "somedays none"  . Drug use: Yes    Types: Marijuana    Comment: Last time 04/02/20  . Sexual activity: Not Currently    Birth control/protection: Post-menopausal

## 2020-06-13 DIAGNOSIS — T8454XD Infection and inflammatory reaction due to internal left knee prosthesis, subsequent encounter: Secondary | ICD-10-CM | POA: Diagnosis not present

## 2020-06-13 DIAGNOSIS — I509 Heart failure, unspecified: Secondary | ICD-10-CM | POA: Diagnosis not present

## 2020-06-13 DIAGNOSIS — T8131XD Disruption of external operation (surgical) wound, not elsewhere classified, subsequent encounter: Secondary | ICD-10-CM | POA: Diagnosis not present

## 2020-06-13 DIAGNOSIS — F17210 Nicotine dependence, cigarettes, uncomplicated: Secondary | ICD-10-CM | POA: Diagnosis not present

## 2020-06-13 DIAGNOSIS — M329 Systemic lupus erythematosus, unspecified: Secondary | ICD-10-CM | POA: Diagnosis not present

## 2020-06-13 DIAGNOSIS — I110 Hypertensive heart disease with heart failure: Secondary | ICD-10-CM | POA: Diagnosis not present

## 2020-06-17 DIAGNOSIS — I110 Hypertensive heart disease with heart failure: Secondary | ICD-10-CM | POA: Diagnosis not present

## 2020-06-17 DIAGNOSIS — I509 Heart failure, unspecified: Secondary | ICD-10-CM | POA: Diagnosis not present

## 2020-06-17 DIAGNOSIS — T8131XD Disruption of external operation (surgical) wound, not elsewhere classified, subsequent encounter: Secondary | ICD-10-CM | POA: Diagnosis not present

## 2020-06-17 DIAGNOSIS — F17210 Nicotine dependence, cigarettes, uncomplicated: Secondary | ICD-10-CM | POA: Diagnosis not present

## 2020-06-17 DIAGNOSIS — T8454XD Infection and inflammatory reaction due to internal left knee prosthesis, subsequent encounter: Secondary | ICD-10-CM | POA: Diagnosis not present

## 2020-06-17 DIAGNOSIS — M329 Systemic lupus erythematosus, unspecified: Secondary | ICD-10-CM | POA: Diagnosis not present

## 2020-06-23 DIAGNOSIS — Z0001 Encounter for general adult medical examination with abnormal findings: Secondary | ICD-10-CM | POA: Diagnosis not present

## 2020-06-28 ENCOUNTER — Other Ambulatory Visit: Payer: Self-pay | Admitting: Adult Health

## 2020-06-28 DIAGNOSIS — E785 Hyperlipidemia, unspecified: Secondary | ICD-10-CM

## 2020-07-06 DIAGNOSIS — Z4781 Encounter for orthopedic aftercare following surgical amputation: Secondary | ICD-10-CM | POA: Diagnosis not present

## 2020-07-06 DIAGNOSIS — M6281 Muscle weakness (generalized): Secondary | ICD-10-CM | POA: Diagnosis not present

## 2020-07-06 DIAGNOSIS — Z89612 Acquired absence of left leg above knee: Secondary | ICD-10-CM | POA: Diagnosis not present

## 2020-07-11 ENCOUNTER — Other Ambulatory Visit: Payer: Self-pay | Admitting: Adult Health

## 2020-07-11 DIAGNOSIS — I10 Essential (primary) hypertension: Secondary | ICD-10-CM | POA: Diagnosis not present

## 2020-07-11 DIAGNOSIS — I739 Peripheral vascular disease, unspecified: Secondary | ICD-10-CM | POA: Diagnosis not present

## 2020-07-11 DIAGNOSIS — E785 Hyperlipidemia, unspecified: Secondary | ICD-10-CM

## 2020-07-12 DIAGNOSIS — M329 Systemic lupus erythematosus, unspecified: Secondary | ICD-10-CM | POA: Diagnosis not present

## 2020-07-12 DIAGNOSIS — Z96659 Presence of unspecified artificial knee joint: Secondary | ICD-10-CM | POA: Diagnosis not present

## 2020-07-12 DIAGNOSIS — I7300 Raynaud's syndrome without gangrene: Secondary | ICD-10-CM | POA: Diagnosis not present

## 2020-07-12 DIAGNOSIS — Z89029 Acquired absence of unspecified finger(s): Secondary | ICD-10-CM | POA: Diagnosis not present

## 2020-07-12 DIAGNOSIS — I739 Peripheral vascular disease, unspecified: Secondary | ICD-10-CM | POA: Diagnosis not present

## 2020-07-21 DIAGNOSIS — I739 Peripheral vascular disease, unspecified: Secondary | ICD-10-CM | POA: Diagnosis not present

## 2020-07-21 DIAGNOSIS — I7300 Raynaud's syndrome without gangrene: Secondary | ICD-10-CM | POA: Diagnosis not present

## 2020-07-21 DIAGNOSIS — M329 Systemic lupus erythematosus, unspecified: Secondary | ICD-10-CM | POA: Diagnosis not present

## 2020-07-21 DIAGNOSIS — Z89029 Acquired absence of unspecified finger(s): Secondary | ICD-10-CM | POA: Diagnosis not present

## 2020-08-11 DIAGNOSIS — I10 Essential (primary) hypertension: Secondary | ICD-10-CM | POA: Diagnosis not present

## 2020-08-11 DIAGNOSIS — Z89612 Acquired absence of left leg above knee: Secondary | ICD-10-CM | POA: Diagnosis not present

## 2020-08-12 DIAGNOSIS — M329 Systemic lupus erythematosus, unspecified: Secondary | ICD-10-CM | POA: Diagnosis not present

## 2020-08-12 DIAGNOSIS — Z96659 Presence of unspecified artificial knee joint: Secondary | ICD-10-CM | POA: Diagnosis not present

## 2020-08-12 DIAGNOSIS — Z89029 Acquired absence of unspecified finger(s): Secondary | ICD-10-CM | POA: Diagnosis not present

## 2020-08-12 DIAGNOSIS — I739 Peripheral vascular disease, unspecified: Secondary | ICD-10-CM | POA: Diagnosis not present

## 2020-08-12 DIAGNOSIS — I7300 Raynaud's syndrome without gangrene: Secondary | ICD-10-CM | POA: Diagnosis not present

## 2020-08-21 DIAGNOSIS — M329 Systemic lupus erythematosus, unspecified: Secondary | ICD-10-CM | POA: Diagnosis not present

## 2020-08-21 DIAGNOSIS — Z89029 Acquired absence of unspecified finger(s): Secondary | ICD-10-CM | POA: Diagnosis not present

## 2020-08-21 DIAGNOSIS — I7300 Raynaud's syndrome without gangrene: Secondary | ICD-10-CM | POA: Diagnosis not present

## 2020-08-21 DIAGNOSIS — I739 Peripheral vascular disease, unspecified: Secondary | ICD-10-CM | POA: Diagnosis not present

## 2020-08-25 DIAGNOSIS — Z89612 Acquired absence of left leg above knee: Secondary | ICD-10-CM | POA: Diagnosis not present

## 2020-10-06 DIAGNOSIS — M6281 Muscle weakness (generalized): Secondary | ICD-10-CM | POA: Diagnosis not present

## 2020-10-06 DIAGNOSIS — Z89612 Acquired absence of left leg above knee: Secondary | ICD-10-CM | POA: Diagnosis not present

## 2020-10-06 DIAGNOSIS — Z4781 Encounter for orthopedic aftercare following surgical amputation: Secondary | ICD-10-CM | POA: Diagnosis not present

## 2020-10-11 DIAGNOSIS — I10 Essential (primary) hypertension: Secondary | ICD-10-CM | POA: Diagnosis not present

## 2020-10-11 DIAGNOSIS — I739 Peripheral vascular disease, unspecified: Secondary | ICD-10-CM | POA: Diagnosis not present

## 2020-10-12 DIAGNOSIS — M329 Systemic lupus erythematosus, unspecified: Secondary | ICD-10-CM | POA: Diagnosis not present

## 2020-10-12 DIAGNOSIS — I7300 Raynaud's syndrome without gangrene: Secondary | ICD-10-CM | POA: Diagnosis not present

## 2020-10-12 DIAGNOSIS — Z96659 Presence of unspecified artificial knee joint: Secondary | ICD-10-CM | POA: Diagnosis not present

## 2020-10-12 DIAGNOSIS — Z89029 Acquired absence of unspecified finger(s): Secondary | ICD-10-CM | POA: Diagnosis not present

## 2020-10-12 DIAGNOSIS — I739 Peripheral vascular disease, unspecified: Secondary | ICD-10-CM | POA: Diagnosis not present

## 2020-10-21 DIAGNOSIS — Z89029 Acquired absence of unspecified finger(s): Secondary | ICD-10-CM | POA: Diagnosis not present

## 2020-10-21 DIAGNOSIS — I7300 Raynaud's syndrome without gangrene: Secondary | ICD-10-CM | POA: Diagnosis not present

## 2020-10-21 DIAGNOSIS — I739 Peripheral vascular disease, unspecified: Secondary | ICD-10-CM | POA: Diagnosis not present

## 2020-10-21 DIAGNOSIS — M329 Systemic lupus erythematosus, unspecified: Secondary | ICD-10-CM | POA: Diagnosis not present

## 2020-10-29 DIAGNOSIS — Z789 Other specified health status: Secondary | ICD-10-CM | POA: Diagnosis not present

## 2020-10-29 DIAGNOSIS — R262 Difficulty in walking, not elsewhere classified: Secondary | ICD-10-CM | POA: Diagnosis not present

## 2020-10-29 DIAGNOSIS — Z89612 Acquired absence of left leg above knee: Secondary | ICD-10-CM | POA: Diagnosis not present

## 2020-10-29 DIAGNOSIS — M2560 Stiffness of unspecified joint, not elsewhere classified: Secondary | ICD-10-CM | POA: Diagnosis not present

## 2020-10-29 DIAGNOSIS — Z89611 Acquired absence of right leg above knee: Secondary | ICD-10-CM | POA: Diagnosis not present

## 2020-10-29 DIAGNOSIS — M79659 Pain in unspecified thigh: Secondary | ICD-10-CM | POA: Diagnosis not present

## 2020-10-29 DIAGNOSIS — Z7409 Other reduced mobility: Secondary | ICD-10-CM | POA: Diagnosis not present

## 2020-10-29 DIAGNOSIS — R29898 Other symptoms and signs involving the musculoskeletal system: Secondary | ICD-10-CM | POA: Diagnosis not present

## 2020-10-31 DIAGNOSIS — Z789 Other specified health status: Secondary | ICD-10-CM | POA: Diagnosis not present

## 2020-10-31 DIAGNOSIS — Z7409 Other reduced mobility: Secondary | ICD-10-CM | POA: Diagnosis not present

## 2020-10-31 DIAGNOSIS — Z89611 Acquired absence of right leg above knee: Secondary | ICD-10-CM | POA: Diagnosis not present

## 2020-10-31 DIAGNOSIS — R29898 Other symptoms and signs involving the musculoskeletal system: Secondary | ICD-10-CM | POA: Diagnosis not present

## 2020-10-31 DIAGNOSIS — M79659 Pain in unspecified thigh: Secondary | ICD-10-CM | POA: Diagnosis not present

## 2020-10-31 DIAGNOSIS — Z89612 Acquired absence of left leg above knee: Secondary | ICD-10-CM | POA: Diagnosis not present

## 2020-10-31 DIAGNOSIS — R262 Difficulty in walking, not elsewhere classified: Secondary | ICD-10-CM | POA: Diagnosis not present

## 2020-10-31 DIAGNOSIS — M2560 Stiffness of unspecified joint, not elsewhere classified: Secondary | ICD-10-CM | POA: Diagnosis not present

## 2020-11-06 DIAGNOSIS — Z4781 Encounter for orthopedic aftercare following surgical amputation: Secondary | ICD-10-CM | POA: Diagnosis not present

## 2020-11-06 DIAGNOSIS — Z89612 Acquired absence of left leg above knee: Secondary | ICD-10-CM | POA: Diagnosis not present

## 2020-11-06 DIAGNOSIS — M6281 Muscle weakness (generalized): Secondary | ICD-10-CM | POA: Diagnosis not present

## 2020-11-11 DIAGNOSIS — Z89611 Acquired absence of right leg above knee: Secondary | ICD-10-CM | POA: Diagnosis not present

## 2020-11-11 DIAGNOSIS — R2681 Unsteadiness on feet: Secondary | ICD-10-CM | POA: Diagnosis not present

## 2020-11-11 DIAGNOSIS — M329 Systemic lupus erythematosus, unspecified: Secondary | ICD-10-CM | POA: Diagnosis not present

## 2020-11-11 DIAGNOSIS — R29898 Other symptoms and signs involving the musculoskeletal system: Secondary | ICD-10-CM | POA: Diagnosis not present

## 2020-11-11 DIAGNOSIS — Z89612 Acquired absence of left leg above knee: Secondary | ICD-10-CM | POA: Diagnosis not present

## 2020-11-11 DIAGNOSIS — I10 Essential (primary) hypertension: Secondary | ICD-10-CM | POA: Diagnosis not present

## 2020-11-12 DIAGNOSIS — I739 Peripheral vascular disease, unspecified: Secondary | ICD-10-CM | POA: Diagnosis not present

## 2020-11-12 DIAGNOSIS — I7300 Raynaud's syndrome without gangrene: Secondary | ICD-10-CM | POA: Diagnosis not present

## 2020-11-12 DIAGNOSIS — Z89029 Acquired absence of unspecified finger(s): Secondary | ICD-10-CM | POA: Diagnosis not present

## 2020-11-12 DIAGNOSIS — M329 Systemic lupus erythematosus, unspecified: Secondary | ICD-10-CM | POA: Diagnosis not present

## 2020-11-12 DIAGNOSIS — Z96659 Presence of unspecified artificial knee joint: Secondary | ICD-10-CM | POA: Diagnosis not present

## 2020-11-13 DIAGNOSIS — Z89611 Acquired absence of right leg above knee: Secondary | ICD-10-CM | POA: Diagnosis not present

## 2020-11-13 DIAGNOSIS — R29898 Other symptoms and signs involving the musculoskeletal system: Secondary | ICD-10-CM | POA: Diagnosis not present

## 2020-11-13 DIAGNOSIS — R2681 Unsteadiness on feet: Secondary | ICD-10-CM | POA: Diagnosis not present

## 2020-11-13 DIAGNOSIS — Z89612 Acquired absence of left leg above knee: Secondary | ICD-10-CM | POA: Diagnosis not present

## 2020-11-18 DIAGNOSIS — R29898 Other symptoms and signs involving the musculoskeletal system: Secondary | ICD-10-CM | POA: Diagnosis not present

## 2020-11-18 DIAGNOSIS — Z89612 Acquired absence of left leg above knee: Secondary | ICD-10-CM | POA: Diagnosis not present

## 2020-11-18 DIAGNOSIS — R2681 Unsteadiness on feet: Secondary | ICD-10-CM | POA: Diagnosis not present

## 2020-11-18 DIAGNOSIS — Z89611 Acquired absence of right leg above knee: Secondary | ICD-10-CM | POA: Diagnosis not present

## 2020-11-20 DIAGNOSIS — Z89611 Acquired absence of right leg above knee: Secondary | ICD-10-CM | POA: Diagnosis not present

## 2020-11-20 DIAGNOSIS — R2681 Unsteadiness on feet: Secondary | ICD-10-CM | POA: Diagnosis not present

## 2020-11-20 DIAGNOSIS — Z89612 Acquired absence of left leg above knee: Secondary | ICD-10-CM | POA: Diagnosis not present

## 2020-11-20 DIAGNOSIS — R29898 Other symptoms and signs involving the musculoskeletal system: Secondary | ICD-10-CM | POA: Diagnosis not present

## 2020-11-21 DIAGNOSIS — M329 Systemic lupus erythematosus, unspecified: Secondary | ICD-10-CM | POA: Diagnosis not present

## 2020-11-21 DIAGNOSIS — I739 Peripheral vascular disease, unspecified: Secondary | ICD-10-CM | POA: Diagnosis not present

## 2020-11-21 DIAGNOSIS — Z89029 Acquired absence of unspecified finger(s): Secondary | ICD-10-CM | POA: Diagnosis not present

## 2020-11-21 DIAGNOSIS — I7300 Raynaud's syndrome without gangrene: Secondary | ICD-10-CM | POA: Diagnosis not present

## 2020-11-25 DIAGNOSIS — Z89611 Acquired absence of right leg above knee: Secondary | ICD-10-CM | POA: Diagnosis not present

## 2020-11-25 DIAGNOSIS — Z89612 Acquired absence of left leg above knee: Secondary | ICD-10-CM | POA: Diagnosis not present

## 2020-11-25 DIAGNOSIS — R29898 Other symptoms and signs involving the musculoskeletal system: Secondary | ICD-10-CM | POA: Diagnosis not present

## 2020-11-25 DIAGNOSIS — R2681 Unsteadiness on feet: Secondary | ICD-10-CM | POA: Diagnosis not present

## 2020-11-27 DIAGNOSIS — Z89611 Acquired absence of right leg above knee: Secondary | ICD-10-CM | POA: Diagnosis not present

## 2020-11-27 DIAGNOSIS — Z89612 Acquired absence of left leg above knee: Secondary | ICD-10-CM | POA: Diagnosis not present

## 2020-11-27 DIAGNOSIS — R29898 Other symptoms and signs involving the musculoskeletal system: Secondary | ICD-10-CM | POA: Diagnosis not present

## 2020-11-27 DIAGNOSIS — R2681 Unsteadiness on feet: Secondary | ICD-10-CM | POA: Diagnosis not present

## 2020-12-02 DIAGNOSIS — Z89611 Acquired absence of right leg above knee: Secondary | ICD-10-CM | POA: Diagnosis not present

## 2020-12-02 DIAGNOSIS — Z89612 Acquired absence of left leg above knee: Secondary | ICD-10-CM | POA: Diagnosis not present

## 2020-12-02 DIAGNOSIS — R29898 Other symptoms and signs involving the musculoskeletal system: Secondary | ICD-10-CM | POA: Diagnosis not present

## 2020-12-02 DIAGNOSIS — R2681 Unsteadiness on feet: Secondary | ICD-10-CM | POA: Diagnosis not present

## 2020-12-04 DIAGNOSIS — R29898 Other symptoms and signs involving the musculoskeletal system: Secondary | ICD-10-CM | POA: Diagnosis not present

## 2020-12-04 DIAGNOSIS — Z89611 Acquired absence of right leg above knee: Secondary | ICD-10-CM | POA: Diagnosis not present

## 2020-12-04 DIAGNOSIS — Z89612 Acquired absence of left leg above knee: Secondary | ICD-10-CM | POA: Diagnosis not present

## 2020-12-04 DIAGNOSIS — R2681 Unsteadiness on feet: Secondary | ICD-10-CM | POA: Diagnosis not present

## 2020-12-06 DIAGNOSIS — Z89612 Acquired absence of left leg above knee: Secondary | ICD-10-CM | POA: Diagnosis not present

## 2020-12-06 DIAGNOSIS — M6281 Muscle weakness (generalized): Secondary | ICD-10-CM | POA: Diagnosis not present

## 2020-12-06 DIAGNOSIS — Z4781 Encounter for orthopedic aftercare following surgical amputation: Secondary | ICD-10-CM | POA: Diagnosis not present

## 2020-12-09 DIAGNOSIS — R29898 Other symptoms and signs involving the musculoskeletal system: Secondary | ICD-10-CM | POA: Diagnosis not present

## 2020-12-09 DIAGNOSIS — R2681 Unsteadiness on feet: Secondary | ICD-10-CM | POA: Diagnosis not present

## 2020-12-09 DIAGNOSIS — Z89611 Acquired absence of right leg above knee: Secondary | ICD-10-CM | POA: Diagnosis not present

## 2020-12-09 DIAGNOSIS — Z89612 Acquired absence of left leg above knee: Secondary | ICD-10-CM | POA: Diagnosis not present

## 2020-12-11 DIAGNOSIS — M6281 Muscle weakness (generalized): Secondary | ICD-10-CM | POA: Diagnosis not present

## 2020-12-11 DIAGNOSIS — Z96652 Presence of left artificial knee joint: Secondary | ICD-10-CM | POA: Diagnosis not present

## 2020-12-11 DIAGNOSIS — Z89611 Acquired absence of right leg above knee: Secondary | ICD-10-CM | POA: Diagnosis not present

## 2020-12-11 DIAGNOSIS — Z89612 Acquired absence of left leg above knee: Secondary | ICD-10-CM | POA: Diagnosis not present

## 2020-12-11 DIAGNOSIS — R2689 Other abnormalities of gait and mobility: Secondary | ICD-10-CM | POA: Diagnosis not present

## 2020-12-12 DIAGNOSIS — I739 Peripheral vascular disease, unspecified: Secondary | ICD-10-CM | POA: Diagnosis not present

## 2020-12-12 DIAGNOSIS — M329 Systemic lupus erythematosus, unspecified: Secondary | ICD-10-CM | POA: Diagnosis not present

## 2020-12-12 DIAGNOSIS — I7300 Raynaud's syndrome without gangrene: Secondary | ICD-10-CM | POA: Diagnosis not present

## 2020-12-12 DIAGNOSIS — Z89029 Acquired absence of unspecified finger(s): Secondary | ICD-10-CM | POA: Diagnosis not present

## 2020-12-12 DIAGNOSIS — Z96659 Presence of unspecified artificial knee joint: Secondary | ICD-10-CM | POA: Diagnosis not present

## 2020-12-16 DIAGNOSIS — Z96652 Presence of left artificial knee joint: Secondary | ICD-10-CM | POA: Diagnosis not present

## 2020-12-16 DIAGNOSIS — Z89611 Acquired absence of right leg above knee: Secondary | ICD-10-CM | POA: Diagnosis not present

## 2020-12-16 DIAGNOSIS — M6281 Muscle weakness (generalized): Secondary | ICD-10-CM | POA: Diagnosis not present

## 2020-12-16 DIAGNOSIS — R2689 Other abnormalities of gait and mobility: Secondary | ICD-10-CM | POA: Diagnosis not present

## 2020-12-16 DIAGNOSIS — Z89612 Acquired absence of left leg above knee: Secondary | ICD-10-CM | POA: Diagnosis not present

## 2020-12-17 DIAGNOSIS — M6281 Muscle weakness (generalized): Secondary | ICD-10-CM | POA: Diagnosis not present

## 2020-12-17 DIAGNOSIS — Z89612 Acquired absence of left leg above knee: Secondary | ICD-10-CM | POA: Diagnosis not present

## 2020-12-17 DIAGNOSIS — Z89611 Acquired absence of right leg above knee: Secondary | ICD-10-CM | POA: Diagnosis not present

## 2020-12-17 DIAGNOSIS — Z96652 Presence of left artificial knee joint: Secondary | ICD-10-CM | POA: Diagnosis not present

## 2020-12-17 DIAGNOSIS — R2689 Other abnormalities of gait and mobility: Secondary | ICD-10-CM | POA: Diagnosis not present

## 2020-12-18 DIAGNOSIS — Z89611 Acquired absence of right leg above knee: Secondary | ICD-10-CM | POA: Diagnosis not present

## 2020-12-18 DIAGNOSIS — Z96652 Presence of left artificial knee joint: Secondary | ICD-10-CM | POA: Diagnosis not present

## 2020-12-18 DIAGNOSIS — Z89612 Acquired absence of left leg above knee: Secondary | ICD-10-CM | POA: Diagnosis not present

## 2020-12-18 DIAGNOSIS — M6281 Muscle weakness (generalized): Secondary | ICD-10-CM | POA: Diagnosis not present

## 2020-12-18 DIAGNOSIS — R2689 Other abnormalities of gait and mobility: Secondary | ICD-10-CM | POA: Diagnosis not present

## 2020-12-21 DIAGNOSIS — M329 Systemic lupus erythematosus, unspecified: Secondary | ICD-10-CM | POA: Diagnosis not present

## 2020-12-21 DIAGNOSIS — I7300 Raynaud's syndrome without gangrene: Secondary | ICD-10-CM | POA: Diagnosis not present

## 2020-12-21 DIAGNOSIS — I739 Peripheral vascular disease, unspecified: Secondary | ICD-10-CM | POA: Diagnosis not present

## 2020-12-21 DIAGNOSIS — Z89029 Acquired absence of unspecified finger(s): Secondary | ICD-10-CM | POA: Diagnosis not present

## 2020-12-24 DIAGNOSIS — Z23 Encounter for immunization: Secondary | ICD-10-CM | POA: Diagnosis not present

## 2020-12-24 DIAGNOSIS — Z96652 Presence of left artificial knee joint: Secondary | ICD-10-CM | POA: Diagnosis not present

## 2020-12-24 DIAGNOSIS — I10 Essential (primary) hypertension: Secondary | ICD-10-CM | POA: Diagnosis not present

## 2020-12-24 DIAGNOSIS — I7300 Raynaud's syndrome without gangrene: Secondary | ICD-10-CM | POA: Diagnosis not present

## 2020-12-24 DIAGNOSIS — M329 Systemic lupus erythematosus, unspecified: Secondary | ICD-10-CM | POA: Diagnosis not present

## 2020-12-25 DIAGNOSIS — Z96652 Presence of left artificial knee joint: Secondary | ICD-10-CM | POA: Diagnosis not present

## 2020-12-25 DIAGNOSIS — M6281 Muscle weakness (generalized): Secondary | ICD-10-CM | POA: Diagnosis not present

## 2020-12-25 DIAGNOSIS — Z89612 Acquired absence of left leg above knee: Secondary | ICD-10-CM | POA: Diagnosis not present

## 2020-12-25 DIAGNOSIS — R2689 Other abnormalities of gait and mobility: Secondary | ICD-10-CM | POA: Diagnosis not present

## 2020-12-25 DIAGNOSIS — Z89611 Acquired absence of right leg above knee: Secondary | ICD-10-CM | POA: Diagnosis not present

## 2020-12-29 DIAGNOSIS — I7300 Raynaud's syndrome without gangrene: Secondary | ICD-10-CM | POA: Diagnosis not present

## 2020-12-29 DIAGNOSIS — M329 Systemic lupus erythematosus, unspecified: Secondary | ICD-10-CM | POA: Diagnosis not present

## 2021-01-01 DIAGNOSIS — M6281 Muscle weakness (generalized): Secondary | ICD-10-CM | POA: Diagnosis not present

## 2021-01-01 DIAGNOSIS — Z96652 Presence of left artificial knee joint: Secondary | ICD-10-CM | POA: Diagnosis not present

## 2021-01-01 DIAGNOSIS — Z89612 Acquired absence of left leg above knee: Secondary | ICD-10-CM | POA: Diagnosis not present

## 2021-01-01 DIAGNOSIS — Z89611 Acquired absence of right leg above knee: Secondary | ICD-10-CM | POA: Diagnosis not present

## 2021-01-01 DIAGNOSIS — R2689 Other abnormalities of gait and mobility: Secondary | ICD-10-CM | POA: Diagnosis not present

## 2021-01-06 DIAGNOSIS — M329 Systemic lupus erythematosus, unspecified: Secondary | ICD-10-CM | POA: Diagnosis not present

## 2021-01-06 DIAGNOSIS — R2689 Other abnormalities of gait and mobility: Secondary | ICD-10-CM | POA: Diagnosis not present

## 2021-01-06 DIAGNOSIS — Z89612 Acquired absence of left leg above knee: Secondary | ICD-10-CM | POA: Diagnosis not present

## 2021-01-06 DIAGNOSIS — Z96652 Presence of left artificial knee joint: Secondary | ICD-10-CM | POA: Diagnosis not present

## 2021-01-06 DIAGNOSIS — Z89611 Acquired absence of right leg above knee: Secondary | ICD-10-CM | POA: Diagnosis not present

## 2021-01-06 DIAGNOSIS — D591 Autoimmune hemolytic anemia, unspecified: Secondary | ICD-10-CM | POA: Diagnosis not present

## 2021-01-06 DIAGNOSIS — Z4781 Encounter for orthopedic aftercare following surgical amputation: Secondary | ICD-10-CM | POA: Diagnosis not present

## 2021-01-06 DIAGNOSIS — M6281 Muscle weakness (generalized): Secondary | ICD-10-CM | POA: Diagnosis not present

## 2021-01-08 DIAGNOSIS — Z89611 Acquired absence of right leg above knee: Secondary | ICD-10-CM | POA: Diagnosis not present

## 2021-01-08 DIAGNOSIS — Z96652 Presence of left artificial knee joint: Secondary | ICD-10-CM | POA: Diagnosis not present

## 2021-01-08 DIAGNOSIS — Z89612 Acquired absence of left leg above knee: Secondary | ICD-10-CM | POA: Diagnosis not present

## 2021-01-08 DIAGNOSIS — M6281 Muscle weakness (generalized): Secondary | ICD-10-CM | POA: Diagnosis not present

## 2021-01-08 DIAGNOSIS — R2689 Other abnormalities of gait and mobility: Secondary | ICD-10-CM | POA: Diagnosis not present

## 2021-01-08 NOTE — Progress Notes (Deleted)
Cardiology Office Note  Date: 01/08/2021   ID: Crystal, Patterson 10/04/1965, MRN [id number]  PCP:  Rosita Fire, MD  Cardiologist:  Carlyle Dolly, MD Electrophysiologist:  None   Chief Complaint: 1 year follow-up  History of Present Illness: REMEE Patterson is a 55 y.o. female with a history of CAD, PAD, LVH, Raynaud's syndrome, HTN, migraine headaches, GERD, fatty liver disease, SLE, tobacco use disorder.  She was last seen by Dr. Harl Bowie on 01/02/2020.  History of NSTEMI with admission March 2018.  Cardiac cath without significant CAD.  Echocardiogram March 2018 EF 75%, no WMA's, G1 DD, elevated LA pressure, severe LAD.  There was some question about diffuse hypertrophic cardiomyopathy.  Cardiac cath with no significant CAD, right heart cath mean PA pressure 20 with PCWP 12.  She had no recent complaints of chest pain, shortness of breath, DOE.  History of upper extremity PAD, Raynaud's.  She was compliant with her antihypertensive medications.  She was considering knee replacement surgery and she was here for preop evaluation.   She had a left above-knee amputation on 04/04/2020 secondary to dehiscence infected left total knee arthroplasty by Dr. Sharol Given.  Past Medical History:  Diagnosis Date   Anginal pain ([place]) 2020   Arthritis    Knees, hips,   CHF (congestive heart failure) (HCC)    Elevated troponin 05/24/2016   Finger amputation, no complication    GERD (gastroesophageal reflux disease)    Gout    Heart murmur    never has caused any problems per patient 04/02/20   Hypercholesteremia    Hypertension    Infection of total left knee replacement ([place]) 01/25/2020   Lupus ([place])     Past Surgical History:  Procedure Laterality Date   AMPUTATION Left 04/04/2020   Procedure: LEFT ABOVE KNEE AMPUTATION;  Surgeon: Newt Minion, MD;  Location: [facility];  Service: Orthopedics;  Laterality: Left;   CARDIAC CATHETERIZATION  05/25/2016   COLONOSCOPY N/A 01/17/2019   Procedure:  COLONOSCOPY;  Surgeon: Daneil Dolin, MD;  [facility];  Service: Endoscopy;  Laterality: N/A;  10:30   EXCISIONAL TOTAL KNEE ARTHROPLASTY Left 02/05/2020   Procedure: REPEAT IRRIGATION AND DEBRIDEMENT LEFT KNEE;  Surgeon: Mcarthur Rossetti, MD;  Location: [facility];  Service: Orthopedics;  Laterality: Left;   I & D EXTREMITY Left 02/08/2020   Procedure: EXCISIONAL DEBRIDEMENT LEFT KNEE;  Surgeon: Newt Minion, MD;  Location: [facility];  Service: Orthopedics;  Laterality: Left;   I & D KNEE WITH POLY EXCHANGE Left 01/25/2020   Procedure: INCISION AND DRAINAGE LEFT KNEE WITH POLY-LINER EXCHANGE;  Surgeon: Mcarthur Rossetti, MD;  Location: [facility];  Service: Orthopedics;  Laterality: Left;   IRRIGATION AND DEBRIDEMENT KNEE Left 01/30/2020   Procedure: REPEAT IRRIGATION AND DEBRIDEMENT LEFT KNEE PLACEMENT OF WOUND VAC;  Surgeon: Mcarthur Rossetti, MD;  Location: [facility];  Service: Orthopedics;  Laterality: Left;   left finger amputations     POLYPECTOMY  01/17/2019   Procedure: POLYPECTOMY;  Surgeon: Daneil Dolin, MD;  [facility];  Service: Endoscopy;;   right hand surgery     right knee arthroscopy     RIGHT/LEFT HEART CATH AND CORONARY ANGIOGRAPHY N/A 05/25/2016   Procedure: Right/Left Heart Cath and Coronary Angiography;  Surgeon: Troy Sine, MD;  Location: [facility];  Service: Cardiovascular;  Laterality: N/A;   SKIN SPLIT GRAFT Left 02/08/2020   Procedure: SKIN GRAFT SPLIT THICKNESS LEFT KNEE;  Surgeon: Newt Minion, MD;  Location: [facility];  Service: Orthopedics;  Laterality: Left;   TOTAL KNEE ARTHROPLASTY Left 01/04/2020   Procedure: LEFT TOTAL KNEE ARTHROPLASTY;  Surgeon: Mcarthur Rossetti, MD;  Location: [facility];  Service: Orthopedics;  Laterality: Left;    Current Outpatient Medications  Medication Sig Dispense Refill   allopurinol (ZYLOPRIM) 100 MG tablet Take 1 tablet (100 mg total) by mouth daily. 30 tablet 0   aspirin 81 MG  chewable tablet Chew 1 tablet (81 mg total) by mouth 2 (two) times daily. 30 tablet 0   atenolol (TENORMIN) 25 MG tablet Take 1 tablet (25 mg total) by mouth daily. 30 tablet 0   atorvastatin (LIPITOR) 40 MG tablet Take 1 tablet (40 mg total) by mouth daily at 6 PM. 30 tablet 0   benazepril (LOTENSIN) 10 MG tablet Take 1 tablet (10 mg total) by mouth daily. 30 tablet 0   cetaphil (CETAPHIL) lotion Apply 1 application topically 2 (two) times daily.     Ensure (ENSURE) Take 237 mLs by mouth daily.     escitalopram (LEXAPRO) 10 MG tablet Take 1 tablet (10 mg total) by mouth daily. 30 tablet 0   furosemide (LASIX) 40 MG tablet Take 1 tablet (40 mg total) by mouth daily. 30 tablet 0   gabapentin (NEURONTIN) 300 MG capsule Take 1 capsule (300 mg total) by mouth daily. 30 capsule 0   methocarbamol (ROBAXIN) 500 MG tablet Take 1 tablet (500 mg total) by mouth every 8 (eight) hours as needed for muscle spasms. 30 tablet 0   omeprazole (PRILOSEC) 20 MG capsule Take 1 capsule (20 mg total) by mouth daily. 30 capsule 0   oxyCODONE (OXY IR/ROXICODONE) 5 MG immediate release tablet Take 1 tablet (5 mg total) by mouth every 8 (eight) hours as needed for severe pain. 15 tablet 0   potassium chloride (KLOR-CON) 10 MEQ tablet Take 1 tablet (10 mEq total) by mouth daily. 30 tablet 0   sulfamethoxazole-trimethoprim (BACTRIM DS) 800-160 MG tablet Take 1 tablet by mouth 2 (two) times daily. 20 tablet 0   zolpidem (AMBIEN) 10 MG tablet Take 1 tablet (10 mg total) by mouth at bedtime as needed for sleep. 15 tablet 0   No current facility-administered medications for this visit.   Allergies:  Patient has no known allergies.   Social History: The patient  reports that she has been smoking cigarettes. She has a 1.50 pack-year smoking history. She has never used smokeless tobacco. She reports current alcohol use of about 2.0 - 3.0 standard drinks per week. She reports current drug use. Drug: Marijuana.   Family History:  The patient's family history includes Colon cancer in her father; Pancreatic cancer in her mother.   ROS:  Please see the history of present illness. Otherwise, complete review of systems is positive for {NONE DEFAULTED:18576}.  All other systems are reviewed and negative.   Physical Exam: VS:  LMP  (LMP Unknown) , BMI There is no height or weight on file to calculate BMI.  Wt Readings from Last 3 Encounters:  04/25/20 121 lb 3.2 oz (55 kg)  04/22/20 125 lb (56.7 kg)  04/15/20 125 lb (56.7 kg)    General: Patient appears comfortable at rest. HEENT: Conjunctiva and lids normal, oropharynx clear with moist mucosa. Neck: Supple, no elevated JVP or carotid bruits, no thyromegaly. Lungs: Clear to auscultation, nonlabored breathing at rest. Cardiac: Regular rate and rhythm, no S3 or significant systolic murmur, no pericardial rub. Abdomen: Soft,  nontender, no hepatomegaly, bowel sounds present, no guarding or rebound. Extremities: No pitting edema, distal pulses 2+. Skin: Warm and dry. Musculoskeletal: No kyphosis. Neuropsychiatric: Alert and oriented x3, affect grossly appropriate.  ECG:  {EKG/Telemetry Strips [phone]}  Recent Labwork: 04/04/2020: ALT 14; AST 26; BUN 8; Creatinine, Ser 0.71; Potassium 3.5; Sodium 135 04/06/2020: Hemoglobin 8.4; Platelets 164     Component Value Date/Time   CHOL 134 05/25/2016 0543   TRIG 63 05/25/2016 0543   HDL 39 (L) 05/25/2016 0543   CHOLHDL 3.4 05/25/2016 0543   VLDL 13 05/25/2016 0543   LDLCALC 82 05/25/2016 0543    Other Studies Reviewed Today:  04/2016 echo Study Conclusions   - Left ventricle: Small LV cavity. Wall thickness was increased in   a pattern of severe LVH. The estimated ejection fraction was 75%.   Wall motion was normal; there were no regional wall motion   abnormalities. Doppler parameters are consistent with abnormal   left ventricular relaxation (grade 1 diastolic dysfunction). The   E/e&' ratio is >20,  suggesting markedly elevated LV filling   pressure. - Mitral valve: Calcified annulus. Mildly thickened leaflets . Mild   stenosis. There was mild regurgitation. Valve area by pressure   half-time: 2.18 cm^2. - Left atrium: Severely dilated. - Inferior vena cava: The vessel was normal in size. The   respirophasic diameter changes were in the normal range (= 50%),   consistent with normal central venous pressure.   Impressions:   - LVEF 75%, severely increased LV wall thickness with small LV   cavity, grade 1 DD with high LV filling pressure, moderate MAC   with mild mitral stenosis and mild regurgitation, severe LAE,   normal IVC, no pericardial efusion. Consider global variant   hypertrophic cardiomopathy given the degree of LV wall thickness   and cardiac mass.     04/2016 Cath Dominance: Right  Left Main  Vessel was injected. Vessel is normal in caliber. Vessel is angiographically normal.  Left Anterior Descending  Vessel was injected. Vessel is normal in caliber. Vessel is angiographically normal.  Ramus Intermedius  Vessel is small.  Left Circumflex  Vessel was injected. Vessel is normal in caliber. Vessel is angiographically normal.  Right Coronary Artery  Vessel was injected. Vessel is normal in caliber. Vessel is angiographically normal.  Right Heart    Right Heart Pressures RA: A 10, V 6; mean 5 RV: 36/7 PA: 33/13; mean 20 PW: a 24 V 17; mean 12  AO: 98/65 PA: 34/16  PW Mean 15 LV: 96/16  LV: 98/16 AO: 99/76  Oxygen saturation in the pulmonary artery, 77% and in the aorta 99%.  Cardiac output by the thermodilution method 3.3, and by the Fick method 5.5 L/m. Cardiac index by the thermodilution method 2.0, and by the Fick method 3.3 L/m/m.           Assessment and Plan:  No diagnosis found.   Medication Adjustments/Labs and Tests Ordered: Current medicines are reviewed at length with the patient today.  Concerns regarding medicines are outlined  above.   Disposition: Follow-up with ***  Signed, Levell July, NP 01/08/2021 12:25 PM    [facility] [place], [place], [place] [address] Phone: [phone]; Fax: [phone]

## 2021-01-09 ENCOUNTER — Ambulatory Visit: Payer: Medicare Other | Admitting: Family Medicine

## 2021-01-12 DIAGNOSIS — I7300 Raynaud's syndrome without gangrene: Secondary | ICD-10-CM | POA: Diagnosis not present

## 2021-01-12 DIAGNOSIS — I739 Peripheral vascular disease, unspecified: Secondary | ICD-10-CM | POA: Diagnosis not present

## 2021-01-12 DIAGNOSIS — M329 Systemic lupus erythematosus, unspecified: Secondary | ICD-10-CM | POA: Diagnosis not present

## 2021-01-12 DIAGNOSIS — Z96659 Presence of unspecified artificial knee joint: Secondary | ICD-10-CM | POA: Diagnosis not present

## 2021-01-12 DIAGNOSIS — Z89029 Acquired absence of unspecified finger(s): Secondary | ICD-10-CM | POA: Diagnosis not present

## 2021-01-13 DIAGNOSIS — Z978 Presence of other specified devices: Secondary | ICD-10-CM | POA: Diagnosis not present

## 2021-01-13 DIAGNOSIS — Z89612 Acquired absence of left leg above knee: Secondary | ICD-10-CM | POA: Diagnosis not present

## 2021-01-13 DIAGNOSIS — M79606 Pain in leg, unspecified: Secondary | ICD-10-CM | POA: Diagnosis not present

## 2021-01-13 DIAGNOSIS — R29898 Other symptoms and signs involving the musculoskeletal system: Secondary | ICD-10-CM | POA: Diagnosis not present

## 2021-01-13 DIAGNOSIS — R2681 Unsteadiness on feet: Secondary | ICD-10-CM | POA: Diagnosis not present

## 2021-01-13 DIAGNOSIS — Z89611 Acquired absence of right leg above knee: Secondary | ICD-10-CM | POA: Diagnosis not present

## 2021-01-15 DIAGNOSIS — Z89612 Acquired absence of left leg above knee: Secondary | ICD-10-CM | POA: Diagnosis not present

## 2021-01-15 DIAGNOSIS — M79606 Pain in leg, unspecified: Secondary | ICD-10-CM | POA: Diagnosis not present

## 2021-01-15 DIAGNOSIS — Z978 Presence of other specified devices: Secondary | ICD-10-CM | POA: Diagnosis not present

## 2021-01-15 DIAGNOSIS — R29898 Other symptoms and signs involving the musculoskeletal system: Secondary | ICD-10-CM | POA: Diagnosis not present

## 2021-01-15 DIAGNOSIS — Z89611 Acquired absence of right leg above knee: Secondary | ICD-10-CM | POA: Diagnosis not present

## 2021-01-15 DIAGNOSIS — R2681 Unsteadiness on feet: Secondary | ICD-10-CM | POA: Diagnosis not present

## 2021-01-21 DIAGNOSIS — M329 Systemic lupus erythematosus, unspecified: Secondary | ICD-10-CM | POA: Diagnosis not present

## 2021-01-21 DIAGNOSIS — Z89029 Acquired absence of unspecified finger(s): Secondary | ICD-10-CM | POA: Diagnosis not present

## 2021-01-21 DIAGNOSIS — I739 Peripheral vascular disease, unspecified: Secondary | ICD-10-CM | POA: Diagnosis not present

## 2021-01-21 DIAGNOSIS — I7300 Raynaud's syndrome without gangrene: Secondary | ICD-10-CM | POA: Diagnosis not present

## 2021-01-24 DIAGNOSIS — I10 Essential (primary) hypertension: Secondary | ICD-10-CM | POA: Diagnosis not present

## 2021-01-24 DIAGNOSIS — N189 Chronic kidney disease, unspecified: Secondary | ICD-10-CM | POA: Diagnosis not present

## 2021-01-24 DIAGNOSIS — I739 Peripheral vascular disease, unspecified: Secondary | ICD-10-CM | POA: Diagnosis not present

## 2021-01-27 DIAGNOSIS — M79606 Pain in leg, unspecified: Secondary | ICD-10-CM | POA: Diagnosis not present

## 2021-01-27 DIAGNOSIS — Z7689 Persons encountering health services in other specified circumstances: Secondary | ICD-10-CM | POA: Diagnosis not present

## 2021-01-27 DIAGNOSIS — Z89611 Acquired absence of right leg above knee: Secondary | ICD-10-CM | POA: Diagnosis not present

## 2021-01-27 DIAGNOSIS — R29898 Other symptoms and signs involving the musculoskeletal system: Secondary | ICD-10-CM | POA: Diagnosis not present

## 2021-01-27 DIAGNOSIS — Z89612 Acquired absence of left leg above knee: Secondary | ICD-10-CM | POA: Diagnosis not present

## 2021-01-27 DIAGNOSIS — Z978 Presence of other specified devices: Secondary | ICD-10-CM | POA: Diagnosis not present

## 2021-01-27 DIAGNOSIS — R2681 Unsteadiness on feet: Secondary | ICD-10-CM | POA: Diagnosis not present

## 2021-01-29 DIAGNOSIS — Z89612 Acquired absence of left leg above knee: Secondary | ICD-10-CM | POA: Diagnosis not present

## 2021-01-29 DIAGNOSIS — Z89611 Acquired absence of right leg above knee: Secondary | ICD-10-CM | POA: Diagnosis not present

## 2021-01-29 DIAGNOSIS — R2681 Unsteadiness on feet: Secondary | ICD-10-CM | POA: Diagnosis not present

## 2021-01-29 DIAGNOSIS — Z7689 Persons encountering health services in other specified circumstances: Secondary | ICD-10-CM | POA: Diagnosis not present

## 2021-01-29 DIAGNOSIS — Z978 Presence of other specified devices: Secondary | ICD-10-CM | POA: Diagnosis not present

## 2021-01-29 DIAGNOSIS — R29898 Other symptoms and signs involving the musculoskeletal system: Secondary | ICD-10-CM | POA: Diagnosis not present

## 2021-01-29 DIAGNOSIS — M79606 Pain in leg, unspecified: Secondary | ICD-10-CM | POA: Diagnosis not present

## 2021-02-03 DIAGNOSIS — Z978 Presence of other specified devices: Secondary | ICD-10-CM | POA: Diagnosis not present

## 2021-02-03 DIAGNOSIS — M79606 Pain in leg, unspecified: Secondary | ICD-10-CM | POA: Diagnosis not present

## 2021-02-03 DIAGNOSIS — R2681 Unsteadiness on feet: Secondary | ICD-10-CM | POA: Diagnosis not present

## 2021-02-03 DIAGNOSIS — Z7689 Persons encountering health services in other specified circumstances: Secondary | ICD-10-CM | POA: Diagnosis not present

## 2021-02-03 DIAGNOSIS — Z89612 Acquired absence of left leg above knee: Secondary | ICD-10-CM | POA: Diagnosis not present

## 2021-02-03 DIAGNOSIS — Z89611 Acquired absence of right leg above knee: Secondary | ICD-10-CM | POA: Diagnosis not present

## 2021-02-03 DIAGNOSIS — R29898 Other symptoms and signs involving the musculoskeletal system: Secondary | ICD-10-CM | POA: Diagnosis not present

## 2021-02-05 DIAGNOSIS — Z4781 Encounter for orthopedic aftercare following surgical amputation: Secondary | ICD-10-CM | POA: Diagnosis not present

## 2021-02-05 DIAGNOSIS — Z7689 Persons encountering health services in other specified circumstances: Secondary | ICD-10-CM | POA: Diagnosis not present

## 2021-02-05 DIAGNOSIS — M79606 Pain in leg, unspecified: Secondary | ICD-10-CM | POA: Diagnosis not present

## 2021-02-05 DIAGNOSIS — R2681 Unsteadiness on feet: Secondary | ICD-10-CM | POA: Diagnosis not present

## 2021-02-05 DIAGNOSIS — R29898 Other symptoms and signs involving the musculoskeletal system: Secondary | ICD-10-CM | POA: Diagnosis not present

## 2021-02-05 DIAGNOSIS — Z89611 Acquired absence of right leg above knee: Secondary | ICD-10-CM | POA: Diagnosis not present

## 2021-02-05 DIAGNOSIS — Z978 Presence of other specified devices: Secondary | ICD-10-CM | POA: Diagnosis not present

## 2021-02-05 DIAGNOSIS — M6281 Muscle weakness (generalized): Secondary | ICD-10-CM | POA: Diagnosis not present

## 2021-02-05 DIAGNOSIS — Z89612 Acquired absence of left leg above knee: Secondary | ICD-10-CM | POA: Diagnosis not present

## 2021-02-10 DIAGNOSIS — R29898 Other symptoms and signs involving the musculoskeletal system: Secondary | ICD-10-CM | POA: Diagnosis not present

## 2021-02-10 DIAGNOSIS — Z89612 Acquired absence of left leg above knee: Secondary | ICD-10-CM | POA: Diagnosis not present

## 2021-02-10 DIAGNOSIS — R2681 Unsteadiness on feet: Secondary | ICD-10-CM | POA: Diagnosis not present

## 2021-02-10 DIAGNOSIS — M79606 Pain in leg, unspecified: Secondary | ICD-10-CM | POA: Diagnosis not present

## 2021-02-10 DIAGNOSIS — Z978 Presence of other specified devices: Secondary | ICD-10-CM | POA: Diagnosis not present

## 2021-02-10 DIAGNOSIS — Z89611 Acquired absence of right leg above knee: Secondary | ICD-10-CM | POA: Diagnosis not present

## 2021-02-10 DIAGNOSIS — Z7689 Persons encountering health services in other specified circumstances: Secondary | ICD-10-CM | POA: Diagnosis not present

## 2021-02-11 DIAGNOSIS — I739 Peripheral vascular disease, unspecified: Secondary | ICD-10-CM | POA: Diagnosis not present

## 2021-02-11 DIAGNOSIS — I7300 Raynaud's syndrome without gangrene: Secondary | ICD-10-CM | POA: Diagnosis not present

## 2021-02-11 DIAGNOSIS — Z89029 Acquired absence of unspecified finger(s): Secondary | ICD-10-CM | POA: Diagnosis not present

## 2021-02-11 DIAGNOSIS — M329 Systemic lupus erythematosus, unspecified: Secondary | ICD-10-CM | POA: Diagnosis not present

## 2021-02-11 DIAGNOSIS — Z96659 Presence of unspecified artificial knee joint: Secondary | ICD-10-CM | POA: Diagnosis not present

## 2021-02-12 DIAGNOSIS — Z89612 Acquired absence of left leg above knee: Secondary | ICD-10-CM | POA: Diagnosis not present

## 2021-02-12 DIAGNOSIS — Z89611 Acquired absence of right leg above knee: Secondary | ICD-10-CM | POA: Diagnosis not present

## 2021-02-12 DIAGNOSIS — Z978 Presence of other specified devices: Secondary | ICD-10-CM | POA: Diagnosis not present

## 2021-02-12 DIAGNOSIS — R2681 Unsteadiness on feet: Secondary | ICD-10-CM | POA: Diagnosis not present

## 2021-02-12 DIAGNOSIS — M79606 Pain in leg, unspecified: Secondary | ICD-10-CM | POA: Diagnosis not present

## 2021-02-12 DIAGNOSIS — R29898 Other symptoms and signs involving the musculoskeletal system: Secondary | ICD-10-CM | POA: Diagnosis not present

## 2021-02-12 DIAGNOSIS — Z7689 Persons encountering health services in other specified circumstances: Secondary | ICD-10-CM | POA: Diagnosis not present

## 2021-02-16 DIAGNOSIS — Z89611 Acquired absence of right leg above knee: Secondary | ICD-10-CM | POA: Diagnosis not present

## 2021-02-16 DIAGNOSIS — R29898 Other symptoms and signs involving the musculoskeletal system: Secondary | ICD-10-CM | POA: Diagnosis not present

## 2021-02-16 DIAGNOSIS — Z978 Presence of other specified devices: Secondary | ICD-10-CM | POA: Diagnosis not present

## 2021-02-16 DIAGNOSIS — Z89612 Acquired absence of left leg above knee: Secondary | ICD-10-CM | POA: Diagnosis not present

## 2021-02-16 DIAGNOSIS — R2681 Unsteadiness on feet: Secondary | ICD-10-CM | POA: Diagnosis not present

## 2021-02-16 DIAGNOSIS — M79606 Pain in leg, unspecified: Secondary | ICD-10-CM | POA: Diagnosis not present

## 2021-02-16 DIAGNOSIS — Z7689 Persons encountering health services in other specified circumstances: Secondary | ICD-10-CM | POA: Diagnosis not present

## 2021-02-19 DIAGNOSIS — R2681 Unsteadiness on feet: Secondary | ICD-10-CM | POA: Diagnosis not present

## 2021-02-19 DIAGNOSIS — M79606 Pain in leg, unspecified: Secondary | ICD-10-CM | POA: Diagnosis not present

## 2021-02-19 DIAGNOSIS — Z89612 Acquired absence of left leg above knee: Secondary | ICD-10-CM | POA: Diagnosis not present

## 2021-02-19 DIAGNOSIS — R29898 Other symptoms and signs involving the musculoskeletal system: Secondary | ICD-10-CM | POA: Diagnosis not present

## 2021-02-19 DIAGNOSIS — Z978 Presence of other specified devices: Secondary | ICD-10-CM | POA: Diagnosis not present

## 2021-02-19 DIAGNOSIS — Z7689 Persons encountering health services in other specified circumstances: Secondary | ICD-10-CM | POA: Diagnosis not present

## 2021-02-19 DIAGNOSIS — Z89611 Acquired absence of right leg above knee: Secondary | ICD-10-CM | POA: Diagnosis not present

## 2021-02-20 DIAGNOSIS — I7300 Raynaud's syndrome without gangrene: Secondary | ICD-10-CM | POA: Diagnosis not present

## 2021-02-20 DIAGNOSIS — Z89029 Acquired absence of unspecified finger(s): Secondary | ICD-10-CM | POA: Diagnosis not present

## 2021-02-20 DIAGNOSIS — I739 Peripheral vascular disease, unspecified: Secondary | ICD-10-CM | POA: Diagnosis not present

## 2021-02-20 DIAGNOSIS — M329 Systemic lupus erythematosus, unspecified: Secondary | ICD-10-CM | POA: Diagnosis not present

## 2021-02-23 DIAGNOSIS — I10 Essential (primary) hypertension: Secondary | ICD-10-CM | POA: Diagnosis not present

## 2021-02-23 DIAGNOSIS — I739 Peripheral vascular disease, unspecified: Secondary | ICD-10-CM | POA: Diagnosis not present

## 2021-03-06 DIAGNOSIS — M79606 Pain in leg, unspecified: Secondary | ICD-10-CM | POA: Diagnosis not present

## 2021-03-06 DIAGNOSIS — R0602 Shortness of breath: Secondary | ICD-10-CM | POA: Diagnosis not present

## 2021-03-06 DIAGNOSIS — U071 COVID-19: Secondary | ICD-10-CM | POA: Diagnosis not present

## 2021-03-06 DIAGNOSIS — R29898 Other symptoms and signs involving the musculoskeletal system: Secondary | ICD-10-CM | POA: Diagnosis not present

## 2021-03-06 DIAGNOSIS — Z978 Presence of other specified devices: Secondary | ICD-10-CM | POA: Diagnosis not present

## 2021-03-06 DIAGNOSIS — Z79899 Other long term (current) drug therapy: Secondary | ICD-10-CM | POA: Diagnosis not present

## 2021-03-06 DIAGNOSIS — R2681 Unsteadiness on feet: Secondary | ICD-10-CM | POA: Diagnosis not present

## 2021-03-06 DIAGNOSIS — F17200 Nicotine dependence, unspecified, uncomplicated: Secondary | ICD-10-CM | POA: Diagnosis not present

## 2021-03-06 DIAGNOSIS — Z89612 Acquired absence of left leg above knee: Secondary | ICD-10-CM | POA: Diagnosis not present

## 2021-03-06 DIAGNOSIS — Z7689 Persons encountering health services in other specified circumstances: Secondary | ICD-10-CM | POA: Diagnosis not present

## 2021-03-06 DIAGNOSIS — Z89611 Acquired absence of right leg above knee: Secondary | ICD-10-CM | POA: Diagnosis not present

## 2021-03-06 DIAGNOSIS — R0600 Dyspnea, unspecified: Secondary | ICD-10-CM | POA: Diagnosis not present

## 2021-03-06 DIAGNOSIS — I119 Hypertensive heart disease without heart failure: Secondary | ICD-10-CM | POA: Diagnosis not present

## 2021-03-08 DIAGNOSIS — M6281 Muscle weakness (generalized): Secondary | ICD-10-CM | POA: Diagnosis not present

## 2021-03-08 DIAGNOSIS — Z4781 Encounter for orthopedic aftercare following surgical amputation: Secondary | ICD-10-CM | POA: Diagnosis not present

## 2021-03-08 DIAGNOSIS — Z89612 Acquired absence of left leg above knee: Secondary | ICD-10-CM | POA: Diagnosis not present

## 2021-03-14 DIAGNOSIS — Z96659 Presence of unspecified artificial knee joint: Secondary | ICD-10-CM | POA: Diagnosis not present

## 2021-03-14 DIAGNOSIS — Z89029 Acquired absence of unspecified finger(s): Secondary | ICD-10-CM | POA: Diagnosis not present

## 2021-03-14 DIAGNOSIS — M329 Systemic lupus erythematosus, unspecified: Secondary | ICD-10-CM | POA: Diagnosis not present

## 2021-03-14 DIAGNOSIS — I7300 Raynaud's syndrome without gangrene: Secondary | ICD-10-CM | POA: Diagnosis not present

## 2021-03-14 DIAGNOSIS — I739 Peripheral vascular disease, unspecified: Secondary | ICD-10-CM | POA: Diagnosis not present

## 2021-03-27 DIAGNOSIS — I739 Peripheral vascular disease, unspecified: Secondary | ICD-10-CM | POA: Diagnosis not present

## 2021-03-27 DIAGNOSIS — I10 Essential (primary) hypertension: Secondary | ICD-10-CM | POA: Diagnosis not present

## 2021-03-30 DIAGNOSIS — Z7689 Persons encountering health services in other specified circumstances: Secondary | ICD-10-CM | POA: Diagnosis not present

## 2021-04-01 DIAGNOSIS — Z7689 Persons encountering health services in other specified circumstances: Secondary | ICD-10-CM | POA: Diagnosis not present

## 2021-04-01 DIAGNOSIS — Z96652 Presence of left artificial knee joint: Secondary | ICD-10-CM | POA: Diagnosis not present

## 2021-04-01 DIAGNOSIS — Z89612 Acquired absence of left leg above knee: Secondary | ICD-10-CM | POA: Diagnosis not present

## 2021-04-01 DIAGNOSIS — I10 Essential (primary) hypertension: Secondary | ICD-10-CM | POA: Diagnosis not present

## 2021-04-01 DIAGNOSIS — Z89029 Acquired absence of unspecified finger(s): Secondary | ICD-10-CM | POA: Diagnosis not present

## 2021-04-08 DIAGNOSIS — Z4781 Encounter for orthopedic aftercare following surgical amputation: Secondary | ICD-10-CM | POA: Diagnosis not present

## 2021-04-08 DIAGNOSIS — Z89612 Acquired absence of left leg above knee: Secondary | ICD-10-CM | POA: Diagnosis not present

## 2021-04-08 DIAGNOSIS — M6281 Muscle weakness (generalized): Secondary | ICD-10-CM | POA: Diagnosis not present

## 2021-04-14 DIAGNOSIS — Z96659 Presence of unspecified artificial knee joint: Secondary | ICD-10-CM | POA: Diagnosis not present

## 2021-04-14 DIAGNOSIS — I7300 Raynaud's syndrome without gangrene: Secondary | ICD-10-CM | POA: Diagnosis not present

## 2021-04-14 DIAGNOSIS — Z89029 Acquired absence of unspecified finger(s): Secondary | ICD-10-CM | POA: Diagnosis not present

## 2021-04-14 DIAGNOSIS — I739 Peripheral vascular disease, unspecified: Secondary | ICD-10-CM | POA: Diagnosis not present

## 2021-04-14 DIAGNOSIS — M329 Systemic lupus erythematosus, unspecified: Secondary | ICD-10-CM | POA: Diagnosis not present

## 2021-04-15 DIAGNOSIS — Z89611 Acquired absence of right leg above knee: Secondary | ICD-10-CM | POA: Diagnosis not present

## 2021-04-15 DIAGNOSIS — R2681 Unsteadiness on feet: Secondary | ICD-10-CM | POA: Diagnosis not present

## 2021-04-15 DIAGNOSIS — Z978 Presence of other specified devices: Secondary | ICD-10-CM | POA: Diagnosis not present

## 2021-04-15 DIAGNOSIS — Z7689 Persons encountering health services in other specified circumstances: Secondary | ICD-10-CM | POA: Diagnosis not present

## 2021-04-15 DIAGNOSIS — M79606 Pain in leg, unspecified: Secondary | ICD-10-CM | POA: Diagnosis not present

## 2021-04-15 DIAGNOSIS — R29898 Other symptoms and signs involving the musculoskeletal system: Secondary | ICD-10-CM | POA: Diagnosis not present

## 2021-04-15 DIAGNOSIS — Z89612 Acquired absence of left leg above knee: Secondary | ICD-10-CM | POA: Diagnosis not present

## 2021-04-22 DIAGNOSIS — Z89612 Acquired absence of left leg above knee: Secondary | ICD-10-CM | POA: Diagnosis not present

## 2021-04-22 DIAGNOSIS — R29898 Other symptoms and signs involving the musculoskeletal system: Secondary | ICD-10-CM | POA: Diagnosis not present

## 2021-04-22 DIAGNOSIS — Z89611 Acquired absence of right leg above knee: Secondary | ICD-10-CM | POA: Diagnosis not present

## 2021-04-22 DIAGNOSIS — M79606 Pain in leg, unspecified: Secondary | ICD-10-CM | POA: Diagnosis not present

## 2021-04-22 DIAGNOSIS — R2681 Unsteadiness on feet: Secondary | ICD-10-CM | POA: Diagnosis not present

## 2021-04-22 DIAGNOSIS — Z7689 Persons encountering health services in other specified circumstances: Secondary | ICD-10-CM | POA: Diagnosis not present

## 2021-04-22 DIAGNOSIS — Z978 Presence of other specified devices: Secondary | ICD-10-CM | POA: Diagnosis not present

## 2021-04-29 DIAGNOSIS — M329 Systemic lupus erythematosus, unspecified: Secondary | ICD-10-CM | POA: Diagnosis not present

## 2021-04-29 DIAGNOSIS — Z89611 Acquired absence of right leg above knee: Secondary | ICD-10-CM | POA: Diagnosis not present

## 2021-04-29 DIAGNOSIS — Z89612 Acquired absence of left leg above knee: Secondary | ICD-10-CM | POA: Diagnosis not present

## 2021-04-29 DIAGNOSIS — Z7689 Persons encountering health services in other specified circumstances: Secondary | ICD-10-CM | POA: Diagnosis not present

## 2021-04-29 DIAGNOSIS — R2681 Unsteadiness on feet: Secondary | ICD-10-CM | POA: Diagnosis not present

## 2021-04-29 DIAGNOSIS — R29898 Other symptoms and signs involving the musculoskeletal system: Secondary | ICD-10-CM | POA: Diagnosis not present

## 2021-04-29 DIAGNOSIS — Z978 Presence of other specified devices: Secondary | ICD-10-CM | POA: Diagnosis not present

## 2021-04-29 DIAGNOSIS — M79606 Pain in leg, unspecified: Secondary | ICD-10-CM | POA: Diagnosis not present

## 2021-04-29 DIAGNOSIS — I10 Essential (primary) hypertension: Secondary | ICD-10-CM | POA: Diagnosis not present

## 2021-05-01 DIAGNOSIS — M79606 Pain in leg, unspecified: Secondary | ICD-10-CM | POA: Diagnosis not present

## 2021-05-01 DIAGNOSIS — L03113 Cellulitis of right upper limb: Secondary | ICD-10-CM | POA: Diagnosis not present

## 2021-05-01 DIAGNOSIS — Z978 Presence of other specified devices: Secondary | ICD-10-CM | POA: Diagnosis not present

## 2021-05-01 DIAGNOSIS — R29898 Other symptoms and signs involving the musculoskeletal system: Secondary | ICD-10-CM | POA: Diagnosis not present

## 2021-05-01 DIAGNOSIS — S60221D Contusion of right hand, subsequent encounter: Secondary | ICD-10-CM | POA: Diagnosis not present

## 2021-05-01 DIAGNOSIS — Z7689 Persons encountering health services in other specified circumstances: Secondary | ICD-10-CM | POA: Diagnosis not present

## 2021-05-01 DIAGNOSIS — Z89612 Acquired absence of left leg above knee: Secondary | ICD-10-CM | POA: Diagnosis not present

## 2021-05-01 DIAGNOSIS — S60221A Contusion of right hand, initial encounter: Secondary | ICD-10-CM | POA: Diagnosis not present

## 2021-05-01 DIAGNOSIS — R2681 Unsteadiness on feet: Secondary | ICD-10-CM | POA: Diagnosis not present

## 2021-05-01 DIAGNOSIS — Z89611 Acquired absence of right leg above knee: Secondary | ICD-10-CM | POA: Diagnosis not present

## 2021-05-01 DIAGNOSIS — S68110A Complete traumatic metacarpophalangeal amputation of right index finger, initial encounter: Secondary | ICD-10-CM | POA: Diagnosis not present

## 2021-05-01 DIAGNOSIS — L03011 Cellulitis of right finger: Secondary | ICD-10-CM | POA: Diagnosis not present

## 2021-05-01 DIAGNOSIS — F17200 Nicotine dependence, unspecified, uncomplicated: Secondary | ICD-10-CM | POA: Diagnosis not present

## 2021-05-01 DIAGNOSIS — Z79899 Other long term (current) drug therapy: Secondary | ICD-10-CM | POA: Diagnosis not present

## 2021-05-01 DIAGNOSIS — I10 Essential (primary) hypertension: Secondary | ICD-10-CM | POA: Diagnosis not present

## 2021-05-01 DIAGNOSIS — Z89021 Acquired absence of right finger(s): Secondary | ICD-10-CM | POA: Diagnosis not present

## 2021-05-01 DIAGNOSIS — X58XXXA Exposure to other specified factors, initial encounter: Secondary | ICD-10-CM | POA: Diagnosis not present

## 2021-05-06 DIAGNOSIS — Z4781 Encounter for orthopedic aftercare following surgical amputation: Secondary | ICD-10-CM | POA: Diagnosis not present

## 2021-05-06 DIAGNOSIS — Z978 Presence of other specified devices: Secondary | ICD-10-CM | POA: Diagnosis not present

## 2021-05-06 DIAGNOSIS — R29898 Other symptoms and signs involving the musculoskeletal system: Secondary | ICD-10-CM | POA: Diagnosis not present

## 2021-05-06 DIAGNOSIS — Z89611 Acquired absence of right leg above knee: Secondary | ICD-10-CM | POA: Diagnosis not present

## 2021-05-06 DIAGNOSIS — Z7689 Persons encountering health services in other specified circumstances: Secondary | ICD-10-CM | POA: Diagnosis not present

## 2021-05-06 DIAGNOSIS — M79606 Pain in leg, unspecified: Secondary | ICD-10-CM | POA: Diagnosis not present

## 2021-05-06 DIAGNOSIS — R2681 Unsteadiness on feet: Secondary | ICD-10-CM | POA: Diagnosis not present

## 2021-05-06 DIAGNOSIS — M6281 Muscle weakness (generalized): Secondary | ICD-10-CM | POA: Diagnosis not present

## 2021-05-06 DIAGNOSIS — Z89612 Acquired absence of left leg above knee: Secondary | ICD-10-CM | POA: Diagnosis not present

## 2021-05-08 DIAGNOSIS — Z89611 Acquired absence of right leg above knee: Secondary | ICD-10-CM | POA: Diagnosis not present

## 2021-05-08 DIAGNOSIS — Z978 Presence of other specified devices: Secondary | ICD-10-CM | POA: Diagnosis not present

## 2021-05-08 DIAGNOSIS — R2681 Unsteadiness on feet: Secondary | ICD-10-CM | POA: Diagnosis not present

## 2021-05-08 DIAGNOSIS — Z7689 Persons encountering health services in other specified circumstances: Secondary | ICD-10-CM | POA: Diagnosis not present

## 2021-05-08 DIAGNOSIS — R29898 Other symptoms and signs involving the musculoskeletal system: Secondary | ICD-10-CM | POA: Diagnosis not present

## 2021-05-08 DIAGNOSIS — Z89612 Acquired absence of left leg above knee: Secondary | ICD-10-CM | POA: Diagnosis not present

## 2021-05-08 DIAGNOSIS — M79606 Pain in leg, unspecified: Secondary | ICD-10-CM | POA: Diagnosis not present

## 2021-05-13 DIAGNOSIS — R29898 Other symptoms and signs involving the musculoskeletal system: Secondary | ICD-10-CM | POA: Diagnosis not present

## 2021-05-13 DIAGNOSIS — M79606 Pain in leg, unspecified: Secondary | ICD-10-CM | POA: Diagnosis not present

## 2021-05-13 DIAGNOSIS — Z89612 Acquired absence of left leg above knee: Secondary | ICD-10-CM | POA: Diagnosis not present

## 2021-05-13 DIAGNOSIS — Z7689 Persons encountering health services in other specified circumstances: Secondary | ICD-10-CM | POA: Diagnosis not present

## 2021-05-13 DIAGNOSIS — Z89611 Acquired absence of right leg above knee: Secondary | ICD-10-CM | POA: Diagnosis not present

## 2021-05-13 DIAGNOSIS — Z978 Presence of other specified devices: Secondary | ICD-10-CM | POA: Diagnosis not present

## 2021-05-13 DIAGNOSIS — R2681 Unsteadiness on feet: Secondary | ICD-10-CM | POA: Diagnosis not present

## 2021-05-15 DIAGNOSIS — Z978 Presence of other specified devices: Secondary | ICD-10-CM | POA: Diagnosis not present

## 2021-05-15 DIAGNOSIS — Z7689 Persons encountering health services in other specified circumstances: Secondary | ICD-10-CM | POA: Diagnosis not present

## 2021-05-15 DIAGNOSIS — Z89611 Acquired absence of right leg above knee: Secondary | ICD-10-CM | POA: Diagnosis not present

## 2021-05-15 DIAGNOSIS — R2681 Unsteadiness on feet: Secondary | ICD-10-CM | POA: Diagnosis not present

## 2021-05-15 DIAGNOSIS — R29898 Other symptoms and signs involving the musculoskeletal system: Secondary | ICD-10-CM | POA: Diagnosis not present

## 2021-05-15 DIAGNOSIS — M79606 Pain in leg, unspecified: Secondary | ICD-10-CM | POA: Diagnosis not present

## 2021-05-15 DIAGNOSIS — Z89612 Acquired absence of left leg above knee: Secondary | ICD-10-CM | POA: Diagnosis not present

## 2021-05-20 DIAGNOSIS — R29898 Other symptoms and signs involving the musculoskeletal system: Secondary | ICD-10-CM | POA: Diagnosis not present

## 2021-05-20 DIAGNOSIS — Z89612 Acquired absence of left leg above knee: Secondary | ICD-10-CM | POA: Diagnosis not present

## 2021-05-20 DIAGNOSIS — Z7689 Persons encountering health services in other specified circumstances: Secondary | ICD-10-CM | POA: Diagnosis not present

## 2021-05-20 DIAGNOSIS — Z978 Presence of other specified devices: Secondary | ICD-10-CM | POA: Diagnosis not present

## 2021-05-20 DIAGNOSIS — R2681 Unsteadiness on feet: Secondary | ICD-10-CM | POA: Diagnosis not present

## 2021-05-20 DIAGNOSIS — M79606 Pain in leg, unspecified: Secondary | ICD-10-CM | POA: Diagnosis not present

## 2021-05-20 DIAGNOSIS — Z89611 Acquired absence of right leg above knee: Secondary | ICD-10-CM | POA: Diagnosis not present

## 2021-05-27 DIAGNOSIS — Z89612 Acquired absence of left leg above knee: Secondary | ICD-10-CM | POA: Diagnosis not present

## 2021-05-27 DIAGNOSIS — R29898 Other symptoms and signs involving the musculoskeletal system: Secondary | ICD-10-CM | POA: Diagnosis not present

## 2021-05-27 DIAGNOSIS — Z978 Presence of other specified devices: Secondary | ICD-10-CM | POA: Diagnosis not present

## 2021-05-27 DIAGNOSIS — Z7689 Persons encountering health services in other specified circumstances: Secondary | ICD-10-CM | POA: Diagnosis not present

## 2021-05-27 DIAGNOSIS — M79606 Pain in leg, unspecified: Secondary | ICD-10-CM | POA: Diagnosis not present

## 2021-05-27 DIAGNOSIS — R2681 Unsteadiness on feet: Secondary | ICD-10-CM | POA: Diagnosis not present

## 2021-05-27 DIAGNOSIS — Z89611 Acquired absence of right leg above knee: Secondary | ICD-10-CM | POA: Diagnosis not present

## 2021-05-29 DIAGNOSIS — M79606 Pain in leg, unspecified: Secondary | ICD-10-CM | POA: Diagnosis not present

## 2021-05-29 DIAGNOSIS — Z978 Presence of other specified devices: Secondary | ICD-10-CM | POA: Diagnosis not present

## 2021-05-29 DIAGNOSIS — Z89612 Acquired absence of left leg above knee: Secondary | ICD-10-CM | POA: Diagnosis not present

## 2021-05-29 DIAGNOSIS — Z7689 Persons encountering health services in other specified circumstances: Secondary | ICD-10-CM | POA: Diagnosis not present

## 2021-05-29 DIAGNOSIS — Z89611 Acquired absence of right leg above knee: Secondary | ICD-10-CM | POA: Diagnosis not present

## 2021-05-29 DIAGNOSIS — R2681 Unsteadiness on feet: Secondary | ICD-10-CM | POA: Diagnosis not present

## 2021-05-29 DIAGNOSIS — R29898 Other symptoms and signs involving the musculoskeletal system: Secondary | ICD-10-CM | POA: Diagnosis not present

## 2021-05-31 DIAGNOSIS — S61401D Unspecified open wound of right hand, subsequent encounter: Secondary | ICD-10-CM | POA: Diagnosis not present

## 2021-05-31 DIAGNOSIS — N189 Chronic kidney disease, unspecified: Secondary | ICD-10-CM | POA: Diagnosis not present

## 2021-05-31 DIAGNOSIS — J45901 Unspecified asthma with (acute) exacerbation: Secondary | ICD-10-CM | POA: Diagnosis not present

## 2021-05-31 DIAGNOSIS — Z20822 Contact with and (suspected) exposure to covid-19: Secondary | ICD-10-CM | POA: Diagnosis not present

## 2021-05-31 DIAGNOSIS — M329 Systemic lupus erythematosus, unspecified: Secondary | ICD-10-CM | POA: Diagnosis not present

## 2021-05-31 DIAGNOSIS — X58XXXA Exposure to other specified factors, initial encounter: Secondary | ICD-10-CM | POA: Diagnosis not present

## 2021-05-31 DIAGNOSIS — S61401A Unspecified open wound of right hand, initial encounter: Secondary | ICD-10-CM | POA: Diagnosis not present

## 2021-05-31 DIAGNOSIS — R9431 Abnormal electrocardiogram [ECG] [EKG]: Secondary | ICD-10-CM | POA: Diagnosis not present

## 2021-05-31 DIAGNOSIS — Z7951 Long term (current) use of inhaled steroids: Secondary | ICD-10-CM | POA: Diagnosis not present

## 2021-05-31 DIAGNOSIS — F17200 Nicotine dependence, unspecified, uncomplicated: Secondary | ICD-10-CM | POA: Diagnosis not present

## 2021-05-31 DIAGNOSIS — Z79899 Other long term (current) drug therapy: Secondary | ICD-10-CM | POA: Diagnosis not present

## 2021-05-31 DIAGNOSIS — I1310 Hypertensive heart and chronic kidney disease without heart failure, with stage 1 through stage 4 chronic kidney disease, or unspecified chronic kidney disease: Secondary | ICD-10-CM | POA: Diagnosis not present

## 2021-05-31 DIAGNOSIS — M7989 Other specified soft tissue disorders: Secondary | ICD-10-CM | POA: Diagnosis not present

## 2021-05-31 DIAGNOSIS — R059 Cough, unspecified: Secondary | ICD-10-CM | POA: Diagnosis not present

## 2021-06-03 DIAGNOSIS — M329 Systemic lupus erythematosus, unspecified: Secondary | ICD-10-CM | POA: Diagnosis not present

## 2021-06-03 DIAGNOSIS — Z1389 Encounter for screening for other disorder: Secondary | ICD-10-CM | POA: Diagnosis not present

## 2021-06-03 DIAGNOSIS — Z7689 Persons encountering health services in other specified circumstances: Secondary | ICD-10-CM | POA: Diagnosis not present

## 2021-06-03 DIAGNOSIS — Z0001 Encounter for general adult medical examination with abnormal findings: Secondary | ICD-10-CM | POA: Diagnosis not present

## 2021-06-03 DIAGNOSIS — I7300 Raynaud's syndrome without gangrene: Secondary | ICD-10-CM | POA: Diagnosis not present

## 2021-06-03 DIAGNOSIS — I10 Essential (primary) hypertension: Secondary | ICD-10-CM | POA: Diagnosis not present

## 2021-06-04 DIAGNOSIS — Z7689 Persons encountering health services in other specified circumstances: Secondary | ICD-10-CM | POA: Diagnosis not present

## 2021-06-06 DIAGNOSIS — Z89612 Acquired absence of left leg above knee: Secondary | ICD-10-CM | POA: Diagnosis not present

## 2021-06-06 DIAGNOSIS — Z4781 Encounter for orthopedic aftercare following surgical amputation: Secondary | ICD-10-CM | POA: Diagnosis not present

## 2021-06-06 DIAGNOSIS — M6281 Muscle weakness (generalized): Secondary | ICD-10-CM | POA: Diagnosis not present

## 2021-06-10 DIAGNOSIS — R29898 Other symptoms and signs involving the musculoskeletal system: Secondary | ICD-10-CM | POA: Diagnosis not present

## 2021-06-10 DIAGNOSIS — Z7689 Persons encountering health services in other specified circumstances: Secondary | ICD-10-CM | POA: Diagnosis not present

## 2021-06-10 DIAGNOSIS — M79606 Pain in leg, unspecified: Secondary | ICD-10-CM | POA: Diagnosis not present

## 2021-06-10 DIAGNOSIS — R2681 Unsteadiness on feet: Secondary | ICD-10-CM | POA: Diagnosis not present

## 2021-06-10 DIAGNOSIS — Z89611 Acquired absence of right leg above knee: Secondary | ICD-10-CM | POA: Diagnosis not present

## 2021-06-10 DIAGNOSIS — Z89612 Acquired absence of left leg above knee: Secondary | ICD-10-CM | POA: Diagnosis not present

## 2021-06-10 DIAGNOSIS — Z978 Presence of other specified devices: Secondary | ICD-10-CM | POA: Diagnosis not present

## 2021-06-12 DIAGNOSIS — Z978 Presence of other specified devices: Secondary | ICD-10-CM | POA: Diagnosis not present

## 2021-06-12 DIAGNOSIS — Z7689 Persons encountering health services in other specified circumstances: Secondary | ICD-10-CM | POA: Diagnosis not present

## 2021-06-12 DIAGNOSIS — Z89611 Acquired absence of right leg above knee: Secondary | ICD-10-CM | POA: Diagnosis not present

## 2021-06-12 DIAGNOSIS — Z89612 Acquired absence of left leg above knee: Secondary | ICD-10-CM | POA: Diagnosis not present

## 2021-06-12 DIAGNOSIS — R2681 Unsteadiness on feet: Secondary | ICD-10-CM | POA: Diagnosis not present

## 2021-06-12 DIAGNOSIS — M79606 Pain in leg, unspecified: Secondary | ICD-10-CM | POA: Diagnosis not present

## 2021-06-12 DIAGNOSIS — R29898 Other symptoms and signs involving the musculoskeletal system: Secondary | ICD-10-CM | POA: Diagnosis not present

## 2021-06-17 DIAGNOSIS — I7301 Raynaud's syndrome with gangrene: Secondary | ICD-10-CM | POA: Diagnosis not present

## 2021-06-17 DIAGNOSIS — I742 Embolism and thrombosis of arteries of the upper extremities: Secondary | ICD-10-CM | POA: Diagnosis not present

## 2021-06-17 DIAGNOSIS — S61401D Unspecified open wound of right hand, subsequent encounter: Secondary | ICD-10-CM | POA: Diagnosis not present

## 2021-06-17 DIAGNOSIS — Z7689 Persons encountering health services in other specified circumstances: Secondary | ICD-10-CM | POA: Diagnosis not present

## 2021-06-17 DIAGNOSIS — L98492 Non-pressure chronic ulcer of skin of other sites with fat layer exposed: Secondary | ICD-10-CM | POA: Diagnosis not present

## 2021-06-17 DIAGNOSIS — I739 Peripheral vascular disease, unspecified: Secondary | ICD-10-CM | POA: Diagnosis not present

## 2021-06-18 DIAGNOSIS — Z0001 Encounter for general adult medical examination with abnormal findings: Secondary | ICD-10-CM | POA: Diagnosis not present

## 2021-06-18 DIAGNOSIS — I10 Essential (primary) hypertension: Secondary | ICD-10-CM | POA: Diagnosis not present

## 2021-06-25 DIAGNOSIS — Z79899 Other long term (current) drug therapy: Secondary | ICD-10-CM | POA: Diagnosis not present

## 2021-06-25 DIAGNOSIS — L98491 Non-pressure chronic ulcer of skin of other sites limited to breakdown of skin: Secondary | ICD-10-CM | POA: Diagnosis not present

## 2021-06-25 DIAGNOSIS — M109 Gout, unspecified: Secondary | ICD-10-CM | POA: Diagnosis not present

## 2021-06-25 DIAGNOSIS — I7301 Raynaud's syndrome with gangrene: Secondary | ICD-10-CM | POA: Diagnosis not present

## 2021-06-25 DIAGNOSIS — I739 Peripheral vascular disease, unspecified: Secondary | ICD-10-CM | POA: Diagnosis not present

## 2021-06-25 DIAGNOSIS — R9431 Abnormal electrocardiogram [ECG] [EKG]: Secondary | ICD-10-CM | POA: Diagnosis not present

## 2021-06-25 DIAGNOSIS — Z7689 Persons encountering health services in other specified circumstances: Secondary | ICD-10-CM | POA: Diagnosis not present

## 2021-06-25 DIAGNOSIS — I96 Gangrene, not elsewhere classified: Secondary | ICD-10-CM | POA: Diagnosis not present

## 2021-06-25 DIAGNOSIS — Z89612 Acquired absence of left leg above knee: Secondary | ICD-10-CM | POA: Diagnosis not present

## 2021-06-25 DIAGNOSIS — K219 Gastro-esophageal reflux disease without esophagitis: Secondary | ICD-10-CM | POA: Diagnosis not present

## 2021-06-25 DIAGNOSIS — L98492 Non-pressure chronic ulcer of skin of other sites with fat layer exposed: Secondary | ICD-10-CM | POA: Diagnosis not present

## 2021-06-25 DIAGNOSIS — L98499 Non-pressure chronic ulcer of skin of other sites with unspecified severity: Secondary | ICD-10-CM | POA: Diagnosis not present

## 2021-06-25 DIAGNOSIS — S61401D Unspecified open wound of right hand, subsequent encounter: Secondary | ICD-10-CM | POA: Diagnosis not present

## 2021-06-25 DIAGNOSIS — M349 Systemic sclerosis, unspecified: Secondary | ICD-10-CM | POA: Diagnosis not present

## 2021-06-25 DIAGNOSIS — L905 Scar conditions and fibrosis of skin: Secondary | ICD-10-CM | POA: Diagnosis not present

## 2021-06-25 DIAGNOSIS — I742 Embolism and thrombosis of arteries of the upper extremities: Secondary | ICD-10-CM | POA: Diagnosis not present

## 2021-06-25 DIAGNOSIS — I10 Essential (primary) hypertension: Secondary | ICD-10-CM | POA: Diagnosis not present

## 2021-06-25 DIAGNOSIS — F17210 Nicotine dependence, cigarettes, uncomplicated: Secondary | ICD-10-CM | POA: Diagnosis not present

## 2021-06-25 DIAGNOSIS — I252 Old myocardial infarction: Secondary | ICD-10-CM | POA: Diagnosis not present

## 2021-06-25 DIAGNOSIS — M3214 Glomerular disease in systemic lupus erythematosus: Secondary | ICD-10-CM | POA: Diagnosis not present

## 2021-06-25 DIAGNOSIS — L928 Other granulomatous disorders of the skin and subcutaneous tissue: Secondary | ICD-10-CM | POA: Diagnosis not present

## 2021-07-03 DIAGNOSIS — R269 Unspecified abnormalities of gait and mobility: Secondary | ICD-10-CM | POA: Diagnosis not present

## 2021-07-03 DIAGNOSIS — I10 Essential (primary) hypertension: Secondary | ICD-10-CM | POA: Diagnosis not present

## 2021-07-06 DIAGNOSIS — M6281 Muscle weakness (generalized): Secondary | ICD-10-CM | POA: Diagnosis not present

## 2021-07-06 DIAGNOSIS — Z89612 Acquired absence of left leg above knee: Secondary | ICD-10-CM | POA: Diagnosis not present

## 2021-07-06 DIAGNOSIS — Z4781 Encounter for orthopedic aftercare following surgical amputation: Secondary | ICD-10-CM | POA: Diagnosis not present

## 2021-07-09 DIAGNOSIS — Z7689 Persons encountering health services in other specified circumstances: Secondary | ICD-10-CM | POA: Diagnosis not present

## 2021-07-23 DIAGNOSIS — Z7689 Persons encountering health services in other specified circumstances: Secondary | ICD-10-CM | POA: Diagnosis not present

## 2021-08-03 DIAGNOSIS — I739 Peripheral vascular disease, unspecified: Secondary | ICD-10-CM | POA: Diagnosis not present

## 2021-08-03 DIAGNOSIS — I10 Essential (primary) hypertension: Secondary | ICD-10-CM | POA: Diagnosis not present

## 2021-08-12 DIAGNOSIS — Z96659 Presence of unspecified artificial knee joint: Secondary | ICD-10-CM | POA: Diagnosis not present

## 2021-08-12 DIAGNOSIS — M329 Systemic lupus erythematosus, unspecified: Secondary | ICD-10-CM | POA: Diagnosis not present

## 2021-08-12 DIAGNOSIS — I7300 Raynaud's syndrome without gangrene: Secondary | ICD-10-CM | POA: Diagnosis not present

## 2021-08-12 DIAGNOSIS — Z89029 Acquired absence of unspecified finger(s): Secondary | ICD-10-CM | POA: Diagnosis not present

## 2021-08-12 DIAGNOSIS — I739 Peripheral vascular disease, unspecified: Secondary | ICD-10-CM | POA: Diagnosis not present

## 2021-08-16 DIAGNOSIS — Z79899 Other long term (current) drug therapy: Secondary | ICD-10-CM | POA: Diagnosis not present

## 2021-08-16 DIAGNOSIS — Z5189 Encounter for other specified aftercare: Secondary | ICD-10-CM | POA: Diagnosis not present

## 2021-08-16 DIAGNOSIS — Z4802 Encounter for removal of sutures: Secondary | ICD-10-CM | POA: Diagnosis not present

## 2021-08-16 DIAGNOSIS — I10 Essential (primary) hypertension: Secondary | ICD-10-CM | POA: Diagnosis not present

## 2021-09-02 DIAGNOSIS — I10 Essential (primary) hypertension: Secondary | ICD-10-CM | POA: Diagnosis not present

## 2021-09-02 DIAGNOSIS — I739 Peripheral vascular disease, unspecified: Secondary | ICD-10-CM | POA: Diagnosis not present

## 2021-09-07 DIAGNOSIS — L98494 Non-pressure chronic ulcer of skin of other sites with necrosis of bone: Secondary | ICD-10-CM | POA: Diagnosis not present

## 2021-09-07 DIAGNOSIS — M329 Systemic lupus erythematosus, unspecified: Secondary | ICD-10-CM | POA: Diagnosis not present

## 2021-09-07 DIAGNOSIS — Z89021 Acquired absence of right finger(s): Secondary | ICD-10-CM | POA: Diagnosis not present

## 2021-09-07 DIAGNOSIS — Z9889 Other specified postprocedural states: Secondary | ICD-10-CM | POA: Diagnosis not present

## 2021-09-07 DIAGNOSIS — Z7689 Persons encountering health services in other specified circumstances: Secondary | ICD-10-CM | POA: Diagnosis not present

## 2021-09-07 DIAGNOSIS — S61401D Unspecified open wound of right hand, subsequent encounter: Secondary | ICD-10-CM | POA: Diagnosis not present

## 2021-09-07 DIAGNOSIS — M19041 Primary osteoarthritis, right hand: Secondary | ICD-10-CM | POA: Diagnosis not present

## 2021-09-07 DIAGNOSIS — M1811 Unilateral primary osteoarthritis of first carpometacarpal joint, right hand: Secondary | ICD-10-CM | POA: Diagnosis not present

## 2021-09-24 DIAGNOSIS — Z7689 Persons encountering health services in other specified circumstances: Secondary | ICD-10-CM | POA: Diagnosis not present

## 2021-09-29 DIAGNOSIS — I7300 Raynaud's syndrome without gangrene: Secondary | ICD-10-CM | POA: Diagnosis not present

## 2021-09-29 DIAGNOSIS — Z89029 Acquired absence of unspecified finger(s): Secondary | ICD-10-CM | POA: Diagnosis not present

## 2021-09-29 DIAGNOSIS — Z96659 Presence of unspecified artificial knee joint: Secondary | ICD-10-CM | POA: Diagnosis not present

## 2021-09-29 DIAGNOSIS — I739 Peripheral vascular disease, unspecified: Secondary | ICD-10-CM | POA: Diagnosis not present

## 2021-09-29 DIAGNOSIS — M329 Systemic lupus erythematosus, unspecified: Secondary | ICD-10-CM | POA: Diagnosis not present

## 2021-10-03 DIAGNOSIS — M329 Systemic lupus erythematosus, unspecified: Secondary | ICD-10-CM | POA: Diagnosis not present

## 2021-10-03 DIAGNOSIS — I10 Essential (primary) hypertension: Secondary | ICD-10-CM | POA: Diagnosis not present

## 2021-10-08 DIAGNOSIS — L98494 Non-pressure chronic ulcer of skin of other sites with necrosis of bone: Secondary | ICD-10-CM | POA: Diagnosis not present

## 2021-10-08 DIAGNOSIS — M329 Systemic lupus erythematosus, unspecified: Secondary | ICD-10-CM | POA: Diagnosis not present

## 2021-10-08 DIAGNOSIS — L98498 Non-pressure chronic ulcer of skin of other sites with other specified severity: Secondary | ICD-10-CM | POA: Diagnosis not present

## 2021-10-08 DIAGNOSIS — L858 Other specified epidermal thickening: Secondary | ICD-10-CM | POA: Diagnosis not present

## 2021-10-08 DIAGNOSIS — M349 Systemic sclerosis, unspecified: Secondary | ICD-10-CM | POA: Diagnosis not present

## 2021-10-08 DIAGNOSIS — L02511 Cutaneous abscess of right hand: Secondary | ICD-10-CM | POA: Diagnosis not present

## 2021-10-08 DIAGNOSIS — Z89021 Acquired absence of right finger(s): Secondary | ICD-10-CM | POA: Diagnosis not present

## 2021-10-08 DIAGNOSIS — S61401D Unspecified open wound of right hand, subsequent encounter: Secondary | ICD-10-CM | POA: Diagnosis not present

## 2021-10-08 DIAGNOSIS — M86641 Other chronic osteomyelitis, right hand: Secondary | ICD-10-CM | POA: Diagnosis not present

## 2021-10-08 DIAGNOSIS — I998 Other disorder of circulatory system: Secondary | ICD-10-CM | POA: Diagnosis not present

## 2021-10-08 DIAGNOSIS — S61401A Unspecified open wound of right hand, initial encounter: Secondary | ICD-10-CM | POA: Diagnosis not present

## 2021-10-21 DIAGNOSIS — L98499 Non-pressure chronic ulcer of skin of other sites with unspecified severity: Secondary | ICD-10-CM | POA: Diagnosis not present

## 2021-10-21 DIAGNOSIS — I7301 Raynaud's syndrome with gangrene: Secondary | ICD-10-CM | POA: Diagnosis not present

## 2021-10-21 DIAGNOSIS — Z4802 Encounter for removal of sutures: Secondary | ICD-10-CM | POA: Diagnosis not present

## 2021-11-03 DIAGNOSIS — G4700 Insomnia, unspecified: Secondary | ICD-10-CM | POA: Diagnosis not present

## 2021-11-03 DIAGNOSIS — I10 Essential (primary) hypertension: Secondary | ICD-10-CM | POA: Diagnosis not present

## 2021-11-03 DIAGNOSIS — R269 Unspecified abnormalities of gait and mobility: Secondary | ICD-10-CM | POA: Diagnosis not present

## 2021-11-04 DIAGNOSIS — M329 Systemic lupus erythematosus, unspecified: Secondary | ICD-10-CM | POA: Diagnosis not present

## 2021-11-04 DIAGNOSIS — I96 Gangrene, not elsewhere classified: Secondary | ICD-10-CM | POA: Diagnosis not present

## 2021-12-03 DIAGNOSIS — L98499 Non-pressure chronic ulcer of skin of other sites with unspecified severity: Secondary | ICD-10-CM | POA: Diagnosis not present

## 2021-12-03 DIAGNOSIS — Z96652 Presence of left artificial knee joint: Secondary | ICD-10-CM | POA: Diagnosis not present

## 2021-12-03 DIAGNOSIS — I10 Essential (primary) hypertension: Secondary | ICD-10-CM | POA: Diagnosis not present

## 2021-12-16 DIAGNOSIS — I252 Old myocardial infarction: Secondary | ICD-10-CM | POA: Diagnosis not present

## 2021-12-16 DIAGNOSIS — R9431 Abnormal electrocardiogram [ECG] [EKG]: Secondary | ICD-10-CM | POA: Diagnosis not present

## 2021-12-16 DIAGNOSIS — K219 Gastro-esophageal reflux disease without esophagitis: Secondary | ICD-10-CM | POA: Diagnosis not present

## 2021-12-16 DIAGNOSIS — Z87891 Personal history of nicotine dependence: Secondary | ICD-10-CM | POA: Diagnosis not present

## 2021-12-16 DIAGNOSIS — I10 Essential (primary) hypertension: Secondary | ICD-10-CM | POA: Diagnosis not present

## 2021-12-16 DIAGNOSIS — Z720 Tobacco use: Secondary | ICD-10-CM | POA: Diagnosis not present

## 2021-12-16 DIAGNOSIS — Z79899 Other long term (current) drug therapy: Secondary | ICD-10-CM | POA: Diagnosis not present

## 2021-12-16 DIAGNOSIS — M3214 Glomerular disease in systemic lupus erythematosus: Secondary | ICD-10-CM | POA: Diagnosis not present

## 2021-12-16 DIAGNOSIS — I96 Gangrene, not elsewhere classified: Secondary | ICD-10-CM | POA: Diagnosis not present

## 2021-12-16 DIAGNOSIS — I2510 Atherosclerotic heart disease of native coronary artery without angina pectoris: Secondary | ICD-10-CM | POA: Diagnosis not present

## 2021-12-16 DIAGNOSIS — I742 Embolism and thrombosis of arteries of the upper extremities: Secondary | ICD-10-CM | POA: Diagnosis not present

## 2021-12-16 DIAGNOSIS — Z7982 Long term (current) use of aspirin: Secondary | ICD-10-CM | POA: Diagnosis not present

## 2021-12-17 DIAGNOSIS — M3214 Glomerular disease in systemic lupus erythematosus: Secondary | ICD-10-CM | POA: Diagnosis not present

## 2021-12-17 DIAGNOSIS — I2510 Atherosclerotic heart disease of native coronary artery without angina pectoris: Secondary | ICD-10-CM | POA: Diagnosis not present

## 2021-12-17 DIAGNOSIS — I96 Gangrene, not elsewhere classified: Secondary | ICD-10-CM | POA: Diagnosis not present

## 2021-12-17 DIAGNOSIS — K219 Gastro-esophageal reflux disease without esophagitis: Secondary | ICD-10-CM | POA: Diagnosis not present

## 2021-12-17 DIAGNOSIS — L98499 Non-pressure chronic ulcer of skin of other sites with unspecified severity: Secondary | ICD-10-CM | POA: Diagnosis not present

## 2021-12-17 DIAGNOSIS — M86142 Other acute osteomyelitis, left hand: Secondary | ICD-10-CM | POA: Diagnosis not present

## 2021-12-17 DIAGNOSIS — Z79899 Other long term (current) drug therapy: Secondary | ICD-10-CM | POA: Diagnosis not present

## 2021-12-17 DIAGNOSIS — Z89022 Acquired absence of left finger(s): Secondary | ICD-10-CM | POA: Diagnosis not present

## 2021-12-17 DIAGNOSIS — I252 Old myocardial infarction: Secondary | ICD-10-CM | POA: Diagnosis not present

## 2021-12-17 DIAGNOSIS — I10 Essential (primary) hypertension: Secondary | ICD-10-CM | POA: Diagnosis not present

## 2021-12-17 DIAGNOSIS — Z87891 Personal history of nicotine dependence: Secondary | ICD-10-CM | POA: Diagnosis not present

## 2021-12-17 DIAGNOSIS — I742 Embolism and thrombosis of arteries of the upper extremities: Secondary | ICD-10-CM | POA: Diagnosis not present

## 2022-01-03 DIAGNOSIS — Z96652 Presence of left artificial knee joint: Secondary | ICD-10-CM | POA: Diagnosis not present

## 2022-01-03 DIAGNOSIS — I10 Essential (primary) hypertension: Secondary | ICD-10-CM | POA: Diagnosis not present

## 2022-01-04 DIAGNOSIS — Z9889 Other specified postprocedural states: Secondary | ICD-10-CM | POA: Diagnosis not present

## 2022-01-04 DIAGNOSIS — Z89022 Acquired absence of left finger(s): Secondary | ICD-10-CM | POA: Diagnosis not present

## 2022-01-27 DIAGNOSIS — Z4789 Encounter for other orthopedic aftercare: Secondary | ICD-10-CM | POA: Diagnosis not present

## 2022-01-28 DIAGNOSIS — I10 Essential (primary) hypertension: Secondary | ICD-10-CM | POA: Diagnosis not present

## 2022-01-28 DIAGNOSIS — G4700 Insomnia, unspecified: Secondary | ICD-10-CM | POA: Diagnosis not present

## 2022-01-28 DIAGNOSIS — M329 Systemic lupus erythematosus, unspecified: Secondary | ICD-10-CM | POA: Diagnosis not present

## 2022-01-28 DIAGNOSIS — Z23 Encounter for immunization: Secondary | ICD-10-CM | POA: Diagnosis not present

## 2022-01-28 DIAGNOSIS — I7300 Raynaud's syndrome without gangrene: Secondary | ICD-10-CM | POA: Diagnosis not present

## 2022-02-27 DIAGNOSIS — I10 Essential (primary) hypertension: Secondary | ICD-10-CM | POA: Diagnosis not present

## 2022-02-27 DIAGNOSIS — Z89029 Acquired absence of unspecified finger(s): Secondary | ICD-10-CM | POA: Diagnosis not present

## 2022-02-28 DIAGNOSIS — I10 Essential (primary) hypertension: Secondary | ICD-10-CM | POA: Diagnosis not present

## 2022-02-28 DIAGNOSIS — Z89612 Acquired absence of left leg above knee: Secondary | ICD-10-CM | POA: Diagnosis not present

## 2022-03-30 DIAGNOSIS — G4700 Insomnia, unspecified: Secondary | ICD-10-CM | POA: Diagnosis not present

## 2022-03-30 DIAGNOSIS — I7300 Raynaud's syndrome without gangrene: Secondary | ICD-10-CM | POA: Diagnosis not present

## 2022-03-30 DIAGNOSIS — I739 Peripheral vascular disease, unspecified: Secondary | ICD-10-CM | POA: Diagnosis not present

## 2022-03-30 DIAGNOSIS — I10 Essential (primary) hypertension: Secondary | ICD-10-CM | POA: Diagnosis not present

## 2022-03-30 DIAGNOSIS — M329 Systemic lupus erythematosus, unspecified: Secondary | ICD-10-CM | POA: Diagnosis not present

## 2022-03-30 DIAGNOSIS — Z89612 Acquired absence of left leg above knee: Secondary | ICD-10-CM | POA: Diagnosis not present

## 2022-03-30 DIAGNOSIS — Z89029 Acquired absence of unspecified finger(s): Secondary | ICD-10-CM | POA: Diagnosis not present

## 2022-03-30 DIAGNOSIS — Z1389 Encounter for screening for other disorder: Secondary | ICD-10-CM | POA: Diagnosis not present

## 2022-03-30 DIAGNOSIS — Z0001 Encounter for general adult medical examination with abnormal findings: Secondary | ICD-10-CM | POA: Diagnosis not present

## 2022-04-14 DIAGNOSIS — L600 Ingrowing nail: Secondary | ICD-10-CM | POA: Diagnosis not present

## 2022-04-14 DIAGNOSIS — L03031 Cellulitis of right toe: Secondary | ICD-10-CM | POA: Diagnosis not present

## 2022-04-14 DIAGNOSIS — I7300 Raynaud's syndrome without gangrene: Secondary | ICD-10-CM | POA: Diagnosis not present

## 2022-04-16 DIAGNOSIS — Z0001 Encounter for general adult medical examination with abnormal findings: Secondary | ICD-10-CM | POA: Diagnosis not present

## 2022-04-16 DIAGNOSIS — I10 Essential (primary) hypertension: Secondary | ICD-10-CM | POA: Diagnosis not present

## 2022-04-16 DIAGNOSIS — I739 Peripheral vascular disease, unspecified: Secondary | ICD-10-CM | POA: Diagnosis not present

## 2022-04-21 DIAGNOSIS — J45909 Unspecified asthma, uncomplicated: Secondary | ICD-10-CM | POA: Diagnosis not present

## 2022-04-21 DIAGNOSIS — Z79899 Other long term (current) drug therapy: Secondary | ICD-10-CM | POA: Diagnosis not present

## 2022-04-21 DIAGNOSIS — K6289 Other specified diseases of anus and rectum: Secondary | ICD-10-CM | POA: Diagnosis not present

## 2022-04-21 DIAGNOSIS — E7800 Pure hypercholesterolemia, unspecified: Secondary | ICD-10-CM | POA: Diagnosis not present

## 2022-04-21 DIAGNOSIS — N189 Chronic kidney disease, unspecified: Secondary | ICD-10-CM | POA: Diagnosis not present

## 2022-04-21 DIAGNOSIS — K760 Fatty (change of) liver, not elsewhere classified: Secondary | ICD-10-CM | POA: Diagnosis not present

## 2022-04-21 DIAGNOSIS — I1310 Hypertensive heart and chronic kidney disease without heart failure, with stage 1 through stage 4 chronic kidney disease, or unspecified chronic kidney disease: Secondary | ICD-10-CM | POA: Diagnosis not present

## 2022-04-21 DIAGNOSIS — M329 Systemic lupus erythematosus, unspecified: Secondary | ICD-10-CM | POA: Diagnosis not present

## 2022-04-21 DIAGNOSIS — F17290 Nicotine dependence, other tobacco product, uncomplicated: Secondary | ICD-10-CM | POA: Diagnosis not present

## 2022-04-21 DIAGNOSIS — K625 Hemorrhage of anus and rectum: Secondary | ICD-10-CM | POA: Diagnosis not present

## 2022-04-21 DIAGNOSIS — R1084 Generalized abdominal pain: Secondary | ICD-10-CM | POA: Diagnosis not present

## 2022-04-22 DIAGNOSIS — Z7982 Long term (current) use of aspirin: Secondary | ICD-10-CM | POA: Diagnosis not present

## 2022-04-22 DIAGNOSIS — M329 Systemic lupus erythematosus, unspecified: Secondary | ICD-10-CM | POA: Diagnosis not present

## 2022-04-22 DIAGNOSIS — I10 Essential (primary) hypertension: Secondary | ICD-10-CM | POA: Diagnosis not present

## 2022-04-22 DIAGNOSIS — D649 Anemia, unspecified: Secondary | ICD-10-CM | POA: Diagnosis not present

## 2022-04-22 DIAGNOSIS — K922 Gastrointestinal hemorrhage, unspecified: Secondary | ICD-10-CM | POA: Diagnosis not present

## 2022-04-22 DIAGNOSIS — K449 Diaphragmatic hernia without obstruction or gangrene: Secondary | ICD-10-CM | POA: Diagnosis not present

## 2022-04-22 DIAGNOSIS — Z89022 Acquired absence of left finger(s): Secondary | ICD-10-CM | POA: Diagnosis not present

## 2022-04-22 DIAGNOSIS — K2100 Gastro-esophageal reflux disease with esophagitis, without bleeding: Secondary | ICD-10-CM | POA: Diagnosis not present

## 2022-04-22 DIAGNOSIS — K5730 Diverticulosis of large intestine without perforation or abscess without bleeding: Secondary | ICD-10-CM | POA: Diagnosis not present

## 2022-04-22 DIAGNOSIS — Z89612 Acquired absence of left leg above knee: Secondary | ICD-10-CM | POA: Diagnosis not present

## 2022-04-22 DIAGNOSIS — Z79891 Long term (current) use of opiate analgesic: Secondary | ICD-10-CM | POA: Diagnosis not present

## 2022-04-22 DIAGNOSIS — K2210 Ulcer of esophagus without bleeding: Secondary | ICD-10-CM | POA: Diagnosis not present

## 2022-04-22 DIAGNOSIS — K219 Gastro-esophageal reflux disease without esophagitis: Secondary | ICD-10-CM | POA: Diagnosis not present

## 2022-04-22 DIAGNOSIS — I2510 Atherosclerotic heart disease of native coronary artery without angina pectoris: Secondary | ICD-10-CM | POA: Diagnosis not present

## 2022-04-22 DIAGNOSIS — I252 Old myocardial infarction: Secondary | ICD-10-CM | POA: Diagnosis not present

## 2022-04-22 DIAGNOSIS — K2101 Gastro-esophageal reflux disease with esophagitis, with bleeding: Secondary | ICD-10-CM | POA: Diagnosis not present

## 2022-04-22 DIAGNOSIS — F17210 Nicotine dependence, cigarettes, uncomplicated: Secondary | ICD-10-CM | POA: Diagnosis not present

## 2022-04-22 DIAGNOSIS — K921 Melena: Secondary | ICD-10-CM | POA: Diagnosis not present

## 2022-04-22 DIAGNOSIS — Z79899 Other long term (current) drug therapy: Secondary | ICD-10-CM | POA: Diagnosis not present

## 2022-04-22 DIAGNOSIS — D62 Acute posthemorrhagic anemia: Secondary | ICD-10-CM | POA: Diagnosis not present

## 2022-04-22 DIAGNOSIS — Z89021 Acquired absence of right finger(s): Secondary | ICD-10-CM | POA: Diagnosis not present

## 2022-04-23 DIAGNOSIS — Z7982 Long term (current) use of aspirin: Secondary | ICD-10-CM | POA: Diagnosis not present

## 2022-04-23 DIAGNOSIS — D649 Anemia, unspecified: Secondary | ICD-10-CM | POA: Diagnosis not present

## 2022-04-23 DIAGNOSIS — D123 Benign neoplasm of transverse colon: Secondary | ICD-10-CM | POA: Diagnosis not present

## 2022-04-23 DIAGNOSIS — Z89021 Acquired absence of right finger(s): Secondary | ICD-10-CM | POA: Diagnosis not present

## 2022-04-23 DIAGNOSIS — K449 Diaphragmatic hernia without obstruction or gangrene: Secondary | ICD-10-CM | POA: Diagnosis not present

## 2022-04-23 DIAGNOSIS — K2100 Gastro-esophageal reflux disease with esophagitis, without bleeding: Secondary | ICD-10-CM | POA: Diagnosis not present

## 2022-04-23 DIAGNOSIS — D62 Acute posthemorrhagic anemia: Secondary | ICD-10-CM | POA: Diagnosis not present

## 2022-04-23 DIAGNOSIS — K5730 Diverticulosis of large intestine without perforation or abscess without bleeding: Secondary | ICD-10-CM | POA: Diagnosis not present

## 2022-04-23 DIAGNOSIS — Z79891 Long term (current) use of opiate analgesic: Secondary | ICD-10-CM | POA: Diagnosis not present

## 2022-04-23 DIAGNOSIS — I10 Essential (primary) hypertension: Secondary | ICD-10-CM | POA: Diagnosis not present

## 2022-04-23 DIAGNOSIS — M329 Systemic lupus erythematosus, unspecified: Secondary | ICD-10-CM | POA: Diagnosis not present

## 2022-04-23 DIAGNOSIS — I2510 Atherosclerotic heart disease of native coronary artery without angina pectoris: Secondary | ICD-10-CM | POA: Diagnosis not present

## 2022-04-23 DIAGNOSIS — K2971 Gastritis, unspecified, with bleeding: Secondary | ICD-10-CM | POA: Diagnosis not present

## 2022-04-23 DIAGNOSIS — Z89612 Acquired absence of left leg above knee: Secondary | ICD-10-CM | POA: Diagnosis not present

## 2022-04-23 DIAGNOSIS — K922 Gastrointestinal hemorrhage, unspecified: Secondary | ICD-10-CM | POA: Diagnosis not present

## 2022-04-23 DIAGNOSIS — F17210 Nicotine dependence, cigarettes, uncomplicated: Secondary | ICD-10-CM | POA: Diagnosis not present

## 2022-04-23 DIAGNOSIS — I252 Old myocardial infarction: Secondary | ICD-10-CM | POA: Diagnosis not present

## 2022-04-23 DIAGNOSIS — Z89022 Acquired absence of left finger(s): Secondary | ICD-10-CM | POA: Diagnosis not present

## 2022-04-23 DIAGNOSIS — Z79899 Other long term (current) drug therapy: Secondary | ICD-10-CM | POA: Diagnosis not present

## 2022-04-23 DIAGNOSIS — K2101 Gastro-esophageal reflux disease with esophagitis, with bleeding: Secondary | ICD-10-CM | POA: Diagnosis not present

## 2022-04-24 DIAGNOSIS — K2101 Gastro-esophageal reflux disease with esophagitis, with bleeding: Secondary | ICD-10-CM | POA: Diagnosis not present

## 2022-04-24 DIAGNOSIS — M329 Systemic lupus erythematosus, unspecified: Secondary | ICD-10-CM | POA: Diagnosis not present

## 2022-04-24 DIAGNOSIS — I10 Essential (primary) hypertension: Secondary | ICD-10-CM | POA: Diagnosis not present

## 2022-04-24 DIAGNOSIS — Z79899 Other long term (current) drug therapy: Secondary | ICD-10-CM | POA: Diagnosis not present

## 2022-04-24 DIAGNOSIS — D509 Iron deficiency anemia, unspecified: Secondary | ICD-10-CM | POA: Diagnosis not present

## 2022-04-24 DIAGNOSIS — K5730 Diverticulosis of large intestine without perforation or abscess without bleeding: Secondary | ICD-10-CM | POA: Diagnosis not present

## 2022-04-24 DIAGNOSIS — D500 Iron deficiency anemia secondary to blood loss (chronic): Secondary | ICD-10-CM | POA: Diagnosis not present

## 2022-04-24 DIAGNOSIS — K208 Other esophagitis without bleeding: Secondary | ICD-10-CM | POA: Diagnosis not present

## 2022-04-24 DIAGNOSIS — Z89612 Acquired absence of left leg above knee: Secondary | ICD-10-CM | POA: Diagnosis not present

## 2022-04-24 DIAGNOSIS — Z89022 Acquired absence of left finger(s): Secondary | ICD-10-CM | POA: Diagnosis not present

## 2022-04-24 DIAGNOSIS — K56699 Other intestinal obstruction unspecified as to partial versus complete obstruction: Secondary | ICD-10-CM | POA: Diagnosis not present

## 2022-04-24 DIAGNOSIS — K449 Diaphragmatic hernia without obstruction or gangrene: Secondary | ICD-10-CM | POA: Diagnosis not present

## 2022-04-24 DIAGNOSIS — D62 Acute posthemorrhagic anemia: Secondary | ICD-10-CM | POA: Diagnosis not present

## 2022-04-24 DIAGNOSIS — K219 Gastro-esophageal reflux disease without esophagitis: Secondary | ICD-10-CM | POA: Diagnosis not present

## 2022-04-24 DIAGNOSIS — K2971 Gastritis, unspecified, with bleeding: Secondary | ICD-10-CM | POA: Diagnosis not present

## 2022-04-24 DIAGNOSIS — F17210 Nicotine dependence, cigarettes, uncomplicated: Secondary | ICD-10-CM | POA: Diagnosis not present

## 2022-04-24 DIAGNOSIS — K56609 Unspecified intestinal obstruction, unspecified as to partial versus complete obstruction: Secondary | ICD-10-CM | POA: Diagnosis not present

## 2022-04-24 DIAGNOSIS — I2510 Atherosclerotic heart disease of native coronary artery without angina pectoris: Secondary | ICD-10-CM | POA: Diagnosis not present

## 2022-04-24 DIAGNOSIS — I252 Old myocardial infarction: Secondary | ICD-10-CM | POA: Diagnosis not present

## 2022-04-24 DIAGNOSIS — Z79891 Long term (current) use of opiate analgesic: Secondary | ICD-10-CM | POA: Diagnosis not present

## 2022-04-24 DIAGNOSIS — F17200 Nicotine dependence, unspecified, uncomplicated: Secondary | ICD-10-CM | POA: Diagnosis not present

## 2022-04-24 DIAGNOSIS — D123 Benign neoplasm of transverse colon: Secondary | ICD-10-CM | POA: Diagnosis not present

## 2022-04-24 DIAGNOSIS — Z89021 Acquired absence of right finger(s): Secondary | ICD-10-CM | POA: Diagnosis not present

## 2022-05-03 DIAGNOSIS — M79674 Pain in right toe(s): Secondary | ICD-10-CM | POA: Diagnosis not present

## 2022-05-03 DIAGNOSIS — L600 Ingrowing nail: Secondary | ICD-10-CM | POA: Diagnosis not present

## 2022-05-03 DIAGNOSIS — L03031 Cellulitis of right toe: Secondary | ICD-10-CM | POA: Diagnosis not present

## 2022-05-03 DIAGNOSIS — M79671 Pain in right foot: Secondary | ICD-10-CM | POA: Diagnosis not present

## 2022-05-06 DIAGNOSIS — I739 Peripheral vascular disease, unspecified: Secondary | ICD-10-CM | POA: Diagnosis not present

## 2022-05-06 DIAGNOSIS — Z89029 Acquired absence of unspecified finger(s): Secondary | ICD-10-CM | POA: Diagnosis not present

## 2022-05-06 DIAGNOSIS — Z96659 Presence of unspecified artificial knee joint: Secondary | ICD-10-CM | POA: Diagnosis not present

## 2022-05-06 DIAGNOSIS — M329 Systemic lupus erythematosus, unspecified: Secondary | ICD-10-CM | POA: Diagnosis not present

## 2022-05-06 DIAGNOSIS — I7300 Raynaud's syndrome without gangrene: Secondary | ICD-10-CM | POA: Diagnosis not present

## 2022-05-13 DIAGNOSIS — I10 Essential (primary) hypertension: Secondary | ICD-10-CM | POA: Diagnosis not present

## 2022-05-13 DIAGNOSIS — Z89612 Acquired absence of left leg above knee: Secondary | ICD-10-CM | POA: Diagnosis not present

## 2022-05-13 IMAGING — DX DG KNEE 1-2V PORT*L*
2 series · 2 of 2 positions shown · non-contrast
Comparison: September 26, 2019.

CLINICAL DATA: Status post left total knee arthroplasty.

EXAM:
PORTABLE LEFT KNEE - 1-2 VIEW

[knee ap]
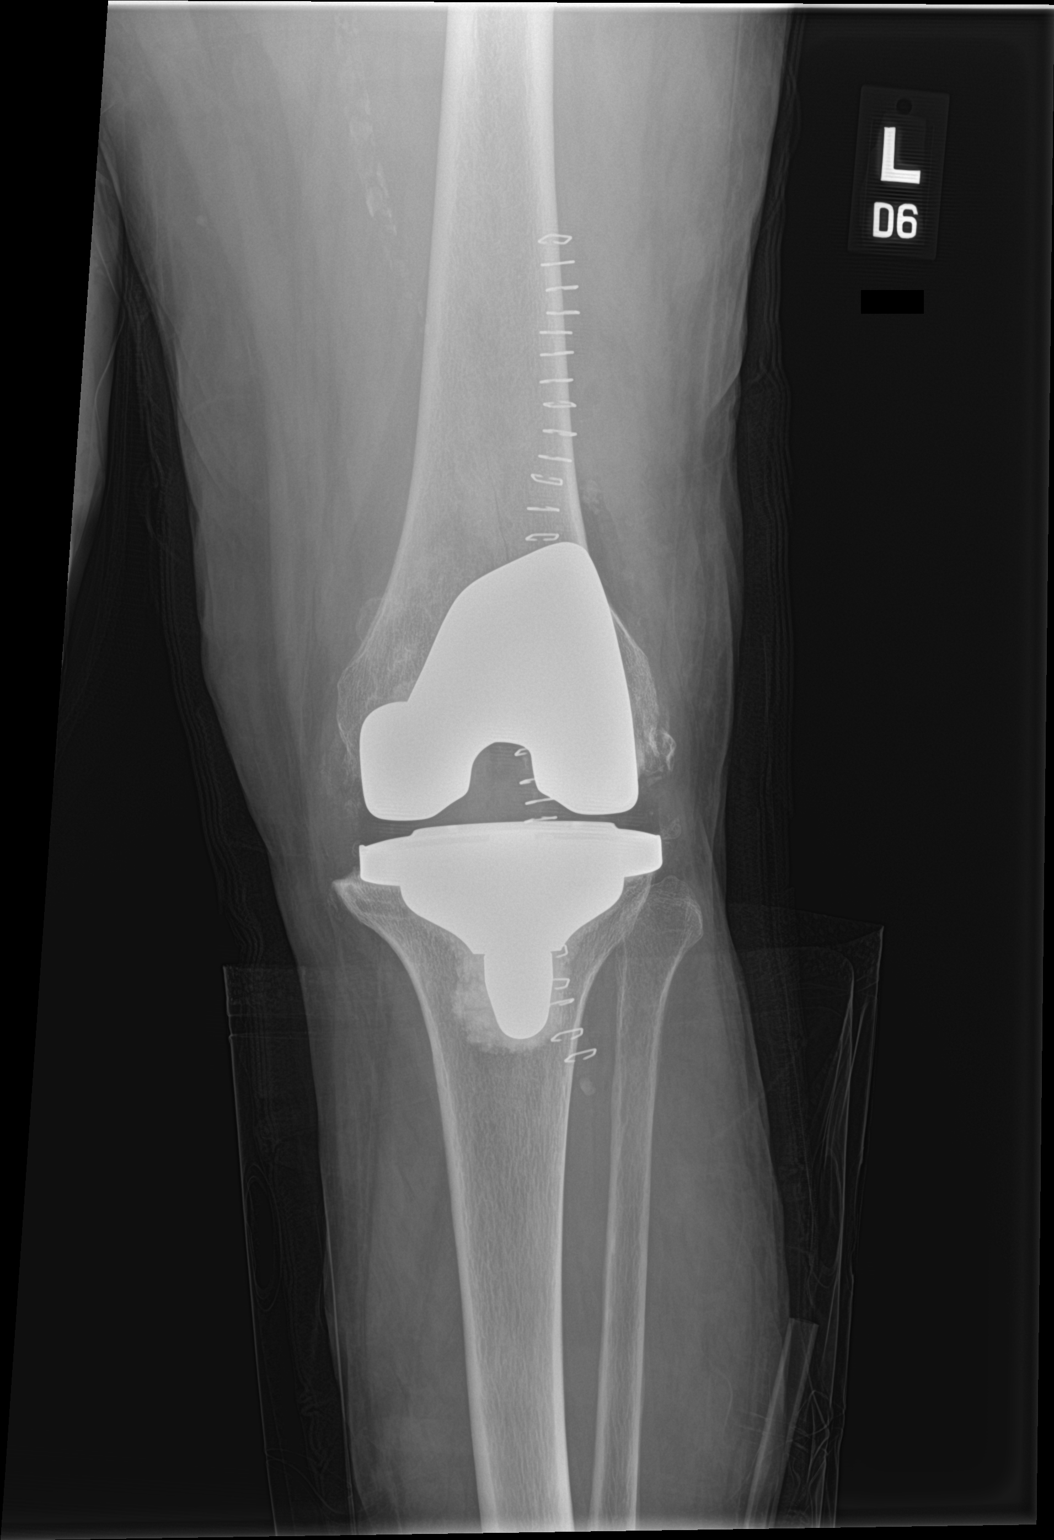

[knee lat]
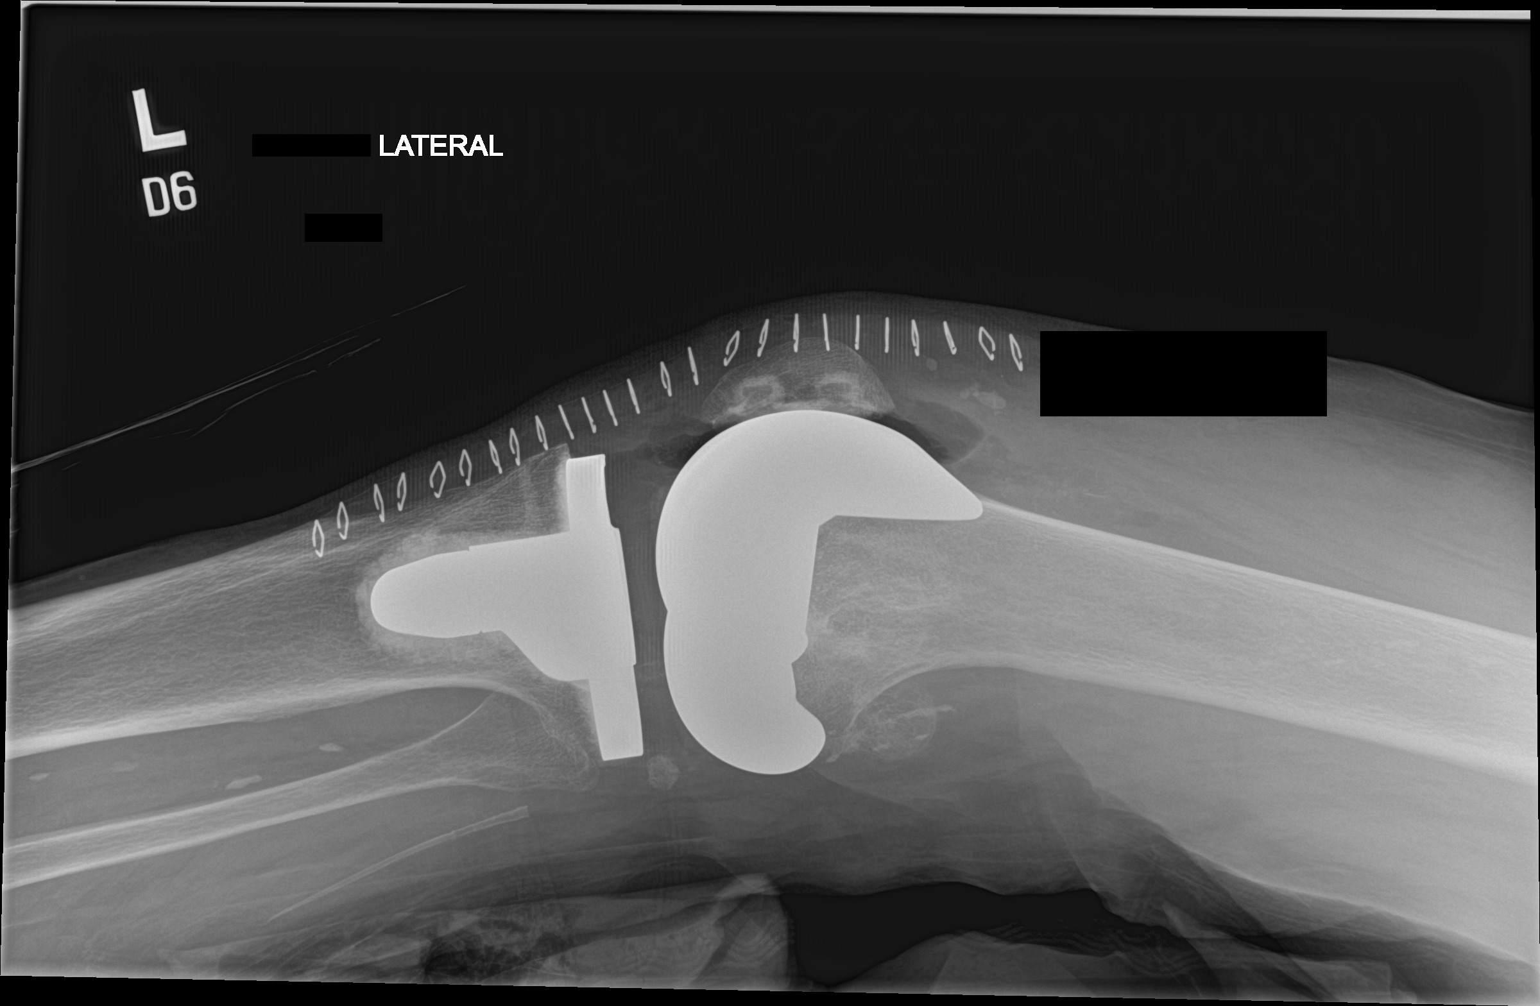

[2 of 2 positions shown; findings below may reference images not displayed]

FINDINGS: The left femoral and tibial components are well situated. Expected
postoperative changes are noted in the soft tissues anteriorly.
IMPRESSION: Status post left total knee arthroplasty.

## 2022-05-18 DIAGNOSIS — M79671 Pain in right foot: Secondary | ICD-10-CM | POA: Diagnosis not present

## 2022-05-18 DIAGNOSIS — M79674 Pain in right toe(s): Secondary | ICD-10-CM | POA: Diagnosis not present

## 2022-05-18 DIAGNOSIS — L03031 Cellulitis of right toe: Secondary | ICD-10-CM | POA: Diagnosis not present

## 2022-05-18 DIAGNOSIS — I739 Peripheral vascular disease, unspecified: Secondary | ICD-10-CM | POA: Diagnosis not present

## 2022-06-13 DIAGNOSIS — I10 Essential (primary) hypertension: Secondary | ICD-10-CM | POA: Diagnosis not present

## 2022-06-13 DIAGNOSIS — Z89612 Acquired absence of left leg above knee: Secondary | ICD-10-CM | POA: Diagnosis not present

## 2022-06-19 DIAGNOSIS — M79674 Pain in right toe(s): Secondary | ICD-10-CM | POA: Diagnosis not present

## 2022-06-19 DIAGNOSIS — M869 Osteomyelitis, unspecified: Secondary | ICD-10-CM | POA: Diagnosis not present

## 2022-06-19 DIAGNOSIS — L03031 Cellulitis of right toe: Secondary | ICD-10-CM | POA: Diagnosis not present

## 2022-06-19 DIAGNOSIS — I96 Gangrene, not elsewhere classified: Secondary | ICD-10-CM | POA: Diagnosis not present

## 2022-06-19 DIAGNOSIS — Z791 Long term (current) use of non-steroidal anti-inflammatories (NSAID): Secondary | ICD-10-CM | POA: Diagnosis not present

## 2022-06-19 DIAGNOSIS — Z87448 Personal history of other diseases of urinary system: Secondary | ICD-10-CM | POA: Diagnosis not present

## 2022-06-19 DIAGNOSIS — M329 Systemic lupus erythematosus, unspecified: Secondary | ICD-10-CM | POA: Diagnosis not present

## 2022-06-19 DIAGNOSIS — I10 Essential (primary) hypertension: Secondary | ICD-10-CM | POA: Diagnosis not present

## 2022-06-19 DIAGNOSIS — Z79899 Other long term (current) drug therapy: Secondary | ICD-10-CM | POA: Diagnosis not present

## 2022-06-19 DIAGNOSIS — M349 Systemic sclerosis, unspecified: Secondary | ICD-10-CM | POA: Diagnosis not present

## 2022-06-19 DIAGNOSIS — I739 Peripheral vascular disease, unspecified: Secondary | ICD-10-CM | POA: Diagnosis not present

## 2022-06-19 DIAGNOSIS — Z792 Long term (current) use of antibiotics: Secondary | ICD-10-CM | POA: Diagnosis not present

## 2022-06-19 DIAGNOSIS — M3210 Systemic lupus erythematosus, organ or system involvement unspecified: Secondary | ICD-10-CM | POA: Diagnosis not present

## 2022-06-19 DIAGNOSIS — M7989 Other specified soft tissue disorders: Secondary | ICD-10-CM | POA: Diagnosis not present

## 2022-06-19 DIAGNOSIS — E876 Hypokalemia: Secondary | ICD-10-CM | POA: Diagnosis not present

## 2022-06-19 DIAGNOSIS — M87077 Idiopathic aseptic necrosis of right toe(s): Secondary | ICD-10-CM | POA: Diagnosis not present

## 2022-06-19 DIAGNOSIS — M109 Gout, unspecified: Secondary | ICD-10-CM | POA: Diagnosis not present

## 2022-06-24 NOTE — Progress Notes (Signed)
Office Note     CC:  Right leg critical limb ischemia with tissue loss Requesting Provider:  Annita Brod, MD  HPI: Crystal Patterson is a 57 y.o. (May 24, 1965) female presenting at the request of .Benetta Spar, MD for right great toe wound with nonpalpable pulses.  Exam, Crystal Patterson was sitting in a wheelchair, accompanied by her daughter.  She recently underwent left-sided above-knee amputation with Dr. Lajoyce Corners due to nonsalvageable limb.  This healed nicely.  Surgical history also includes amputation of multiple fingers bilaterally with Dr. Magnus Ivan.  He has carried the diagnosis of lupus for a number of years, with significant arterial involvement.  She notes severe rest pain in the right lower extremity, and states the wound has been present for roughly 1 month.  Crystal Patterson uses the right leg to turn and pivot in an effort to stay somewhat independent.  She is in constant pain, and asked for pain medication today.  She denied fevers chills.  Current medications include aspirin, statin, oral antibiotics for right first toe dry gangrene.  The pt is  on a statin for cholesterol management.  The pt is  on a daily aspirin.   Other AC:  - The pt is  on medication for hypertension.   The pt is not diabetic.  Tobacco hx:  daily smoker  Past Medical History:  Diagnosis Date   Anginal pain (HCC) 2020   Arthritis    Knees, hips,   CHF (congestive heart failure) (HCC)    Elevated troponin 05/24/2016   Finger amputation, no complication    GERD (gastroesophageal reflux disease)    Gout    Heart murmur    never has caused any problems per patient 04/02/20   Hypercholesteremia    Hypertension    Infection of total left knee replacement (HCC) 01/25/2020   Lupus (HCC)     Past Surgical History:  Procedure Laterality Date   AMPUTATION Left 04/04/2020   Procedure: LEFT ABOVE KNEE AMPUTATION;  Surgeon: Nadara Mustard, MD;  Location: [facility] OR;  Service: Orthopedics;  Laterality: Left;   CARDIAC  CATHETERIZATION  05/25/2016   COLONOSCOPY N/A 01/17/2019   Procedure: COLONOSCOPY;  Surgeon: Corbin Ade, MD;  [facility];  Service: Endoscopy;  Laterality: N/A;  10:30   EXCISIONAL TOTAL KNEE ARTHROPLASTY Left 02/05/2020   Procedure: REPEAT IRRIGATION AND DEBRIDEMENT LEFT KNEE;  Surgeon: [name], MD;  Location: [facility];  Service: Orthopedics;  Laterality: Left;   I & D EXTREMITY Left 02/08/2020   Procedure: EXCISIONAL DEBRIDEMENT LEFT KNEE;  Surgeon: Nadara Mustard, MD;  Location: [facility] OR;  Service: Orthopedics;  Laterality: Left;   I & D KNEE WITH POLY EXCHANGE Left 01/25/2020   Procedure: INCISION AND DRAINAGE LEFT KNEE WITH POLY-LINER EXCHANGE;  Surgeon: [name], MD;  Location: [facility];  Service: Orthopedics;  Laterality: Left;   IRRIGATION AND DEBRIDEMENT KNEE Left 01/30/2020   Procedure: REPEAT IRRIGATION AND DEBRIDEMENT LEFT KNEE PLACEMENT OF WOUND VAC;  Surgeon: [name], MD;  Location: [facility];  Service: Orthopedics;  Laterality: Left;   left finger amputations     POLYPECTOMY  01/17/2019   Procedure: POLYPECTOMY;  Surgeon: Corbin Ade, MD;  Location: [facility];  Service: Endoscopy;;   right hand surgery     right knee arthroscopy     RIGHT/LEFT HEART CATH AND CORONARY ANGIOGRAPHY N/A 05/25/2016   Procedure: Right/Left Heart Cath and Coronary Angiography;  Surgeon: Lennette Bihari, MD;  Location:  [facility];  Service: Cardiovascular;  Laterality: N/A;   SKIN SPLIT GRAFT Left 02/08/2020   Procedure: SKIN GRAFT SPLIT THICKNESS LEFT KNEE;  Surgeon: Nadara Mustard, MD;  Location: [facility];  Service: Orthopedics;  Laterality: Left;   TOTAL KNEE ARTHROPLASTY Left 01/04/2020   Procedure: LEFT TOTAL KNEE ARTHROPLASTY;  Surgeon: [name], MD;  Location: [facility];  Service: Orthopedics;  Laterality: Left;    Social History   Socioeconomic History   Marital status: Single    Spouse name: Not on file   Number of  children: Not on file   Years of education: Not on file   Highest education level: Not on file  Occupational History   Occupation: unemployed  Tobacco Use   Smoking status: Every Day    Packs/day: 0.10    Years: 15.00    Additional pack years: 0.00    Total pack years: 1.50    Types: Cigarettes   Smokeless tobacco: Never  Vaping Use   Vaping Use: Never used  Substance and Sexual Activity   Alcohol use: Yes    Alcohol/week: 2.0 - 3.0 standard drinks of alcohol    Types: 2 - 3 Cans of beer per week    Comment: 03/18/20   "somedays none"   Drug use: Yes    Types: Marijuana    Comment: Last time 04/02/20   Sexual activity: Not Currently    Birth control/protection: Post-menopausal  Other Topics Concern   Not on file  Social History Narrative   Patient has been living alone; however, plans to discharge home to sister, Crystal Patterson's, house   Social Determinants of Health   Financial Resource Strain: Not on file  Food Insecurity: Not on file  Transportation Needs: Not on file  Physical Activity: Not on file  Stress: Not on file  Social Connections: Not on file  Intimate Partner Violence: Not on file   Family History  Problem Relation Age of Onset   Pancreatic cancer Mother    Colon cancer Father     Current Outpatient Medications  Medication Sig Dispense Refill   allopurinol (ZYLOPRIM) 100 MG tablet Take 1 tablet (100 mg total) by mouth daily. 30 tablet 0   aspirin 81 MG chewable tablet Chew 1 tablet (81 mg total) by mouth 2 (two) times daily. 30 tablet 0   atenolol (TENORMIN) 25 MG tablet Take 1 tablet (25 mg total) by mouth daily. 30 tablet 0   atorvastatin (LIPITOR) 40 MG tablet Take 1 tablet (40 mg total) by mouth daily at 6 PM. 30 tablet 0   benazepril (LOTENSIN) 10 MG tablet Take 1 tablet (10 mg total) by mouth daily. 30 tablet 0   cetaphil (CETAPHIL) lotion Apply 1 application topically 2 (two) times daily.     Ensure (ENSURE) Take 237 mLs by mouth daily.      escitalopram (LEXAPRO) 10 MG tablet Take 1 tablet (10 mg total) by mouth daily. 30 tablet 0   furosemide (LASIX) 40 MG tablet Take 1 tablet (40 mg total) by mouth daily. 30 tablet 0   gabapentin (NEURONTIN) 300 MG capsule Take 1 capsule (300 mg total) by mouth daily. 30 capsule 0   methocarbamol (ROBAXIN) 500 MG tablet Take 1 tablet (500 mg total) by mouth every 8 (eight) hours as needed for muscle spasms. 30 tablet 0   omeprazole (PRILOSEC) 20 MG capsule Take 1 capsule (20 mg total) by mouth daily. 30 capsule 0   oxyCODONE (OXY IR/ROXICODONE) 5 MG  immediate release tablet Take 1 tablet (5 mg total) by mouth every 8 (eight) hours as needed for severe pain. 15 tablet 0   potassium chloride (KLOR-CON) 10 MEQ tablet Take 1 tablet (10 mEq total) by mouth daily. 30 tablet 0   sulfamethoxazole-trimethoprim (BACTRIM DS) 800-160 MG tablet Take 1 tablet by mouth 2 (two) times daily. 20 tablet 0   zolpidem (AMBIEN) 10 MG tablet Take 1 tablet (10 mg total) by mouth at bedtime as needed for sleep. 15 tablet 0   No current facility-administered medications for this visit.    No Known Allergies   REVIEW OF SYSTEMS:  [X]  denotes positive finding, [ ]  denotes negative finding Cardiac  Comments:  Chest pain or chest pressure:    Shortness of breath upon exertion:    Short of breath when lying flat:    Irregular heart rhythm:        Vascular    Pain in calf, thigh, or hip brought on by ambulation:    Pain in feet at night that wakes you up from your sleep:     Blood clot in your veins:    Leg swelling:         Pulmonary    Oxygen at home:    Productive cough:     Wheezing:         Neurologic    Sudden weakness in arms or legs:     Sudden numbness in arms or legs:     Sudden onset of difficulty speaking or slurred speech:    Temporary loss of vision in one eye:     Problems with dizziness:         Gastrointestinal    Blood in stool:     Vomited blood:         Genitourinary    Burning when  urinating:     Blood in urine:        Psychiatric    Major depression:         Hematologic    Bleeding problems:    Problems with blood clotting too easily:        Skin    Rashes or ulcers:        Constitutional    Fever or chills:      PHYSICAL EXAMINATION:  There were no vitals filed for this visit.  General:  WDWN in NAD; vital signs documented above Gait: Not observed HENT: WNL, normocephalic Pulmonary: normal non-labored breathing , without wheezing Cardiac: regular HR Abdomen: soft, NT, no masses Skin: without rashes Vascular Exam/Pulses:  Right Left  Radial 1+ (weak) 1+ (weak)  Ulnar    Femoral    Popliteal    DP Unable to palpate Unable to palpate   PT     Extremities: with ischemic changes, with Gangrene , without cellulitis; with open wounds;  Patient missing multiple fingers on bilateral hands, left AKA, right great toe wound Musculoskeletal: no muscle wasting or atrophy  Neurologic: A&O X 3;  No focal weakness or paresthesias are detected Psychiatric:  The pt has Normal affect.   Non-Invasive Vascular Imaging:         ASSESSMENT/PLAN: MYELLE POTEAT is a 57 y.o. female presenting with right lower extremity critical limb ischemia with tissue loss at the great toe.  On physical exam, she had nonpalpable pulses in the foot.  I was unable to palpate femoral pulses as she was in a wheelchair, and could not make it to the exam table.  ABIs were 0 at the level of the ankle, with no waveform appreciated.  I had a long discussion with Rossy regarding her severe peripheral arterial disease.  In evaluating her ABI, there is multilevel occlusive disease.  In the setting of lupus, I am worried she has burned out all of her tibial vessels.  An honest discussion with her that if I am unable to improve her blood flow, she will likely require an above-knee amputation on the right as well.  Both her and her daughter understood this.  I will do everything possible in an  effort to improve distal perfusion for wound healing and right leg salvage.  Plan for angiogram with possible intervention Wednesday. Lurdes continues to smoke.   Victorino Sparrow, MD [facility] [phone]

## 2022-06-25 ENCOUNTER — Encounter: Payer: Self-pay | Admitting: Vascular Surgery

## 2022-06-25 ENCOUNTER — Ambulatory Visit (INDEPENDENT_AMBULATORY_CARE_PROVIDER_SITE_OTHER): Payer: 59 | Admitting: Vascular Surgery

## 2022-06-25 VITALS — BP 101/71 | HR 84 | Temp 98.3°F | Resp 20 | Ht 64.0 in | Wt 121.0 lb

## 2022-06-25 DIAGNOSIS — I70245 Atherosclerosis of native arteries of left leg with ulceration of other part of foot: Secondary | ICD-10-CM | POA: Diagnosis not present

## 2022-06-25 MED ORDER — OXYCODONE HCL 5 MG PO TABS: 5.0000 mg | ORAL_TABLET | Freq: Three times a day (TID) | ORAL | 0 refills | Status: DC | PRN

## 2022-06-28 ENCOUNTER — Other Ambulatory Visit: Payer: Self-pay

## 2022-06-28 DIAGNOSIS — I70235 Atherosclerosis of native arteries of right leg with ulceration of other part of foot: Secondary | ICD-10-CM

## 2022-06-30 ENCOUNTER — Ambulatory Visit (HOSPITAL_COMMUNITY)
Admission: RE | Admit: 2022-06-30 | Discharge: 2022-06-30 | Disposition: A | Discharge status: Home / Self Care | Payer: 59 | Source: Ambulatory Visit | Attending: Vascular Surgery | Admitting: Vascular Surgery

## 2022-06-30 ENCOUNTER — Other Ambulatory Visit: Payer: Self-pay

## 2022-06-30 ENCOUNTER — Encounter (HOSPITAL_COMMUNITY)
Admission: RE | Disposition: A | Discharge status: Home / Self Care | Payer: Self-pay | Source: Ambulatory Visit | Attending: Vascular Surgery

## 2022-06-30 DIAGNOSIS — Z7982 Long term (current) use of aspirin: Secondary | ICD-10-CM | POA: Insufficient documentation

## 2022-06-30 DIAGNOSIS — I70235 Atherosclerosis of native arteries of right leg with ulceration of other part of foot: Secondary | ICD-10-CM | POA: Insufficient documentation

## 2022-06-30 DIAGNOSIS — F17210 Nicotine dependence, cigarettes, uncomplicated: Secondary | ICD-10-CM | POA: Diagnosis not present

## 2022-06-30 DIAGNOSIS — I110 Hypertensive heart disease with heart failure: Secondary | ICD-10-CM | POA: Insufficient documentation

## 2022-06-30 DIAGNOSIS — Z89612 Acquired absence of left leg above knee: Secondary | ICD-10-CM | POA: Diagnosis not present

## 2022-06-30 DIAGNOSIS — M329 Systemic lupus erythematosus, unspecified: Secondary | ICD-10-CM | POA: Insufficient documentation

## 2022-06-30 DIAGNOSIS — L97519 Non-pressure chronic ulcer of other part of right foot with unspecified severity: Secondary | ICD-10-CM | POA: Insufficient documentation

## 2022-06-30 DIAGNOSIS — Z79899 Other long term (current) drug therapy: Secondary | ICD-10-CM | POA: Insufficient documentation

## 2022-06-30 DIAGNOSIS — I509 Heart failure, unspecified: Secondary | ICD-10-CM | POA: Insufficient documentation

## 2022-06-30 HISTORY — PX: PERIPHERAL VASCULAR INTERVENTION: CATH118257

## 2022-06-30 HISTORY — PX: ABDOMINAL AORTOGRAM W/LOWER EXTREMITY: CATH118223

## 2022-06-30 LAB — POCT I-STAT, CHEM 8
BUN: 3 mg/dL — ABNORMAL LOW (ref 6–20)
Calcium, Ion: 1.09 mmol/L — ABNORMAL LOW (ref 1.15–1.40)
Chloride: 102 mmol/L (ref 98–111)
Creatinine, Ser: 0.7 mg/dL (ref 0.44–1.00)
Glucose, Bld: 74 mg/dL (ref 70–99)
HCT: 29 % — ABNORMAL LOW (ref 36.0–46.0)
Hemoglobin: 9.9 g/dL — ABNORMAL LOW (ref 12.0–15.0)
Potassium: 3.5 mmol/L (ref 3.5–5.1)
Sodium: 142 mmol/L (ref 135–145)
TCO2: 30 mmol/L (ref 22–32)

## 2022-06-30 LAB — POCT ACTIVATED CLOTTING TIME: Activated Clotting Time: 234 seconds

## 2022-06-30 SURGERY — ABDOMINAL AORTOGRAM W/LOWER EXTREMITY
Anesthesia: LOCAL

## 2022-06-30 MED ORDER — MIDAZOLAM HCL 2 MG/2ML IJ SOLN
INTRAMUSCULAR | Status: DC | PRN
  Administered 2022-06-30: 1 mg via INTRAVENOUS

## 2022-06-30 MED ORDER — SODIUM CHLORIDE 0.9 % WEIGHT BASED INFUSION: 1.0000 mL/kg/h | INTRAVENOUS | Status: DC

## 2022-06-30 MED ORDER — OXYCODONE HCL 5 MG PO TABS
5.0000 mg | ORAL_TABLET | ORAL | Status: AC
  Administered 2022-06-30: 5 mg via ORAL
  Filled 2022-06-30: qty 1

## 2022-06-30 MED ORDER — MIDAZOLAM HCL 2 MG/2ML IJ SOLN
INTRAMUSCULAR | Status: AC
  Filled 2022-06-30: qty 2

## 2022-06-30 MED ORDER — FENTANYL CITRATE (PF) 100 MCG/2ML IJ SOLN
INTRAMUSCULAR | Status: DC | PRN
  Administered 2022-06-30 (×2): 50 ug via INTRAVENOUS

## 2022-06-30 MED ORDER — HEPARIN SODIUM (PORCINE) 1000 UNIT/ML IJ SOLN
INTRAMUSCULAR | Status: AC
  Filled 2022-06-30: qty 10

## 2022-06-30 MED ORDER — SODIUM CHLORIDE 0.9% FLUSH: 3.0000 mL | INTRAVENOUS | Status: DC | PRN

## 2022-06-30 MED ORDER — SODIUM CHLORIDE 0.9 % IV SOLN: INTRAVENOUS | Status: DC

## 2022-06-30 MED ORDER — ASPIRIN 81 MG PO TBEC
81.0000 mg | DELAYED_RELEASE_TABLET | Freq: Every day | ORAL | Status: DC
  Administered 2022-06-30: 81 mg via ORAL
  Filled 2022-06-30: qty 1

## 2022-06-30 MED ORDER — SODIUM CHLORIDE 0.9% FLUSH: 3.0000 mL | Freq: Two times a day (BID) | INTRAVENOUS | Status: DC

## 2022-06-30 MED ORDER — CLOPIDOGREL BISULFATE 75 MG PO TABS: 75.0000 mg | ORAL_TABLET | Freq: Every day | ORAL | 11 refills | Status: AC

## 2022-06-30 MED ORDER — LABETALOL HCL 5 MG/ML IV SOLN
10.0000 mg | INTRAVENOUS | Status: DC | PRN
  Administered 2022-06-30: 10 mg via INTRAVENOUS
  Filled 2022-06-30: qty 4

## 2022-06-30 MED ORDER — ACETAMINOPHEN 325 MG PO TABS
650.0000 mg | ORAL_TABLET | ORAL | Status: DC | PRN
  Administered 2022-06-30: 650 mg via ORAL
  Filled 2022-06-30: qty 2

## 2022-06-30 MED ORDER — HEPARIN SODIUM (PORCINE) 1000 UNIT/ML IJ SOLN
INTRAMUSCULAR | Status: DC | PRN
  Administered 2022-06-30: 6000 [IU] via INTRAVENOUS

## 2022-06-30 MED ORDER — LIDOCAINE HCL (PF) 1 % IJ SOLN
INTRAMUSCULAR | Status: AC
  Filled 2022-06-30: qty 30

## 2022-06-30 MED ORDER — HEPARIN (PORCINE) IN NACL 1000-0.9 UT/500ML-% IV SOLN
INTRAVENOUS | Status: DC | PRN
  Administered 2022-06-30 (×2): 500 mL

## 2022-06-30 MED ORDER — ONDANSETRON HCL 4 MG/2ML IJ SOLN: 4.0000 mg | Freq: Four times a day (QID) | INTRAMUSCULAR | Status: DC | PRN

## 2022-06-30 MED ORDER — CLOPIDOGREL BISULFATE 300 MG PO TABS
ORAL_TABLET | ORAL | Status: DC | PRN
  Administered 2022-06-30: 300 mg via ORAL

## 2022-06-30 MED ORDER — CLOPIDOGREL BISULFATE 75 MG PO TABS: 75.0000 mg | ORAL_TABLET | Freq: Every day | ORAL | Status: DC

## 2022-06-30 MED ORDER — FENTANYL CITRATE (PF) 100 MCG/2ML IJ SOLN
INTRAMUSCULAR | Status: AC
  Filled 2022-06-30: qty 2

## 2022-06-30 MED ORDER — LIDOCAINE HCL (PF) 1 % IJ SOLN
INTRAMUSCULAR | Status: DC | PRN
  Administered 2022-06-30: 20 mL via INTRADERMAL

## 2022-06-30 MED ORDER — SODIUM CHLORIDE 0.9 % IV SOLN: 250.0000 mL | INTRAVENOUS | Status: DC | PRN

## 2022-06-30 MED ORDER — CLOPIDOGREL BISULFATE 300 MG PO TABS
ORAL_TABLET | ORAL | Status: AC
  Filled 2022-06-30: qty 1

## 2022-06-30 MED ORDER — HYDRALAZINE HCL 20 MG/ML IJ SOLN: 5.0000 mg | INTRAMUSCULAR | Status: DC | PRN

## 2022-06-30 SURGICAL SUPPLY — 22 items
BALLN MUSTANG 5X120X135 (BALLOONS) ×2
BALLN STERLI SL OTW 2.5X80X150 (BALLOONS) ×2
BALLOON MUSTANG 5X120X135 (BALLOONS) IMPLANT
BALLOON STRLNG OTW 2.5X80X150 (BALLOONS) IMPLANT
CATH OMNI FLUSH 5F 65CM (CATHETERS) IMPLANT
CATH QUICKCROSS .018X135CM (MICROCATHETER) IMPLANT
CATH QUICKCROSS .035X135CM (MICROCATHETER) IMPLANT
CLOSURE PERCLOSE PROSTYLE (VASCULAR PRODUCTS) IMPLANT
GLIDEWIRE ADV .035X260CM (WIRE) IMPLANT
KIT ENCORE 26 ADVANTAGE (KITS) IMPLANT
KIT MICROPUNCTURE NIT STIFF (SHEATH) IMPLANT
KIT PV (KITS) ×3 IMPLANT
SHEATH CATAPULT 6FR 45 (SHEATH) IMPLANT
SHEATH PINNACLE 5F 10CM (SHEATH) IMPLANT
SHEATH PROBE COVER 6X72 (BAG) IMPLANT
STENT ELUVIA 6X150X130 (Permanent Stent) IMPLANT
STOPCOCK MORSE 400PSI 3WAY (MISCELLANEOUS) IMPLANT
SYR MEDRAD MARK 7 150ML (SYRINGE) ×3 IMPLANT
TRANSDUCER W/STOPCOCK (MISCELLANEOUS) ×3 IMPLANT
TRAY PV CATH (CUSTOM PROCEDURE TRAY) ×3 IMPLANT
WIRE G V18X300CM (WIRE) IMPLANT
WIRE STARTER BENTSON 035X150 (WIRE) IMPLANT

## 2022-06-30 NOTE — H&P (Signed)
Office Note   Patient seen and examined in preop holding.  No complaints. No changes to medication history or physical exam since last seen in clinic. After discussing the risks and benefits of right leg angiogram with possible intervention for critical limb ischemia with tissue loss, Crystal Patterson elected to proceed.   Victorino Sparrow MD   CC:  Right leg critical limb ischemia with tissue loss Requesting Provider:  No ref. provider found  HPI: Crystal Patterson is a 57 y.o. (1966/02/22) female presenting at the request of .Benetta Spar, MD for right great toe wound with nonpalpable pulses.  Exam, Crystal Patterson was sitting in a wheelchair, accompanied by her daughter.  She recently underwent left-sided above-knee amputation with Dr. Lajoyce Corners due to nonsalvageable limb.  This healed nicely.  Surgical history also includes amputation of multiple fingers bilaterally with Dr. Magnus Ivan.  He has carried the diagnosis of lupus for a number of years, with significant arterial involvement.  She notes severe rest pain in the right lower extremity, and states the wound has been present for roughly 1 month.  Crystal Patterson uses the right leg to turn and pivot in an effort to stay somewhat independent.  She is in constant pain, and asked for pain medication today.  She denied fevers chills.  Current medications include aspirin, statin, oral antibiotics for right first toe dry gangrene.  The pt is  on a statin for cholesterol management.  The pt is  on a daily aspirin.   Other AC:  - The pt is  on medication for hypertension.   The pt is not diabetic.  Tobacco hx:  daily smoker  Past Medical History:  Diagnosis Date   Anginal pain (HCC) 2020   Arthritis    Knees, hips,   CHF (congestive heart failure) (HCC)    Elevated troponin 05/24/2016   Finger amputation, no complication    GERD (gastroesophageal reflux disease)    Gout    Heart murmur    never has caused any problems per patient 04/02/20    Hypercholesteremia    Hypertension    Infection of total left knee replacement (HCC) 01/25/2020   Lupus (HCC)     Past Surgical History:  Procedure Laterality Date   AMPUTATION Left 04/04/2020   Procedure: LEFT ABOVE KNEE AMPUTATION;  Surgeon: Nadara Mustard, MD;  Location: [facility] OR;  Service: Orthopedics;  Laterality: Left;   CARDIAC CATHETERIZATION  05/25/2016   COLONOSCOPY N/A 01/17/2019   Procedure: COLONOSCOPY;  Surgeon: Corbin Ade, MD;  [facility];  Service: Endoscopy;  Laterality: N/A;  10:30   EXCISIONAL TOTAL KNEE ARTHROPLASTY Left 02/05/2020   Procedure: REPEAT IRRIGATION AND DEBRIDEMENT LEFT KNEE;  Surgeon: [name], MD;  Location: [facility];  Service: Orthopedics;  Laterality: Left;   I & D EXTREMITY Left 02/08/2020   Procedure: EXCISIONAL DEBRIDEMENT LEFT KNEE;  Surgeon: Nadara Mustard, MD;  Location: [facility] OR;  Service: Orthopedics;  Laterality: Left;   I & D KNEE WITH POLY EXCHANGE Left 01/25/2020   Procedure: INCISION AND DRAINAGE LEFT KNEE WITH POLY-LINER EXCHANGE;  Surgeon: [name], MD;  Location: [facility];  Service: Orthopedics;  Laterality: Left;   IRRIGATION AND DEBRIDEMENT KNEE Left 01/30/2020   Procedure: REPEAT IRRIGATION AND DEBRIDEMENT LEFT KNEE PLACEMENT OF WOUND VAC;  Surgeon: [name], MD;  Location: [facility];  Service: Orthopedics;  Laterality: Left;   left finger amputations     POLYPECTOMY  01/17/2019   Procedure:  POLYPECTOMY;  Surgeon: Corbin Ade, MD;  [facility];  Service: Endoscopy;;   right hand surgery     right knee arthroscopy     RIGHT/LEFT HEART CATH AND CORONARY ANGIOGRAPHY N/A 05/25/2016   Procedure: Right/Left Heart Cath and Coronary Angiography;  Surgeon: Lennette Bihari, MD;  Location: [facility];  Service: Cardiovascular;  Laterality: N/A;   SKIN SPLIT GRAFT Left 02/08/2020   Procedure: SKIN GRAFT SPLIT THICKNESS LEFT KNEE;  Surgeon: Nadara Mustard, MD;  Location: [facility] OR;   Service: Orthopedics;  Laterality: Left;   TOTAL KNEE ARTHROPLASTY Left 01/04/2020   Procedure: LEFT TOTAL KNEE ARTHROPLASTY;  Surgeon: [name], MD;  Location: [facility];  Service: Orthopedics;  Laterality: Left;    Social History   Socioeconomic History   Marital status: Single    Spouse name: Not on file   Number of children: Not on file   Years of education: Not on file   Highest education level: Not on file  Occupational History   Occupation: unemployed  Tobacco Use   Smoking status: Every Day    Packs/day: 0.25    Years: 15.00    Additional pack years: 0.00    Total pack years: 3.75    Types: Cigarettes   Smokeless tobacco: Never  Vaping Use   Vaping Use: Never used  Substance and Sexual Activity   Alcohol use: Yes    Alcohol/week: 2.0 - 3.0 standard drinks of alcohol    Types: 2 - 3 Cans of beer per week    Comment: 03/18/20   "somedays none"   Drug use: Yes    Types: Marijuana    Comment: Last time 04/02/20   Sexual activity: Not Currently    Birth control/protection: Post-menopausal  Other Topics Concern   Not on file  Social History Narrative   Patient has been living alone; however, plans to discharge home to sister, Crystal Patterson's, house   Social Determinants of Health   Financial Resource Strain: Not on file  Food Insecurity: Not on file  Transportation Needs: Not on file  Physical Activity: Not on file  Stress: Not on file  Social Connections: Not on file  Intimate Partner Violence: Not on file   Family History  Problem Relation Age of Onset   Pancreatic cancer Mother    Colon cancer Father     Current Facility-Administered Medications  Medication Dose Route Frequency Provider Last Rate Last Admin   0.9 %  sodium chloride infusion   Intravenous Continuous Victorino Sparrow, MD        No Known Allergies   REVIEW OF SYSTEMS:  [X]  denotes positive finding, [ ]  denotes negative finding Cardiac  Comments:  Chest pain or chest pressure:     Shortness of breath upon exertion:    Short of breath when lying flat:    Irregular heart rhythm:        Vascular    Pain in calf, thigh, or hip brought on by ambulation:    Pain in feet at night that wakes you up from your sleep:     Blood clot in your veins:    Leg swelling:         Pulmonary    Oxygen at home:    Productive cough:     Wheezing:         Neurologic    Sudden weakness in arms or legs:     Sudden numbness in arms or legs:  Sudden onset of difficulty speaking or slurred speech:    Temporary loss of vision in one eye:     Problems with dizziness:         Gastrointestinal    Blood in stool:     Vomited blood:         Genitourinary    Burning when urinating:     Blood in urine:        Psychiatric    Major depression:         Hematologic    Bleeding problems:    Problems with blood clotting too easily:        Skin    Rashes or ulcers:        Constitutional    Fever or chills:      PHYSICAL EXAMINATION:  Vitals:   06/30/22 1038 06/30/22 1041  BP: (!) 86/63   Pulse:  (!) 122  Resp: 18   Temp: 98.2 F (36.8 C)   TempSrc: Temporal   SpO2:  100%  Weight: 66.2 kg   Height: 5\' 4"  (1.626 m)     General:  WDWN in NAD; vital signs documented above Gait: Not observed HENT: WNL, normocephalic Pulmonary: normal non-labored breathing , without wheezing Cardiac: regular HR Abdomen: soft, NT, no masses Skin: without rashes Vascular Exam/Pulses:  Right Left  Radial 1+ (weak) 1+ (weak)  Ulnar    Femoral    Popliteal    DP Unable to palpate Unable to palpate   PT     Extremities: with ischemic changes, with Gangrene , without cellulitis; with open wounds;  Patient missing multiple fingers on bilateral hands, left AKA, right great toe wound Musculoskeletal: no muscle wasting or atrophy  Neurologic: A&O X 3;  No focal weakness or paresthesias are detected Psychiatric:  The pt has Normal affect.   Non-Invasive Vascular Imaging:          ASSESSMENT/PLAN: Crystal Patterson is a 57 y.o. female presenting with right lower extremity critical limb ischemia with tissue loss at the great toe.  On physical exam, she had nonpalpable pulses in the foot.  I was unable to palpate femoral pulses as she was in a wheelchair, and could not make it to the exam table.  ABIs were 0 at the level of the ankle, with no waveform appreciated.  I had a long discussion with Crystal Patterson regarding her severe peripheral arterial disease.  In evaluating her ABI, there is multilevel occlusive disease.  In the setting of lupus, I am worried she has burned out all of her tibial vessels.  An honest discussion with her that if I am unable to improve her blood flow, she will likely require an above-knee amputation on the right as well.  Both her and her daughter understood this.  I will do everything possible in an effort to improve distal perfusion for wound healing and right leg salvage.  Plan for angiogram with possible intervention Wednesday. Crystal Patterson continues to smoke.   Victorino Sparrow, MD [facility] [phone]

## 2022-06-30 NOTE — Progress Notes (Signed)
Pt stood at bedside x 3 with no signs of oozing from groin site

## 2022-06-30 NOTE — Op Note (Signed)
Patient name: Crystal Patterson MRN: [id number] DOB: October 26, 1965 Sex: female  06/30/2022 Pre-operative Diagnosis: Right lower extremity critical limb ischemia with tissue loss at the great toe Post-operative diagnosis:  Same Surgeon:  Victorino Sparrow, MD Procedure Performed: 1.  Ultrasound-guided micropuncture access of the left common femoral artery 2.  Aortogram 3.  Secondary cannulation, right lower extremity angiogram 4.  X2 Third cannulation, superficial femoral artery, posterior tibial artery angiograms 5.  Left lower extremity angiogram 6.  Drug-eluting stenting superficial femoral artery 6 x 150 mm Eluvia, 5 mm postdilated 7.  2.5 x 80 mm balloon angioplasty posterior tibial artery 8.  Contrast volume 65 mm, sedation time 57 minutes   Indications: Patient is a 57 year old female with longstanding lupus, who has known peripheral arterial disease which is affected medium and small vessels for a number of years.  She has had a left-sided above-knee amputation, multiple digits on the hands amputated.  She presented to my office with [place] 5 critical limb ischemia in the right lower extremity with tissue loss of the great toe.  ABI was 0.  After discussing the risk and benefits of right lower extremity angiogram in an effort to define and improve distal perfusion for wound healing, Crystal Patterson elected to proceed  Findings:  Aortogram: Patent celiac artery, patent superior mesenteric artery, patent bilateral renal arteries, patent infrarenal aorta On the right: Widely patent common iliac artery, hypogastric artery, external iliac artery, common femoral artery, profunda.  The distal femoral artery was patent to the mid femur prior to occlusion with reconstitution of the P1 segment of the popliteal artery.  There was disease within the popliteal artery that was nonflow limiting. Three-vessel runoff to the foot, posterior tibial artery dominant filling plantar arteries in the foot.  80 mm section in  the mid posterior tibial artery with severe disease and greater than 90% stenosis in multiple areas.  The peroneal artery is atretic.  The anterior tibial artery is sluggish.  Plantar arch not intact. On the left: Widely patent common iliac artery, hypogastric, external iliac artery, common femoral artery, profunda.  The superficial femoral artery is occluded.   Procedure:  The patient was identified in the holding area and taken to room 8.  The patient was then placed supine on the table and prepped and draped in the usual sterile fashion.  A time out was called.  Ultrasound was used to evaluate the left common femoral artery.  It was patent .  A digital ultrasound image was acquired.  A micropuncture needle was used to access the left common femoral artery under ultrasound guidance.  An 018 wire was advanced without resistance and a micropuncture sheath was placed.  The 018 wire was removed and a benson wire was placed.  The micropuncture sheath was exchanged for a 5 french sheath.  An omniflush catheter was advanced over the wire to the level of L-1.  An abdominal angiogram was obtained.  Next, using the omniflush catheter and a benson wire, the aortic bifurcation was crossed and the catheter was placed into theright external iliac artery and right runoff was obtained.  Right common femoral artery angiogram was completed prior to arteriotomy management.  I elected to attempt intervention on the right lower extremity superficial femoral artery, posterior tibial artery as it was the dominant runoff to the foot.  The patient was heparinized and a 6 x 45 cm sheath was parked in the proximal superficial femoral artery.  A series of wires and catheters were  used to cross the 120 mm CTO.  The wire was exchanged for a catheter and angiography followed from the distal superficial femoral artery to prove that the wire was true lumen prior to stenting.  A 6 x 150 mm drug-eluting Eluvia stent was brought onto the  field and deployed.  This was postdilated with a 5 mm balloon.  Follow-up angiography demonstrated excellent result with no flow-limiting stenosis.  There was some eccentric atherosclerotic disease in the popliteal artery.  This was nonflow limiting, therefore I decided not to treat this area.  A 0.018 wire and catheter were used to navigate the entirety of the posterior tibial artery.  The wire was parked in a plantar artery in the foot.  This was exchanged for a catheter with angiography proving true lumen and filling of the plantar arteries.  A 2.5 x 80 mm balloon was brought to the field and expanded along a lesion at the mid posterior tibial artery for 3 minutes.  Follow-up angiography demonstrated excellent result with resolution of flow-limiting stenosis.  Left-sided common femoral artery angiogram followed through the sheath.  I elected to use a ProGlide device.  Impression: Successful recanalization of the superficial femoral artery 6 x 150 mm Eluvia stent, balloon angioplasty of the posterior tibial artery with resolution of flow-limiting stenosis.  Patient now with inline flow to the foot with brisk runoff via the posterior tibial artery.  The anterior tibial artery and peroneal arteries are also patent.  Patient has been optimally revascularized.  I will reach out to her podiatrist to discuss to amputation.      Fara Olden, MD [facility] Office: [phone]

## 2022-07-01 ENCOUNTER — Encounter (HOSPITAL_COMMUNITY): Payer: Self-pay | Admitting: Vascular Surgery

## 2022-07-05 ENCOUNTER — Ambulatory Visit (INDEPENDENT_AMBULATORY_CARE_PROVIDER_SITE_OTHER): Payer: 59

## 2022-07-05 ENCOUNTER — Telehealth: Payer: Self-pay | Admitting: Urology

## 2022-07-05 ENCOUNTER — Ambulatory Visit (INDEPENDENT_AMBULATORY_CARE_PROVIDER_SITE_OTHER): Payer: 59 | Admitting: Podiatry

## 2022-07-05 DIAGNOSIS — I96 Gangrene, not elsewhere classified: Secondary | ICD-10-CM

## 2022-07-05 NOTE — Telephone Encounter (Signed)
DOS - 07/07/22   AMPUTATION MPJ RIGHT --- [id number]  [employer]     PER [employer] WEBSITE FOR CPT CODE 16109 Notification or Prior Authorization is not required for the requested services You are not required to submit a notification/prior authorization based on the information provided. If you have general questions about the prior authorization requirements, visit [email or web address] > Clinician Resources > Advance and Admission Notification Requirements. The number above acknowledges your notification. Please write this reference number down for future reference. If you would like to request an organization determination, please call us at [phone].   Decision ID #: [id number]

## 2022-07-05 NOTE — H&P (View-Only) (Signed)
Subjective:   Patient ID: Crystal Patterson, female   DOB: 57 y.o.   MRN: [id number]   HPI Chief Complaint  Patient presents with   Foot Ulcer    np hx of bka left, right great toe gangrene ( referred by [facility] podiatry as urgent) offered appt 5.2 but pt could not come. hx of PAD and had angiogram [phone]    57 year old female presents the office for above concerns.  She states this started after having the ingrown toenail taken off and it did not heal on the right big toe and since she has turned the gangrene she is having quite a bit of pain to the toe.  She has undergone revascularization last week.  She denies any fevers or chills.  No drainage or pus.    Per vascular note: Impression: Successful recanalization of the superficial femoral artery 6 x 150 mm Eluvia stent, balloon angioplasty of the posterior tibial artery with resolution of flow-limiting stenosis. Patient now with inline flow to the foot with brisk runoff via the posterior tibial artery. The anterior tibial artery and peroneal arteries are also patent.   Review of Systems  All other systems reviewed and are negative.  Past Medical History:  Diagnosis Date   Anginal pain (HCC) 2020   Arthritis    Knees, hips,   CHF (congestive heart failure) (HCC)    Chronic kidney disease    COVID    mild   Elevated troponin 05/24/2016   Finger amputation, no complication    GERD (gastroesophageal reflux disease)    Gout    Heart murmur    never has caused any problems per patient 04/02/20   Hypercholesteremia    Hypertension    Infection of total left knee replacement (HCC) 01/25/2020   Lupus (HCC)    Myocardial infarction [facility])    Peripheral vascular disease (HCC)     Past Surgical History:  Procedure Laterality Date   ABDOMINAL AORTOGRAM W/LOWER EXTREMITY N/A 06/30/2022   Procedure: ABDOMINAL AORTOGRAM W/LOWER EXTREMITY;  Surgeon: Victorino Sparrow, MD;  Location: [facility] INVASIVE CV LAB;  Service: Cardiovascular;  Laterality:  N/A;   AMPUTATION Left 04/04/2020   Procedure: LEFT ABOVE KNEE AMPUTATION;  Surgeon: Nadara Mustard, MD;  Location: [facility] OR;  Service: Orthopedics;  Laterality: Left;   CARDIAC CATHETERIZATION  05/25/2016   COLONOSCOPY N/A 01/17/2019   Procedure: COLONOSCOPY;  Surgeon: Corbin Ade, MD;  Location: [facility];  Service: Endoscopy;  Laterality: N/A;  10:30   EXCISIONAL TOTAL KNEE ARTHROPLASTY Left 02/05/2020   Procedure: REPEAT IRRIGATION AND DEBRIDEMENT LEFT KNEE;  Surgeon: [name], MD;  Location: [facility] ORS;  Service: Orthopedics;  Laterality: Left;   I & D EXTREMITY Left 02/08/2020   Procedure: EXCISIONAL DEBRIDEMENT LEFT KNEE;  Surgeon: Nadara Mustard, MD;  Location: [facility] OR;  Service: Orthopedics;  Laterality: Left;   I & D KNEE WITH POLY EXCHANGE Left 01/25/2020   Procedure: INCISION AND DRAINAGE LEFT KNEE WITH POLY-LINER EXCHANGE;  Surgeon: [name], MD;  Location: [facility] ORS;  Service: Orthopedics;  Laterality: Left;   IRRIGATION AND DEBRIDEMENT KNEE Left 01/30/2020   Procedure: REPEAT IRRIGATION AND DEBRIDEMENT LEFT KNEE PLACEMENT OF WOUND VAC;  Surgeon: [name], MD;  Location: [facility] ORS;  Service: Orthopedics;  Laterality: Left;   left finger amputations     PERIPHERAL VASCULAR INTERVENTION  06/30/2022   Procedure: PERIPHERAL VASCULAR INTERVENTION;  Surgeon: Victorino Sparrow, MD;  Location: [facility] INVASIVE CV LAB;  Service: Cardiovascular;;   POLYPECTOMY  01/17/2019   Procedure: POLYPECTOMY;  Surgeon: Corbin Ade, MD;  Location: [facility];  Service: Endoscopy;;   right hand surgery     right knee arthroscopy     RIGHT/LEFT HEART CATH AND CORONARY ANGIOGRAPHY N/A 05/25/2016   Procedure: Right/Left Heart Cath and Coronary Angiography;  Surgeon: Lennette Bihari, MD;  Location: [facility] INVASIVE CV LAB;  Service: Cardiovascular;  Laterality: N/A;   SKIN SPLIT GRAFT Left 02/08/2020   Procedure: SKIN GRAFT SPLIT THICKNESS LEFT KNEE;  Surgeon: Nadara Mustard, MD;   Location: [facility] OR;  Service: Orthopedics;  Laterality: Left;   TOTAL KNEE ARTHROPLASTY Left 01/04/2020   Procedure: LEFT TOTAL KNEE ARTHROPLASTY;  Surgeon: [name], MD;  Location: [facility] ORS;  Service: Orthopedics;  Laterality: Left;    No current outpatient medications on file.  No Known Allergies        Objective:  Physical Exam  General: AAO x3, NAD  Dermatological: Gangrenous changes present to the right hallux distally.  There is no drainage or pus.  No fluctuance or crepitation.  Appears to be stable.      Vascular: Dorsalis right is warm and well-perfused at this time.  Neruologic: Sensation decreased  Musculoskeletal: Diffuse tenderness to the right lower extremity but most of her tenderness is localized to the right hallux.  Gait: In wheelchair      Assessment:   Gangrene right hallux     Plan:  -Treatment options discussed including all alternatives, risks, and complications -Etiology of symptoms were discussed -X-rays were obtained and reviewed with the patient.  3 views of the right foot obtained.  On the [facility] view there is radiolucency of the distal aspect of the distal phalanx. -Given the pain, the area as she wants to proceed with amputation of the toe.  She states that previously there was on and there would relieve her pain.  She is recent undergone revascularization.  Will plan for right hallux amputation later this week.  Monitor closely for signs or symptoms of infection. -The incision placement as well as the postoperative course was discussed with the patient. I discussed risks of the surgery which include, but not limited to, infection, bleeding, pain, swelling, need for further surgery, delayed or nonhealing, painful or ugly scar, numbness or sensation changes transfer lesions, further deformity, DVT/PE, loss of toe/foot. Patient understands these risks and wishes to proceed with surgery. The surgical consent was reviewed with the patient all  3 pages were signed. No promises or guarantees were given to the outcome of the procedure. All questions were answered to the best of my ability. Before the surgery the patient was encouraged to call the office if there is any further questions. The surgery will be performed at the [facility] on an outpatient basis.  [name] DPM

## 2022-07-05 NOTE — Progress Notes (Unsigned)
Subjective:   Patient ID: Crystal Patterson, female   DOB: 57 y.o.   MRN: [id number]   HPI Chief Complaint  Patient presents with   Foot Ulcer    np hx of bka left, right great toe gangrene ( referred by [facility] podiatry as urgent) offered appt 5.2 but pt could not come. hx of PAD and had angiogram 5.1.2024    Started as an ingrown toenail and it was taken off and it didn't heal. She has been having extreme pain in the toe   Per vascular note: Impression: Successful recanalization of the superficial femoral artery 6 x 150 mm Eluvia stent, balloon angioplasty of the posterior tibial artery with resolution of flow-limiting stenosis. Patient now with inline flow to the foot with brisk runoff via the posterior tibial artery. The anterior tibial artery and peroneal arteries are also patent.   ROS      Objective:  Physical Exam  General: AAO x3, NAD  Dermatological:       Vascular: Dorsalis Pedis artery and Posterior Tibial artery pedal pulses are 2/4 bilateral with immedate capillary fill time. Pedal hair growth present. No varicosities and no lower extremity edema present bilateral. There is no pain with calf compression, swelling, warmth, erythema.   Neruologic: Grossly intact via light touch bilateral. Vibratory intact via tuning fork bilateral. Protective threshold with Semmes Wienstein monofilament intact to all pedal sites bilateral. Patellar and Achilles deep tendon reflexes 2+ bilateral. No Babinski or clonus noted bilateral.   Musculoskeletal: No gross boney pedal deformities bilateral. No pain, crepitus, or limitation noted with foot and ankle range of motion bilateral. Muscular strength 5/5 in all groups tested bilateral.  Gait: Unassisted, Nonantalgic.       Assessment:  ***     Plan:  ***

## 2022-07-06 ENCOUNTER — Other Ambulatory Visit: Payer: Self-pay

## 2022-07-06 ENCOUNTER — Encounter (HOSPITAL_COMMUNITY): Payer: Self-pay | Admitting: Podiatry

## 2022-07-06 NOTE — Progress Notes (Signed)
Spoke with pt for pre-op call. Pt has hx of NSTEMi in 2018. Has not seen a cardiologist since 2021. Pt has been on Aspirin for quite awhile. Plavix was started on 06/30/22 by Dr. Karin Lieu per Antionette Poles, PA. Chart was sent for Anesthesia review. I had called the office earlier and Dr. Gabriel Rung surgery scheduler told me to have pt not take Aspirin or Plavix the day of surgery. I've called pt and told her to hold both in the AM. Fayrene Fearing is contacting Dr. Karin Lieu to get his input on whether the pt needs to hold it or not.   Shower instructions given to pt.

## 2022-07-06 NOTE — Progress Notes (Signed)
Called pt back and instructed her to take the Aspirin and Plavix the morning of surgery. She voiced understanding.

## 2022-07-06 NOTE — Anesthesia Preprocedure Evaluation (Signed)
Anesthesia Evaluation  Patient identified by MRN, date of birth, ID band Patient awake    Reviewed: Allergy & Precautions, H&P , NPO status , Patient's Chart, lab work & pertinent test results  Airway Mallampati: II  TM Distance: >3 FB Neck ROM: Full    Dental  (+) Teeth Intact, Dental Advisory Given   Pulmonary asthma , Current Smoker and Patient abstained from smoking. Less than ppd No inhalers   Pulmonary exam normal breath sounds clear to auscultation       Cardiovascular hypertension (125/85 preop), Pt. on medications + CAD, + Past MI, + Peripheral Vascular Disease and +CHF  Normal cardiovascular exam Rhythm:Regular Rate:Normal  History of NSTEMI (2018, normal coronaries on cath, recommended medical therapy for diastolic heart failure hypertension), HTN, severe LVH (by 2018 echo), mild mitral stenosis (by 2018 echo). Current smoker. Last cardiac evaluation was by Dr. Dina Rich 01/02/20 prior to knee replacement. Per note 01/02/20, "Preoperative evaluation - considering knee replacement surgery - no active cardiopulmonary symptoms - exertion limited by chronic knee pain, cannot assess by history - extensive cardiac testing in 2018 including cath and echo that were benign - would recommend proceeding with surgery as planned, no repeat cardiac testing is indicated."   Neuro/Psych  Headaches PSYCHIATRIC DISORDERS  Depression       GI/Hepatic ,GERD  ,,(+)       alcohol use and marijuana use2beer/d, occasional marijuana    Endo/Other  negative endocrine ROS    Renal/GU negative Renal ROS  negative genitourinary   Musculoskeletal  (+) Arthritis , Osteoarthritis,    Abdominal   Peds negative pediatric ROS (+)  Hematology  (+) Blood dyscrasia, anemia Hb 9.9   Anesthesia Other Findings   Reproductive/Obstetrics negative OB ROS                              Anesthesia Physical Anesthesia  Plan  ASA: 2  Anesthesia Plan: MAC and Regional   Post-op Pain Management: Regional block* and Tylenol PO (pre-op)*   Induction:   PONV Risk Score and Plan: 2 and Propofol infusion and TIVA  Airway Management Planned: Natural Airway and Simple Face Mask  Additional Equipment: None  Intra-op Plan:   Post-operative Plan:   Informed Consent: I have reviewed the patients History and Physical, chart, labs and discussed the procedure including the risks, benefits and alternatives for the proposed anesthesia with the patient or authorized representative who has indicated his/her understanding and acceptance.       Plan Discussed with: CRNA  Anesthesia Plan Comments: ( )         Anesthesia Quick Evaluation

## 2022-07-06 NOTE — Progress Notes (Signed)
Anesthesia Chart Review: Crystal Patterson  Case: [id number] Date/Time: 07/07/22 1346   Procedure: AMPUTATION TOE (FRIST RIGHT TOE) (Toe)   Anesthesia type: General   Pre-op diagnosis: GANGRENE   [facility]: [facility] ROOM 05 / [facility]   Surgeons: [name], DPM       DISCUSSION: Patient is a 57 year old female scheduled for the above procedure.   History includes smoking, HTN, hypercholesterolemia, NSTEMI (2018, normal coronaries on LHC), severe LVH (by 2018 echo), murmur (mild mitral stenosis by 2018 echo), CHF, CKD, Lupus (SLE, lupus nephritis), GERD, gout, osteoarthritis (left TKA 01/04/20, complicated by dehiscence/infection-->left TKA 04/04/20), PAD (DES right SFA, angioplasty right PT artery 06/30/22; multiple finger amputations in setting of PAD & Lupus with arterial involvement).   She is not currently followed by cardiology. Admission on 05/24/16 with NSTEMI in setting of smoking, poorly controlled HTN, PAD, and SLE. 05/26/26 RHC/LHC showed normal coronaries, mild elevation of right heart pressures. Medical therapy with smoking cessation and HTN control recommended. Last cardiology visit with Dr. Dina Rich was on 01/02/20 prior to knee replacement with no repeat cardiac testing recommended at that time.   H/H 9.9/29.0 on 06/30/22. She is on ASA and Plavix following recent right SFA stent. Dr. Ardelle Anton was okay for her to continue both for procedure.  She had recent EGD and colonoscopy in February 2024 at [facility] (see below).   She is a same day work-up with right first toe gangrene. She has recent evaluations by vascular surgeon Dr. Karin Lieu and orthopedic surgeon Dr. Lajoyce Corners without the past 30 days. Anesthesia team to evaluate on the day of surgery.   VS:  BP Readings from Last 3 Encounters:  06/30/22 (!) 146/92  06/25/22 101/71  04/25/20 (!) 87/46   Pulse Readings from Last 3 Encounters:  06/30/22 63  06/25/22 84  04/25/20 63     PROVIDERS: Benetta Spar, MD is PCP   Gerarda Fraction, MD is vascular surgeon   LABS: For day of surgery as indicated. H/H 9.9/29.0, K 3.5, Cr 0.70 on 06/30/22. A1c < 3.8 on 06/19/22 [facility]).    OTHER: Colonoscopy 04/24/22 ([facility]): Postprocedure Diagnosis: Exam to terminal ileum without evidence of  bleeding.  Whole colon mild diverticulosis.  At 55 to 60 cm from anal verge, type colonic stricture with about  concerning features, fibrotic noted.  Balloon dilatation to 13.5 mm  performed.  Stricture biopsy obtained.  About 5 cm distal to the stricture, site was  tattooed.  - Colon, transverse, biopsy: Benign colonic mucosa with reactive/reparative changes; no evidence of malignancy seen in this material.  EGD 04/22/22 ([facility]): Impression:  Moderate erythematous and ulcerated mucosa; bleeding occurred after  intervention in the lower third of the esophagus; performed cold forceps  biopsies to rule out Barrett's esophagus  The stomach, duodenal bulb, 1st part of the duodenum, 2nd part of the  duodenum and 3rd part of the duodenum appeared normal.  Small type I hiatal hernia.    IMAGES: MRI RLE 06/21/22 [facility]): Impression: 1. Very limited examination due to patient motion.  2. Subcutaneous soft tissue swelling/edema surrounding the big toe  suggesting cellulitis. No definite MR findings for septic arthritis  or osteomyelitis but examination is very limited.  3. Mild myofasciitis without definite findings for pyomyositis.  Recommend close clinical follow-up and if the patient does not  improve a repeat study with sedation and pain medicine may be  indicated.    EKG: 06/30/22: Sinus bradycardia at 58  bpm Septal infarct , age undetermined Abnormal ECG When compared with ECG of 27-Dec-2019 13:13, No significant change was found Confirmed by Julien Nordmann [phone]) on 06/30/2022 8:46:30 PM   CV: US Carotid 12/07/16: Summary:  - Technically difficult due to extemely high bifurcation.  - Bilateral - 1% to 39%  ICA stenosis.  - Left extrenal carotid artey could not be visualized due to    extremely high bifurcation.  - Right vertebral artery flow is antegrade.  - Left - Unable to insonate the verterbal artery due to respiratory    interference.    TTE 12/06/2016: - Left ventricle: The cavity size was normal. Wall thickness was    increased in a pattern of severe LVH. Systolic function was    vigorous. The estimated ejection fraction was in the range of 65%    to 70%. Wall motion was normal; there were no regional wall    motion abnormalities. Doppler parameters are consistent with    abnormal left ventricular relaxation (grade 1 diastolic    dysfunction). Doppler parameters are consistent with high    ventricular filling pressure.  - Mitral valve: Severely calcified annulus. The findings are    consistent with mild stenosis. Valve area by pressure half-time:    1.68 cm^2. Valve area by continuity equation (using LVOT flow):    2.04 cm^2.  - Left atrium: The atrium was mildly dilated.  - Pulmonary arteries: PA peak pressure: 33 mm Hg (S).   Impressions:  - Vigorous LV systolic function; severe LVH; mild diastolic    dysfunction with elevated LV filling pressure; intracavitary    gradient of approximately 2.3 m/s; mild LAE; severe MAC with mild    MS.    Right/left heart cath 05/25/2016: The left ventricular systolic function is normal. LV end diastolic pressure is normal. The left ventricular ejection fraction is greater than 65% by visual estimate.   Hyperdynamic LV function with LVH and an ejection fraction of approximately 70%.  Significant mitral annular calcification.   Normal coronary arteries.   Mild elevation of right heart pressures.   RECOMMENDATION: Medical therapy for chronic diastolic heart failure and hypertension.     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) 01/25/2020   Lupus (HCC)    Myocardial infarction [facility])    Peripheral vascular disease (HCC)     Past Surgical History:  Procedure Laterality Date   ABDOMINAL AORTOGRAM W/LOWER EXTREMITY N/A 06/30/2022   Procedure: ABDOMINAL AORTOGRAM W/LOWER EXTREMITY;  Surgeon: Victorino Sparrow, MD;  Location: [facility] INVASIVE CV LAB;  Service: Cardiovascular;  Laterality: N/A;   AMPUTATION Left 04/04/2020   Procedure: LEFT ABOVE KNEE AMPUTATION;  Surgeon: Nadara Mustard, MD;  Location: [facility] OR;  Service: Orthopedics;  Laterality: Left;   CARDIAC CATHETERIZATION  05/25/2016   COLONOSCOPY N/A 01/17/2019   Procedure: COLONOSCOPY;  Surgeon: Corbin Ade, MD;  Location: [facility] ENDO SUITE;  Service: Endoscopy;  Laterality: N/A;  10:30   EXCISIONAL TOTAL KNEE ARTHROPLASTY Left  02/05/2020   Procedure: REPEAT IRRIGATION AND DEBRIDEMENT LEFT KNEE;  Surgeon: [name], MD;  Location: [facility] ORS;  Service: Orthopedics;  Laterality: Left;   I & D EXTREMITY Left 02/08/2020   Procedure: EXCISIONAL DEBRIDEMENT LEFT KNEE;  Surgeon: Nadara Mustard, MD;  Location: [facility] OR;  Service: Orthopedics;  Laterality: Left;   I & D KNEE WITH POLY EXCHANGE Left 01/25/2020   Procedure: INCISION AND DRAINAGE LEFT KNEE WITH POLY-LINER EXCHANGE;  Surgeon: [name], MD;  Location: [facility] ORS;  Service: Orthopedics;  Laterality: Left;   IRRIGATION AND DEBRIDEMENT KNEE Left 01/30/2020   Procedure: REPEAT IRRIGATION AND DEBRIDEMENT LEFT KNEE PLACEMENT OF WOUND VAC;  Surgeon: [name], MD;  Location: [facility] ORS;  Service: Orthopedics;  Laterality: Left;   left finger amputations     PERIPHERAL VASCULAR INTERVENTION   06/30/2022   Procedure: PERIPHERAL VASCULAR INTERVENTION;  Surgeon: Victorino Sparrow, MD;  Location: [facility] INVASIVE CV LAB;  Service: Cardiovascular;;   POLYPECTOMY  01/17/2019   Procedure: POLYPECTOMY;  Surgeon: Corbin Ade, MD;  Location: [facility];  Service: Endoscopy;;   right hand surgery     right knee arthroscopy     RIGHT/LEFT HEART CATH AND CORONARY ANGIOGRAPHY N/A 05/25/2016   Procedure: Right/Left Heart Cath and Coronary Angiography;  Surgeon: Lennette Bihari, MD;  Location: [facility] INVASIVE CV LAB;  Service: Cardiovascular;  Laterality: N/A;   SKIN SPLIT GRAFT Left 02/08/2020   Procedure: SKIN GRAFT SPLIT THICKNESS LEFT KNEE;  Surgeon: Nadara Mustard, MD;  Location: [facility] OR;  Service: Orthopedics;  Laterality: Left;   TOTAL KNEE ARTHROPLASTY Left 01/04/2020   Procedure: LEFT TOTAL KNEE ARTHROPLASTY;  Surgeon: [name], MD;  Location: [facility] ORS;  Service: Orthopedics;  Laterality: Left;    MEDICATIONS: No current facility-administered medications for this encounter.    allopurinol (ZYLOPRIM) 100 MG tablet   amLODipine (NORVASC) 10 MG tablet   amoxicillin-clavulanate (AUGMENTIN) 875-125 MG tablet   aspirin EC 81 MG tablet   atenolol (TENORMIN) 25 MG tablet   atorvastatin (LIPITOR) 40 MG tablet   benazepril (LOTENSIN) 10 MG tablet   benazepril (LOTENSIN) 40 MG tablet   clopidogrel (PLAVIX) 75 MG tablet   Ensure (ENSURE)   escitalopram (LEXAPRO) 10 MG tablet   furosemide (LASIX) 40 MG tablet   gabapentin (NEURONTIN) 300 MG capsule   ibuprofen (ADVIL) 800 MG tablet   omeprazole (PRILOSEC) 20 MG capsule   oxyCODONE (OXY IR/ROXICODONE) 5 MG immediate release tablet   potassium chloride (KLOR-CON) 10 MEQ tablet   traZODone (DESYREL) 100 MG tablet    Shonna Chock, PA-C Surgical Short Stay/Anesthesiology [facility] Phone [phone] [facility] Phone [phone] 07/06/2022 11:56 AM

## 2022-07-06 NOTE — Progress Notes (Signed)
Anesthesia Chart Review: Same day workup  History of NSTEMI (2018, normal coronaries on cath, recommended medical therapy for diastolic heart failure hypertension), HTN, severe LVH (by 2018 echo), mild mitral stenosis (by 2018 echo). Current smoker. Last cardiac evaluation was by Dr. Dina Rich 01/02/20 prior to knee replacement. Per note 01/02/20, "Preoperative evaluation - considering knee replacement surgery - no active cardiopulmonary symptoms - exertion limited by chronic knee pain, cannot assess by history - extensive cardiac testing in 2018 including cath and echo that were benign - would recommend proceeding with surgery as planned, no repeat cardiac testing is indicated."   Patient recently seen by vascular surgeon Dr. Karin Lieu 06/25/2022 for right leg critical limb ischemia with tissue loss.  She subsequently underwent lower extremity angiogram 06/30/2022 with placement of DES to the right SFA as well as balloon angioplasty of the posterior tibial artery.  After successful revascularization she was referred back to podiatry to discuss amputation of right great toe for nonhealing wound with tissue loss.  She does have history of multiple prior finger amputations as well as left-sided AKA by Dr. Due to 04/2020.  Per Dr. Ardelle Anton, okay for patient to stay on Plavix and aspirin for procedure.  She will need day of surgery labs and evaluation.  Reviewed history with anesthesiologist Dr. Hyacinth Meeker. Okay to proceed as planned barring acute status change.    EKG 06/30/22: Sinus bradycardia.  Rate 58.  Septal infarct, age undetermined.  No significant change.   TTE 12/06/2016: - Left ventricle: The cavity size was normal. Wall thickness was    increased in a pattern of severe LVH. Systolic function was    vigorous. The estimated ejection fraction was in the range of 65%    to 70%. Wall motion was normal; there were no regional wall    motion abnormalities. Doppler parameters are consistent with     abnormal left ventricular relaxation (grade 1 diastolic    dysfunction). Doppler parameters are consistent with high    ventricular filling pressure.  - Mitral valve: Severely calcified annulus. The findings are    consistent with mild stenosis. Valve area by pressure half-time:    1.68 cm^2. Valve area by continuity equation (using LVOT flow):    2.04 cm^2.  - Left atrium: The atrium was mildly dilated.  - Pulmonary arteries: PA peak pressure: 33 mm Hg (S).   Impressions:   - Vigorous LV systolic function; severe LVH; mild diastolic    dysfunction with elevated LV filling pressure; intracavitary    gradient of approximately 2.3 m/s; mild LAE; severe MAC with mild    MS.    Right/left heart cath 05/25/2016:   The left ventricular systolic function is normal. LV end diastolic pressure is normal. The left ventricular ejection fraction is greater than 65% by visual estimate.   Hyperdynamic LV function with LVH and an ejection fraction of approximately 70%.  Significant mitral annular calcification.   Normal coronary arteries.   Mild elevation of right heart pressures.   RECOMMENDATION: Medical therapy for chronic diastolic heart failure and hypertension.    Crystal Patterson [facility] Short Stay Center/Anesthesiology Phone [phone] 07/06/2022 11:47 AM

## 2022-07-07 ENCOUNTER — Ambulatory Visit (HOSPITAL_COMMUNITY): Payer: 59

## 2022-07-07 ENCOUNTER — Encounter (HOSPITAL_COMMUNITY): Payer: Self-pay | Admitting: Podiatry

## 2022-07-07 ENCOUNTER — Encounter: Payer: Self-pay | Admitting: Podiatry

## 2022-07-07 ENCOUNTER — Ambulatory Visit (HOSPITAL_BASED_OUTPATIENT_CLINIC_OR_DEPARTMENT_OTHER): Payer: 59 | Admitting: Physician Assistant

## 2022-07-07 ENCOUNTER — Other Ambulatory Visit: Payer: Self-pay

## 2022-07-07 ENCOUNTER — Encounter (HOSPITAL_COMMUNITY)
Admission: RE | Disposition: A | Discharge status: Home / Self Care | Payer: Self-pay | Source: Ambulatory Visit | Attending: Podiatry

## 2022-07-07 ENCOUNTER — Ambulatory Visit (HOSPITAL_COMMUNITY)
Admission: RE | Admit: 2022-07-07 | Discharge: 2022-07-07 | Disposition: A | Discharge status: Home / Self Care | Payer: 59 | Source: Ambulatory Visit | Attending: Podiatry | Admitting: Podiatry

## 2022-07-07 ENCOUNTER — Ambulatory Visit (HOSPITAL_COMMUNITY): Payer: 59 | Admitting: Physician Assistant

## 2022-07-07 DIAGNOSIS — I130 Hypertensive heart and chronic kidney disease with heart failure and stage 1 through stage 4 chronic kidney disease, or unspecified chronic kidney disease: Secondary | ICD-10-CM | POA: Diagnosis not present

## 2022-07-07 DIAGNOSIS — D759 Disease of blood and blood-forming organs, unspecified: Secondary | ICD-10-CM | POA: Insufficient documentation

## 2022-07-07 DIAGNOSIS — Z89612 Acquired absence of left leg above knee: Secondary | ICD-10-CM | POA: Insufficient documentation

## 2022-07-07 DIAGNOSIS — M109 Gout, unspecified: Secondary | ICD-10-CM | POA: Diagnosis not present

## 2022-07-07 DIAGNOSIS — M3219 Other organ or system involvement in systemic lupus erythematosus: Secondary | ICD-10-CM | POA: Diagnosis not present

## 2022-07-07 DIAGNOSIS — N189 Chronic kidney disease, unspecified: Secondary | ICD-10-CM | POA: Diagnosis not present

## 2022-07-07 DIAGNOSIS — I252 Old myocardial infarction: Secondary | ICD-10-CM | POA: Diagnosis not present

## 2022-07-07 DIAGNOSIS — I2510 Atherosclerotic heart disease of native coronary artery without angina pectoris: Secondary | ICD-10-CM | POA: Insufficient documentation

## 2022-07-07 DIAGNOSIS — Z7902 Long term (current) use of antithrombotics/antiplatelets: Secondary | ICD-10-CM | POA: Diagnosis not present

## 2022-07-07 DIAGNOSIS — F17210 Nicotine dependence, cigarettes, uncomplicated: Secondary | ICD-10-CM

## 2022-07-07 DIAGNOSIS — I5032 Chronic diastolic (congestive) heart failure: Secondary | ICD-10-CM | POA: Diagnosis not present

## 2022-07-07 DIAGNOSIS — M86171 Other acute osteomyelitis, right ankle and foot: Secondary | ICD-10-CM | POA: Insufficient documentation

## 2022-07-07 DIAGNOSIS — I96 Gangrene, not elsewhere classified: Secondary | ICD-10-CM | POA: Insufficient documentation

## 2022-07-07 DIAGNOSIS — Z95820 Peripheral vascular angioplasty status with implants and grafts: Secondary | ICD-10-CM | POA: Insufficient documentation

## 2022-07-07 DIAGNOSIS — I110 Hypertensive heart disease with heart failure: Secondary | ICD-10-CM | POA: Diagnosis not present

## 2022-07-07 DIAGNOSIS — J45909 Unspecified asthma, uncomplicated: Secondary | ICD-10-CM | POA: Diagnosis not present

## 2022-07-07 DIAGNOSIS — Z7982 Long term (current) use of aspirin: Secondary | ICD-10-CM | POA: Diagnosis not present

## 2022-07-07 DIAGNOSIS — D649 Anemia, unspecified: Secondary | ICD-10-CM | POA: Insufficient documentation

## 2022-07-07 DIAGNOSIS — M3214 Glomerular disease in systemic lupus erythematosus: Secondary | ICD-10-CM | POA: Diagnosis not present

## 2022-07-07 DIAGNOSIS — E7800 Pure hypercholesterolemia, unspecified: Secondary | ICD-10-CM | POA: Insufficient documentation

## 2022-07-07 DIAGNOSIS — K219 Gastro-esophageal reflux disease without esophagitis: Secondary | ICD-10-CM | POA: Insufficient documentation

## 2022-07-07 HISTORY — DX: Peripheral vascular disease, unspecified: I73.9

## 2022-07-07 HISTORY — DX: COVID-19: U07.1

## 2022-07-07 HISTORY — DX: Chronic kidney disease, unspecified: N18.9

## 2022-07-07 HISTORY — PX: AMPUTATION TOE: SHX6595

## 2022-07-07 HISTORY — DX: Acute myocardial infarction, unspecified: I21.9

## 2022-07-07 SURGERY — AMPUTATION, TOE
Anesthesia: Monitor Anesthesia Care | Site: Toe | Laterality: Right

## 2022-07-07 MED ORDER — MIDAZOLAM HCL 2 MG/2ML IJ SOLN: 1.0000 mg | Freq: Once | INTRAMUSCULAR | Status: AC

## 2022-07-07 MED ORDER — BUPIVACAINE HCL (PF) 0.5 % IJ SOLN
INTRAMUSCULAR | Status: AC
  Filled 2022-07-07: qty 30

## 2022-07-07 MED ORDER — ROPIVACAINE HCL 5 MG/ML IJ SOLN
INTRAMUSCULAR | Status: DC | PRN
  Administered 2022-07-07: 40 mL via PERINEURAL

## 2022-07-07 MED ORDER — BUPIVACAINE HCL (PF) 0.5 % IJ SOLN
INTRAMUSCULAR | Status: DC | PRN
  Administered 2022-07-07: 2 mL

## 2022-07-07 MED ORDER — DEXAMETHASONE SODIUM PHOSPHATE 10 MG/ML IJ SOLN
INTRAMUSCULAR | Status: DC | PRN
  Administered 2022-07-07: 10 mg

## 2022-07-07 MED ORDER — ORAL CARE MOUTH RINSE: 15.0000 mL | Freq: Once | OROMUCOSAL | Status: AC

## 2022-07-07 MED ORDER — CHLORHEXIDINE GLUCONATE CLOTH 2 % EX PADS: 6.0000 | MEDICATED_PAD | Freq: Once | CUTANEOUS | Status: DC

## 2022-07-07 MED ORDER — MIDAZOLAM HCL 2 MG/2ML IJ SOLN
INTRAMUSCULAR | Status: AC
  Administered 2022-07-07: 1 mg via INTRAVENOUS
  Filled 2022-07-07: qty 2

## 2022-07-07 MED ORDER — LACTATED RINGERS IV SOLN: INTRAVENOUS | Status: DC

## 2022-07-07 MED ORDER — ONDANSETRON HCL 4 MG/2ML IJ SOLN
INTRAMUSCULAR | Status: AC
  Filled 2022-07-07: qty 2

## 2022-07-07 MED ORDER — CEPHALEXIN 500 MG PO CAPS: 500.0000 mg | ORAL_CAPSULE | Freq: Three times a day (TID) | ORAL | 0 refills | Status: DC

## 2022-07-07 MED ORDER — FENTANYL CITRATE (PF) 100 MCG/2ML IJ SOLN: 50.0000 ug | Freq: Once | INTRAMUSCULAR | Status: AC

## 2022-07-07 MED ORDER — LIDOCAINE HCL 2 % IJ SOLN
INTRAMUSCULAR | Status: DC | PRN
  Administered 2022-07-07: 2 mL

## 2022-07-07 MED ORDER — ACETAMINOPHEN 500 MG PO TABS
1000.0000 mg | ORAL_TABLET | Freq: Once | ORAL | Status: AC
  Administered 2022-07-07: 1000 mg via ORAL
  Filled 2022-07-07: qty 2

## 2022-07-07 MED ORDER — PROPOFOL 500 MG/50ML IV EMUL
INTRAVENOUS | Status: DC | PRN
  Administered 2022-07-07: 25 ug/kg/min via INTRAVENOUS

## 2022-07-07 MED ORDER — LIDOCAINE HCL 2 % IJ SOLN
INTRAMUSCULAR | Status: AC
  Filled 2022-07-07: qty 20

## 2022-07-07 MED ORDER — PROPOFOL 10 MG/ML IV BOLUS
INTRAVENOUS | Status: DC | PRN
  Administered 2022-07-07: 30 mg via INTRAVENOUS

## 2022-07-07 MED ORDER — FENTANYL CITRATE (PF) 100 MCG/2ML IJ SOLN
INTRAMUSCULAR | Status: AC
  Administered 2022-07-07: 50 ug via INTRAVENOUS
  Filled 2022-07-07: qty 2

## 2022-07-07 MED ORDER — CEFAZOLIN SODIUM-DEXTROSE 2-4 GM/100ML-% IV SOLN
2.0000 g | INTRAVENOUS | Status: AC
  Administered 2022-07-07: 2 g via INTRAVENOUS
  Filled 2022-07-07: qty 100

## 2022-07-07 MED ORDER — LIDOCAINE 2% (20 MG/ML) 5 ML SYRINGE
INTRAMUSCULAR | Status: AC
  Filled 2022-07-07: qty 5

## 2022-07-07 MED ORDER — 0.9 % SODIUM CHLORIDE (POUR BTL) OPTIME
TOPICAL | Status: DC | PRN
  Administered 2022-07-07: 1000 mL

## 2022-07-07 MED ORDER — LIDOCAINE 2% (20 MG/ML) 5 ML SYRINGE
INTRAMUSCULAR | Status: DC | PRN
  Administered 2022-07-07: 10 mg via INTRAVENOUS

## 2022-07-07 MED ORDER — OXYCODONE HCL 5 MG PO TABS: 5.0000 mg | ORAL_TABLET | Freq: Three times a day (TID) | ORAL | 0 refills | Status: DC | PRN

## 2022-07-07 MED ORDER — CHLORHEXIDINE GLUCONATE 0.12 % MT SOLN
15.0000 mL | Freq: Once | OROMUCOSAL | Status: AC
  Administered 2022-07-07: 15 mL via OROMUCOSAL
  Filled 2022-07-07: qty 15

## 2022-07-07 SURGICAL SUPPLY — 37 items
BAG COUNTER SPONGE SURGICOUNT (BAG) ×2 IMPLANT
BAG SPNG CNTER NS LX DISP (BAG)
BLADE LONG MED 31X9 (MISCELLANEOUS) IMPLANT
BNDG CMPR 5X3 KNIT ELC UNQ LF (GAUZE/BANDAGES/DRESSINGS)
BNDG CMPR 75X21 PLY HI ABS (MISCELLANEOUS)
BNDG CMPR 9X4 STRL LF SNTH (GAUZE/BANDAGES/DRESSINGS)
BNDG CMPR MED 10X6 ELC LF (GAUZE/BANDAGES/DRESSINGS) ×1
BNDG ELASTIC 3INX 5YD STR LF (GAUZE/BANDAGES/DRESSINGS) ×2 IMPLANT
BNDG ELASTIC 6X10 VLCR STRL LF (GAUZE/BANDAGES/DRESSINGS) IMPLANT
BNDG ESMARK 4X9 LF (GAUZE/BANDAGES/DRESSINGS) ×2 IMPLANT
BNDG GAUZE DERMACEA FLUFF 4 (GAUZE/BANDAGES/DRESSINGS) ×2 IMPLANT
BNDG GZE DERMACEA 4 6PLY (GAUZE/BANDAGES/DRESSINGS) ×1
CUFF TOURN SGL QUICK 18X4 (TOURNIQUET CUFF) IMPLANT
DRSG EMULSION OIL 3X3 NADH (GAUZE/BANDAGES/DRESSINGS) ×2 IMPLANT
DRSG XEROFORM 1X8 (GAUZE/BANDAGES/DRESSINGS) IMPLANT
DURAPREP 26ML APPLICATOR (WOUND CARE) ×2 IMPLANT
ELECT REM PT RETURN 9FT ADLT (ELECTROSURGICAL) ×1
ELECTRODE REM PT RTRN 9FT ADLT (ELECTROSURGICAL) ×2 IMPLANT
GAUZE SPONGE 4X4 12PLY STRL (GAUZE/BANDAGES/DRESSINGS) ×2 IMPLANT
GAUZE STRETCH 2X75IN STRL (MISCELLANEOUS) ×2 IMPLANT
GLOVE BIO SURGEON STRL SZ8 (GLOVE) ×4 IMPLANT
GOWN STRL REUS W/ TWL LRG LVL3 (GOWN DISPOSABLE) ×2 IMPLANT
GOWN STRL REUS W/ TWL XL LVL3 (GOWN DISPOSABLE) ×2 IMPLANT
GOWN STRL REUS W/TWL LRG LVL3 (GOWN DISPOSABLE) ×1
GOWN STRL REUS W/TWL XL LVL3 (GOWN DISPOSABLE) ×1
KIT BASIN OR (CUSTOM PROCEDURE TRAY) ×2 IMPLANT
NDL HYPO 25X1 1.5 SAFETY (NEEDLE) ×2 IMPLANT
NEEDLE HYPO 25X1 1.5 SAFETY (NEEDLE) ×1 IMPLANT
NS IRRIG 1000ML POUR BTL (IV SOLUTION) IMPLANT
PACK ORTHO EXTREMITY (CUSTOM PROCEDURE TRAY) ×2 IMPLANT
SUCTION FRAZIER HANDLE 10FR (MISCELLANEOUS)
SUCTION TUBE FRAZIER 10FR DISP (MISCELLANEOUS) ×2 IMPLANT
SUT PROLENE 3 0 PS 2 (SUTURE) ×2 IMPLANT
SYR 10ML LL (SYRINGE) IMPLANT
TUBE CONNECTING 12X1/4 (SUCTIONS) ×2 IMPLANT
UNDERPAD 30X36 HEAVY ABSORB (UNDERPADS AND DIAPERS) ×2 IMPLANT
YANKAUER SUCT BULB TIP NO VENT (SUCTIONS) IMPLANT

## 2022-07-07 NOTE — Anesthesia Procedure Notes (Signed)
Date/Time: 07/07/2022 2:10 PM  Performed by: Shary Decamp, CRNAPre-anesthesia Checklist: Patient identified, Emergency Drugs available, Suction available, Timeout performed and Patient being monitored Patient Re-evaluated:Patient Re-evaluated prior to induction Oxygen Delivery Method: Nasal Cannula

## 2022-07-07 NOTE — Anesthesia Procedure Notes (Signed)
Anesthesia Regional Block: Adductor canal block   Pre-Anesthetic Checklist: , timeout performed,  Correct Patient, Correct Site, Correct Laterality,  Correct Procedure, Correct Position, site marked,  Risks and benefits discussed,  Surgical consent,  Pre-op evaluation,  At surgeon's request and post-op pain management  Laterality: Right  Prep: Maximum Sterile Barrier Precautions used, chloraprep       Needles:  Injection technique: Single-shot  Needle Type: Echogenic Stimulator Needle     Needle Length: 9cm  Needle Gauge: 22     Additional Needles:   Procedures:,,,, ultrasound used (permanent image in chart),,    Narrative:  Start time: 07/07/2022 1:00 PM End time: 07/07/2022 1:05 PM Injection made incrementally with aspirations every 5 mL.  Performed by: Personally  Anesthesiologist: Lannie Fields, DO  Additional Notes: Monitors applied. No increased pain on injection. No increased resistance to injection. Injection made in 5cc increments. Good needle visualization. Patient tolerated procedure well.

## 2022-07-07 NOTE — Interval H&P Note (Signed)
History and Physical Interval Note:  07/07/2022 1:59 PM  Crystal Patterson  has presented today for surgery, with the diagnosis of GANGRENE.  The various methods of treatment have been discussed with the patient and family. After consideration of risks, benefits and other options for treatment, the patient has consented to  Procedure(s): AMPUTATION TOE (FRIST RIGHT TOE) (N/A) as a surgical intervention.  The patient's history has been reviewed, patient examined, no change in status, stable for surgery.  I have reviewed the patient's chart and labs.  Questions were answered to the patient's satisfaction.     [name]

## 2022-07-07 NOTE — Anesthesia Postprocedure Evaluation (Signed)
Anesthesia Post Note  Patient: Crystal Patterson  Procedure(s) Performed: AMPUTATION TOE (FRIST RIGHT TOE) (Right: Toe)     Patient location during evaluation: PACU Anesthesia Type: Regional and MAC Level of consciousness: awake and alert Pain management: pain level controlled Vital Signs Assessment: post-procedure vital signs reviewed and stable Respiratory status: spontaneous breathing, nonlabored ventilation and respiratory function stable Cardiovascular status: blood pressure returned to baseline and stable Postop Assessment: no apparent nausea or vomiting Anesthetic complications: no   No notable events documented.  Last Vitals:  Vitals:   07/07/22 1510 07/07/22 1515  BP:    Pulse:    Resp:  13  Temp:  (!) 36.1 C  SpO2: 97% 100%    Last Pain:  Vitals:   07/07/22 1515  PainSc: 0-No pain                 Lannie Fields

## 2022-07-07 NOTE — Progress Notes (Signed)
Orthopedic Tech Progress Note Patient Details:  Crystal Patterson 1965/07/12 [id number]  Ortho Devices Type of Ortho Device: Postop shoe/boot Ortho Device/Splint Location: RLE Ortho Device/Splint Interventions: Ordered, Application, Adjustment  PACU RN called for post OP shoe. Post Interventions Patient Tolerated: Well Instructions Provided: Adjustment of device, Care of device  [name] 07/07/2022, 3:52 PM

## 2022-07-07 NOTE — Op Note (Signed)
PATIENT:  Crystal Patterson  57 y.o. female   PRE-OPERATIVE DIAGNOSIS:  GANGRENE   POST-OPERATIVE DIAGNOSIS:  GANGRENE   PROCEDURE:  Procedure(s): AMPUTATION TOE (FRIST RIGHT TOE) (Right)   SURGEON:  Surgeon(s) and Role:    * [name], DPM - Primary   PHYSICIAN ASSISTANT:    ASSISTANTS: none    ANESTHESIA:   MAC   EBL:  minimal    BLOOD ADMINISTERED:none   DRAINS: none    LOCAL MEDICATIONS USED:  Lidocaine   , BUPIVICAINE , and Amount: 4 ml   SPECIMEN:  Source of Specimen:  right big toe   DISPOSITION OF SPECIMEN:  PATHOLOGY   COUNTS:  YES   TOURNIQUET:  * Missing tourniquet times found for documented tourniquets in log: [id number] *   DICTATION: .Dragon Dictation   PLAN OF CARE: Discharge to home after PACU   PATIENT DISPOSITION:  PACU - hemodynamically stable.   Delay start of Pharmacological VTE agent (>24hrs) due to surgical blood loss or risk of bleeding: no   Intraoperative findings: S/p right partial hallux amputation. Resection margin viable. No purulence.    Indications for surgery: 57 year old female presents to the office with a gangrenous changes to the right hallux.  Given her pain as well as gangrenous changes she was proceed with amputation of the toe.  Alternatives risks and complications were discussed.  No promises or guarantees given.  Procedure detail: The patient was both verbally and visually identified by myself, nursing staff, the anesthesia staff preoperatively.  Preoperative she had nerve block performed the incision site.  She was then transferred to the operating room via stretcher the surgery took place.  After an adequate plane of anesthesia obtained a timeout was performed an additional 4 mL of lidocaine, Marcaine was infiltrated in a digital block fashion.The right lower extremities and scrubbed, prepped and draped in normal sterile fashion.  Secondary timeout was performed.  Modified fishmouth incision was made along the right  hallux IPJ.  Incision was made with a #10 blade scalpel and skin to bone.  The toe was disarticulated over the IPJ. In order for there to be enough tissue for soft tissue closure I utilized a saw to resect the head of the proximal phalanx.  The resection margins appear viable.  No purulence or proximal tracking noted.  There was bleeding noted along the skin edges.  I copiously irrigated with saline hemostasis achieved.  Incision was closed with 3-0 Prolene.  Xeroform was applied followed by dressing with dressing.  She was woke up anesthesia and found.  Tolerated procedure well any complications.  Transferred to PACU without signs stable and vascular status intact.  Discussed the case with the patient's daughter today with permission from the patient.  She was discharged home with limited weightbearing in surgical shoe.  Elevation.  Keflex.

## 2022-07-07 NOTE — Anesthesia Procedure Notes (Signed)
Anesthesia Regional Block: Popliteal block   Pre-Anesthetic Checklist: , timeout performed,  Correct Patient, Correct Site, Correct Laterality,  Correct Procedure, Correct Position, site marked,  Risks and benefits discussed,  Surgical consent,  Pre-op evaluation,  At surgeon's request and post-op pain management  Laterality: Right  Prep: Maximum Sterile Barrier Precautions used, chloraprep       Needles:  Injection technique: Single-shot  Needle Type: Echogenic Stimulator Needle     Needle Length: 9cm  Needle Gauge: 22     Additional Needles:   Procedures:,,,, ultrasound used (permanent image in chart),,    Narrative:  Start time: 07/07/2022 12:55 PM End time: 07/07/2022 1:00 PM Injection made incrementally with aspirations every 5 mL.  Performed by: Personally  Anesthesiologist: Lannie Fields, DO  Additional Notes: Monitors applied. No increased pain on injection. No increased resistance to injection. Injection made in 5cc increments. Good needle visualization. Patient tolerated procedure well.

## 2022-07-07 NOTE — Brief Op Note (Signed)
07/07/2022  2:45 PM  PATIENT:  Crystal Patterson  57 y.o. female  PRE-OPERATIVE DIAGNOSIS:  GANGRENE  POST-OPERATIVE DIAGNOSIS:  GANGRENE  PROCEDURE:  Procedure(s): AMPUTATION TOE (FRIST RIGHT TOE) (Right)  SURGEON:  Surgeon(s) and Role:    * [name], DPM - Primary  PHYSICIAN ASSISTANT:   ASSISTANTS: none   ANESTHESIA:   MAC  EBL:  minimal   BLOOD ADMINISTERED:none  DRAINS: none   LOCAL MEDICATIONS USED:  Lidocaine   , BUPIVICAINE , and Amount: 4 ml  SPECIMEN:  Source of Specimen:  right big toe  DISPOSITION OF SPECIMEN:  PATHOLOGY  COUNTS:  YES  TOURNIQUET:  * Missing tourniquet times found for documented tourniquets in log: [id number] *  DICTATION: .Dragon Dictation  PLAN OF CARE: Discharge to home after PACU  PATIENT DISPOSITION:  PACU - hemodynamically stable.   Delay start of Pharmacological VTE agent (>24hrs) due to surgical blood loss or risk of bleeding: no  Intraoperative findings: S/p right partial hallux amputation. Resection margin viable. No purulence.

## 2022-07-07 NOTE — Discharge Instructions (Signed)
See written instructions Resume all home medication as prior to surgery Limit the amount of weight bearing on the foot and wear the surgical shoe at all times. Elevate the foot, do not ice it.

## 2022-07-07 NOTE — Transfer of Care (Signed)
Immediate Anesthesia Transfer of Care Note  Patient: Crystal Patterson  Procedure(s) Performed: AMPUTATION TOE (FRIST RIGHT TOE) (Right: Toe)  Patient Location: PACU  Anesthesia Type:MAC combined with regional for post-op pain  Level of Consciousness: drowsy, patient cooperative, and responds to stimulation  Airway & Oxygen Therapy: Patient Spontanous Breathing and Patient connected to face mask oxygen  Post-op Assessment: Report given to RN and Post -op Vital signs reviewed and stable  Post vital signs: Reviewed and stable  Last Vitals:  Vitals Value Taken Time  BP    Temp    Pulse 42 07/07/22 1447  Resp 14 07/07/22 1447  SpO2 100 % 07/07/22 1447  Vitals shown include unvalidated device data.  Last Pain: There were no vitals filed for this visit.    Patients Stated Pain Goal: 0 (07/07/22 1142)  Complications: No notable events documented.

## 2022-07-08 ENCOUNTER — Encounter (HOSPITAL_COMMUNITY): Payer: Self-pay | Admitting: Podiatry

## 2022-07-12 LAB — SURGICAL PATHOLOGY

## 2022-07-13 ENCOUNTER — Encounter: Payer: 59 | Admitting: Podiatry

## 2022-07-15 ENCOUNTER — Ambulatory Visit (INDEPENDENT_AMBULATORY_CARE_PROVIDER_SITE_OTHER): Payer: 59 | Admitting: Podiatry

## 2022-07-15 ENCOUNTER — Ambulatory Visit (INDEPENDENT_AMBULATORY_CARE_PROVIDER_SITE_OTHER): Payer: 59

## 2022-07-15 DIAGNOSIS — I96 Gangrene, not elsewhere classified: Secondary | ICD-10-CM

## 2022-07-18 NOTE — Progress Notes (Signed)
Subjective: Chief Complaint  Patient presents with   Routine Post Op    Rm 13  POV #1 DOS 07/07/2022 RT BIG TOE AMPUTATION. Pt states she is in a lot of pain due to a fall she had on May 14. Pt states she hit the bottom of foot when she fell.  No active bleeding. No nv, fever or chills.  Pt still taking and has her pain medication.    Tinea Pedis    Crystal Patterson is a 57 y.o. status post partial right hallux amputation.  She presents by herself.  She states that she did hit the toe after surgery and had some bleeding on the bandage.  No other injuries.  No fevers or chills.  In general she still getting pain in the lower extremity.  Objective: General: No acute distress, AAOx3  Therapy to be warm and well-perfused. Right foot: Incision is well coapted without any evidence of dehiscence.  There is some dried blood present.  There is mild edema there is no erythema or warmth.  There is no signs of infection.  Still been diffuse mild tenderness.  No active bleeding.    No other open lesions or pre-ulcerative lesions.  No pain with calf compression, swelling, warmth, erythema.   Assessment and Plan:  Status post partial toe amputation  -Treatment options discussed including all alternatives, risks, and complications -X-rays were obtained reviewed.  3 views of the foot were obtained.  Status post partial hallux amputation.  Amputation site appears to be sharp and no cortical changes to suggest osteomyelitis.  No subacute fracture otherwise -Serpe was applied followed by dressing.  Keep dressing clean, dry, intact.  Encouraged elevation.  I think some of the pain is probably vascular in nature  (possibly from neuropathy as well) as she was having this pain prior to surgery for amputation.  She still taking the medication.  [name] DPM

## 2022-07-20 ENCOUNTER — Encounter: Payer: 59 | Admitting: Podiatry

## 2022-08-03 ENCOUNTER — Encounter: Payer: 59 | Admitting: Podiatry

## 2022-08-04 ENCOUNTER — Other Ambulatory Visit: Payer: Self-pay | Admitting: *Deleted

## 2022-08-04 DIAGNOSIS — I70235 Atherosclerosis of native arteries of right leg with ulceration of other part of foot: Secondary | ICD-10-CM

## 2022-08-06 ENCOUNTER — Ambulatory Visit (INDEPENDENT_AMBULATORY_CARE_PROVIDER_SITE_OTHER): Payer: 59 | Admitting: Podiatry

## 2022-08-06 DIAGNOSIS — I96 Gangrene, not elsewhere classified: Secondary | ICD-10-CM | POA: Diagnosis not present

## 2022-08-12 NOTE — Progress Notes (Signed)
Subjective: Chief Complaint  Patient presents with   Routine Post Op    Rm 11 POV #2 DOS 07/07/2022 RT BIG TOE AMPUTATION. Pt states she still has pain and edema. Possible suture removal.      ARINE FOLEY is a 57 y.o. status post partial right hallux amputation.  She presents today for possible suture removal.  Still having pain in her foot, leg.  No fevers or chills.  No other concerns.   Objective: General: No acute distress, AAOx3  Therapy to be warm and well-perfused. Right foot: Incision is well coapted without any evidence of dehiscence.  Sutures are intact.  Upon removal incisions still well coapted.  There is is trace edema.  There is no erythema or warmth.  There is no fluctuation, crepitation, malodor.   No other open lesions or pre-ulcerative lesions.  No pain with calf compression, swelling, warmth, erythema.   Assessment and Plan:  Status post partial toe amputation  -Treatment options discussed including all alternatives, risks, and complications -Sutures removed today without complications.  Patient appears to be healing well.  Some antibiotic was applied followed by dressing.  Discussed dressing changes and she feels comfortable with this herself.  Continue offloading. -Monitor for any clinical signs or symptoms of infection and directed to call the office immediately should any occur or go to the ER.  Return for 3-4 weeks for post-op s/p toe amputation .  [name] DPM

## 2022-08-13 ENCOUNTER — Ambulatory Visit (HOSPITAL_COMMUNITY): Payer: 59

## 2022-08-19 ENCOUNTER — Ambulatory Visit (HOSPITAL_COMMUNITY): Payer: 59

## 2022-09-06 ENCOUNTER — Ambulatory Visit (HOSPITAL_COMMUNITY): Admission: RE | Admit: 2022-09-06 | Payer: 59 | Source: Ambulatory Visit

## 2022-09-06 ENCOUNTER — Encounter: Payer: 59 | Admitting: Podiatry

## 2022-09-06 ENCOUNTER — Ambulatory Visit (HOSPITAL_COMMUNITY): Payer: 59

## 2022-09-08 NOTE — Progress Notes (Signed)
HISTORY AND PHYSICAL     CC:  follow up. Requesting Provider:  Benetta Spar*  HPI: This is a 57 y.o. female who is here today for follow up and has hx of aortogram drug eluting stenting of the SFA, balloon angioplasty of the PTA on 06/30/2022 for critical limb ischemia with tissue loss by Dr. Karin Lieu.   The pt returns today for follow up.  Unfortunately, she did not arrive in time for her arterial studies and there are no openings for this.  She states that she does not have any pain in the right foot.  She does not have any pain in her foot at night.   She does not walk to elicit claudication.  She states she does have some numbness in the right foot.  She is not a great historian but sounds as if the numbness was present prior to her intervention.  She states that her toe amp is trying to heal.  She has not been back to see Dr. Ardelle Anton.  She has multiple finger amputations and is going to have another finger amputated later this month and she tells me this is due to Lupus.  She states she is not smoking and quit on August 30, 2022.  The pt is on a statin for cholesterol management.    The pt is on an aspirin.    Other AC:  Plavix The pt is on CCB, BB, ACEI for hypertension.  The pt is not on medication for diabetes. Tobacco hx:  former    Past Medical History:  Diagnosis Date   Anginal pain (HCC) 2020   Arthritis    Knees, hips,   CHF (congestive heart failure) (HCC)    Chronic kidney disease    COVID    mild   Elevated troponin 05/24/2016   Finger amputation, no complication    GERD (gastroesophageal reflux disease)    Gout    Heart murmur    never has caused any problems per patient 04/02/20   Hypercholesteremia    Hypertension    Infection of total left knee replacement (HCC) 01/25/2020   Lupus (HCC)    Myocardial infarction [facility])    Peripheral vascular disease (HCC)     Past Surgical History:  Procedure Laterality Date   ABDOMINAL AORTOGRAM W/LOWER EXTREMITY N/A  06/30/2022   Procedure: ABDOMINAL AORTOGRAM W/LOWER EXTREMITY;  Surgeon: Victorino Sparrow, MD;  Location: [facility] INVASIVE CV LAB;  Service: Cardiovascular;  Laterality: N/A;   AMPUTATION Left 04/04/2020   Procedure: LEFT ABOVE KNEE AMPUTATION;  Surgeon: Nadara Mustard, MD;  Location: [facility] OR;  Service: Orthopedics;  Laterality: Left;   AMPUTATION TOE Right 07/07/2022   Procedure: AMPUTATION TOE (FRIST RIGHT TOE);  Surgeon: [name], DPM;  Location: [facility] OR;  Service: Podiatry;  Laterality: Right;   CARDIAC CATHETERIZATION  05/25/2016   COLONOSCOPY N/A 01/17/2019   Procedure: COLONOSCOPY;  Surgeon: Corbin Ade, MD;  Location: [facility] ENDO SUITE;  Service: Endoscopy;  Laterality: N/A;  10:30   EXCISIONAL TOTAL KNEE ARTHROPLASTY Left 02/05/2020   Procedure: REPEAT IRRIGATION AND DEBRIDEMENT LEFT KNEE;  Surgeon: [name], MD;  Location: [facility] ORS;  Service: Orthopedics;  Laterality: Left;   I & D EXTREMITY Left 02/08/2020   Procedure: EXCISIONAL DEBRIDEMENT LEFT KNEE;  Surgeon: Nadara Mustard, MD;  Location: [facility] OR;  Service: Orthopedics;  Laterality: Left;   I & D KNEE WITH POLY EXCHANGE Left 01/25/2020   Procedure: INCISION AND DRAINAGE LEFT KNEE WITH  POLY-LINER EXCHANGE;  Surgeon: [name], MD;  Location: [facility] ORS;  Service: Orthopedics;  Laterality: Left;   IRRIGATION AND DEBRIDEMENT KNEE Left 01/30/2020   Procedure: REPEAT IRRIGATION AND DEBRIDEMENT LEFT KNEE PLACEMENT OF WOUND VAC;  Surgeon: [name], MD;  Location: [facility] ORS;  Service: Orthopedics;  Laterality: Left;   left finger amputations     PERIPHERAL VASCULAR INTERVENTION  06/30/2022   Procedure: PERIPHERAL VASCULAR INTERVENTION;  Surgeon: Victorino Sparrow, MD;  Location: [facility] INVASIVE CV LAB;  Service: Cardiovascular;;   POLYPECTOMY  01/17/2019   Procedure: POLYPECTOMY;  Surgeon: Corbin Ade, MD;  Location: [facility];  Service: Endoscopy;;   right hand surgery     right knee arthroscopy      RIGHT/LEFT HEART CATH AND CORONARY ANGIOGRAPHY N/A 05/25/2016   Procedure: Right/Left Heart Cath and Coronary Angiography;  Surgeon: Lennette Bihari, MD;  Location: [facility] INVASIVE CV LAB;  Service: Cardiovascular;  Laterality: N/A;   SKIN SPLIT GRAFT Left 02/08/2020   Procedure: SKIN GRAFT SPLIT THICKNESS LEFT KNEE;  Surgeon: Nadara Mustard, MD;  Location: [facility] OR;  Service: Orthopedics;  Laterality: Left;   TOTAL KNEE ARTHROPLASTY Left 01/04/2020   Procedure: LEFT TOTAL KNEE ARTHROPLASTY;  Surgeon: [name], MD;  Location: [facility] ORS;  Service: Orthopedics;  Laterality: Left;    No Known Allergies  Current Outpatient Medications  Medication Sig Dispense Refill   allopurinol (ZYLOPRIM) 100 MG tablet Take 1 tablet (100 mg total) by mouth daily. 30 tablet 0   amLODipine (NORVASC) 10 MG tablet Take 10 mg by mouth daily.     aspirin EC 81 MG tablet Take 81 mg by mouth daily. Swallow whole.     atenolol (TENORMIN) 25 MG tablet Take 1 tablet (25 mg total) by mouth daily. 30 tablet 0   atorvastatin (LIPITOR) 40 MG tablet Take 1 tablet (40 mg total) by mouth daily at 6 PM. 30 tablet 0   benazepril (LOTENSIN) 10 MG tablet Take 1 tablet (10 mg total) by mouth daily. (Patient not taking: Reported on 06/29/2022) 30 tablet 0   benazepril (LOTENSIN) 40 MG tablet Take 40 mg by mouth daily.     cephALEXin (KEFLEX) 500 MG capsule Take 1 capsule (500 mg total) by mouth 3 (three) times daily. 21 capsule 0   clopidogrel (PLAVIX) 75 MG tablet Take 1 tablet (75 mg total) by mouth daily. 30 tablet 11   Ensure (ENSURE) Take 237 mLs by mouth daily as needed (supplementation).     escitalopram (LEXAPRO) 10 MG tablet Take 1 tablet (10 mg total) by mouth daily. (Patient not taking: Reported on 06/29/2022) 30 tablet 0   furosemide (LASIX) 40 MG tablet Take 1 tablet (40 mg total) by mouth daily. 30 tablet 0   gabapentin (NEURONTIN) 300 MG capsule Take 1 capsule (300 mg total) by mouth daily. (Patient taking differently:  Take 300 mg by mouth 2 (two) times daily.) 30 capsule 0   ibuprofen (ADVIL) 800 MG tablet Take 800 mg by mouth 2 (two) times daily as needed for moderate pain.     omeprazole (PRILOSEC) 20 MG capsule Take 1 capsule (20 mg total) by mouth daily. 30 capsule 0   oxyCODONE (OXY IR/ROXICODONE) 5 MG immediate release tablet Take 1 tablet (5 mg total) by mouth every 8 (eight) hours as needed for severe pain. 30 tablet 0   oxyCODONE (ROXICODONE) 5 MG immediate release tablet Take 1 tablet (5 mg total) by mouth every 8 (eight) hours as  needed for severe pain. 20 tablet 0   potassium chloride (KLOR-CON) 10 MEQ tablet Take 1 tablet (10 mEq total) by mouth daily. 30 tablet 0   traZODone (DESYREL) 100 MG tablet Take 150 mg by mouth at bedtime as needed for sleep.     No current facility-administered medications for this visit.    Family History  Problem Relation Age of Onset   Pancreatic cancer Mother    Colon cancer Father     Social History   Socioeconomic History   Marital status: Single    Spouse name: Not on file   Number of children: Not on file   Years of education: Not on file   Highest education level: Not on file  Occupational History   Occupation: unemployed  Tobacco Use   Smoking status: Every Day    Packs/day: 0.25    Years: 15.00    Additional pack years: 0.00    Total pack years: 3.75    Types: Cigarettes   Smokeless tobacco: Never  Vaping Use   Vaping Use: Never used  Substance and Sexual Activity   Alcohol use: Yes    Alcohol/week: 2.0 - 3.0 standard drinks of alcohol    Types: 2 - 3 Cans of beer per week    Comment: daily   Drug use: Yes    Types: Marijuana    Comment: Last time 04/02/20   Sexual activity: Not Currently    Birth control/protection: Post-menopausal  Other Topics Concern   Not on file  Social History Narrative   Patient has been living alone; however, plans to discharge home to sister, Janice's, house   Social Determinants of Health   Financial  Resource Strain: Not on file  Food Insecurity: Not on file  Transportation Needs: Not on file  Physical Activity: Not on file  Stress: Not on file  Social Connections: Not on file  Intimate Partner Violence: Not on file     REVIEW OF SYSTEMS:   [X]  denotes positive finding, [ ]  denotes negative finding Cardiac  Comments:  Chest pain or chest pressure:    Shortness of breath upon exertion:    Short of breath when lying flat:    Irregular heart rhythm:        Vascular    Pain in calf, thigh, or hip brought on by ambulation:    Pain in feet at night that wakes you up from your sleep:     Blood clot in your veins:    Leg swelling:         Pulmonary    Oxygen at home:    Productive cough:     Wheezing:         Neurologic    Sudden weakness in arms or legs:     Sudden numbness in arms or legs:     Sudden onset of difficulty speaking or slurred speech:    Temporary loss of vision in one eye:     Problems with dizziness:         Gastrointestinal    Blood in stool:     Vomited blood:         Genitourinary    Burning when urinating:     Blood in urine:        Psychiatric    Major depression:         Hematologic    Bleeding problems:    Problems with blood clotting too easily:        Skin  Rashes or ulcers:        Constitutional    Fever or chills:      PHYSICAL EXAMINATION:  Today's Vitals   09/10/22 1031  BP: 92/65  Temp: 97.6 F (36.4 C)  TempSrc: Temporal  PainSc: 4    There is no height or weight on file to calculate BMI.   General:  WDWN in NAD; vital signs documented above Gait: Not observed HENT: WNL, normocephalic Pulmonary: normal non-labored breathing , without wheezing Cardiac: regular HR, without carotid bruits Skin: without rashes Vascular Exam/Pulses: +brisk right popliteal doppler signal.  Unable to obtain PT/DP/peroneal doppler signals.  Extremities: toe amputation site as below.  Multiple finger amputations.  Unable to palpate  radial pulses.   Musculoskeletal: no muscle wasting or atrophy  Neurologic: A&O X 3 Psychiatric:  The pt has Normal affect.   Non-Invasive Vascular Imaging:   ABI's/TBI's and arterial duplex on 09/10/2022: -not done due to being late    ASSESSMENT/PLAN:: 57 y.o. female here for follow up for PAD with hx of aortogram drug eluting stenting of the SFA, balloon angioplasty of the PTA on 06/30/2022 for critical limb ischemia with tissue loss by Dr. Karin Lieu.    -due to transportation, she was unable to get her arterial studies today.  I do not hear any doppler signals in the right foot.  She does have a popliteal signal.  She is not having any rest pain.  Her toe appears stable -she will return next week for arterial studies and ABI and determine if she needs arteriogram.  -she is compliant with her asa/statin/plavix -she has hx of multiple finger amputations due to Lupus and these have healed.  She does have a finger that she tells me needs to be amputated with plans to do this later this month.  I cannot palpate radial pulses.   -continue asa/statin/plavix -fortunately she quit smoking recently.    Doreatha Massed, [facility] [phone]  Clinic MD:   Karin Lieu

## 2022-09-10 ENCOUNTER — Ambulatory Visit (INDEPENDENT_AMBULATORY_CARE_PROVIDER_SITE_OTHER): Payer: 59 | Admitting: Physician Assistant

## 2022-09-10 ENCOUNTER — Ambulatory Visit (HOSPITAL_COMMUNITY): Payer: 59

## 2022-09-10 VITALS — BP 92/65 | Temp 97.6°F

## 2022-09-10 DIAGNOSIS — I70245 Atherosclerosis of native arteries of left leg with ulceration of other part of foot: Secondary | ICD-10-CM

## 2022-09-11 ENCOUNTER — Telehealth: Payer: Self-pay | Admitting: Podiatry

## 2022-09-11 NOTE — Telephone Encounter (Signed)
She missed her POV this past week. Can we call her to reschedule? Thanks!

## 2022-09-14 NOTE — Telephone Encounter (Signed)
Pt called and we had to change appt to 7.25 as pt takes [facility] transportation and they could not bring pt this week

## 2022-09-14 NOTE — Telephone Encounter (Signed)
Pt called back and is scheduled to see Dr Ardelle Anton 7.18.2024

## 2022-09-16 ENCOUNTER — Encounter: Payer: 59 | Admitting: Podiatry

## 2022-09-17 ENCOUNTER — Ambulatory Visit (INDEPENDENT_AMBULATORY_CARE_PROVIDER_SITE_OTHER)
Admission: RE | Admit: 2022-09-17 | Discharge: 2022-09-17 | Disposition: A | Discharge status: Home / Self Care | Payer: 59 | Source: Ambulatory Visit | Attending: Physician Assistant | Admitting: Physician Assistant

## 2022-09-17 ENCOUNTER — Ambulatory Visit (INDEPENDENT_AMBULATORY_CARE_PROVIDER_SITE_OTHER): Payer: 59 | Admitting: Physician Assistant

## 2022-09-17 ENCOUNTER — Ambulatory Visit (HOSPITAL_COMMUNITY)
Admission: RE | Admit: 2022-09-17 | Discharge: 2022-09-17 | Disposition: A | Discharge status: Home / Self Care | Payer: 59 | Source: Ambulatory Visit | Attending: Physician Assistant | Admitting: Physician Assistant

## 2022-09-17 VITALS — BP 123/87 | HR 91 | Temp 97.5°F | Resp 16 | Ht 64.0 in | Wt 145.0 lb

## 2022-09-17 DIAGNOSIS — I70245 Atherosclerosis of native arteries of left leg with ulceration of other part of foot: Secondary | ICD-10-CM

## 2022-09-17 DIAGNOSIS — F17200 Nicotine dependence, unspecified, uncomplicated: Secondary | ICD-10-CM

## 2022-09-17 DIAGNOSIS — I70235 Atherosclerosis of native arteries of right leg with ulceration of other part of foot: Secondary | ICD-10-CM | POA: Insufficient documentation

## 2022-09-17 NOTE — Progress Notes (Signed)
Office Note     CC:  follow up Requesting Provider:  Benetta Spar*  HPI: Crystal Patterson is a 57 y.o. (02/03/1966) female who presents for surveillance of PAD.  She is s/p aortogram drug eluting stenting of the SFA, balloon angioplasty of the PTA on 06/30/2022 for critical limb ischemia with tissue loss by Dr. Karin Lieu.  She underwent R partial GT amputation by Dr. Ardelle Anton of Podiatry.  She believes GT amp site is healing well.  She denies rest pain in her R foot.  She is taking aspirin, plaivx, statin daily.  She is down to about 2 cigarettes a day and is trying to quit.  She has a follow up with her Podiatrist later this month.  Surgical history also significant for L AKA.  Past Medical History:  Diagnosis Date   Anginal pain (HCC) 2020   Arthritis    Knees, hips,   CHF (congestive heart failure) (HCC)    Chronic kidney disease    COVID    mild   Elevated troponin 05/24/2016   Finger amputation, no complication    GERD (gastroesophageal reflux disease)    Gout    Heart murmur    never has caused any problems per patient 04/02/20   Hypercholesteremia    Hypertension    Infection of total left knee replacement (HCC) 01/25/2020   Lupus (HCC)    Myocardial infarction [facility])    Peripheral vascular disease (HCC)     Past Surgical History:  Procedure Laterality Date   ABDOMINAL AORTOGRAM W/LOWER EXTREMITY N/A 06/30/2022   Procedure: ABDOMINAL AORTOGRAM W/LOWER EXTREMITY;  Surgeon: Victorino Sparrow, MD;  Location: [facility] INVASIVE CV LAB;  Service: Cardiovascular;  Laterality: N/A;   AMPUTATION Left 04/04/2020   Procedure: LEFT ABOVE KNEE AMPUTATION;  Surgeon: Nadara Mustard, MD;  Location: [facility] OR;  Service: Orthopedics;  Laterality: Left;   AMPUTATION TOE Right 07/07/2022   Procedure: AMPUTATION TOE (FRIST RIGHT TOE);  Surgeon: [name], DPM;  Location: [facility] OR;  Service: Podiatry;  Laterality: Right;   CARDIAC CATHETERIZATION  05/25/2016   COLONOSCOPY N/A 01/17/2019   Procedure:  COLONOSCOPY;  Surgeon: Corbin Ade, MD;  Location: [facility] SUITE;  Service: Endoscopy;  Laterality: N/A;  10:30   EXCISIONAL TOTAL KNEE ARTHROPLASTY Left 02/05/2020   Procedure: REPEAT IRRIGATION AND DEBRIDEMENT LEFT KNEE;  Surgeon: [name], MD;  Location: [facility] ORS;  Service: Orthopedics;  Laterality: Left;   I & D EXTREMITY Left 02/08/2020   Procedure: EXCISIONAL DEBRIDEMENT LEFT KNEE;  Surgeon: Nadara Mustard, MD;  Location: [facility] OR;  Service: Orthopedics;  Laterality: Left;   I & D KNEE WITH POLY EXCHANGE Left 01/25/2020   Procedure: INCISION AND DRAINAGE LEFT KNEE WITH POLY-LINER EXCHANGE;  Surgeon: [name], MD;  Location: [facility] ORS;  Service: Orthopedics;  Laterality: Left;   IRRIGATION AND DEBRIDEMENT KNEE Left 01/30/2020   Procedure: REPEAT IRRIGATION AND DEBRIDEMENT LEFT KNEE PLACEMENT OF WOUND VAC;  Surgeon: [name], MD;  Location: [facility] ORS;  Service: Orthopedics;  Laterality: Left;   left finger amputations     PERIPHERAL VASCULAR INTERVENTION  06/30/2022   Procedure: PERIPHERAL VASCULAR INTERVENTION;  Surgeon: Victorino Sparrow, MD;  Location: [facility] INVASIVE CV LAB;  Service: Cardiovascular;;   POLYPECTOMY  01/17/2019   Procedure: POLYPECTOMY;  Surgeon: Corbin Ade, MD;  Location: [facility];  Service: Endoscopy;;   right hand surgery     right knee arthroscopy     RIGHT/LEFT  HEART CATH AND CORONARY ANGIOGRAPHY N/A 05/25/2016   Procedure: Right/Left Heart Cath and Coronary Angiography;  Surgeon: Lennette Bihari, MD;  Location: [facility] INVASIVE CV LAB;  Service: Cardiovascular;  Laterality: N/A;   SKIN SPLIT GRAFT Left 02/08/2020   Procedure: SKIN GRAFT SPLIT THICKNESS LEFT KNEE;  Surgeon: Nadara Mustard, MD;  Location: [facility] OR;  Service: Orthopedics;  Laterality: Left;   TOTAL KNEE ARTHROPLASTY Left 01/04/2020   Procedure: LEFT TOTAL KNEE ARTHROPLASTY;  Surgeon: [name], MD;  Location: [facility] ORS;  Service: Orthopedics;  Laterality: Left;     Social History   Socioeconomic History   Marital status: Single    Spouse name: Not on file   Number of children: Not on file   Years of education: Not on file   Highest education level: Not on file  Occupational History   Occupation: unemployed  Tobacco Use   Smoking status: Former    Current packs/day: 0.00    Average packs/day: 0.3 packs/day for 15.0 years (3.8 ttl pk-yrs)    Types: Cigarettes    Quit date: 08/30/2022    Years since quitting: 0.0   Smokeless tobacco: Never  Vaping Use   Vaping status: Never Used  Substance and Sexual Activity   Alcohol use: Yes    Alcohol/week: 2.0 - 3.0 standard drinks of alcohol    Types: 2 - 3 Cans of beer per week    Comment: daily   Drug use: Yes    Types: Marijuana    Comment: Last time 04/02/20   Sexual activity: Not Currently    Birth control/protection: Post-menopausal  Other Topics Concern   Not on file  Social History Narrative   Patient has been living alone; however, plans to discharge home to sister, Theador Hawthorne, house   Social Determinants of Health   Financial Resource Strain: Low Risk  (04/22/2022)   Received from [facility]   Overall Financial Resource Strain (CARDIA)    Difficulty of Paying Living Expenses: Not hard at all  Food Insecurity: No Food Insecurity (10/26/2018)   Received from [facility], [facility]   Hunger Vital Sign    Worried About Running Out of Food in the Last Year: Never true    Ran Out of Food in the Last Year: Never true  Transportation Needs: Unknown (10/26/2018)   Received from [facility], [facility]   [facility] - Transportation    Lack of Transportation (Medical): Not on file    Lack of Transportation (Non-Medical): No  Physical Activity: Not on file  Stress: No Stress Concern Present (04/22/2022)   Received from [facility] - Occupational Stress Questionnaire    Feeling of Stress : Not at all  Social Connections: Unknown  (04/22/2022)   Received from [facility]   Social Network    Social Network: Not on file  Intimate Partner Violence: Unknown (04/22/2022)   Received from [facility]   HITS    Physically Hurt: Not on file    Insult or Talk Down To: Not on file    Threaten Physical Harm: Not on file    Scream or Curse: Not on file    Family History  Problem Relation Age of Onset   Pancreatic cancer Mother    Colon cancer Father     Current Outpatient Medications  Medication Sig Dispense Refill   allopurinol (ZYLOPRIM) 100 MG tablet Take 1 tablet (100 mg total) by mouth daily. 30  tablet 0   amLODipine (NORVASC) 10 MG tablet Take 10 mg by mouth daily.     aspirin EC 81 MG tablet Take 81 mg by mouth daily. Swallow whole.     atenolol (TENORMIN) 25 MG tablet Take 1 tablet (25 mg total) by mouth daily. 30 tablet 0   atorvastatin (LIPITOR) 40 MG tablet Take 1 tablet (40 mg total) by mouth daily at 6 PM. 30 tablet 0   benazepril (LOTENSIN) 40 MG tablet Take 40 mg by mouth daily.     cephALEXin (KEFLEX) 500 MG capsule Take 1 capsule (500 mg total) by mouth 3 (three) times daily. 21 capsule 0   clopidogrel (PLAVIX) 75 MG tablet Take 1 tablet (75 mg total) by mouth daily. 30 tablet 11   Ensure (ENSURE) Take 237 mLs by mouth daily as needed (supplementation).     furosemide (LASIX) 40 MG tablet Take 1 tablet (40 mg total) by mouth daily. 30 tablet 0   gabapentin (NEURONTIN) 300 MG capsule Take 1 capsule (300 mg total) by mouth daily. (Patient taking differently: Take 300 mg by mouth 2 (two) times daily.) 30 capsule 0   ibuprofen (ADVIL) 800 MG tablet Take 800 mg by mouth 2 (two) times daily as needed for moderate pain.     omeprazole (PRILOSEC) 20 MG capsule Take 1 capsule (20 mg total) by mouth daily. 30 capsule 0   potassium chloride (KLOR-CON) 10 MEQ tablet Take 1 tablet (10 mEq total) by mouth daily. 30 tablet 0   traZODone (DESYREL) 100 MG tablet Take 150 mg by mouth at bedtime as needed for sleep.      No current facility-administered medications for this visit.    No Known Allergies   REVIEW OF SYSTEMS:   [X]  denotes positive finding, [ ]  denotes negative finding Cardiac  Comments:  Chest pain or chest pressure:    Shortness of breath upon exertion:    Short of breath when lying flat:    Irregular heart rhythm:        Vascular    Pain in calf, thigh, or hip brought on by ambulation:    Pain in feet at night that wakes you up from your sleep:     Blood clot in your veins:    Leg swelling:         Pulmonary    Oxygen at home:    Productive cough:     Wheezing:         Neurologic    Sudden weakness in arms or legs:     Sudden numbness in arms or legs:     Sudden onset of difficulty speaking or slurred speech:    Temporary loss of vision in one eye:     Problems with dizziness:         Gastrointestinal    Blood in stool:     Vomited blood:         Genitourinary    Burning when urinating:     Blood in urine:        Psychiatric    Major depression:         Hematologic    Bleeding problems:    Problems with blood clotting too easily:        Skin    Rashes or ulcers:        Constitutional    Fever or chills:      PHYSICAL EXAMINATION:  Vitals:   09/17/22 0909  BP: 123/87  Pulse: 91  Resp: 16  Temp: (!) 97.5 F (36.4 C)  TempSrc: Temporal  SpO2: 95%  Weight: 145 lb (65.8 kg)  Height: 5\' 4"  (1.626 m)    General:  WDWN in NAD; vital signs documented above Gait: Not observed HENT: WNL, normocephalic Pulmonary: normal non-labored breathing , without Rales, rhonchi,  wheezing Cardiac: regular HR Abdomen: soft, NT, no masses Skin: without rashes Vascular Exam/Pulses: brisk R PT, soft DP by doppler  Extremities: toe amp site pictured below Musculoskeletal: no muscle wasting or atrophy  Neurologic: A&O X 3 Psychiatric:  The pt has Normal affect.    Non-Invasive Vascular Imaging:   Per ultrasonographer, R SFA stent patent on duplex without  elevated or low velocities throughout  R ABI 1.13 with biphasic waveforms   ASSESSMENT/PLAN:: 57 y.o. female here for follow up for surveillance of PAD with history of R SFA stenting  Partial R GT amp improving, R SFA stent patent, multiphasic PT, monophasic DP, f/u with podiatry, repeat RLE art duplex and ABI in 3 months  -R GT amp site healing well.  She will follow up with her Podiatrist later this month.  Arterial duplex demonstrates a widely patent stent with ABI of 1.13 and biphasic waveforms at the ankle.  Continue current wound care.  Continue aspirin, plavix, and statin daily.  Stressed importance of smoking cessation especially for wound healing.  We will repeat her studies in 3 months.  She will notify our office if wound fails to heal or worsens.   Emilie Rutter, PA-C [facility] [phone]  Clinic MD:   Karin Lieu

## 2022-09-20 LAB — VAS US ABI WITH/WO TBI: Right ABI: 1.1

## 2022-09-22 ENCOUNTER — Other Ambulatory Visit: Payer: Self-pay

## 2022-09-22 DIAGNOSIS — I70235 Atherosclerosis of native arteries of right leg with ulceration of other part of foot: Secondary | ICD-10-CM

## 2022-09-23 ENCOUNTER — Encounter: Payer: 59 | Admitting: Podiatry

## 2022-12-24 ENCOUNTER — Encounter (HOSPITAL_COMMUNITY): Payer: 59

## 2022-12-24 ENCOUNTER — Ambulatory Visit: Payer: 59

## 2022-12-30 ENCOUNTER — Ambulatory Visit (HOSPITAL_COMMUNITY): Payer: 59 | Attending: Vascular Surgery

## 2022-12-30 ENCOUNTER — Ambulatory Visit: Payer: 59

## 2022-12-30 ENCOUNTER — Ambulatory Visit (HOSPITAL_COMMUNITY): Admission: RE | Admit: 2022-12-30 | Payer: 59 | Source: Ambulatory Visit

## 2023-03-17 ENCOUNTER — Ambulatory Visit (HOSPITAL_COMMUNITY): Payer: 59

## 2023-03-17 ENCOUNTER — Ambulatory Visit: Payer: 59

## 2023-04-08 DIAGNOSIS — Z89612 Acquired absence of left leg above knee: Secondary | ICD-10-CM | POA: Diagnosis not present

## 2023-04-08 DIAGNOSIS — I10 Essential (primary) hypertension: Secondary | ICD-10-CM | POA: Diagnosis not present

## 2023-04-13 DIAGNOSIS — I742 Embolism and thrombosis of arteries of the upper extremities: Secondary | ICD-10-CM | POA: Diagnosis not present

## 2023-04-13 DIAGNOSIS — I7301 Raynaud's syndrome with gangrene: Secondary | ICD-10-CM | POA: Diagnosis not present

## 2023-05-06 DIAGNOSIS — I10 Essential (primary) hypertension: Secondary | ICD-10-CM | POA: Diagnosis not present

## 2023-05-06 DIAGNOSIS — Z89612 Acquired absence of left leg above knee: Secondary | ICD-10-CM | POA: Diagnosis not present

## 2023-06-06 DIAGNOSIS — Z89612 Acquired absence of left leg above knee: Secondary | ICD-10-CM | POA: Diagnosis not present

## 2023-06-06 DIAGNOSIS — I10 Essential (primary) hypertension: Secondary | ICD-10-CM | POA: Diagnosis not present

## 2023-06-28 DIAGNOSIS — M79672 Pain in left foot: Secondary | ICD-10-CM | POA: Diagnosis not present

## 2023-06-28 DIAGNOSIS — M79675 Pain in left toe(s): Secondary | ICD-10-CM | POA: Diagnosis not present

## 2023-06-28 DIAGNOSIS — M79671 Pain in right foot: Secondary | ICD-10-CM | POA: Diagnosis not present

## 2023-06-28 DIAGNOSIS — I739 Peripheral vascular disease, unspecified: Secondary | ICD-10-CM | POA: Diagnosis not present

## 2023-06-28 DIAGNOSIS — L110 Acquired keratosis follicularis: Secondary | ICD-10-CM | POA: Diagnosis not present

## 2023-06-28 DIAGNOSIS — M79674 Pain in right toe(s): Secondary | ICD-10-CM | POA: Diagnosis not present

## 2023-06-29 DIAGNOSIS — Z1231 Encounter for screening mammogram for malignant neoplasm of breast: Secondary | ICD-10-CM | POA: Diagnosis not present

## 2023-07-05 DIAGNOSIS — Z1231 Encounter for screening mammogram for malignant neoplasm of breast: Secondary | ICD-10-CM | POA: Diagnosis not present

## 2023-07-06 DIAGNOSIS — Z89612 Acquired absence of left leg above knee: Secondary | ICD-10-CM | POA: Diagnosis not present

## 2023-07-06 DIAGNOSIS — I10 Essential (primary) hypertension: Secondary | ICD-10-CM | POA: Diagnosis not present

## 2023-07-19 ENCOUNTER — Telehealth: Payer: Self-pay

## 2023-07-19 NOTE — Telephone Encounter (Signed)
 Rescheduled pt's previously cancelled appts with studies and to see PA for follow up. Pt is aware of these appts. And is aware we are still waiting for her dentist's office to fax forms.

## 2023-08-06 DIAGNOSIS — Z89612 Acquired absence of left leg above knee: Secondary | ICD-10-CM | POA: Diagnosis not present

## 2023-08-06 DIAGNOSIS — I10 Essential (primary) hypertension: Secondary | ICD-10-CM | POA: Diagnosis not present

## 2023-08-24 ENCOUNTER — Ambulatory Visit (HOSPITAL_COMMUNITY)

## 2023-08-24 ENCOUNTER — Ambulatory Visit

## 2023-08-30 DIAGNOSIS — I7300 Raynaud's syndrome without gangrene: Secondary | ICD-10-CM | POA: Diagnosis not present

## 2023-08-30 DIAGNOSIS — I739 Peripheral vascular disease, unspecified: Secondary | ICD-10-CM | POA: Diagnosis not present

## 2023-08-30 DIAGNOSIS — R269 Unspecified abnormalities of gait and mobility: Secondary | ICD-10-CM | POA: Diagnosis not present

## 2023-08-30 DIAGNOSIS — Z89612 Acquired absence of left leg above knee: Secondary | ICD-10-CM | POA: Diagnosis not present

## 2023-08-30 DIAGNOSIS — I10 Essential (primary) hypertension: Secondary | ICD-10-CM | POA: Diagnosis not present

## 2023-09-07 DIAGNOSIS — Z89612 Acquired absence of left leg above knee: Secondary | ICD-10-CM | POA: Diagnosis not present

## 2023-09-07 DIAGNOSIS — R269 Unspecified abnormalities of gait and mobility: Secondary | ICD-10-CM | POA: Diagnosis not present

## 2023-09-27 DIAGNOSIS — M79675 Pain in left toe(s): Secondary | ICD-10-CM | POA: Diagnosis not present

## 2023-09-27 DIAGNOSIS — M79672 Pain in left foot: Secondary | ICD-10-CM | POA: Diagnosis not present

## 2023-09-27 DIAGNOSIS — M79674 Pain in right toe(s): Secondary | ICD-10-CM | POA: Diagnosis not present

## 2023-09-27 DIAGNOSIS — M79671 Pain in right foot: Secondary | ICD-10-CM | POA: Diagnosis not present

## 2023-09-27 DIAGNOSIS — L110 Acquired keratosis follicularis: Secondary | ICD-10-CM | POA: Diagnosis not present

## 2023-09-27 DIAGNOSIS — I739 Peripheral vascular disease, unspecified: Secondary | ICD-10-CM | POA: Diagnosis not present

## 2023-09-30 ENCOUNTER — Other Ambulatory Visit: Payer: Self-pay | Admitting: Vascular Surgery

## 2023-09-30 DIAGNOSIS — I70235 Atherosclerosis of native arteries of right leg with ulceration of other part of foot: Secondary | ICD-10-CM

## 2023-09-30 DIAGNOSIS — Z89612 Acquired absence of left leg above knee: Secondary | ICD-10-CM | POA: Diagnosis not present

## 2023-09-30 DIAGNOSIS — I70245 Atherosclerosis of native arteries of left leg with ulceration of other part of foot: Secondary | ICD-10-CM

## 2023-09-30 DIAGNOSIS — I10 Essential (primary) hypertension: Secondary | ICD-10-CM | POA: Diagnosis not present

## 2023-10-08 DIAGNOSIS — R269 Unspecified abnormalities of gait and mobility: Secondary | ICD-10-CM | POA: Diagnosis not present

## 2023-10-08 DIAGNOSIS — Z89612 Acquired absence of left leg above knee: Secondary | ICD-10-CM | POA: Diagnosis not present

## 2023-10-15 DIAGNOSIS — R339 Retention of urine, unspecified: Secondary | ICD-10-CM | POA: Diagnosis not present

## 2023-10-15 DIAGNOSIS — Z79899 Other long term (current) drug therapy: Secondary | ICD-10-CM | POA: Diagnosis not present

## 2023-10-15 DIAGNOSIS — E878 Other disorders of electrolyte and fluid balance, not elsewhere classified: Secondary | ICD-10-CM | POA: Diagnosis not present

## 2023-10-15 DIAGNOSIS — F17210 Nicotine dependence, cigarettes, uncomplicated: Secondary | ICD-10-CM | POA: Diagnosis not present

## 2023-10-15 DIAGNOSIS — R109 Unspecified abdominal pain: Secondary | ICD-10-CM | POA: Diagnosis not present

## 2023-10-15 DIAGNOSIS — N3289 Other specified disorders of bladder: Secondary | ICD-10-CM | POA: Diagnosis not present

## 2023-10-15 DIAGNOSIS — R1084 Generalized abdominal pain: Secondary | ICD-10-CM | POA: Diagnosis not present

## 2023-10-15 DIAGNOSIS — N1339 Other hydronephrosis: Secondary | ICD-10-CM | POA: Diagnosis not present

## 2023-10-15 DIAGNOSIS — M25552 Pain in left hip: Secondary | ICD-10-CM | POA: Diagnosis not present

## 2023-10-15 DIAGNOSIS — I10 Essential (primary) hypertension: Secondary | ICD-10-CM | POA: Diagnosis not present

## 2023-10-15 DIAGNOSIS — Z743 Need for continuous supervision: Secondary | ICD-10-CM | POA: Diagnosis not present

## 2023-10-15 DIAGNOSIS — R338 Other retention of urine: Secondary | ICD-10-CM | POA: Diagnosis not present

## 2023-10-15 DIAGNOSIS — R300 Dysuria: Secondary | ICD-10-CM | POA: Diagnosis not present

## 2023-10-15 DIAGNOSIS — N3001 Acute cystitis with hematuria: Secondary | ICD-10-CM | POA: Diagnosis not present

## 2023-10-15 NOTE — ED Provider Notes (Signed)
 Emergency Department Provider Note    ED Clinical Impression   Final diagnoses:  Abdominal pain, unspecified abdominal location (Primary)  Urinary retention  Acute cystitis with hematuria  Left hip pain  Electrolyte abnormality    ED Assessment/Plan    Condition: Stable Disposition: Discharge  This chart has been completed using Dragon Medical Dictation software, and while attempts have been made to ensure accuracy, certain words and phrases may not be transcribed as intended.   History   Chief Complaint  Patient presents with  . Abdominal Pain  . Dysuria   HPI  Crystal Patterson is a 58 y.o. female who presents today to the emergency department complaining of abdominal pain and difficulty urinating since approximately 2:00 this morning.  The pain is constant at a 9 and no aggravating or alleviating factors have been identified.  She denies any fever, nausea, vomiting or diarrhea.  She also complains of left hip pain.  Allergies: has no known allergies. Medications: has a current medication list which includes the following long-term medication(s): allopurinol , amlodipine , aspirin , atenolol , atorvastatin , benazepril , escitalopram  oxalate, spironolactone , and trazodone. PMHx:  has a past medical history of Heart disease, Hypertension, Lupus, and Renal failure. PSHx:  has a past surgical history that includes Knee surgery; Hand surgery (Bilateral); c-section x2; Below knee leg amputation (Left); PORT A CATH PLACEMENT [facility] HISTORICAL RESULT) (2000); Colonoscopy; and Chemotherapy. SocHx:  reports that she has been smoking cigarettes. She has never used smokeless tobacco. She reports current alcohol use of about 2.0 standard drinks of alcohol per week. She reports current drug use. Drug: Marijuana. Allergies, Medications, Medical, Surgical, and Social History were reviewed as documented  above.   Social Drivers of Health with Concerns   Tobacco Use: High Risk (10/15/2023)   Patient History   . Smoking Tobacco Use: Every Day   . Smokeless Tobacco Use: Never   . Passive Exposure: Not on file  Alcohol Use: Alcohol Misuse (04/22/2022)   Received from [facility]   AUDIT-C   . Q1: How often do you have a drink containing alcohol?: 2-3 times a week   . Q2: How many drinks containing alcohol do you have on a typical day when you are drinking?: 1 or 2   . Q3: How often do you have six or more drinks on one occasion?: Weekly  Physical Activity: Not on file  Substance Use: Not on file (01/09/2023)  Intimate Partner Violence: Unknown (04/22/2022)   Received from [facility]   HITS   . Physically Hurt: Not on file   . Insult or Talk Down To: Not on file   . Threaten Physical Harm: Not on file   . Scream or Curse: Not on file  Social Connections: Unknown (04/22/2022)   Received from [facility]   Social Network   . Social Network: Not on file  Internet Connectivity: Not on file     Review Of Systems  Review of Systems  Constitutional:  Negative for chills and fever.  Respiratory:  Negative for shortness of breath.   Cardiovascular:  Negative for chest pain.  Gastrointestinal:  Positive for abdominal pain. Negative for nausea and vomiting.  Genitourinary:  Positive for dysuria.  Musculoskeletal:        Left hip pain  All other systems reviewed and are negative.  Physical Exam   BP 99/71   Pulse 76   Temp (!) 38.3 C (101 F) (Temporal)   Resp 20   Ht 162.6 cm (5' 4)   Wt 54.4 kg (120 lb)   SpO2 100%   BMI 20.60 kg/m   Physical Exam Vitals and nursing note reviewed.  Constitutional:      General: She is not in acute distress.    Appearance: She is well-developed.     Comments: Left AKA  HENT:     Head: Normocephalic and atraumatic.  Cardiovascular:     Rate and Rhythm: Normal rate and regular rhythm.     Heart sounds: Normal heart sounds.   Pulmonary:     Effort: Pulmonary effort is normal.     Breath sounds: Normal breath sounds.  Abdominal:     General: Abdomen is flat. Bowel sounds are normal.     Palpations: Abdomen is soft.     Tenderness: There is generalized abdominal tenderness.  Musculoskeletal:     Left hip: Tenderness present.  Neurological:     Mental Status: She is alert and oriented to person, place, and time.  Psychiatric:        Mood and Affect: Mood normal.        Behavior: Behavior normal.     ED Course  Medical Decision Making Reviewed and discussed lab results.  Will treat with Macrobid and advised NSAIDs and/or Tylenol , increased hydration and to keep the catheter in place until seen by urology.  Follow-up with urologist (ambulatory referral placed).  Follow-up with PCP.  I have reviewed my clinical findings and my clinical impression with the patient. The patient has expressed understanding that at this time there is no evidence for a more serious underlying process, but also understands that early in the process of a condition such as this, an initial workup can be falsely reassuring. I have counseled and discussed follow-up with the patient, stressing the importance of appropriate follow-up. I have also counseled the patient to return if symptoms worsen or if there are any concerns. Routine discharge counseling was given to the patient and the patient understands that worsening, changing or persistent symptoms should prompt an immediate call or follow up with their primary physician or return to the emergency department for reevaluation. Patient has expressed understanding.  Amount and/or Complexity of Data Reviewed Labs: ordered. Decision-making details documented in ED Course. Radiology: ordered. Decision-making details documented in ED Course.  Risk OTC drugs. Prescription drug management. Parenteral controlled substances. Decision regarding hospitalization. Diagnosis or treatment  significantly limited by social determinants of health.     Procedures   No results found for this visit on 10/15/23 (from the past 4464 hours).   ED Results Results for orders placed or performed during the hospital encounter of 10/15/23  Comprehensive Metabolic Panel  Result Value Ref Range   Sodium 131 (L) 135 - 145 mmol/L   Potassium 2.6 (LL) 3.5 - 5.0 mmol/L   Chloride 93 (L) 98 - 107 mmol/L   CO2 16.5 (L) 21.0 - 32.0 mmol/L   Anion Gap >15 (H) 3 - 11 mmol/L   BUN 5 (L) 8 - 20 mg/dL   Creatinine 9.29 9.39 - 1.10 mg/dL   BUN/Creatinine Ratio 7    eGFR CKD-EPI (2021) Female >90 >=60 mL/min/1.58m2   Glucose 116 70 - 179 mg/dL   Calcium  8.6 8.5 - 10.1 mg/dL   Albumin  3.9 3.5 - 5.0 g/dL   Total Protein 8.7 (H) 6.0 -  8.0 g/dL   Total Bilirubin 0.9 0.3 - 1.2 mg/dL   AST 31 15 - 40 U/L   ALT 18 12 - 78 U/L   Alkaline Phosphatase 138 (H) 46 - 116 U/L  Lipase  Result Value Ref Range   Lipase 36 16 - 77 U/L  Magnesium   Result Value Ref Range   Magnesium  1.0 (L) 1.6 - 2.6 mg/dL  Magnesium   Result Value Ref Range   Magnesium  1.7 1.6 - 2.6 mg/dL  Basic Metabolic Panel  Result Value Ref Range   Sodium 133 (L) 135 - 145 mmol/L   Potassium 3.5 3.5 - 5.0 mmol/L   Chloride 97 (L) 98 - 107 mmol/L   CO2 24.6 21.0 - 32.0 mmol/L   Anion Gap 11 3 - 11 mmol/L   BUN 5 (L) 8 - 20 mg/dL   Creatinine 9.32 9.39 - 1.10 mg/dL   BUN/Creatinine Ratio 7    eGFR CKD-EPI (2021) Female >90 >=60 mL/min/1.30m2   Glucose 93 70 - 179 mg/dL   Calcium  8.5 8.5 - 10.1 mg/dL  CBC w/ Differential  Result Value Ref Range   WBC 13.4 (H) 4.0 - 10.5 10*9/L   RBC 4.52 3.80 - 5.10 10*12/L   HGB 12.4 11.5 - 15.0 g/dL   HCT 61.8 65.9 - 55.9 %   MCV 84.3 80.0 - 98.0 fL   MCH 27.4 27.0 - 34.0 pg   MCHC 32.5 32.0 - 36.0 g/dL   RDW 83.5 (H) 88.4 - 85.4 %   MPV 10.0 7.4 - 10.4 fL   Platelet 186 140 - 415 10*9/L   Neutrophils % 84.0 %   Lymphocytes % 6.9 %   Monocytes % 8.5 %   Eosinophils % 0.0 %    Basophils % 0.1 %   Absolute Neutrophils 11.3 (H) 1.8 - 7.8 10*9/L   Absolute Lymphocytes 0.9 0.7 - 4.5 10*9/L   Absolute Monocytes 1.1 (H) 0.1 - 1.0 10*9/L   Absolute Eosinophils 0.0 0.0 - 0.4 10*9/L   Absolute Basophils 0.0 0.0 - 0.2 10*9/L  Urinalysis with Microscopy with Culture Reflex  Result Value Ref Range   Color, UA Amber    Clarity, UA Cloudy (A) Clear   Specific Gravity, UA 1.008 (L) 1.010 - 1.025   pH, UA 6.0 5.0 - 8.0   Leukocyte Esterase, UA Trace (A) Negative   Nitrite, UA Positive (A) Negative   Protein, UA 100 mg/dL (A) Negative   Glucose, UA Negative Negative, Trace   Ketones, UA Negative Negative   Urobilinogen, UA <2.0 mg/dL <7.9 mg/dL   Bilirubin, UA Negative Negative   Blood, UA Large (A) Negative   RBC, UA 4,661 (H) 0 - 3 /HPF   WBC, UA 129 (H) 0 - 3 /HPF   Squam Epithel, UA 0 0 - 10 /HPF   Bacteria, UA Occasional (A) None Seen /HPF   WBC Clumps None Seen None Seen /HPF   Hyphal Yeast None Seen None Seen /HPF   Yeast, UA None Seen None Seen /HPF   XR Hip 2 Views Left Result Date: 10/15/2023 Exam: Left Hip  History: Severe left hip/pelvic pain  Technique: 2 views  Comparison: No prior exams  Findings:  Bones: Bones of the hip are intact, negative for fracture.  Articulation: Articulations in the hip are normal.  The femoral head is normally located in the acetabulum.  Pelvis: Bones of the pelvis are intact.     1. Normal hip.  Signed (Electronic Signature): 10/15/2023 5:57 PM Signed  By: Debby LITTIE Willy MICKEY, MD  CT Abdomen Pelvis Wo Contrast Result Date: 10/15/2023 Exam: CT of the Abdomen and Pelvis without Contrast  History: Pain.  Technique: Routine CT of the abdomen and pelvis without IV contrast. AEC (automated exposure control) and/or manual techniques such as size-specific kV and mAs are employed where appropriate to reduce radiation exposure for all CT exams.  Comparison:  04/21/2022.  Findings: LOWER THORAX:  Clear lungs. No pleural effusion.  LIVER:   Normal size. Smooth contour. No focal lesion.  BILIARY: No radiopaque stones or sludge. No evidence of cholecystitis. No significant intra- or extrahepatic biliary ductal dilation.  PANCREAS: Normal background parenchyma. No focal lesion. Main pancreatic duct normal in caliber.  SPLEEN: Normal size.  ADRENALS:  Normal morphology.  KIDNEYS/URETERS:  There is mild to moderate bilateral hydronephrosis likely secondary to significant distention of the urinary bladder. No obstructing calculi identified.  BOWEL:  Stomach normal. No evidence of enterocolitis. No obstruction.  Normal appendix.  VASCULAR:  Nonaneurysmal abdominal aorta. 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  LYMPH NODES:  No lymphadenopathy.  PERITONEUM: Trace pelvic free fluid. No drainable collection.  PELVIC ORGANS:  No pelvic mass. Significantly distended urinary bladder.  BONES:  No aggressive osseous lesions. Avascular necrosis of the right femoral head without subchondral collapse. Chronic bilateral pars defects noted at L5 with grade one antral listhesis of L5 on S1.  SOFT TISSUES: Normal.    Significantly distended urinary bladder with mild to moderate bilateral hydronephrosis. No obstructing calculi identified.  Signed (Electronic Signature): 10/15/2023 1:10 PM Signed By: Gideon Bollino, MD   Medications Administered:  Medications  sodium chloride  0.9% (NS) bolus 250 mL (0 mL Intravenous Stopped 10/15/23 1412)  ketorolac  (TORADOL ) injection 30 mg (30 mg Intramuscular Given 10/15/23 1243)  magnesium  sulfate in D5W 1 gram/100 mL infusion 1 g (0 g Intravenous Stopped 10/15/23 1413)  cefTRIAXone  (ROCEPHIN ) IVPB 1 g in 50 mL dextrose  (premix) (0 g Intravenous Stopped 10/15/23 1525)  ondansetron  (ZOFRAN -ODT) disintegrating tablet 4 mg (4 mg Oral Given 10/15/23 1415)  magnesium  sulfate in D5W 1 gram/100 mL infusion 1 g (0 g Intravenous Stopped 10/15/23 1617)   potassium chloride  20 mEq in 100 mL IVPB Premix (0 mEq Intravenous Stopped 10/15/23 1834)  potassium chloride  ER tablet 40 mEq (40 mEq Oral Given 10/15/23 1518)  ketorolac  (TORADOL ) injection 15 mg (15 mg Intravenous Given 10/15/23 1727)  acetaminophen  (TYLENOL ) tablet 975 mg (975 mg Oral Given 10/15/23 1930)    Discharge Medications (Medications Prescribed during this  ED visit and Patient's Home Medications) :    Your Medication List     START taking these medications    nitrofurantoin (macrocrystal-monohydrate) 100 MG capsule Commonly known as: MACROBID Take 1 capsule (100 mg total) by mouth two (2) times a day for 5 days.       ASK your doctor about these medications    allopurinol  100 MG tablet Commonly known as: ZYLOPRIM  Take 1 tablet (100 mg total) by mouth daily.   amlodipine  10 MG tablet Commonly known as: NORVASC  Take 1 tablet (10 mg total) by mouth daily.   aspirin  81 MG tablet Commonly known as: ECOTRIN Take 1 tablet (81 mg total) by mouth daily.   atenolol  25 MG tablet Commonly known as: TENORMIN  Take 1 tablet (25 mg total) by mouth daily.   atorvastatin  40 MG tablet Commonly known as: LIPITOR  Take 1 tablet (40 mg total) by  mouth.   benazepril  40 MG tablet Commonly known as: LOTENSIN  Take 1 tablet (40 mg total) by mouth daily.   diphenhydrAMINE -acetaminophen  25-500 mg Tab Commonly known as: TYLENOL  PM Take 1 tablet by mouth.   docusate sodium  100 MG capsule Commonly known as: COLACE Take 1 capsule (100 mg total) by mouth two (2) times a day.   escitalopram  oxalate 10 MG tablet Commonly known as: LEXAPRO  Take 1 tablet (10 mg total) by mouth daily.   furosemide  40 MG tablet Commonly known as: LASIX  Take 1 tablet (40 mg total) by mouth daily.   gabapentin  300 MG capsule Commonly known as: NEURONTIN  Take 1 capsule (300 mg total) by mouth two (2) times a day.   ibuprofen  800 MG tablet Commonly known as: MOTRIN  Take 1 tablet (800 mg total) by  mouth two (2) times a day as needed.   inhalational spacing device Spcr 1 each by Miscellaneous route daily as needed.   omeprazole  20 MG capsule Commonly known as: PriLOSEC Take 1 capsule (20 mg total) by mouth daily.   spironolactone  25 MG tablet Commonly known as: ALDACTONE  Take 1 tablet (25 mg total) by mouth.   traZODone 100 MG tablet Commonly known as: DESYREL Take 1 tablet (100 mg total) by mouth nightly.          Rockey Mallick [place], [place] 10/15/23 2158

## 2023-10-17 DIAGNOSIS — Z89029 Acquired absence of unspecified finger(s): Secondary | ICD-10-CM | POA: Diagnosis not present

## 2023-10-17 DIAGNOSIS — R338 Other retention of urine: Secondary | ICD-10-CM | POA: Diagnosis not present

## 2023-10-17 DIAGNOSIS — E876 Hypokalemia: Secondary | ICD-10-CM | POA: Diagnosis not present

## 2023-10-17 DIAGNOSIS — I739 Peripheral vascular disease, unspecified: Secondary | ICD-10-CM | POA: Diagnosis not present

## 2023-10-18 DIAGNOSIS — E876 Hypokalemia: Secondary | ICD-10-CM | POA: Diagnosis not present

## 2023-10-25 NOTE — Progress Notes (Deleted)
 Office Note   History of Present Illness   Crystal Patterson is a 58 y.o. (08-12-1965) female who presents for surveillance of PAD. They have a history of ***  The patient returns today for follow up. He/she denies any recent medical history changes. The patient also denies any claudication, rest pain, or tissue loss of the lower extremities.  Current Outpatient Medications  Medication Sig Dispense Refill   allopurinol  (ZYLOPRIM ) 100 MG tablet Take 1 tablet (100 mg total) by mouth daily. 30 tablet 0   amLODipine  (NORVASC ) 10 MG tablet Take 10 mg by mouth daily.     aspirin  EC 81 MG tablet Take 81 mg by mouth daily. Swallow whole.     atenolol  (TENORMIN ) 25 MG tablet Take 1 tablet (25 mg total) by mouth daily. 30 tablet 0   atorvastatin  (LIPITOR ) 40 MG tablet Take 1 tablet (40 mg total) by mouth daily at 6 PM. 30 tablet 0   benazepril  (LOTENSIN ) 40 MG tablet Take 40 mg by mouth daily.     cephALEXin  (KEFLEX ) 500 MG capsule Take 1 capsule (500 mg total) by mouth 3 (three) times daily. 21 capsule 0   Ensure (ENSURE) Take 237 mLs by mouth daily as needed (supplementation).     furosemide  (LASIX ) 40 MG tablet Take 1 tablet (40 mg total) by mouth daily. 30 tablet 0   gabapentin  (NEURONTIN ) 300 MG capsule Take 1 capsule (300 mg total) by mouth daily. (Patient taking differently: Take 300 mg by mouth 2 (two) times daily.) 30 capsule 0   ibuprofen  (ADVIL ) 800 MG tablet Take 800 mg by mouth 2 (two) times daily as needed for moderate pain.     omeprazole  (PRILOSEC) 20 MG capsule Take 1 capsule (20 mg total) by mouth daily. 30 capsule 0   potassium chloride  (KLOR-CON ) 10 MEQ tablet Take 1 tablet (10 mEq total) by mouth daily. 30 tablet 0   traZODone (DESYREL) 100 MG tablet Take 150 mg by mouth at bedtime as needed for sleep.     No current facility-administered medications for this visit.    ***REVIEW OF SYSTEMS (negative unless checked):   Cardiac:  []  Chest pain or chest pressure? []   Shortness of breath upon activity? []  Shortness of breath when lying flat? []  Irregular heart rhythm?  Vascular:  []  Pain in calf, thigh, or hip brought on by walking? []  Pain in feet at night that wakes you up from your sleep? []  Blood clot in your veins? []  Leg swelling?  Pulmonary:  []  Oxygen  at home? []  Productive cough? []  Wheezing?  Neurologic:  []  Sudden weakness in arms or legs? []  Sudden numbness in arms or legs? []  Sudden onset of difficult speaking or slurred speech? []  Temporary loss of vision in one eye? []  Problems with dizziness?  Gastrointestinal:  []  Blood in stool? []  Vomited blood?  Genitourinary:  []  Burning when urinating? []  Blood in urine?  Psychiatric:  []  Major depression  Hematologic:  []  Bleeding problems? []  Problems with blood clotting?  Dermatologic:  []  Rashes or ulcers?  Constitutional:  []  Fever or chills?  Ear/Nose/Throat:  []  Change in hearing? []  Nose bleeds? []  Sore throat?  Musculoskeletal:  []  Back pain? []  Joint pain? []  Muscle pain?   Physical Examination  ***There were no vitals filed for this visit. ***There is no height or weight on file to calculate BMI.  General:  WDWN in NAD; vital signs documented above Gait: Not observed HENT: WNL, normocephalic Pulmonary: normal non-labored  breathing , without rales, rhonchi,  wheezing Cardiac: {Desc; regular/irreg:14544} HR, without murmurs {With/Without:20273} carotid bruit*** Abdomen: soft, NT, no masses Skin: {With/Without:20273} rashes Vascular Exam/Pulses: Palpable/nonpalpable femoral pulses, palpable/nonpalpable popliteal pulses, palpable/nonpalpable pedal pulses. Left DP/PT/Peroneal doppler signals. Right DP/PT/Peroneal doppler signals Extremities: {With/Without:20273} ischemic changes, {With/Without:20273} gangrene , {With/Without:20273} cellulitis; {With/Without:20273} open wounds;  Musculoskeletal: no muscle wasting or atrophy  Neurologic: A&O X 3;  No  focal weakness or paresthesias are detected Psychiatric:  The pt has {Desc; normal/abnormal:11317::Normal} affect.  Non-Invasive Vascular imaging   ABI (10/27/2023) R:  ABI: *** (***),  PT: {Signals:19197::none,mono,bi,tri} DP: {Signals:19197::none,mono,bi,tri} TBI:  *** L:  ABI: *** (***),  PT: {Signals:19197::none,mono,bi,tri} DP: {Signals:19197::none,mono,bi,tri} TBI: ***   *** Arterial Duplex (10/27/2023)   Medical Decision Making   Crystal Patterson is a 58 y.o. female who presents for surveillance of PAD  Based on the patient's vascular studies, their ABIs are essentially unchanged since last visit. *** Arterial duplex *** The patient denies any claudication, rest pain, or tissue loss. They have palpable/nonpalpable pedal pulses with *** doppler signals They will continue their *** and follow up with our office in *** months/year with ABIs and ***   Ahmed Holster PA-C [facility] Office: [phone]  Clinic MD: [place]

## 2023-10-26 DIAGNOSIS — I10 Essential (primary) hypertension: Secondary | ICD-10-CM | POA: Diagnosis not present

## 2023-10-26 DIAGNOSIS — N1330 Unspecified hydronephrosis: Secondary | ICD-10-CM | POA: Diagnosis not present

## 2023-10-26 DIAGNOSIS — Z79899 Other long term (current) drug therapy: Secondary | ICD-10-CM | POA: Diagnosis not present

## 2023-10-26 DIAGNOSIS — N3289 Other specified disorders of bladder: Secondary | ICD-10-CM | POA: Diagnosis not present

## 2023-10-26 DIAGNOSIS — R339 Retention of urine, unspecified: Secondary | ICD-10-CM | POA: Diagnosis not present

## 2023-10-26 DIAGNOSIS — Z7982 Long term (current) use of aspirin: Secondary | ICD-10-CM | POA: Diagnosis not present

## 2023-10-26 DIAGNOSIS — Z89519 Acquired absence of unspecified leg below knee: Secondary | ICD-10-CM | POA: Diagnosis not present

## 2023-10-27 ENCOUNTER — Ambulatory Visit (HOSPITAL_COMMUNITY): Admission: RE | Admit: 2023-10-27 | Source: Ambulatory Visit

## 2023-10-27 ENCOUNTER — Ambulatory Visit

## 2023-10-27 ENCOUNTER — Ambulatory Visit (HOSPITAL_COMMUNITY)

## 2023-11-02 ENCOUNTER — Ambulatory Visit: Admitting: Urology

## 2023-11-04 DIAGNOSIS — R339 Retention of urine, unspecified: Secondary | ICD-10-CM | POA: Diagnosis not present

## 2023-11-04 DIAGNOSIS — N1330 Unspecified hydronephrosis: Secondary | ICD-10-CM | POA: Diagnosis not present

## 2023-11-08 DIAGNOSIS — R269 Unspecified abnormalities of gait and mobility: Secondary | ICD-10-CM | POA: Diagnosis not present

## 2023-11-17 DIAGNOSIS — I10 Essential (primary) hypertension: Secondary | ICD-10-CM | POA: Diagnosis not present

## 2023-11-17 DIAGNOSIS — Z89612 Acquired absence of left leg above knee: Secondary | ICD-10-CM | POA: Diagnosis not present

## 2023-12-05 DIAGNOSIS — Z978 Presence of other specified devices: Secondary | ICD-10-CM | POA: Diagnosis not present

## 2023-12-05 DIAGNOSIS — Z466 Encounter for fitting and adjustment of urinary device: Secondary | ICD-10-CM | POA: Diagnosis not present

## 2023-12-08 ENCOUNTER — Other Ambulatory Visit: Payer: Self-pay

## 2023-12-08 ENCOUNTER — Encounter (HOSPITAL_COMMUNITY): Payer: Self-pay

## 2023-12-08 ENCOUNTER — Inpatient Hospital Stay (HOSPITAL_COMMUNITY)
Admission: EM | Admit: 2023-12-08 | Discharge: 2023-12-11 | DRG: 271 | Disposition: A | Source: Other Acute Inpatient Hospital | Attending: Internal Medicine | Admitting: Internal Medicine

## 2023-12-08 DIAGNOSIS — Z79899 Other long term (current) drug therapy: Secondary | ICD-10-CM | POA: Diagnosis not present

## 2023-12-08 DIAGNOSIS — R338 Other retention of urine: Secondary | ICD-10-CM | POA: Diagnosis present

## 2023-12-08 DIAGNOSIS — Z96652 Presence of left artificial knee joint: Secondary | ICD-10-CM | POA: Diagnosis present

## 2023-12-08 DIAGNOSIS — Z95820 Peripheral vascular angioplasty status with implants and grafts: Secondary | ICD-10-CM | POA: Diagnosis not present

## 2023-12-08 DIAGNOSIS — R001 Bradycardia, unspecified: Secondary | ICD-10-CM | POA: Diagnosis not present

## 2023-12-08 DIAGNOSIS — I70221 Atherosclerosis of native arteries of extremities with rest pain, right leg: Principal | ICD-10-CM | POA: Diagnosis present

## 2023-12-08 DIAGNOSIS — T82868A Thrombosis of vascular prosthetic devices, implants and grafts, initial encounter: Secondary | ICD-10-CM | POA: Diagnosis not present

## 2023-12-08 DIAGNOSIS — R0600 Dyspnea, unspecified: Secondary | ICD-10-CM | POA: Diagnosis not present

## 2023-12-08 DIAGNOSIS — Z7902 Long term (current) use of antithrombotics/antiplatelets: Secondary | ICD-10-CM | POA: Diagnosis not present

## 2023-12-08 DIAGNOSIS — N189 Chronic kidney disease, unspecified: Secondary | ICD-10-CM | POA: Diagnosis not present

## 2023-12-08 DIAGNOSIS — Y831 Surgical operation with implant of artificial internal device as the cause of abnormal reaction of the patient, or of later complication, without mention of misadventure at the time of the procedure: Secondary | ICD-10-CM | POA: Diagnosis present

## 2023-12-08 DIAGNOSIS — J45909 Unspecified asthma, uncomplicated: Secondary | ICD-10-CM | POA: Diagnosis present

## 2023-12-08 DIAGNOSIS — F17200 Nicotine dependence, unspecified, uncomplicated: Secondary | ICD-10-CM | POA: Diagnosis not present

## 2023-12-08 DIAGNOSIS — M898X6 Other specified disorders of bone, lower leg: Secondary | ICD-10-CM | POA: Diagnosis not present

## 2023-12-08 DIAGNOSIS — Z7982 Long term (current) use of aspirin: Secondary | ICD-10-CM | POA: Diagnosis not present

## 2023-12-08 DIAGNOSIS — I739 Peripheral vascular disease, unspecified: Secondary | ICD-10-CM | POA: Diagnosis present

## 2023-12-08 DIAGNOSIS — I7092 Chronic total occlusion of artery of the extremities: Secondary | ICD-10-CM | POA: Diagnosis not present

## 2023-12-08 DIAGNOSIS — I700 Atherosclerosis of aorta: Secondary | ICD-10-CM | POA: Diagnosis not present

## 2023-12-08 DIAGNOSIS — I723 Aneurysm of iliac artery: Secondary | ICD-10-CM | POA: Diagnosis not present

## 2023-12-08 DIAGNOSIS — K551 Chronic vascular disorders of intestine: Secondary | ICD-10-CM | POA: Diagnosis not present

## 2023-12-08 DIAGNOSIS — I2510 Atherosclerotic heart disease of native coronary artery without angina pectoris: Secondary | ICD-10-CM | POA: Diagnosis not present

## 2023-12-08 DIAGNOSIS — E7800 Pure hypercholesterolemia, unspecified: Secondary | ICD-10-CM | POA: Diagnosis not present

## 2023-12-08 DIAGNOSIS — Z89411 Acquired absence of right great toe: Secondary | ICD-10-CM

## 2023-12-08 DIAGNOSIS — Z8739 Personal history of other diseases of the musculoskeletal system and connective tissue: Secondary | ICD-10-CM | POA: Diagnosis not present

## 2023-12-08 DIAGNOSIS — R0902 Hypoxemia: Secondary | ICD-10-CM | POA: Diagnosis not present

## 2023-12-08 DIAGNOSIS — Z87891 Personal history of nicotine dependence: Secondary | ICD-10-CM

## 2023-12-08 DIAGNOSIS — I5032 Chronic diastolic (congestive) heart failure: Secondary | ICD-10-CM | POA: Diagnosis present

## 2023-12-08 DIAGNOSIS — Z993 Dependence on wheelchair: Secondary | ICD-10-CM

## 2023-12-08 DIAGNOSIS — T82856A Stenosis of peripheral vascular stent, initial encounter: Secondary | ICD-10-CM | POA: Diagnosis not present

## 2023-12-08 DIAGNOSIS — M79671 Pain in right foot: Secondary | ICD-10-CM | POA: Diagnosis not present

## 2023-12-08 DIAGNOSIS — E162 Hypoglycemia, unspecified: Secondary | ICD-10-CM | POA: Diagnosis present

## 2023-12-08 DIAGNOSIS — I3481 Nonrheumatic mitral (valve) annulus calcification: Secondary | ICD-10-CM | POA: Diagnosis not present

## 2023-12-08 DIAGNOSIS — Z560 Unemployment, unspecified: Secondary | ICD-10-CM | POA: Diagnosis not present

## 2023-12-08 DIAGNOSIS — Z800 Family history of malignant neoplasm of digestive organs: Secondary | ICD-10-CM

## 2023-12-08 DIAGNOSIS — I252 Old myocardial infarction: Secondary | ICD-10-CM

## 2023-12-08 DIAGNOSIS — F17210 Nicotine dependence, cigarettes, uncomplicated: Secondary | ICD-10-CM | POA: Diagnosis not present

## 2023-12-08 DIAGNOSIS — I10 Essential (primary) hypertension: Secondary | ICD-10-CM | POA: Diagnosis present

## 2023-12-08 DIAGNOSIS — I998 Other disorder of circulatory system: Secondary | ICD-10-CM | POA: Diagnosis not present

## 2023-12-08 DIAGNOSIS — M329 Systemic lupus erythematosus, unspecified: Secondary | ICD-10-CM | POA: Diagnosis not present

## 2023-12-08 DIAGNOSIS — I70219 Atherosclerosis of native arteries of extremities with intermittent claudication, unspecified extremity: Secondary | ICD-10-CM | POA: Diagnosis not present

## 2023-12-08 DIAGNOSIS — F1290 Cannabis use, unspecified, uncomplicated: Secondary | ICD-10-CM | POA: Diagnosis not present

## 2023-12-08 DIAGNOSIS — E876 Hypokalemia: Secondary | ICD-10-CM | POA: Diagnosis not present

## 2023-12-08 DIAGNOSIS — Z89421 Acquired absence of other right toe(s): Secondary | ICD-10-CM

## 2023-12-08 DIAGNOSIS — Z452 Encounter for adjustment and management of vascular access device: Secondary | ICD-10-CM | POA: Diagnosis not present

## 2023-12-08 DIAGNOSIS — N182 Chronic kidney disease, stage 2 (mild): Secondary | ICD-10-CM | POA: Diagnosis not present

## 2023-12-08 DIAGNOSIS — I130 Hypertensive heart and chronic kidney disease with heart failure and stage 1 through stage 4 chronic kidney disease, or unspecified chronic kidney disease: Secondary | ICD-10-CM | POA: Diagnosis not present

## 2023-12-08 DIAGNOSIS — Z9889 Other specified postprocedural states: Secondary | ICD-10-CM | POA: Diagnosis not present

## 2023-12-08 DIAGNOSIS — Z7401 Bed confinement status: Secondary | ICD-10-CM | POA: Diagnosis not present

## 2023-12-08 DIAGNOSIS — M349 Systemic sclerosis, unspecified: Secondary | ICD-10-CM | POA: Diagnosis present

## 2023-12-08 DIAGNOSIS — M79604 Pain in right leg: Secondary | ICD-10-CM | POA: Diagnosis not present

## 2023-12-08 DIAGNOSIS — R269 Unspecified abnormalities of gait and mobility: Secondary | ICD-10-CM | POA: Diagnosis not present

## 2023-12-08 DIAGNOSIS — R9431 Abnormal electrocardiogram [ECG] [EKG]: Secondary | ICD-10-CM | POA: Diagnosis not present

## 2023-12-08 DIAGNOSIS — T82858A Stenosis of vascular prosthetic devices, implants and grafts, initial encounter: Secondary | ICD-10-CM | POA: Diagnosis not present

## 2023-12-08 DIAGNOSIS — Z89612 Acquired absence of left leg above knee: Secondary | ICD-10-CM

## 2023-12-08 DIAGNOSIS — I1310 Hypertensive heart and chronic kidney disease without heart failure, with stage 1 through stage 4 chronic kidney disease, or unspecified chronic kidney disease: Secondary | ICD-10-CM | POA: Diagnosis not present

## 2023-12-08 LAB — I-STAT CHEM 8, ED
BUN: <3 mg/dL — ABNORMAL LOW (ref 6–20)
Calcium, Ion: 1.11 mmol/L — ABNORMAL LOW (ref 1.15–1.40)
Chloride: 100 mmol/L (ref 98–111)
Creatinine, Ser: 0.6 mg/dL (ref 0.44–1.00)
Glucose, Bld: 75 mg/dL (ref 70–99)
HCT: 34 % — ABNORMAL LOW (ref 36.0–46.0)
Hemoglobin: 11.6 g/dL — ABNORMAL LOW (ref 12.0–15.0)
Potassium: 2.7 mmol/L — CL (ref 3.5–5.1)
Sodium: 139 mmol/L (ref 135–145)
TCO2: 27 mmol/L (ref 22–32)

## 2023-12-08 LAB — PROTIME-INR
INR: 1.1 (ref 0.8–1.2)
Prothrombin Time: 15.3 s — ABNORMAL HIGH (ref 11.4–15.2)

## 2023-12-08 LAB — BASIC METABOLIC PANEL WITH GFR
Anion gap: 11 (ref 5–15)
BUN: <5 mg/dL — ABNORMAL LOW (ref 6–20)
CO2: 25 mmol/L (ref 22–32)
Calcium: 8.1 mg/dL — ABNORMAL LOW (ref 8.9–10.3)
Chloride: 100 mmol/L (ref 98–111)
Creatinine, Ser: 0.6 mg/dL (ref 0.44–1.00)
GFR, Estimated: >60 mL/min (ref >60)
Glucose, Bld: 76 mg/dL (ref 70–99)
Potassium: 2.7 mmol/L — CL (ref 3.5–5.1)
Sodium: 136 mmol/L (ref 135–145)

## 2023-12-08 LAB — CBC WITH DIFFERENTIAL/PLATELET
Abs Immature Granulocytes: 0.02 K/uL (ref 0.00–0.07)
Basophils Absolute: 0.1 K/uL (ref 0.0–0.1)
Basophils Relative: 1 %
Eosinophils Absolute: 0.2 K/uL (ref 0.0–0.5)
Eosinophils Relative: 2 %
HCT: 45.4 % (ref 36.0–46.0)
Hemoglobin: 14.2 g/dL (ref 12.0–15.0)
Immature Granulocytes: 0 %
Lymphocytes Relative: 38 %
Lymphs Abs: 2.5 K/uL (ref 0.7–4.0)
MCH: 29.5 pg (ref 26.0–34.0)
MCHC: 31.3 g/dL (ref 30.0–36.0)
MCV: 94.4 fL (ref 80.0–100.0)
Monocytes Absolute: 0.7 K/uL (ref 0.1–1.0)
Monocytes Relative: 10 %
Neutro Abs: 3.3 K/uL (ref 1.7–7.7)
Neutrophils Relative %: 49 %
Platelets: 146 K/uL — ABNORMAL LOW (ref 150–400)
RBC: 4.81 MIL/uL (ref 3.87–5.11)
RDW: 13.7 % (ref 11.5–15.5)
WBC: 6.7 K/uL (ref 4.0–10.5)
nRBC: 0 % (ref 0.0–0.2)

## 2023-12-08 LAB — TSH: TSH: 5.443 u[IU]/mL — ABNORMAL HIGH (ref 0.350–4.500)

## 2023-12-08 LAB — LACTIC ACID, PLASMA: Lactic Acid, Venous: 2.1 mmol/L (ref 0.5–1.9)

## 2023-12-08 LAB — CBG MONITORING, ED
Glucose-Capillary: 71 mg/dL (ref 70–99)
Glucose-Capillary: 93 mg/dL (ref 70–99)

## 2023-12-08 MED ORDER — FENTANYL CITRATE (PF) 50 MCG/ML IJ SOSY
25.0000 ug | PREFILLED_SYRINGE | INTRAMUSCULAR | Status: DC | PRN
  Administered 2023-12-09 – 2023-12-10 (×5): 25 ug via INTRAVENOUS
  Filled 2023-12-08 (×5): qty 1

## 2023-12-08 MED ORDER — SODIUM CHLORIDE 0.9 % IV BOLUS
500.0000 mL | Freq: Once | INTRAVENOUS | Status: AC
  Administered 2023-12-09: 500 mL via INTRAVENOUS

## 2023-12-08 MED ORDER — HEPARIN SODIUM (PORCINE) 5000 UNIT/ML IJ SOLN: 60.0000 [IU]/kg | Freq: Once | INTRAMUSCULAR | Status: DC

## 2023-12-08 MED ORDER — POTASSIUM CHLORIDE 10 MEQ/100ML IV SOLN
10.0000 meq | INTRAVENOUS | Status: AC
  Administered 2023-12-08 – 2023-12-09 (×4): 10 meq via INTRAVENOUS
  Filled 2023-12-08 (×4): qty 100

## 2023-12-08 MED ORDER — HEPARIN (PORCINE) 25000 UT/250ML-% IV SOLN
900.0000 [IU]/h | INTRAVENOUS | Status: DC
  Administered 2023-12-08: 900 [IU]/h via INTRAVENOUS
  Filled 2023-12-08: qty 250

## 2023-12-08 MED ORDER — HEPARIN BOLUS VIA INFUSION
3200.0000 [IU] | Freq: Once | INTRAVENOUS | Status: AC
  Administered 2023-12-08: 3200 [IU] via INTRAVENOUS
  Filled 2023-12-08: qty 3200

## 2023-12-08 MED ORDER — POTASSIUM CHLORIDE ER 10 MEQ PO TBCR
40.0000 meq | EXTENDED_RELEASE_TABLET | Freq: Once | ORAL | Status: AC
  Administered 2023-12-08: 40 meq via ORAL
  Filled 2023-12-08: qty 4

## 2023-12-08 NOTE — Consult Note (Addendum)
 Hospital Consult    Reason for Consult:  right foot pain Requesting Physician:  ER MRN #:  [id number]  History of Present Illness: This is a 58 y.o. female who presented to the hospital with a several day hx of right foot pain.    She  has hx of aortogram drug eluting stenting of the SFA, balloon angioplasty of the PTA on 06/30/2022 for critical limb ischemia with tissue loss by Dr. Lanis.  She had right great toe amputation on 07/07/2022 by Dr. Gershon.  She has hx of left AKA 04/04/2020 by Dr. Harden.    CT abd/pelvis from August 2025 reveals  Nonaneurysmal abdominal aorta. 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   She also has hx of systemic scleroderma with hx of non healing ulcers on right hand with hx of hand surgery.   She was last seen in our office 09/17/2022 and at that time, she was not having any issues with her foot and her great toe amp was healing.  She had an ABI of 1.13 with biphasic waveforms.   She comes to the hospital with several day hx of right foot pain.  She does have feeling in her foot.  She does have a prosthesis but states she has been sick and has not been walking.  She is on asa and statin.  She has PMH significant for CHF, HTN, hx MI, gout.    Creatinine at OSH was 0.73 earlier today.    The pt is on a statin for cholesterol management.  The pt is on a daily aspirin .   Other AC:  none (heparin  gtt to be started) The pt is on CCB, BB, ACEI, diuretic for hypertension.   The pt is not on medication for diabetes PTA. Tobacco hx:  former  Past Medical History:  Diagnosis Date   Anginal pain 2020   Arthritis    Knees, hips,   CHF (congestive heart failure) (HCC)    Chronic kidney disease    COVID    mild   Elevated troponin 05/24/2016   Finger amputation, no complication    GERD (gastroesophageal reflux disease)    Gout    Heart murmur    never has caused any problems  per patient 04/02/20   Hypercholesteremia    Hypertension    Infection of total left knee replacement 01/25/2020   Lupus (HCC)    Myocardial infarction [facility])    Peripheral vascular disease     Past Surgical History:  Procedure Laterality Date   ABDOMINAL AORTOGRAM W/LOWER EXTREMITY N/A 06/30/2022   Procedure: ABDOMINAL AORTOGRAM W/LOWER EXTREMITY;  Surgeon: Lanis Fonda BRAVO, MD;  Location: [facility] INVASIVE CV LAB;  Service: Cardiovascular;  Laterality: N/A;   AMPUTATION Left 04/04/2020   Procedure: LEFT ABOVE KNEE AMPUTATION;  Surgeon: Harden Jerona GAILS, MD;  Location: [facility] OR;  Service: Orthopedics;  Laterality: Left;   AMPUTATION TOE Right 07/07/2022   Procedure: AMPUTATION TOE (FRIST RIGHT TOE);  Surgeon: Gershon Donnice SAUNDERS, DPM;  Location: [facility] OR;  Service: Podiatry;  Laterality: Right;   CARDIAC CATHETERIZATION  05/25/2016   COLONOSCOPY N/A 01/17/2019   Procedure: COLONOSCOPY;  Surgeon: Shaaron Lamar HERO, MD;  Location: [facility] ENDO SUITE;  Service: Endoscopy;  Laterality: N/A;  10:30   EXCISIONAL TOTAL KNEE ARTHROPLASTY Left 02/05/2020   Procedure: REPEAT IRRIGATION AND DEBRIDEMENT LEFT KNEE;  Surgeon: Vernetta Lonni GRADE, MD;  Location: [facility] ORS;  Service: Orthopedics;  Laterality:  Left;   I & D EXTREMITY Left 02/08/2020   Procedure: EXCISIONAL DEBRIDEMENT LEFT KNEE;  Surgeon: Harden Jerona GAILS, MD;  Location: [facility] OR;  Service: Orthopedics;  Laterality: Left;   I & D KNEE WITH POLY EXCHANGE Left 01/25/2020   Procedure: INCISION AND DRAINAGE LEFT KNEE WITH POLY-LINER EXCHANGE;  Surgeon: Vernetta Lonni GRADE, MD;  Location: [facility] ORS;  Service: Orthopedics;  Laterality: Left;   IRRIGATION AND DEBRIDEMENT KNEE Left 01/30/2020   Procedure: REPEAT IRRIGATION AND DEBRIDEMENT LEFT KNEE PLACEMENT OF WOUND VAC;  Surgeon: Vernetta Lonni GRADE, MD;  Location: [facility] ORS;  Service: Orthopedics;  Laterality: Left;   left finger amputations     PERIPHERAL VASCULAR INTERVENTION  06/30/2022   Procedure: PERIPHERAL VASCULAR  INTERVENTION;  Surgeon: Lanis Fonda BRAVO, MD;  Location: [facility] INVASIVE CV LAB;  Service: Cardiovascular;;   POLYPECTOMY  01/17/2019   Procedure: POLYPECTOMY;  Surgeon: Shaaron Lamar HERO, MD;  Location: [facility];  Service: Endoscopy;;   right hand surgery     right knee arthroscopy     RIGHT/LEFT HEART CATH AND CORONARY ANGIOGRAPHY N/A 05/25/2016   Procedure: Right/Left Heart Cath and Coronary Angiography;  Surgeon: Debby DELENA Sor, MD;  Location: [facility] INVASIVE CV LAB;  Service: Cardiovascular;  Laterality: N/A;   SKIN SPLIT GRAFT Left 02/08/2020   Procedure: SKIN GRAFT SPLIT THICKNESS LEFT KNEE;  Surgeon: Harden Jerona GAILS, MD;  Location: [facility] OR;  Service: Orthopedics;  Laterality: Left;   TOTAL KNEE ARTHROPLASTY Left 01/04/2020   Procedure: LEFT TOTAL KNEE ARTHROPLASTY;  Surgeon: Vernetta Lonni GRADE, MD;  Location: [facility] ORS;  Service: Orthopedics;  Laterality: Left;    No Known Allergies  Prior to Admission medications   Medication Sig Start Date End Date Taking? Authorizing Provider  allopurinol  (ZYLOPRIM ) 100 MG tablet Take 1 tablet (100 mg total) by mouth daily. 04/26/20   Medina-Vargas, Monina C, NP  amLODipine  (NORVASC ) 10 MG tablet Take 10 mg by mouth daily.    [provider]  aspirin  EC 81 MG tablet Take 81 mg by mouth daily. Swallow whole.    [provider]  atenolol  (TENORMIN ) 25 MG tablet Take 1 tablet (25 mg total) by mouth daily. 04/26/20   Medina-Vargas, Monina C, NP  atorvastatin  (LIPITOR ) 40 MG tablet Take 1 tablet (40 mg total) by mouth daily at 6 PM. 04/26/20   Medina-Vargas, Monina C, NP  benazepril  (LOTENSIN ) 40 MG tablet Take 40 mg by mouth daily.    [provider]  cephALEXin  (KEFLEX ) 500 MG capsule Take 1 capsule (500 mg total) by mouth 3 (three) times daily. 07/07/22   Gershon Donnice SAUNDERS, DPM  Ensure (ENSURE) Take 237 mLs by mouth daily as needed (supplementation).    [provider]  furosemide  (LASIX ) 40 MG tablet Take 1 tablet (40 mg  total) by mouth daily. 04/26/20   Medina-Vargas, Monina C, NP  gabapentin  (NEURONTIN ) 300 MG capsule Take 1 capsule (300 mg total) by mouth daily. Patient taking differently: Take 300 mg by mouth 2 (two) times daily. 04/26/20   Medina-Vargas, Monina C, NP  ibuprofen  (ADVIL ) 800 MG tablet Take 800 mg by mouth 2 (two) times daily as needed for moderate pain.    [provider]  omeprazole  (PRILOSEC) 20 MG capsule Take 1 capsule (20 mg total) by mouth daily. 04/26/20   Medina-Vargas, Monina C, NP  potassium chloride  (KLOR-CON ) 10 MEQ tablet Take 1 tablet (10 mEq total) by mouth daily. 04/26/20   Medina-Vargas, Monina C, NP  traZODone (DESYREL)  100 MG tablet Take 150 mg by mouth at bedtime as needed for sleep.    [provider]    Social History   Socioeconomic History   Marital status: Single    Spouse name: Not on file   Number of children: Not on file   Years of education: Not on file   Highest education level: Not on file  Occupational History   Occupation: unemployed  Tobacco Use   Smoking status: Former    Current packs/day: 0.00    Average packs/day: 0.3 packs/day for 15.0 years (3.8 ttl pk-yrs)    Types: Cigarettes    Quit date: 08/30/2022    Years since quitting: 1.2   Smokeless tobacco: Never  Vaping Use   Vaping status: Never Used  Substance and Sexual Activity   Alcohol use: Yes    Alcohol/week: 2.0 - 3.0 standard drinks of alcohol    Types: 2 - 3 Cans of beer per week    Comment: daily   Drug use: Yes    Types: Marijuana    Comment: Last time 04/02/20   Sexual activity: Not Currently    Birth control/protection: Post-menopausal  Other Topics Concern   Not on file  Social History Narrative   Patient has been living alone; however, plans to discharge home to sister, Olivia, house   Social Drivers of Health   Financial Resource Strain: Low Risk  (04/22/2022)   Received from [facility]   Overall Financial Resource Strain (CARDIA)    Difficulty of  Paying Living Expenses: Not hard at all  Food Insecurity: No Food Insecurity (10/15/2023)   Received from [facility]   Hunger Vital Sign    Within the past 12 months, you worried that your food would run out before you got the money to buy more.: Never true    Within the past 12 months, the food you bought just didn't last and you didn't have money to get more.: Never true  Transportation Needs: No Transportation Needs (10/15/2023)   Received from [facility]   PRAPARE - Transportation    Lack of Transportation (Medical): No    Lack of Transportation (Non-Medical): No  Physical Activity: Not on file  Stress: No Stress Concern Present (04/22/2022)   Received from [facility] - Occupational Stress Questionnaire    Feeling of Stress : Not at all  Social Connections: Unknown (04/22/2022)   Received from [facility]   Social Network    Social Network: Not on file  Intimate Partner Violence: Unknown (04/22/2022)   Received from [facility]   HITS    Physically Hurt: Not on file    Insult or Talk Down To: Not on file    Threaten Physical Harm: Not on file    Scream or Curse: Not on file     Family History  Problem Relation Age of Onset   Pancreatic cancer Mother    Colon cancer Father     ROS: [x]  Positive   [ ]  Negative   [ ]  All sytems reviewed and are negative  Cardiac: [x]  hx MI  [x]  hx CHF  Vascular: []  pain in legs while walking [x]  pain in right foot at rest []  pain in legs at night []  non-healing ulcers []  hx of DVT []  swelling in legs  Pulmonary: [x]  hx covid  Neurologic: []  hx of CVA []  mini stroke   Hematologic: [x]  lupus  Endocrine:   []  diabetes []   thyroid  disease  GI []  GERD  GU: []  CKD/renal failure []  HD--[]  M/W/F or []  T/T/S  Psychiatric: []  anxiety []  depression  Musculoskeletal: [x]   gout [x]  arthritis   Integumentary: []  rashes []  ulcers  Constitutional: []  fever  []   chills  Physical Examination  Vitals:   12/08/23 2101 12/08/23 2105  BP: (!) 113/93   Pulse: (!) 46   Resp: 14 11  Temp: 97.7 F (36.5 C)   SpO2: 100%    Body mass index is 20.94 kg/m.  General:  WDWN in NAD Gait: Not observed HENT: WNL, normocephalic Pulmonary: normal non-labored breathing Cardiac: regular Abdomen:  soft, NT Skin: without rashes Vascular Exam/Pulses: Unable to obtain doppler flow right foot; bilateral femoral pulses are palpable Extremities: sensory in tact; she has pain with dorsiflexion; no wounds present right foot Musculoskeletal: no muscle wasting or atrophy  Neurologic: A&O X 3 Psychiatric:  The pt has Normal affect.   CBC    Component Value Date/Time   WBC 7.6 04/06/2020 0341   RBC 2.93 (L) 04/06/2020 0341   HGB 9.9 (L) 06/30/2022 1204   HCT 29.0 (L) 06/30/2022 1204   PLT 164 04/06/2020 0341   MCV 89.1 04/06/2020 0341   MCH 28.7 04/06/2020 0341   MCHC 32.2 04/06/2020 0341   RDW 15.2 04/06/2020 0341   LYMPHSABS 1.7 02/04/2020 0315   MONOABS 1.9 (H) 02/04/2020 0315   EOSABS 0.1 02/04/2020 0315   BASOSABS 0.0 02/04/2020 0315    BMET    Component Value Date/Time   NA 142 06/30/2022 1204   K 3.5 06/30/2022 1204   CL 102 06/30/2022 1204   CO2 24 04/04/2020 0705   GLUCOSE 74 06/30/2022 1204   BUN 3 (L) 06/30/2022 1204   CREATININE 0.70 06/30/2022 1204   CALCIUM  9.1 04/04/2020 0705   GFRNONAA >60 04/04/2020 0705   GFRAA >60 12/07/2016 0012    COAGS: Lab Results  Component Value Date   INR 0.98 12/06/2016   INR 1.15 05/25/2016      ASSESSMENT/PLAN: This is a 58 y.o. female with  hx of aortogram drug eluting stenting of the SFA, balloon angioplasty of the PTA on 06/30/2022 for critical limb ischemia with tissue loss by Dr. Lanis.  She had right great toe amputation on 07/07/2022 by Dr. Gershon.  She has hx of left AKA 04/04/2020 by Dr. Harden.     -pt seen in ER for several day hx of right foot pain and does not have any doppler flow  at the foot.  She is sensory and motor in tact. Plan to admit to medicine service and we will plan for angiogram tomorrow to evaluate RLE with possible intervention.   -Npo after MN, labs, consent ordered -start heparin  in ER  -pt seen with Dr. Pearline Lucie Apt, PA-C [facility] [phone]   VASCULAR STAFF ADDENDUM: I have independently interviewed and examined the patient. I agree with the above.  58 year old female with history of right SFA stenting and PT balloon angioplasty on May 03/2022 with Dr. Silver.  At that time she had toe amputation which is healed.  She also has history of a left AKA and was performed by Dr. Harden in 2022.  She now has multiple days of right foot pain.  She is motor and sensory intact although does not have a Doppler signal at the foot. Plan for heparin  drip and medicine admission and angiogram tomorrow.  Norman GORMAN Pearline MD [facility]  Office Phone Number: [phone] 12/08/2023 9:29 PM

## 2023-12-08 NOTE — Assessment & Plan Note (Signed)
 States in remission

## 2023-12-08 NOTE — Assessment & Plan Note (Signed)
 Hold beta-blocker given bradycardia for tonight hold aspirin  as patient is on full dose of heparin  for right now

## 2023-12-08 NOTE — Subjective & Objective (Signed)
 Presents for a few days of right foot pain has a known history of peripheral arterial disease has a drug-eluting stent of SFA with balloon angioplasty of PTA done in May 2024 for critical limb ischemia she has already had left AKA done in 2022  Patient has known history of scleroderma Pt initially presented to [facility] transferred to [facility] Vascular surgery No pulse noted in DP  Heparin  started plan for angiogram tomorrow

## 2023-12-08 NOTE — Assessment & Plan Note (Signed)
 Hold atenolol  given bradycardia Allow permissive htn for tonight

## 2023-12-08 NOTE — ED Provider Notes (Signed)
 [place] EMERGENCY DEPARTMENT AT [facility] Provider Note   CSN: [id number] Arrival date & time: 12/08/23  2050     Patient presents with: No chief complaint on file.   Crystal Patterson is a 58 y.o. female.   HPI   Patient is a 58 year old female with PMH of SLE and PAD c/b critical limb ischemia with prior vascular interventions to the bilateral lower extremities s/p left AKA, presenting to the ED from [facility] with concern for critical limb ischemia of the RLE in the setting of 24 hours of worsening foot pain.  Patient denies any recent falls or trauma.  She denies any recent headaches, vision changes, chest pain, shortness of breath, fevers, chills, cough, congestion, changes in urination, changes in bowel movements  Prior to Admission medications   Medication Sig Start Date End Date Taking? Authorizing Provider  allopurinol  (ZYLOPRIM ) 100 MG tablet Take 1 tablet (100 mg total) by mouth daily. 04/26/20   Medina-Vargas, Monina C, NP  amLODipine  (NORVASC ) 10 MG tablet Take 10 mg by mouth daily.    [provider]  aspirin  EC 81 MG tablet Take 81 mg by mouth daily. Swallow whole.    [provider]  atenolol  (TENORMIN ) 25 MG tablet Take 1 tablet (25 mg total) by mouth daily. 04/26/20   Medina-Vargas, Monina C, NP  atorvastatin  (LIPITOR ) 40 MG tablet Take 1 tablet (40 mg total) by mouth daily at 6 PM. 04/26/20   Medina-Vargas, Monina C, NP  benazepril  (LOTENSIN ) 40 MG tablet Take 40 mg by mouth daily.    [provider]  cephALEXin  (KEFLEX ) 500 MG capsule Take 1 capsule (500 mg total) by mouth 3 (three) times daily. 07/07/22   Gershon Donnice SAUNDERS, DPM  Ensure (ENSURE) Take 237 mLs by mouth daily as needed (supplementation).    [provider]  furosemide  (LASIX ) 40 MG tablet Take 1 tablet (40 mg total) by mouth daily. 04/26/20   Medina-Vargas, Monina C, NP  gabapentin  (NEURONTIN ) 300 MG capsule Take 1 capsule (300 mg total) by mouth  daily. Patient taking differently: Take 300 mg by mouth 2 (two) times daily. 04/26/20   Medina-Vargas, Monina C, NP  ibuprofen  (ADVIL ) 800 MG tablet Take 800 mg by mouth 2 (two) times daily as needed for moderate pain.    [provider]  omeprazole  (PRILOSEC) 20 MG capsule Take 1 capsule (20 mg total) by mouth daily. 04/26/20   Medina-Vargas, Monina C, NP  potassium chloride  (KLOR-CON ) 10 MEQ tablet Take 1 tablet (10 mEq total) by mouth daily. 04/26/20   Medina-Vargas, Monina C, NP  traZODone (DESYREL) 100 MG tablet Take 150 mg by mouth at bedtime as needed for sleep.    [provider]    Allergies: Patient has no known allergies.    Review of Systems  Updated Vital Signs BP (!) 113/93 (BP Location: Right Arm)   Pulse (!) 46   Temp 97.7 F (36.5 C) (Oral)   Resp 11   Ht 5' 4 (1.626 m)   Wt 55.3 kg   LMP  (LMP Unknown)   SpO2 100%   BMI 20.94 kg/m   Physical Exam HENT:     Head: Normocephalic and atraumatic.     Right Ear: External ear normal.     Left Ear: External ear normal.     Mouth/Throat:     Mouth: Mucous membranes are moist.     Pharynx: Oropharynx is clear.  Eyes:     Extraocular Movements: Extraocular  movements intact.     Pupils: Pupils are equal, round, and reactive to light.  Cardiovascular:     Rate and Rhythm: Regular rhythm. Bradycardia present.     Comments: Absent palpable pulses RLE Pulmonary:     Effort: Pulmonary effort is normal.     Breath sounds: Normal breath sounds.  Abdominal:     General: Abdomen is flat.     Palpations: Abdomen is soft.  Musculoskeletal:     Comments: Port left chest wall Multiple missing digits bilateral upper extremities +L AKA  Skin:    General: Skin is warm and dry.     Capillary Refill: Capillary refill takes less than 2 seconds.  Neurological:     General: No focal deficit present.     Mental Status: She is oriented to person, place, and time.  Psychiatric:        Mood and Affect: Mood  normal.     (all labs ordered are listed, but only abnormal results are displayed) Labs Reviewed  PROTIME-INR - Abnormal; Notable for the following components:      Result Value   Prothrombin Time 15.3 (*)    All other components within normal limits  I-STAT CHEM 8, ED - Abnormal; Notable for the following components:   Potassium 2.7 (*)    BUN <3 (*)    Calcium , Ion 1.11 (*)    Hemoglobin 11.6 (*)    HCT 34.0 (*)    All other components within normal limits  CBC WITH DIFFERENTIAL/PLATELET  BASIC METABOLIC PANEL WITH GFR  HEPARIN  LEVEL (UNFRACTIONATED)  CBC  BASIC METABOLIC PANEL WITH GFR  CK  MAGNESIUM   PHOSPHORUS  HEPATIC FUNCTION PANEL  LACTIC ACID, PLASMA  LACTIC ACID, PLASMA  CBG MONITORING, ED    EKG: None  Radiology: No results found.   Procedures   Medications Ordered in the ED  heparin  ADULT infusion 100 units/mL (25000 units/250mL) (900 Units/hr Intravenous New Bag/Given 12/08/23 2127)  heparin  bolus via infusion 3,200 Units (3,200 Units Intravenous Bolus from Bag 12/08/23 2127)                                  Medical Decision Making Amount and/or Complexity of Data Reviewed Labs: ordered.  Risk Prescription drug management. Decision regarding hospitalization.  58 year old female with PMH of SLE and PAD s/p left AKA presenting to the ED as a transfer from [facility] in setting of critical limb ischemia with right femoral artery occlusion on outside imaging and no appreciable dopplerable pulses in setting of x 1 day of worsening RLE pain.  On arrival, patient hemodynamically stable.  Bradycardic though asymptomatic.  Afebrile.  Normotensive.  No tachycardia or tachypnea.  Saturating 100% RA.  Discussed with vascular surgery, patient to go to angiogram tomorrow morning per vascular.  Patient promptly initiated on heparin  gtt.  Hospitalist service consulted -patient to be admitted for ongoing medical management.  Final diagnoses:  Critical limb  ischemia of right lower extremity [facility])    ED Discharge Orders     None          Dorcus Fallow, MD 12/08/23 2159    Patt Alm Macho, MD 12/12/23 [phone]

## 2023-12-08 NOTE — Assessment & Plan Note (Signed)
 Chronic stable

## 2023-12-08 NOTE — Assessment & Plan Note (Signed)
 Continue Heparin   Vascular surgery are aware Plan for angiogram in AM Npo post midnight

## 2023-12-08 NOTE — Assessment & Plan Note (Signed)
 Patient states for the past 2 weeks she has an ongoing Foley catheter.   Once more stable could try to liberate

## 2023-12-08 NOTE — ED Triage Notes (Signed)
 Pt BIB [facility] who reports pt is here for superficial R femoral artery occlusion. Reports no doppler pulses.    [facility] reports prior Cbg 36, amp d50 cbg 89 in transport. L chest port that is accessed on arrival. Chronic foley. L BKA, HR SB

## 2023-12-08 NOTE — Nursing Note (Signed)
 PICC line team unable to start line today, ETA tomm. Made primary nurse Warren aware of this.

## 2023-12-08 NOTE — Assessment & Plan Note (Signed)
 Appreciate vascular surgery consult

## 2023-12-08 NOTE — ED Notes (Signed)
 Cbg 71, pt provided orange juice and sandwhich bag.

## 2023-12-08 NOTE — Assessment & Plan Note (Signed)
Chronic stable continue home meds

## 2023-12-08 NOTE — Assessment & Plan Note (Signed)
-  will monitor on tele avoid QT prolonging medications, rehydrate correct electrolytes ? ?

## 2023-12-08 NOTE — Assessment & Plan Note (Signed)
 Repeat labs and replace as needed check magnesium  level

## 2023-12-08 NOTE — Assessment & Plan Note (Signed)
 Ongoing patient states she continues to use

## 2023-12-08 NOTE — Progress Notes (Signed)
 PHARMACY - ANTICOAGULATION CONSULT NOTE  Pharmacy Consult for heparin  Indication: VTE treatment  No Known Allergies  Patient Measurements:    Vital Signs:    Labs: No results for input(s): HGB, HCT, PLT, APTT, LABPROT, INR, HEPARINUNFRC, HEPRLOWMOCWT, CREATININE, CKTOTAL, CKMB, TROPONINIHS in the last 72 hours.  CrCl cannot be calculated (Patient's most recent lab result is older than the maximum 21 days allowed.).   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  Goal of Therapy:  Heparin  level 0.3-0.7 units/ml Monitor platelets by anticoagulation protocol: Yes   Plan:  Heparin  3200 units IV x 1, and gtt at 900 units/hr F/u 6 hour heparin  level F/u long term [facility] plan  Dorn Poot, PharmD, [facility] Clinical Pharmacist ED Pharmacist Phone # [phone] 12/08/2023 9:08 PM

## 2023-12-08 NOTE — Assessment & Plan Note (Signed)
 Monitor blood sugars every 2 hours especially as patient is n.p.o. for procedure.

## 2023-12-08 NOTE — ED Triage Notes (Signed)
 Patient reports a burning pain in right leg for 2 days. Is a AKA on left side. Has had great toe amputated on right side. Denies injury and wounds

## 2023-12-08 NOTE — H&P (Signed)
 Crystal Patterson:[id number] DOB: [birth year] DOA: 12/08/2023     PCP: Carlette Benita Area, MD   Outpatient Specialists:   CARDS:   Dr. Alvan Carrier, MD   POdiatry Dr. Gershon Orthopedics Dr. Harden Patient arrived to ER on 12/08/23 at 2050 Referred by Attending Patt Alm Macho, MD   Patient coming from:    home Lives alone,     Chief Complaint: right foot pain    HPI: Crystal Patterson is a 58 y.o. female with medical history significant of HTN , PAD, left AKA, CKD, scleroderma, SLE, GERD, gout    Presented with right foot pain Presents for a few days of right foot pain has a known history of peripheral arterial disease has a drug-eluting stent of SFA with balloon angioplasty of PTA done in May 2024 for critical limb ischemia she has already had left AKA done in 2022  Patient has known history of scleroderma Pt initially presented to [facility] transferred to [facility] surgery No pulse noted in DP  Heparin  started plan for angiogram tomorrow     Denies significant ETOH intake   marijuana use      Regarding pertinent Chronic problems:     Hyperlipidemia -  on statins Lipitor  (atorvastatin )  Lipid Panel     Component Value Date/Time   CHOL 134 05/25/2016 0543   TRIG 63 05/25/2016 0543   HDL 39 (L) 05/25/2016 0543   CHOLHDL 3.4 05/25/2016 0543   VLDL 13 05/25/2016 0543   LDLCALC 82 05/25/2016 0543     HTN on NOrvasc , atenolol , Benazepril , lasix     While in ER: Clinical Course as of 12/08/23 2140  Thu Dec 08, 2023  2100 Angiogram tomorrow a.m. per [WB]  2104 Per Catarino right femoral artery occlusion, on OSH with no dopplerable pulses. [WB]    Clinical Course User Index [WB] Beam, Elsie, MD       Lab Orders         CBC with Differential         Basic metabolic panel         Protime-INR         Heparin  level (unfractionated)         Heparin  level (unfractionated)         CBC         Basic metabolic panel with GFR         I-stat chem 8,  ED (not at [facility], [facility] or [facility])         CBG monitoring, ED      Following Medications were ordered in ER: Medications  heparin  ADULT infusion 100 units/mL (25000 units/250mL) (900 Units/hr Intravenous New Bag/Given 12/08/23 2127)  heparin  bolus via infusion 3,200 Units (3,200 Units Intravenous Bolus from Bag 12/08/23 2127)    _______________________________________________________ ER Provider Called:    Vascular surgery Dr.Buckley They Recommend admit to medicine    SEEN in ER     ED Triage Vitals  Encounter Vitals Group     BP 12/08/23 2101 (!) 113/93     Girls Systolic BP Percentile --      Girls Diastolic BP Percentile --      Boys Systolic BP Percentile --      Boys Diastolic BP Percentile --      Pulse Rate 12/08/23 2101 (!) 46     Resp 12/08/23 2101 14     Temp 12/08/23 2101 97.7 F (36.5 C)     Temp Source 12/08/23 2101  Oral     SpO2 12/08/23 2101 100 %     Weight 12/08/23 2104 122 lb (55.3 kg)     Height 12/08/23 2104 5' 4 (1.626 m)     Head Circumference --      Peak Flow --      Pain Score 12/08/23 2104 9     Pain Loc --      Pain Education --      Exclude from Growth Chart --   UFJK(75)@     _________________________________________ Significant initial  Findings: Abnormal Labs Reviewed  I-STAT CHEM 8, ED - Abnormal; Notable for the following components:      Result Value   Potassium 2.7 (*)    BUN <3 (*)    Calcium , Ion 1.11 (*)    Hemoglobin 11.6 (*)    HCT 34.0 (*)    All other components within normal limits      ECG: Ordered Personally reviewed and interpreted by me showing: HR : 46 Rhythm: Sinus bradycardia Incomplete left bundle branch block Anterior Q waves, possibly due to ILBBB Prolonged QT interval QTC 539   The recent clinical data is shown below. Vitals:   12/08/23 2101 12/08/23 2104 12/08/23 2105  BP: (!) 113/93    Pulse: (!) 46    Resp: 14  11  Temp: 97.7 F (36.5 C)    TempSrc: Oral    SpO2: 100%    Weight:  55.3 kg    Height:  5' 4 (1.626 m)     WBC     Component Value Date/Time   WBC 7.6 04/06/2020 0341   LYMPHSABS 1.7 02/04/2020 0315   MONOABS 1.9 (H) 02/04/2020 0315   EOSABS 0.1 02/04/2020 0315   BASOSABS 0.0 02/04/2020 0315          __________________________________________________________ Recent Labs  Lab 12/08/23 2138  NA 139  K 2.7*  GLUCOSE 75  BUN <3*  CREATININE 0.60    Cr   stable,   Lab Results  Component Value Date   CREATININE 0.60 12/08/2023   CREATININE 0.70 06/30/2022   CREATININE 0.71 04/04/2020    No results for input(s): AST, ALT, ALKPHOS, BILITOT, PROT, ALBUMIN  in the last 168 hours. Lab Results  Component Value Date   CALCIUM  9.1 04/04/2020    Plt: Lab Results  Component Value Date   PLT 164 04/06/2020         Recent Labs  Lab 12/08/23 2138  HGB 11.6*  HCT 34.0*    HG/HCT   stable,      Component Value Date/Time   HGB 11.6 (L) 12/08/2023 2138   HCT 34.0 (L) 12/08/2023 2138   MCV 89.1 04/06/2020 0341        _______________________________________________ Hospitalist was called for admission for   Critical limb ischemia of right lower extremity   The following Work up has been ordered so far:  Orders Placed This Encounter  Procedures   CBC with Differential   Basic metabolic panel   Protime-INR   Heparin  level (unfractionated)   Heparin  level (unfractionated)   CBC   Basic metabolic panel with GFR   Diet NPO time specified   Informed Consent Details: Physician/Practitioner Attestation; Transcribe to consent form and obtain patient signature   heparin  per pharmacy consult   Consult for [facility] Admission   I-stat chem 8, ED (not at [facility], [facility] or [facility])   CBG monitoring, ED   EKG   EKG   EKG   EKG  EKG 12-Lead     OTHER Significant initial  Findings:  labs showing:     DM  labs:  HbA1C: No results for input(s): HGBA1C in the last 8760 hours.     CBG (last 3)  Recent Labs    12/08/23 2133   GLUCAP 71          Cultures:    Component Value Date/Time   SDES TISSUE 02/08/2020 1139   SPECREQUEST LEFT KNEE TISSUE SPEC A 02/08/2020 1139   CULT  02/08/2020 1139    RARE PROTEUS MIRABILIS DR. BLACKMAN NOTIFIED VIA EPIC MESSAGE REGARDING CULTURE GROWTH NO ANAEROBES ISOLATED Performed at [facility], [address] [place], [place] [address]    REPTSTATUS 02/13/2020 FINAL 02/08/2020 1139     Radiological Exams on Admission: No results found. _______________________________________________________________________________________________________ Latest  Blood pressure (!) 113/93, pulse (!) 46, temperature 97.7 F (36.5 C), temperature source Oral, resp. rate 11, height 5' 4 (1.626 m), weight 55.3 kg, SpO2 100%.   Vitals  labs and radiology finding personally reviewed  Review of Systems:    Pertinent positives include: right foot pain   Constitutional:  No weight loss, night sweats, Fevers, chills, fatigue, weight loss  HEENT:  No headaches, Difficulty swallowing,Tooth/dental problems,Sore throat,  No sneezing, itching, ear ache, nasal congestion, post nasal drip,  Cardio-vascular:  No chest pain, Orthopnea, PND, anasarca, dizziness, palpitations.no Bilateral lower extremity swelling  GI:  No heartburn, indigestion, abdominal pain, nausea, vomiting, diarrhea, change in bowel habits, loss of appetite, melena, blood in stool, hematemesis Resp:  no shortness of breath at rest. No dyspnea on exertion, No excess mucus, no productive cough, No non-productive cough, No coughing up of blood.No change in color of mucus.No wheezing. Skin:  no rash or lesions. No jaundice GU:  no dysuria, change in color of urine, no urgency or frequency. No straining to urinate.  No flank pain.  Musculoskeletal:  No joint pain or no joint swelling. No decreased range of motion. No back pain.  Psych:  No change in mood or affect. No depression or anxiety. No memory loss.  Neuro: no  localizing neurological complaints, no tingling, no weakness, no double vision, no gait abnormality, no slurred speech, no confusion  All systems reviewed and apart from HOPI all are negative _______________________________________________________________________________________________ Past Medical History:   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   Lupus (HCC)    Myocardial infarction [facility])    Peripheral vascular disease       Past Surgical History:  Procedure Laterality Date   ABDOMINAL AORTOGRAM W/LOWER EXTREMITY N/A 06/30/2022   Procedure: ABDOMINAL AORTOGRAM W/LOWER EXTREMITY;  Surgeon: Lanis Fonda BRAVO, MD;  Location: [facility] INVASIVE CV LAB;  Service: Cardiovascular;  Laterality: N/A;   AMPUTATION Left 04/04/2020   Procedure: LEFT ABOVE KNEE AMPUTATION;  Surgeon: Harden Jerona GAILS, MD;  Location: [facility] OR;  Service: Orthopedics;  Laterality: Left;   AMPUTATION TOE Right 07/07/2022   Procedure: AMPUTATION TOE (FRIST RIGHT TOE);  Surgeon: Gershon Donnice SAUNDERS, DPM;  Location: [facility] OR;  Service: Podiatry;  Laterality: Right;   CARDIAC CATHETERIZATION  05/25/2016   COLONOSCOPY N/A 01/17/2019   Procedure: COLONOSCOPY;  Surgeon: Shaaron Lamar HERO, MD;  Location: [facility] ENDO SUITE;  Service: Endoscopy;  Laterality: N/A;  10:30   EXCISIONAL TOTAL KNEE ARTHROPLASTY Left 02/05/2020   Procedure: REPEAT IRRIGATION AND DEBRIDEMENT LEFT KNEE;  Surgeon: Vernetta Lonni GRADE, MD;  Location: [facility] ORS;  Service: Orthopedics;  Laterality: Left;   I & D EXTREMITY Left 02/08/2020   Procedure: EXCISIONAL  DEBRIDEMENT LEFT KNEE;  Surgeon: Harden Jerona GAILS, MD;  Location: [facility] OR;  Service: Orthopedics;  Laterality: Left;   I & D KNEE WITH POLY EXCHANGE Left 01/25/2020   Procedure: INCISION AND DRAINAGE LEFT KNEE WITH POLY-LINER EXCHANGE;  Surgeon: Vernetta Lonni GRADE, MD;  Location: [facility] ORS;  Service: Orthopedics;  Laterality: Left;   IRRIGATION AND DEBRIDEMENT KNEE Left 01/30/2020   Procedure: REPEAT IRRIGATION AND DEBRIDEMENT LEFT KNEE PLACEMENT OF WOUND VAC;  Surgeon: Vernetta Lonni GRADE, MD;  Location: [facility] ORS;  Service: Orthopedics;  Laterality: Left;   left finger amputations     PERIPHERAL VASCULAR INTERVENTION  06/30/2022   Procedure: PERIPHERAL VASCULAR INTERVENTION;  Surgeon: Lanis Fonda BRAVO, MD;  Location: [facility] INVASIVE CV LAB;  Service: Cardiovascular;;   POLYPECTOMY  01/17/2019   Procedure: POLYPECTOMY;  Surgeon: Shaaron Lamar HERO, MD;  Location: [facility];  Service: Endoscopy;;   right hand surgery     right knee arthroscopy     RIGHT/LEFT HEART CATH AND CORONARY ANGIOGRAPHY N/A 05/25/2016   Procedure: Right/Left Heart Cath and Coronary Angiography;  Surgeon: Debby DELENA Sor, MD;  Location: [facility] INVASIVE CV LAB;  Service: Cardiovascular;  Laterality: N/A;   SKIN SPLIT GRAFT Left 02/08/2020   Procedure: SKIN GRAFT SPLIT THICKNESS LEFT KNEE;  Surgeon: Harden Jerona GAILS, MD;  Location: [facility] OR;  Service: Orthopedics;  Laterality: Left;   TOTAL KNEE ARTHROPLASTY Left 01/04/2020   Procedure: LEFT TOTAL KNEE ARTHROPLASTY;  Surgeon: Vernetta Lonni GRADE, MD;  Location: [facility] ORS;  Service: Orthopedics;  Laterality: Left;    Social History:  Ambulatory   walker  wheelchair bound,      reports that she quit smoking about 15 months ago. Her smoking use included cigarettes. She has a 3.8 pack-year smoking history. She has never used smokeless tobacco. She reports current alcohol use of about 2.0 - 3.0 standard drinks of alcohol per week. She reports current drug use. Drug: Marijuana.   Family  History:   Family History  Problem Relation Age of Onset   Pancreatic cancer Mother    Colon cancer Father    ______________________________________________________________________________________________ Allergies: No Known Allergies   Prior to Admission medications   Medication Sig Start Date End Date Taking? Authorizing Provider  allopurinol  (ZYLOPRIM ) 100 MG tablet Take 1 tablet (100 mg total) by mouth daily. 04/26/20   Medina-Vargas, Monina C, NP  amLODipine  (NORVASC ) 10 MG tablet Take 10 mg by mouth daily.    [provider]  aspirin  EC 81 MG tablet Take 81 mg by mouth daily. Swallow whole.    [provider]  atenolol  (TENORMIN ) 25 MG tablet Take 1 tablet (25 mg total) by mouth daily. 04/26/20   Medina-Vargas, Monina C, NP  atorvastatin  (LIPITOR ) 40 MG tablet Take 1 tablet (40 mg total) by mouth daily at 6 PM. 04/26/20   Medina-Vargas, Monina C, NP  benazepril  (LOTENSIN ) 40 MG tablet Take 40 mg by mouth daily.  [provider]  cephALEXin  (KEFLEX ) 500 MG capsule Take 1 capsule (500 mg total) by mouth 3 (three) times daily. 07/07/22   Gershon Donnice SAUNDERS, DPM  Ensure (ENSURE) Take 237 mLs by mouth daily as needed (supplementation).    [provider]  furosemide  (LASIX ) 40 MG tablet Take 1 tablet (40 mg total) by mouth daily. 04/26/20   Medina-Vargas, Monina C, NP  gabapentin  (NEURONTIN ) 300 MG capsule Take 1 capsule (300 mg total) by mouth daily. Patient taking differently: Take 300 mg by mouth 2 (two) times daily. 04/26/20   Medina-Vargas, Monina C, NP  ibuprofen  (ADVIL ) 800 MG tablet Take 800 mg by mouth 2 (two) times daily as needed for moderate pain.    [provider]  omeprazole  (PRILOSEC) 20 MG capsule Take 1 capsule (20 mg total) by mouth daily. 04/26/20   Medina-Vargas, Monina C, NP  potassium chloride  (KLOR-CON ) 10 MEQ tablet Take 1 tablet (10 mEq total) by mouth daily. 04/26/20   Medina-Vargas, Monina C, NP  traZODone (DESYREL) 100 MG  tablet Take 150 mg by mouth at bedtime as needed for sleep.    [provider]    ___________________________________________________________________________________________________ Physical Exam:    12/08/2023    9:04 PM 12/08/2023    9:01 PM 09/17/2022    9:09 AM  Vitals with BMI  Height 5' 4  5' 4  Weight 122 lbs  145 lbs  BMI 20.93  24.88  Systolic  113 123  Diastolic  93 87  Pulse  46 91     1. General:  in No  Acute distress   Chronically ill   -appearing 2. Psychological: Alert and   Oriented 3. Head/ENT:    Dry Mucous Membranes                          Head Non traumatic, neck supple                         Poor Dentition 4. SKIN: decreased Skin turgor,  Skin clean Dry and intact no rash    5. Heart: Regular rate and rhythm no  Murmur, no Rub or gallop 6. Lungs:  no wheezes or crackles   7. Abdomen: Soft,  non-tender, Non distended bowel sounds present Foley in place 8. Lower extremities: no clubbing, cyanosis, no  edema right lower extremity cool to the touch, left surgically absent multiple amputations of digits of bilateral upper extremities 9. Neurologically Grossly intact, moving all 4 extremities equally  10. MSK: Normal range of motion    Chart has been reviewed  ______________________________________________________________________________________________  Assessment/Plan  58 y.o. female with medical history significant of HTN , PAD, left AKA, CKD, scleroderma, SLE, GERD, gout  Admitted for   Critical limb ischemia of right lower extremity    Present on Admission:  Critical limb ischemia of right lower extremity (HCC)  Prolonged QT interval  Essential hypertension  HYPOKALEMIA  PAD (peripheral artery disease)  Asthma  CAD (coronary artery disease)  Marijuana use  Tobacco use disorder  Hypoglycemia  Acute urinary retention    Prolonged QT interval - will monitor on tele avoid QT prolonging medications, rehydrate correct  electrolytes   Critical limb ischemia of right lower extremity (HCC) Continue Heparin   Vascular surgery are aware Plan for angiogram in AM Npo post midnight   Essential hypertension Hold atenolol  given bradycardia Allow permissive htn for tonight  History of lupus nephritis Chronic  stable  HYPOKALEMIA Repeat labs and replace as needed check magnesium  level   PAD (peripheral artery disease) Appreciate vascular surgery consult  Asthma Chronic stable continue home meds  CAD (coronary artery disease) Hold beta-blocker given bradycardia for tonight hold aspirin  as patient is on full dose of heparin  for right now  Marijuana use Ongoing patient states she continues to use  Tobacco use disorder States in remission  Hypoglycemia Monitor blood sugars every 2 hours especially as patient is n.p.o. for procedure.  Acute urinary retention Patient states for the past 2 weeks she has an ongoing Foley catheter.   Once more stable could try to liberate   Other plan as per orders.  DVT prophylaxis:  heparin      Code Status:    Code Status: Prior FULL CODE  as per patient   I had personally discussed CODE STATUS with patient  ACP   none     Family Communication:   Family not at  Bedside    Diet  Diet Orders (From admission, onward)     Start     Ordered   12/09/23 0001  Diet NPO time specified  Diet effective midnight        12/08/23 2126            Disposition Plan:      To home once workup is complete and patient is stable   Following barriers for discharge:                                                        Electrolytes corrected                                                            Pain controlled with PO medications                                                         Will need consultants to evaluate patient prior to discharge                                           Consults called:   Treatment Team:  Pearline Norman RAMAN, MD  Admission  status:  ED Disposition     ED Disposition  Admit   Condition  --   Comment  Hospital Area: [facility] [[id number]]  Level of Care: Progressive [102]  Admit to Progressive based on following criteria: MULTISYSTEM THREATS such as stable sepsis, metabolic/electrolyte imbalance with or without encephalopathy that is responding to early treatment.  May admit patient to [facility] or [facility] if equivalent level of care is available:: No  Covid Evaluation: Asymptomatic - no recent exposure (last 10 days) testing not required  Diagnosis: Critical limb ischemia of right lower extremity [facility]) [[id number]]  Admitting Physician: Alverna Fawley [[id number]]  Attending Physician: Faustino Luecke,  Debria Broecker [[id number]]  Certification:: I certify this patient will need inpatient services for at least 2 midnights  Expected Medical Readiness: 12/11/2023            inpatient     I Expect 2 midnight stay secondary to severity of patient's current illness need for inpatient interventions justified by the following:     Severe lab/radiological/exam abnormalities including:    Critical limb ischemia of right lower extremity (HCC)    and extensive comorbidities including:  History of amputation    That are currently affecting medical management.   I expect  patient to be hospitalized for 2 midnights requiring inpatient medical care.  Patient is at high risk for adverse outcome (such as loss of life or disability) if not treated.  Indication for inpatient stay as follows:   inability to maintain oral hydration     Need for operative/procedural  intervention    Need for IV antibiotics, IV fluids,, IV pain medications, IV anticoagulation,  IV rate controling medications, IV antihypertensives need for biPAP Frequent labs   Level of care       progressive      Terris Bodin 12/08/2023, 11:17 PM    [facility]     after 2 AM please page floor coverage   If 7AM-7PM, please  contact the day team taking care of the patient using [email or web address]

## 2023-12-09 ENCOUNTER — Encounter (HOSPITAL_COMMUNITY)
Admission: EM | Disposition: A | Discharge status: Home / Self Care | Payer: Self-pay | Source: Other Acute Inpatient Hospital | Attending: Internal Medicine

## 2023-12-09 ENCOUNTER — Other Ambulatory Visit (HOSPITAL_COMMUNITY): Payer: Self-pay

## 2023-12-09 ENCOUNTER — Inpatient Hospital Stay (HOSPITAL_COMMUNITY)

## 2023-12-09 ENCOUNTER — Telehealth (HOSPITAL_COMMUNITY): Payer: Self-pay | Admitting: Pharmacy Technician

## 2023-12-09 DIAGNOSIS — I3481 Nonrheumatic mitral (valve) annulus calcification: Secondary | ICD-10-CM | POA: Diagnosis not present

## 2023-12-09 DIAGNOSIS — T82858A Stenosis of vascular prosthetic devices, implants and grafts, initial encounter: Secondary | ICD-10-CM

## 2023-12-09 DIAGNOSIS — I70221 Atherosclerosis of native arteries of extremities with rest pain, right leg: Secondary | ICD-10-CM | POA: Diagnosis not present

## 2023-12-09 DIAGNOSIS — Z9889 Other specified postprocedural states: Secondary | ICD-10-CM

## 2023-12-09 DIAGNOSIS — R0600 Dyspnea, unspecified: Secondary | ICD-10-CM | POA: Diagnosis not present

## 2023-12-09 DIAGNOSIS — T82868A Thrombosis of vascular prosthetic devices, implants and grafts, initial encounter: Secondary | ICD-10-CM

## 2023-12-09 DIAGNOSIS — Z452 Encounter for adjustment and management of vascular access device: Secondary | ICD-10-CM | POA: Diagnosis not present

## 2023-12-09 HISTORY — PX: PERIPHERAL VASCULAR ULTRASOUND/IVUS: CATH118334

## 2023-12-09 HISTORY — PX: LOWER EXTREMITY INTERVENTION: CATH118252

## 2023-12-09 HISTORY — PX: ABDOMINAL AORTOGRAM W/LOWER EXTREMITY: CATH118223

## 2023-12-09 LAB — COMPREHENSIVE METABOLIC PANEL WITH GFR
ALT: 12 U/L (ref 0–44)
AST: 20 U/L (ref 15–41)
Albumin: 2.6 g/dL — ABNORMAL LOW (ref 3.5–5.0)
Alkaline Phosphatase: 68 U/L (ref 38–126)
Anion gap: 9 (ref 5–15)
BUN: <5 mg/dL — ABNORMAL LOW (ref 6–20)
CO2: 27 mmol/L (ref 22–32)
Calcium: 8.2 mg/dL — ABNORMAL LOW (ref 8.9–10.3)
Chloride: 104 mmol/L (ref 98–111)
Creatinine, Ser: 0.68 mg/dL (ref 0.44–1.00)
GFR, Estimated: >60 mL/min (ref >60)
Glucose, Bld: 92 mg/dL (ref 70–99)
Potassium: 3.9 mmol/L (ref 3.5–5.1)
Sodium: 140 mmol/L (ref 135–145)
Total Bilirubin: 0.8 mg/dL (ref 0.0–1.2)
Total Protein: 6.2 g/dL — ABNORMAL LOW (ref 6.5–8.1)

## 2023-12-09 LAB — HEPATIC FUNCTION PANEL
ALT: 12 U/L (ref 0–44)
AST: 23 U/L (ref 15–41)
Albumin: 2.8 g/dL — ABNORMAL LOW (ref 3.5–5.0)
Alkaline Phosphatase: 70 U/L (ref 38–126)
Bilirubin, Direct: 0.3 mg/dL — ABNORMAL HIGH (ref 0.0–0.2)
Indirect Bilirubin: 0.8 mg/dL (ref 0.3–0.9)
Total Bilirubin: 1.1 mg/dL (ref 0.0–1.2)
Total Protein: 6.6 g/dL (ref 6.5–8.1)

## 2023-12-09 LAB — HIV ANTIBODY (ROUTINE TESTING W REFLEX): HIV Screen 4th Generation wRfx: NONREACTIVE

## 2023-12-09 LAB — CBC
HCT: 34.2 % — ABNORMAL LOW (ref 36.0–46.0)
HCT: 31.4 % — ABNORMAL LOW (ref 36.0–46.0)
Hemoglobin: 10.8 g/dL — ABNORMAL LOW (ref 12.0–15.0)
Hemoglobin: 9.8 g/dL — ABNORMAL LOW (ref 12.0–15.0)
MCH: 30 pg (ref 26.0–34.0)
MCH: 30 pg (ref 26.0–34.0)
MCHC: 31.6 g/dL (ref 30.0–36.0)
MCHC: 31.2 g/dL (ref 30.0–36.0)
MCV: 95 fL (ref 80.0–100.0)
MCV: 96 fL (ref 80.0–100.0)
Platelets: 143 K/uL — ABNORMAL LOW (ref 150–400)
Platelets: 124 K/uL — ABNORMAL LOW (ref 150–400)
RBC: 3.6 MIL/uL — ABNORMAL LOW (ref 3.87–5.11)
RBC: 3.27 MIL/uL — ABNORMAL LOW (ref 3.87–5.11)
RDW: 13.8 % (ref 11.5–15.5)
RDW: 13.9 % (ref 11.5–15.5)
WBC: 6.4 K/uL (ref 4.0–10.5)
WBC: 12.2 K/uL — ABNORMAL HIGH (ref 4.0–10.5)
nRBC: 0 % (ref 0.0–0.2)
nRBC: 0 % (ref 0.0–0.2)

## 2023-12-09 LAB — POCT ACTIVATED CLOTTING TIME
Activated Clotting Time: 153 s
Activated Clotting Time: 233 s

## 2023-12-09 LAB — CBG MONITORING, ED
Glucose-Capillary: 84 mg/dL (ref 70–99)
Glucose-Capillary: 106 mg/dL — ABNORMAL HIGH (ref 70–99)
Glucose-Capillary: 86 mg/dL (ref 70–99)

## 2023-12-09 LAB — LACTIC ACID, PLASMA: Lactic Acid, Venous: 1.6 mmol/L (ref 0.5–1.9)

## 2023-12-09 LAB — PHOSPHORUS
Phosphorus: 3.3 mg/dL (ref 2.5–4.6)
Phosphorus: 3.1 mg/dL (ref 2.5–4.6)

## 2023-12-09 LAB — MAGNESIUM
Magnesium: 1.2 mg/dL — ABNORMAL LOW (ref 1.7–2.4)
Magnesium: 1.7 mg/dL (ref 1.7–2.4)

## 2023-12-09 LAB — HEPARIN LEVEL (UNFRACTIONATED): Heparin Unfractionated: 0.47 [IU]/mL (ref 0.30–0.70)

## 2023-12-09 LAB — CK: Total CK: 147 U/L (ref 38–234)

## 2023-12-09 LAB — GLUCOSE, CAPILLARY: Glucose-Capillary: 91 mg/dL (ref 70–99)

## 2023-12-09 SURGERY — ABDOMINAL AORTOGRAM W/LOWER EXTREMITY
Anesthesia: LOCAL | Laterality: Right

## 2023-12-09 SURGERY — PERIPHERAL VASCULAR ULTRASOUND/IVUS

## 2023-12-09 MED ORDER — CLOPIDOGREL BISULFATE 75 MG PO TABS: 75.0000 mg | ORAL_TABLET | Freq: Every day | ORAL | Status: DC

## 2023-12-09 MED ORDER — FENTANYL CITRATE (PF) 100 MCG/2ML IJ SOLN
INTRAMUSCULAR | Status: DC | PRN
  Administered 2023-12-09: 50 ug via INTRAVENOUS

## 2023-12-09 MED ORDER — CHLORHEXIDINE GLUCONATE CLOTH 2 % EX PADS
6.0000 | MEDICATED_PAD | Freq: Every day | CUTANEOUS | Status: DC
  Administered 2023-12-09 – 2023-12-11 (×3): 6 via TOPICAL

## 2023-12-09 MED ORDER — HEPARIN (PORCINE) IN NACL 1000-0.9 UT/500ML-% IV SOLN
INTRAVENOUS | Status: DC | PRN
  Administered 2023-12-09 (×2): 500 mL

## 2023-12-09 MED ORDER — METOPROLOL TARTRATE 5 MG/5ML IV SOLN
5.0000 mg | Freq: Four times a day (QID) | INTRAVENOUS | Status: DC
  Administered 2023-12-09 (×2): 5 mg via INTRAVENOUS
  Filled 2023-12-09 (×2): qty 5

## 2023-12-09 MED ORDER — PANTOPRAZOLE SODIUM 40 MG PO TBEC
40.0000 mg | DELAYED_RELEASE_TABLET | Freq: Every day | ORAL | Status: DC
  Administered 2023-12-09 – 2023-12-11 (×3): 40 mg via ORAL
  Filled 2023-12-09 (×3): qty 1

## 2023-12-09 MED ORDER — HYDROCODONE-ACETAMINOPHEN 5-325 MG PO TABS
1.0000 | ORAL_TABLET | ORAL | Status: DC | PRN
  Administered 2023-12-10: 2 via ORAL
  Administered 2023-12-10: 1 via ORAL
  Filled 2023-12-09: qty 2
  Filled 2023-12-09: qty 1

## 2023-12-09 MED ORDER — IODIXANOL 320 MG/ML IV SOLN
INTRAVENOUS | Status: DC | PRN
  Administered 2023-12-09: 35 mL

## 2023-12-09 MED ORDER — FENTANYL CITRATE (PF) 100 MCG/2ML IJ SOLN
INTRAMUSCULAR | Status: AC
  Filled 2023-12-09: qty 2

## 2023-12-09 MED ORDER — MIDAZOLAM HCL 2 MG/2ML IJ SOLN
INTRAMUSCULAR | Status: AC
  Filled 2023-12-09: qty 2

## 2023-12-09 MED ORDER — SODIUM CHLORIDE 0.9% FLUSH
3.0000 mL | Freq: Two times a day (BID) | INTRAVENOUS | Status: DC
  Administered 2023-12-09 – 2023-12-11 (×4): 3 mL via INTRAVENOUS

## 2023-12-09 MED ORDER — HEPARIN SODIUM (PORCINE) 1000 UNIT/ML IJ SOLN
INTRAMUSCULAR | Status: AC
  Filled 2023-12-09: qty 10

## 2023-12-09 MED ORDER — NITROGLYCERIN 1 MG/10 ML FOR IR/CATH LAB
INTRA_ARTERIAL | Status: DC | PRN
  Administered 2023-12-09: 400 ug via INTRA_ARTERIAL

## 2023-12-09 MED ORDER — LABETALOL HCL 5 MG/ML IV SOLN: 10.0000 mg | INTRAVENOUS | Status: DC | PRN

## 2023-12-09 MED ORDER — ACETAMINOPHEN 650 MG RE SUPP: 650.0000 mg | Freq: Four times a day (QID) | RECTAL | Status: DC | PRN

## 2023-12-09 MED ORDER — ATORVASTATIN CALCIUM 40 MG PO TABS
40.0000 mg | ORAL_TABLET | Freq: Every day | ORAL | Status: DC
  Administered 2023-12-09 – 2023-12-10 (×2): 40 mg via ORAL
  Filled 2023-12-09 (×2): qty 1

## 2023-12-09 MED ORDER — HYDRALAZINE HCL 20 MG/ML IJ SOLN: 5.0000 mg | INTRAMUSCULAR | Status: DC | PRN

## 2023-12-09 MED ORDER — SODIUM CHLORIDE 0.9% FLUSH: 3.0000 mL | INTRAVENOUS | Status: DC | PRN

## 2023-12-09 MED ORDER — MIDAZOLAM HCL 2 MG/2ML IJ SOLN
INTRAMUSCULAR | Status: DC | PRN
  Administered 2023-12-09: 1 mg via INTRAVENOUS

## 2023-12-09 MED ORDER — ACETAMINOPHEN 325 MG PO TABS: 650.0000 mg | ORAL_TABLET | Freq: Four times a day (QID) | ORAL | Status: DC | PRN

## 2023-12-09 MED ORDER — IODIXANOL 320 MG/ML IV SOLN
INTRAVENOUS | Status: DC | PRN
  Administered 2023-12-09: 110 mL

## 2023-12-09 MED ORDER — FENTANYL CITRATE (PF) 100 MCG/2ML IJ SOLN
INTRAMUSCULAR | Status: DC | PRN
  Administered 2023-12-09: 25 ug via INTRAVENOUS

## 2023-12-09 MED ORDER — DIPHENHYDRAMINE HCL 50 MG/ML IJ SOLN
INTRAMUSCULAR | Status: AC
  Filled 2023-12-09: qty 1

## 2023-12-09 MED ORDER — LIDOCAINE HCL (PF) 1 % IJ SOLN
INTRAMUSCULAR | Status: DC | PRN
  Administered 2023-12-09: 10 mL

## 2023-12-09 MED ORDER — LIDOCAINE HCL (PF) 1 % IJ SOLN
INTRAMUSCULAR | Status: AC
  Filled 2023-12-09: qty 30

## 2023-12-09 MED ORDER — DIPHENHYDRAMINE HCL 50 MG/ML IJ SOLN
25.0000 mg | Freq: Once | INTRAMUSCULAR | Status: AC
  Administered 2023-12-09: 25 mg via INTRAVENOUS

## 2023-12-09 MED ORDER — SODIUM CHLORIDE 0.9 % IV SOLN: 250.0000 mL | INTRAVENOUS | Status: AC | PRN

## 2023-12-09 MED ORDER — MIDAZOLAM HCL 2 MG/2ML IJ SOLN
1.0000 mg | INTRAMUSCULAR | Status: DC | PRN
  Administered 2023-12-09: 1 mg via INTRAVENOUS
  Filled 2023-12-09: qty 2

## 2023-12-09 MED ORDER — NITROGLYCERIN 1 MG/10 ML FOR IR/CATH LAB
INTRA_ARTERIAL | Status: AC
  Filled 2023-12-09: qty 10

## 2023-12-09 MED ORDER — ASPIRIN 81 MG PO TBEC
81.0000 mg | DELAYED_RELEASE_TABLET | Freq: Every day | ORAL | Status: DC
  Administered 2023-12-10 – 2023-12-11 (×2): 81 mg via ORAL
  Filled 2023-12-09 (×2): qty 1

## 2023-12-09 MED ORDER — MAGNESIUM SULFATE 2 GM/50ML IV SOLN
2.0000 g | Freq: Once | INTRAVENOUS | Status: AC
  Administered 2023-12-09: 2 g via INTRAVENOUS
  Filled 2023-12-09: qty 50

## 2023-12-09 MED ORDER — ASPIRIN 81 MG PO TBEC
81.0000 mg | DELAYED_RELEASE_TABLET | Freq: Every day | ORAL | Status: DC
  Administered 2023-12-09: 81 mg via ORAL
  Filled 2023-12-09: qty 1

## 2023-12-09 MED ORDER — SODIUM CHLORIDE 0.9% FLUSH
3.0000 mL | Freq: Two times a day (BID) | INTRAVENOUS | Status: DC
  Administered 2023-12-10 – 2023-12-11 (×4): 3 mL via INTRAVENOUS

## 2023-12-09 MED ORDER — SODIUM CHLORIDE 0.9 % IV SOLN
1.0000 mg/h | INTRAVENOUS | Status: DC
  Administered 2023-12-09: 1 mg/h
  Filled 2023-12-09: qty 24

## 2023-12-09 MED ORDER — MORPHINE SULFATE (PF) 2 MG/ML IV SOLN
5.0000 mg | INTRAVENOUS | Status: DC | PRN
  Administered 2023-12-09 (×2): 5 mg via INTRAVENOUS
  Filled 2023-12-09 (×2): qty 3

## 2023-12-09 MED ORDER — SODIUM CHLORIDE 0.9 % WEIGHT BASED INFUSION
1.0000 mL/kg/h | INTRAVENOUS | Status: AC
  Administered 2023-12-09: 1 mL/kg/h via INTRAVENOUS

## 2023-12-09 MED ORDER — SODIUM CHLORIDE 0.9 % IV SOLN: INTRAVENOUS | Status: AC

## 2023-12-09 MED ORDER — HEPARIN SODIUM (PORCINE) 1000 UNIT/ML IJ SOLN
INTRAMUSCULAR | Status: DC | PRN
  Administered 2023-12-09: 2000 [IU] via INTRAVENOUS
  Administered 2023-12-09: 5000 [IU] via INTRAVENOUS

## 2023-12-09 MED ORDER — HEPARIN (PORCINE) 25000 UT/250ML-% IV SOLN
800.0000 [IU]/h | INTRAVENOUS | Status: DC
  Filled 2023-12-09: qty 250

## 2023-12-09 MED ORDER — METOPROLOL TARTRATE 5 MG/5ML IV SOLN: 2.0000 mg | INTRAVENOUS | Status: DC | PRN

## 2023-12-09 MED ORDER — HEPARIN SODIUM (PORCINE) 1000 UNIT/ML IJ SOLN
INTRAMUSCULAR | Status: DC | PRN
  Administered 2023-12-09: 2000 [IU] via INTRAVENOUS
  Administered 2023-12-09: 3000 [IU] via INTRAVENOUS

## 2023-12-09 MED ORDER — CLOPIDOGREL BISULFATE 75 MG PO TABS
75.0000 mg | ORAL_TABLET | Freq: Every day | ORAL | Status: DC
Start: 2023-12-09 — End: 2023-12-11
  Administered 2023-12-09 – 2023-12-11 (×3): 75 mg via ORAL
  Filled 2023-12-09 (×3): qty 1

## 2023-12-09 SURGICAL SUPPLY — 18 items
BALLOON STERLING OTW 3X100X150 (BALLOONS) IMPLANT
BALLOON STERLING OTW 5X220X150 (BALLOONS) IMPLANT
BALLOON STRLNG SL OTW 2X80X150 (BALLOONS) IMPLANT
CANISTER PENUMBRA ENGINE (MISCELLANEOUS) IMPLANT
CATH LIGHTNING 7 XTORQ 130 (CATHETERS) IMPLANT
CATH QUICKCROSS SUPP .035X90CM (MICROCATHETER) IMPLANT
COVER DOME SNAP 22 D (MISCELLANEOUS) IMPLANT
DEVICE VASC CLSR CELT ART 7 (Vascular Products) IMPLANT
GLIDEWIRE ADV .035X260CM (WIRE) IMPLANT
KIT ENCORE 26 ADVANTAGE (KITS) IMPLANT
PACK CARDIAC CATHETERIZATION (CUSTOM PROCEDURE TRAY) IMPLANT
SET ATX-X65L (MISCELLANEOUS) IMPLANT
SHEATH CATAPULT 7FR 45 (SHEATH) IMPLANT
SHEATH PINNACLE 7F 10CM (SHEATH) IMPLANT
SHEATH PROBE COVER 6X72 (BAG) IMPLANT
STENT VIABAHN 6X100X120 (Permanent Stent) IMPLANT
STENT VIABAHN 6X250X120 (Permanent Stent) IMPLANT
WIRE G V18X300CM (WIRE) IMPLANT

## 2023-12-09 SURGICAL SUPPLY — 14 items
CATH EKOS ULTRASOUND 135X40 (CATHETERS) IMPLANT
CATH OMNI FLUSH 5F 65CM (CATHETERS) IMPLANT
CATH QUICKCROSS SUPP .035X90CM (MICROCATHETER) IMPLANT
COVER DOME SNAP 22 D (MISCELLANEOUS) IMPLANT
GLIDEWIRE ADV .035X260CM (WIRE) IMPLANT
KIT MICROPUNCTURE NIT STIFF (SHEATH) IMPLANT
KIT SINGLE USE MANIFOLD (KITS) IMPLANT
KIT SYRINGE INJ CVI SPIKEX1 (MISCELLANEOUS) IMPLANT
PACK CARDIAC CATHETERIZATION (CUSTOM PROCEDURE TRAY) IMPLANT
SET ATX-X65L (MISCELLANEOUS) IMPLANT
SHEATH CATAPULT 6FR 45 (SHEATH) IMPLANT
SHEATH PINNACLE 5F 10CM (SHEATH) IMPLANT
SHEATH PROBE COVER 6X72 (BAG) IMPLANT
WIRE BENTSON .035X145CM (WIRE) IMPLANT

## 2023-12-09 NOTE — Progress Notes (Addendum)
 Pt reported difficulty breathing and patient looking flush with labored breathing. Non-rebreather mask placed and 25 mg Benadryl  given per MD order.

## 2023-12-09 NOTE — Op Note (Signed)
 Patient name: Crystal Patterson MRN: [id number] DOB: 12-Apr-1965 Sex: female  12/09/2023 Pre-operative Diagnosis: Follow up lysis Post-operative diagnosis:  Same Surgeon:  Malvina New Procedure Performed:  1.  Follow-up lysis study  2.  Right leg angiogram  3.  Mechanical thrombectomy right superficial femoral-popliteal artery  4.  Stent, right superficial femoral and popliteal artery (6 mm Viabahn)  5.  Angioplasty right tibioperoneal trunk and posterior tibial artery  6.  Conscious sedation, 57 minutes  7.  Closure device, Celt   Indications: This is a 58 year old female with [place] 1A right leg ischemia who was found to have an occluded right SFA stent.  She had a lysis catheter placed earlier today catheter directed lytic therapy for 6 hours.  She is back for repeat study  Procedure:  The patient was identified in the holding area and taken to room 8.  The patient was then placed supine on the table and prepped and draped in the usual sterile fashion.  A time out was called.  Conscious sedation was administered with the use of IV fentanyl  and Versed  under continuous physician and nurse monitoring.  Heart rate, blood pressure, and oxygen  saturation were continuously monitored.  Total sedation time was 57 minutes.  A Glidewire advantage was placed through the lysis catheter which was removed.  Contrast injections were performed through the sheath which showed the stent to be now patent.  There was residual thrombus within the superficial femoral-popliteal artery.  The dominant runoff was through the anterior tibial however there was poor opacification of the vessels at all of the foot.  I then upsized the 6 French sheath to a 7 [place] sheath.  The patient was fully heparinized.  Using a penumbra CAT 7 device mechanical thrombectomy was performed of the superficial femoral-popliteal artery.  2 passes down the back were performed.  Repeat imaging showed evacuation of some of the residual  thrombus.  There was a lesion just proximal to the joint space that appeared calcified.  At this point I elected to stent the lesion and the existing SFA stent.  I placed a 6-50 followed by 6 100 Viabahn stents which were postdilated with a 5 mm balloon.  Follow-up imaging showed inline flow through the superficial femoral and popliteal artery stents down to the knee without any residual thrombus or stenosis.  I then performed additional imaging of the runoff vessels.  The anterior tibial was patent down across the ankle.  The posterior tibial which was patent a year ago had a moderate segment occlusion.  Using a V-18 wire and a 3 x 100 Sterling balloon I was able to cross the occlusion in the posterior tibial artery and diseased tibioperoneal trunk.  I performed balloon angioplasty with 3 mm balloon.  Follow-up imaging showed a patent posterior tibial artery however just distal to where I had treated there was a residual stenosis.  I inserted a 2 x 100 balloon and repeated balloon angioplasty at this level.  On subsequent imaging there was spasm within the vessel and I did not have inline flow through the foot and so I repeated angioplasty with the 2 mm balloon down across the ankle.  I also felt that there was some spasm here and so I injected 600 mcg of nitroglycerin  into the posterior tibial artery.  I then performed completion imaging which showed two-vessel runoff through the anterior tibial and peroneal artery.  I was satisfied with these results.  I exchanged the 7 [place] sheath out for  short 7 I closed the groin with a Celt   Impression:  #1  Patent SFA stent after thrombolytic therapy however there was residual thrombus that was evacuated with a penumbra CAT 7 device.  #2  Covered stenting from the joint space up into the proximal SFA using overlapping 6 mm Viabahn stents  #3  Recanalization and subsequent angioplasty of an occluded posterior tibial artery using a 2 mm balloon down by the ankle and  a 3 mm balloon proximally  #4  The patient now has inline flow through the posterior tibial and anterior tibial artery and patent superficial femoral and popliteal artery stents     V. Malvina New, M.D., [facility] [place] Office: [phone] Pager:  [phone]

## 2023-12-09 NOTE — ED Notes (Signed)
 RN unable to obtain labs, phlebotomy consulted

## 2023-12-09 NOTE — Progress Notes (Signed)
 PHARMACY - ANTICOAGULATION CONSULT NOTE  Pharmacy Consult for heparin  Indication: VTE treatment  No Known Allergies  Patient Measurements: Height: 5' 4 (162.6 cm) Weight: 55.3 kg (122 lb) IBW/kg (Calculated) : 54.7 HEPARIN  DW (KG): 55.3  Vital Signs: Temp: 98.5 F (36.9 C) (10/10 0526) Temp Source: Oral (10/10 0526) BP: 112/88 (10/10 0700) Pulse Rate: 62 (10/10 0545)  Labs: Recent Labs    12/08/23 2129 12/08/23 2138 12/08/23 2138 12/08/23 2233 12/09/23 0634 12/09/23 0635  HGB  --  11.6*   < > 14.2 10.8*  --   HCT  --  34.0*  --  45.4 34.2*  --   PLT  --   --   --  146* 143*  --   LABPROT 15.3*  --   --   --   --   --   INR 1.1  --   --   --   --   --   HEPARINUNFRC  --   --   --   --   --  0.47  CREATININE 0.60 0.60  --   --  0.68  --   CKTOTAL 147  --   --   --   --   --    < > = values in this interval not displayed.    Estimated Creatinine Clearance: 67 mL/min (by C-G formula based on SCr of 0.68 mg/dL).   Medical History: 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   Lupus (HCC)    Myocardial infarction [facility])    Peripheral vascular disease      Assessment: 38 YOF presenting as transfer from [facility] with concern for limb ischemia, she is not on anticoagulation PTA.  Heparin  level therapeutic at 0.47. No issues with bleeding or with infusion per RN. HgB 10.8 and PLTs 143.   Goal of Therapy:  Heparin  level 0.3-0.7 units/ml Monitor platelets by anticoagulation protocol: Yes   Plan:  Continue heparin  infusion at 900 units/hr.  F/u 6 hour  heparin  level F/u long term AC plan, patient going to cath lab this AM for angiogram of leg.   Powell Blush, PharmD, [facility]  Clinical Pharmacist ED Pharmacist Phone # [phone] 12/09/2023 7:27 AM

## 2023-12-09 NOTE — Op Note (Signed)
 Patient name: Crystal Patterson MRN: [id number] DOB: October 14, 1965 Sex: female  12/08/2023 - 12/09/2023 Pre-operative Diagnosis: Right lower extremity acute limb ischemia-[place] 1A Post-operative diagnosis:  Same Surgeon:  Fonda FORBES Rim, MD Procedure Performed: 1.  Ultrasound-guided micropuncture access of the left common femoral artery in retrograde fashion 2.  Aortogram 3.  Second-order cannulation, right lower extremity angiogram Selective angiography of the popliteal artery 4.  Initiation of 40 cm EKOS pharmacomechanical thrombolysis extending from the popliteal artery through the superficial femoral artery 5.  Moderate sedation time 25 minutes, contrast volume 35 mL    Indications: Patient is a 57 year old female presenting with a 3-day history of right lower extremity acute limb ischemia with rest pain.  Prior history of right sided superficial femoral artery stents.  Unfortunately has missed follow-up with our office for over a year.  She has been immobile for several days, stating she has been sick.   After discussing risks and benefits of right lower extremity diagnostic angiography in effort to define improve distal perfusion, Lashonda elected to proceed.  She is aware that this will likely result in left-sided lysis catheter in effort to recanalize the previously placed stent.  Findings:  Aortogram: Patent celiac artery, superior mesenteric artery, bilateral renal arteries.  No flow-limiting stenosis in the aortoiliac segments bilaterally.  On the right: Widely patent common femoral artery, profunda.  The superficial femoral artery is occluded proximally, with reconstitution at the P2 segment of the popliteal artery.  Distally, there is three-vessel runoff to the foot.  Tibial vessels are atretic, and calcific.  Severe small vessel disease in the foot.   Procedure:  The patient was identified in the holding area and taken to room 8.  The patient was then placed supine on the table  and prepped and draped in the usual sterile fashion.  A time out was called.  Ultrasound was used to evaluate the left common femoral artery.  It was patent .  A digital ultrasound image was acquired.  A micropuncture needle was used to access the left common femoral artery under ultrasound guidance.  An 018 wire was advanced without resistance and a micropuncture sheath was placed.  The 018 wire was removed and a benson wire was placed.  The micropuncture sheath was exchanged for a 5 french sheath.  An omniflush catheter was advanced over the wire to the level of L-1.  An abdominal angiogram was obtained.  Next, using the omniflush catheter and a benson wire, the aortic bifurcation was crossed and the catheter was placed into theright external iliac artery and right runoff was obtained.  See results above.  I elected to attempt to cross the superficial femoral artery occlusion, previously placed stent, popliteal artery for lytic catheter placement.  The patient was heparinized and a 6 x 45 cm sheath was brought onto the field and parked in the right common femoral artery.  Next, using a series of wires and catheters, the superficial femoral artery was cannulated, and the wire driven through the superficial femoral artery, popliteal artery into the tibioperoneal trunk.  This was followed by 90 cm catheter to ensure that was true lumen.  Selective angiography of the popliteal artery followed.  Next, a 40 cm EKOS catheter was brought onto the field and parked in the P2 segment of the popliteal artery.  The pharmacomechanical thrombolysis catheter was then placed, and lysis initiated at 1 mg/h of tPA.   Patient tolerated the procedure well. Plan for return to Cath Lab later  today versus tomorrow.   Fonda FORBES Rim MD [facility] [place] Office: [phone]

## 2023-12-09 NOTE — Telephone Encounter (Signed)
 Patient Product/process development scientist completed.    The patient is insured through [facility]. Patient has Medicare and is not eligible for a copay card, but may be able to apply for patient assistance or Medicare RX Payment Plan (Patient Must reach out to their plan, if eligible for payment plan), if available.    Ran test claim for Eliquis 5 mg and the current 30 day co-pay is $0.00.  Ran test claim for Xarelto  20 mg and the current 30 day co-pay is $0.00.  This test claim was processed through [facility]- copay amounts may vary at other pharmacies due to pharmacy/plan contracts, or as the patient moves through the different stages of their insurance plan.     Reyes Sharps, CPHT Pharmacy Technician Patient Advocate Specialist Lead [facility] Patient Advocate Team Direct Number: [phone]  Fax: [phone]

## 2023-12-09 NOTE — Plan of Care (Signed)

## 2023-12-09 NOTE — H&P (Signed)
    Subjective  -   Status postplacement of lytics catheter earlier today.  Still complaining of   Physical Exam:  No significant hematoma at access site Minimal pedal Doppler signals       Assessment/Plan:    Plan for lytics follow-up and possible mechanical thrombectomy and stenting.  Discussed with patient's daughter.  Crystal Patterson 12/09/2023 2:58 PM --  Vitals:   12/09/23 1315 12/09/23 1458  BP: (!) 143/103   Pulse: 72   Resp: 18   Temp:    SpO2: 100% 98%    Intake/Output Summary (Last 24 hours) at 12/09/2023 1458 Last data filed at 12/09/2023 1300 Gross per 24 hour  Intake 905.99 ml  Output --  Net 905.99 ml     Laboratory CBC    Component Value Date/Time   WBC 6.4 12/09/2023 0634   HGB 10.8 (L) 12/09/2023 0634   HCT 34.2 (L) 12/09/2023 0634   PLT 143 (L) 12/09/2023 0634    BMET    Component Value Date/Time   NA 140 12/09/2023 0634   K 3.9 12/09/2023 0634   CL 104 12/09/2023 0634   CO2 27 12/09/2023 0634   GLUCOSE 92 12/09/2023 0634   BUN <5 (L) 12/09/2023 0634   CREATININE 0.68 12/09/2023 0634   CALCIUM  8.2 (L) 12/09/2023 0634   GFRNONAA >60 12/09/2023 0634   GFRAA >60 12/07/2016 0012    COAG Lab Results  Component Value Date   INR 1.1 12/08/2023   INR 0.98 12/06/2016   INR 1.15 05/25/2016   No results found for: PTT  Antibiotics Anti-infectives (From admission, onward)    None        V. Malvina Serene CLORE, M.D., [facility] [place] Office: [phone] Pager:  [phone]

## 2023-12-09 NOTE — Progress Notes (Signed)
  Daily Progress Note   Subjective: Pain in the right leg.  Sensorimotor intact  Objective: Vitals:   12/09/23 0700 12/09/23 0849  BP: 112/88   Pulse:    Resp: 14   Temp:    SpO2:  100%    Physical Examination Pain in the right leg, sensorimotor intact Weak, venous, anterior tubular signal Nonlabored breathing Regular rate AKA on the left Appears failure to thrive  ASSESSMENT/PLAN:  Patient is a 58 year old female presenting with a 3-day history of right lower extremity acute limb ischemia with rest pain.  Prior history of right sided superficial femoral artery stents.  Unfortunately has missed follow-up with our office for over a year.  She has been immobile for several days, stating she has been sick.  After discussing risks and benefits of right lower extremity diagnostic angiography in effort to define improve distal perfusion, Leontine elected to proceed.  She is aware that this will likely result in left-sided lysis catheter in effort to recanalize the previously placed stent.   Fonda FORBES Rim MD MS [facility] [phone] 12/09/2023  8:51 AM

## 2023-12-09 NOTE — Progress Notes (Signed)
 PROGRESS NOTE    Crystal Patterson  FMW:[id number] DOB: 1965/11/18 DOA: 12/08/2023 PCP: Carlette Benita Area, MD   Brief Narrative:    58 y.o. female with medical history significant of HTN , PAD, left AKA, CKD, scleroderma, SLE, GERD, gout, presented with right foot pain with a known history of peripheral arterial disease has a drug-eluting stent of SFA with balloon angioplasty of PTA done in May 2024 for critical limb ischemia, she has already had left AKA done in 2022. Started on heparin  drip and angiogram done on 10/10.  Assessment & Plan:  Principal Problem:   Critical limb ischemia of right lower extremity (HCC) Active Problems:   HYPOKALEMIA   Essential hypertension   Asthma   PAD (peripheral artery disease)   CAD (coronary artery disease)   History of lupus nephritis   Tobacco use disorder   Marijuana use   Prolonged QT interval   Hypoglycemia   Acute urinary retention   58 y.o. female with medical history significant of HTN , PAD, left AKA, CKD, scleroderma, SLE, GERD, gout  Admitted for critical limb ischemia of right lower extremity   Critical limb ischemia of right lower extremity,POA: Continue Heparin  drip On alteptase drip as well, started on 10/10 Vascular surgery on board  S/p Ultrasound-guided micropuncture access of the left common femoral artery in retrograde fashion , Aortogram ,Second-order cannulation, right lower extremity angiogram and Initiation of 40 cm EKOS pharmacomechanical thrombolysis extending from the popliteal artery through the superficial femoral artery done on 10/10 by Dr Lanis.     Essential hypertension Monitor closely   History of lupus nephritis Chronic stable   Hypokalemia:prn repletion   PAD (peripheral artery disease) Appreciate vascular surgery consult   Asthma Chronic stable, continue home meds   CAD  Hold beta-blocker given bradycardia and aspirin  as patient is on full dose of heparin  for right now   Marijuana  use Ongoing use   Tobacco use disorder in remission   Hypoglycemia Monitor blood sugars closely   Acute urinary retention Patient states for the past 2 weeks she has an ongoing Foley catheter.   Once more stable could try to liberate  Disposition: Lives at home by herself  DVT prophylaxis: On heparin  drip     Code Status: Full Code Family Communication:  None at the bedside Status is: Inpatient Remains inpatient appropriate because: critical limb ischemia    Subjective:  Admitted with critical RLE ischemia and vascular surgery has been consulted. She is on a heparin  drip. Complaining of severe RLE pain.  Examination:  General exam: Appears to be in moderate distress Respiratory system: Clear to auscultation. Respiratory effort normal. Cardiovascular system: S1 & S2 heard, RRR. No JVD, murmurs, rubs, gallops or clicks. No pedal edema. Absent RLE distal pulses Gastrointestinal system: Abdomen is nondistended, soft and nontender. No organomegaly or masses felt. Normal bowel sounds heard. Central nervous system: Alert and oriented. No focal neurological deficits. Extremities: Absent RLE distal pulses, right foot is cold Skin: No rashes, lesions or ulcers Psychiatry: Judgement and insight appear normal. Mood & affect appropriate.     Diet Orders (From admission, onward)     Start     Ordered   12/09/23 0446  Diet NPO time specified  Diet effective midnight        12/09/23 0445            Objective: Vitals:   12/09/23 0526 12/09/23 0545 12/09/23 0700 12/09/23 0849  BP:  109/86 112/88   Pulse:  62    Resp:  18 14   Temp: 98.5 F (36.9 C)     TempSrc: Oral     SpO2:  100%  100%  Weight:      Height:       No intake or output data in the 24 hours ending 12/09/23 1007 Filed Weights   12/08/23 2104  Weight: 55.3 kg    Scheduled Meds:  diphenhydrAMINE        atorvastatin   40 mg Oral q1800   pantoprazole   40 mg Oral Daily   Continuous Infusions:   sodium chloride  75 mL/hr at 12/09/23 0929   sodium chloride  35 mL/hr at 12/09/23 0914   alteplase  (LIMB ISCHEMIA) 24 mg in normal saline (0.048 mg/mL) infusion 1 mg/hr (12/09/23 0945)   heparin  800 Units/hr (12/09/23 0947)    Nutritional status     Body mass index is 20.94 kg/m.  Data Reviewed:   CBC: Recent Labs  Lab 12/08/23 2138 12/08/23 2233 12/09/23 0634  WBC  --  6.7 6.4  NEUTROABS  --  3.3  --   HGB 11.6* 14.2 10.8*  HCT 34.0* 45.4 34.2*  MCV  --  94.4 95.0  PLT  --  146* 143*   Basic Metabolic Panel: Recent Labs  Lab 12/08/23 2129 12/08/23 2138 12/09/23 0634  NA 136 139 140  K 2.7* 2.7* 3.9  CL 100 100 104  CO2 25  --  27  GLUCOSE 76 75 92  BUN <5* <3* <5*  CREATININE 0.60 0.60 0.68  CALCIUM  8.1*  --  8.2*  MG 1.2*  --  1.7  PHOS 3.3  --  3.1   GFR: Estimated Creatinine Clearance: 67 mL/min (by C-G formula based on SCr of 0.68 mg/dL). Liver Function Tests: Recent Labs  Lab 12/08/23 2129 12/09/23 0634  AST 23 20  ALT 12 12  ALKPHOS 70 68  BILITOT 1.1 0.8  PROT 6.6 6.2*  ALBUMIN  2.8* 2.6*   No results for input(s): LIPASE, AMYLASE in the last 168 hours. No results for input(s): AMMONIA in the last 168 hours. Coagulation Profile: Recent Labs  Lab 12/08/23 2129  INR 1.1   Cardiac Enzymes: Recent Labs  Lab 12/08/23 2129  CKTOTAL 147   BNP (last 3 results) No results for input(s): PROBNP in the last 8760 hours. HbA1C: No results for input(s): HGBA1C in the last 72 hours. CBG: Recent Labs  Lab 12/08/23 2133 12/08/23 2236 12/09/23 0122 12/09/23 0247 12/09/23 0539  GLUCAP 71 93 84 106* 86   Lipid Profile: No results for input(s): CHOL, HDL, LDLCALC, TRIG, CHOLHDL, LDLDIRECT in the last 72 hours. Thyroid  Function Tests: Recent Labs    12/08/23 2129  TSH 5.443*   Anemia Panel: No results for input(s): VITAMINB12, FOLATE, FERRITIN, TIBC, IRON, RETICCTPCT in the last 72 hours. Sepsis  Labs: Recent Labs  Lab 12/08/23 2233 12/09/23 0527  LATICACIDVEN 2.1* 1.6    No results found for this or any previous visit (from the past 240 hours).       Radiology Studies: No results found.         LOS: 1 day   Time spent= 39 mins    Deliliah Room, MD [facility]  If 7PM-7AM, please contact night-coverage  12/09/2023, 10:07 AM

## 2023-12-09 NOTE — Progress Notes (Signed)
 PHARMACY - ANTICOAGULATION CONSULT NOTE  Pharmacy Consult for heparin  Indication: VTE treatment  No Known Allergies  Patient Measurements: Height: 5' 4 (162.6 cm) Weight: 55.3 kg (122 lb) IBW/kg (Calculated) : 54.7 HEPARIN  DW (KG): 55.3  Vital Signs: Temp: 98.5 F (36.9 C) (10/10 0526) Temp Source: Oral (10/10 0526) BP: 112/88 (10/10 0700) Pulse Rate: 62 (10/10 0545)  Labs: Recent Labs    12/08/23 2129 12/08/23 2138 12/08/23 2138 12/08/23 2233 12/09/23 0634 12/09/23 0635  HGB  --  11.6*   < > 14.2 10.8*  --   HCT  --  34.0*  --  45.4 34.2*  --   PLT  --   --   --  146* 143*  --   LABPROT 15.3*  --   --   --   --   --   INR 1.1  --   --   --   --   --   HEPARINUNFRC  --   --   --   --   --  0.47  CREATININE 0.60 0.60  --   --  0.68  --   CKTOTAL 147  --   --   --   --   --    < > = values in this interval not displayed.    Estimated Creatinine Clearance: 67 mL/min (by C-G formula based on SCr of 0.68 mg/dL).   Medical History: 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   Lupus (HCC)    Myocardial infarction [facility])    Peripheral vascular disease      Assessment: 30 YOF presenting as transfer from [facility] with concern for limb ischemia, she is not on anticoagulation PTA.  Heparin  level therapeutic this morning on 900 units/hr.  Patient now returns from OR with heparin  running in sheath at 800 units/hr.  Per vascular okay to increase to goal heparin  level 0.2-0.5  Goal of Therapy:  Heparin  level 0.2-0.5 Monitor platelets by anticoagulation  protocol: Yes   Plan:  Continue heparin  infusion at 800 units/hr.  F/u 6 hour heparin  level F/u long term anticoagulation plan, Eliquis and Xarelto copays both $0.  Harlene Barlow, Berdine BIRCH, BCPS, [facility] Clinical Pharmacist  12/09/2023 10:32 AM   [facility] pharmacy phone numbers are listed on [email or web address]

## 2023-12-09 NOTE — ED Notes (Signed)
 This NT attempted labs on Pt with no success. Pt refused second attempt. RN aware. Tyrone called for attempt.

## 2023-12-10 DIAGNOSIS — I70221 Atherosclerosis of native arteries of extremities with rest pain, right leg: Secondary | ICD-10-CM | POA: Diagnosis not present

## 2023-12-10 DIAGNOSIS — Z95820 Peripheral vascular angioplasty status with implants and grafts: Secondary | ICD-10-CM

## 2023-12-10 LAB — CBC
HCT: 29 % — ABNORMAL LOW (ref 36.0–46.0)
Hemoglobin: 9.2 g/dL — ABNORMAL LOW (ref 12.0–15.0)
MCH: 30.1 pg (ref 26.0–34.0)
MCHC: 31.7 g/dL (ref 30.0–36.0)
MCV: 94.8 fL (ref 80.0–100.0)
Platelets: 118 K/uL — ABNORMAL LOW (ref 150–400)
RBC: 3.06 MIL/uL — ABNORMAL LOW (ref 3.87–5.11)
RDW: 14.1 % (ref 11.5–15.5)
WBC: 8.1 K/uL (ref 4.0–10.5)
nRBC: 0 % (ref 0.0–0.2)

## 2023-12-10 LAB — BASIC METABOLIC PANEL WITH GFR
Anion gap: 8 (ref 5–15)
BUN: <5 mg/dL — ABNORMAL LOW (ref 6–20)
CO2: 24 mmol/L (ref 22–32)
Calcium: 7.7 mg/dL — ABNORMAL LOW (ref 8.9–10.3)
Chloride: 104 mmol/L (ref 98–111)
Creatinine, Ser: 0.58 mg/dL (ref 0.44–1.00)
GFR, Estimated: >60 mL/min (ref >60)
Glucose, Bld: 82 mg/dL (ref 70–99)
Potassium: 3.7 mmol/L (ref 3.5–5.1)
Sodium: 136 mmol/L (ref 135–145)

## 2023-12-10 LAB — LIPID PANEL
Cholesterol: 83 mg/dL (ref 0–200)
HDL: 38 mg/dL — ABNORMAL LOW (ref >40)
LDL Cholesterol: 35 mg/dL (ref 0–99)
Total CHOL/HDL Ratio: 2.2 ratio
Triglycerides: 51 mg/dL (ref <150)
VLDL: 10 mg/dL (ref 0–40)

## 2023-12-10 MED ORDER — APIXABAN 5 MG PO TABS
5.0000 mg | ORAL_TABLET | Freq: Two times a day (BID) | ORAL | Status: DC
  Administered 2023-12-10 – 2023-12-11 (×3): 5 mg via ORAL
  Filled 2023-12-10 (×3): qty 1

## 2023-12-10 NOTE — Progress Notes (Signed)
 PROGRESS NOTE    Crystal Patterson  FMW:[id number] DOB: [birth year] DOA: 12/08/2023 PCP: Carlette Benita Area, MD   Brief Narrative:    58 y.o. female with medical history significant of HTN , PAD, left AKA, CKD, scleroderma, SLE, GERD, gout, presented with right foot pain with a known history of peripheral arterial disease has a drug-eluting stent of SFA with balloon angioplasty of PTA done in May 2024 for critical limb ischemia, she has already had left AKA done in 2022. Started on heparin  drip and angiogram done on 10/10. Off of heparin  drip now.   Assessment & Plan:  Principal Problem:   Critical limb ischemia of right lower extremity (HCC) Active Problems:   HYPOKALEMIA   Essential hypertension   Asthma   PAD (peripheral artery disease)   CAD (coronary artery disease)   History of lupus nephritis   Tobacco use disorder   Marijuana use   Prolonged QT interval   Hypoglycemia   Acute urinary retention   58 y.o. female with medical history significant of HTN , PAD, left AKA, CKD, scleroderma, SLE, GERD, gout  Admitted for critical limb ischemia of right lower extremity   Off of Heparin  drip On alteptase drip as well, started on 10/10, dced later. Vascular surgery on board  S/p Ultrasound-guided micropuncture access of the left common femoral artery in retrograde fashion , Aortogram ,Second-order cannulation, right lower extremity angiogram and Initiation of 40 cm EKOS pharmacomechanical thrombolysis extending from the popliteal artery through the superficial femoral artery done on 10/10 by Dr Lanis.  Patent SFA stent after thrombolytic therapy however there was residual thrombus that was evacuated with a penumbra CAT 7 device.  S/p Covered stenting from the joint space up into the proximal SFA using overlapping 6 mm Viabahn stents, Recanalization and subsequent angioplasty of an occluded posterior tibial artery using a 2 mm balloon down by the ankle and a 3 mm balloon proximally,  The patient now has inline flow through the posterior tibial and anterior tibial artery and patent superficial femoral and popliteal artery stents.      Essential hypertension Monitor closely   History of lupus nephritis Chronic stable   Hypokalemia:prn repletion   PAD  Appreciate vascular surgery consult On aspirin  and plavix    Asthma Chronic stable, continue home meds   CAD  Hold beta-blocker given bradycardia and aspirin  as patient is on full dose of heparin  for right now   Marijuana use Ongoing use   Tobacco use disorder in remission   Hypoglycemia Monitor blood sugars closely   Acute urinary retention Patient states for the past 2 weeks she has an ongoing Foley catheter.   Once more stable could try to liberate  Disposition: Lives at home by herself  DVT prophylaxis: Dced heparin  drip. On DAPT     Code Status: Full Code Family Communication:  None at the bedside Status is: Inpatient Remains inpatient appropriate because: critical limb ischemia  Subjective:  Feels much better this morning, Pain is much better. We spoke about her discharge planning. She will talk to her sister and see if she can go to her place on discharge.  Examination:  General exam: NAD, calm Respiratory system: Clear to auscultation. Respiratory effort normal. Cardiovascular system: S1 & S2 heard, RRR. No JVD, murmurs, rubs, gallops or clicks. No pedal edema. Absent RLE distal pulses Gastrointestinal system: Abdomen is nondistended, soft and nontender. No organomegaly or masses felt. Normal bowel sounds heard. Central nervous system: Alert and oriented. No focal neurological deficits.  Extremities: Right foot is warm today Skin: No rashes, lesions or ulcers Psychiatry: Judgement and insight appear normal. Mood & affect appropriate.     Diet Orders (From admission, onward)     Start     Ordered   12/09/23 1717  Diet regular Room service appropriate? Yes; Fluid consistency: Thin   Diet effective now       Question Answer Comment  Room service appropriate? Yes   Fluid consistency: Thin      12/09/23 1716            Objective: Vitals:   12/10/23 0830 12/10/23 0842 12/10/23 0900 12/10/23 0920  BP: 125/88  104/77   Pulse: 69 63 66 61  Resp: (!) 24 10 12 19   Temp:      TempSrc:      SpO2: 97% 97% 97% 100%  Weight:      Height:        Intake/Output Summary (Last 24 hours) at 12/10/2023 0926 Last data filed at 12/10/2023 0800 Gross per 24 hour  Intake 2229.35 ml  Output 1250 ml  Net 979.35 ml   Filed Weights   12/08/23 2104  Weight: 55.3 kg    Scheduled Meds:  aspirin  EC  81 mg Oral Daily   atorvastatin   40 mg Oral q1800   Chlorhexidine  Gluconate Cloth  6 each Topical Daily   clopidogrel   75 mg Oral Daily   metoprolol tartrate  5 mg Intravenous Q6H   pantoprazole   40 mg Oral Daily   sodium chloride  flush  3 mL Intravenous Q12H   sodium chloride  flush  3 mL Intravenous Q12H   Continuous Infusions:  sodium chloride      sodium chloride  Stopped (12/10/23 0700)   sodium chloride  10 mL/hr at 12/10/23 0800    Nutritional status     Body mass index is 20.94 kg/m.  Data Reviewed:   CBC: Recent Labs  Lab 12/08/23 2138 12/08/23 2233 12/09/23 0634 12/09/23 1611 12/10/23 0510  WBC  --  6.7 6.4 12.2* 8.1  NEUTROABS  --  3.3  --   --   --   HGB 11.6* 14.2 10.8* 9.8* 9.2*  HCT 34.0* 45.4 34.2* 31.4* 29.0*  MCV  --  94.4 95.0 96.0 94.8  PLT  --  146* 143* 124* 118*   Basic Metabolic Panel: Recent Labs  Lab 12/08/23 2129 12/08/23 2138 12/09/23 0634 12/10/23 0510  NA 136 139 140 136  K 2.7* 2.7* 3.9 3.7  CL 100 100 104 104  CO2 25  --  27 24  GLUCOSE 76 75 92 82  BUN <5* <3* <5* <5*  CREATININE 0.60 0.60 0.68 0.58  CALCIUM  8.1*  --  8.2* 7.7*  MG 1.2*  --  1.7  --   PHOS 3.3  --  3.1  --    GFR: Estimated Creatinine Clearance: 67 mL/min (by C-G formula based on SCr of 0.58 mg/dL). Liver Function Tests: Recent Labs  Lab  12/08/23 2129 12/09/23 0634  AST 23 20  ALT 12 12  ALKPHOS 70 68  BILITOT 1.1 0.8  PROT 6.6 6.2*  ALBUMIN  2.8* 2.6*   No results for input(s): LIPASE, AMYLASE in the last 168 hours. No results for input(s): AMMONIA in the last 168 hours. Coagulation Profile: Recent Labs  Lab 12/08/23 2129  INR 1.1   Cardiac Enzymes: Recent Labs  Lab 12/08/23 2129  CKTOTAL 147   BNP (last 3 results) No results for input(s): PROBNP in the last 8760 hours.  HbA1C: No results for input(s): HGBA1C in the last 72 hours. CBG: Recent Labs  Lab 12/08/23 2236 12/09/23 0122 12/09/23 0247 12/09/23 0539 12/09/23 1710  GLUCAP 93 84 106* 86 91   Lipid Profile: Recent Labs    12/10/23 0510  CHOL 83  HDL 38*  LDLCALC 35  TRIG 51  CHOLHDL 2.2   Thyroid  Function Tests: Recent Labs    12/08/23 2129  TSH 5.443*   Anemia Panel: No results for input(s): VITAMINB12, FOLATE, FERRITIN, TIBC, IRON, RETICCTPCT in the last 72 hours. Sepsis Labs: Recent Labs  Lab 12/08/23 2233 12/09/23 0527  LATICACIDVEN 2.1* 1.6    No results found for this or any previous visit (from the past 240 hours).       Radiology Studies: PERIPHERAL VASCULAR CATHETERIZATION Result Date: 12/09/2023 Images from the original result were not included. Patient name: Crystal Patterson MRN: [id number] DOB: February 04, 1966 Sex: female 12/09/2023 Pre-operative Diagnosis: Follow up lysis Post-operative diagnosis:  Same Surgeon:  Malvina New Procedure Performed:  1.  Follow-up lysis study  2.  Right leg angiogram  3.  Mechanical thrombectomy right superficial femoral-popliteal artery  4.  Stent, right superficial femoral and popliteal artery (6 mm Viabahn)  5.  Angioplasty right tibioperoneal trunk and posterior tibial artery  6.  Conscious sedation, 58 minutes  7.  Closure device, Celt Indications: This is a 58 year old female with Rutherford 1A right leg ischemia who was found to have an occluded right SFA  stent.  She had a lysis catheter placed earlier today catheter directed lytic therapy for 6 hours.  She is back for repeat study Procedure:  The patient was identified in the holding area and taken to room 8.  The patient was then placed supine on the table and prepped and draped in the usual sterile fashion.  A time out was called.  Conscious sedation was administered with the use of IV fentanyl  and Versed  under continuous physician and nurse monitoring.  Heart rate, blood pressure, and oxygen  saturation were continuously monitored.  Total sedation time was 58 minutes.  A Glidewire advantage was placed through the lysis catheter which was removed.  Contrast injections were performed through the sheath which showed the stent to be now patent.  There was residual thrombus within the superficial femoral-popliteal artery.  The dominant runoff was through the anterior tibial however there was poor opacification of the vessels at all of the foot. I then upsized the 6 French sheath to a 7 [place] sheath.  The patient was fully heparinized.  Using a penumbra CAT 7 device mechanical thrombectomy was performed of the superficial femoral-popliteal artery.  2 passes down the back were performed.  Repeat imaging showed evacuation of some of the residual thrombus.  There was a lesion just proximal to the joint space that appeared calcified.  At this point I elected to stent the lesion and the existing SFA stent.  I placed a 6-50 followed by 6 100 Viabahn stents which were postdilated with a 5 mm balloon.  Follow-up imaging showed inline flow through the superficial femoral and popliteal artery stents down to the knee without any residual thrombus or stenosis. I then performed additional imaging of the runoff vessels.  The anterior tibial was patent down across the ankle.  The posterior tibial which was patent a year ago had a moderate segment occlusion.  Using a V-18 wire and a 3 x 100 Sterling balloon I was able to cross the  occlusion in the posterior tibial artery and  diseased tibioperoneal trunk.  I performed balloon angioplasty with 3 mm balloon.  Follow-up imaging showed a patent posterior tibial artery however just distal to where I had treated there was a residual stenosis.  I inserted a 2 x 100 balloon and repeated balloon angioplasty at this level.  On subsequent imaging there was spasm within the vessel and I did not have inline flow through the foot and so I repeated angioplasty with the 2 mm balloon down across the ankle.  I also felt that there was some spasm here and so I injected 600 mcg of nitroglycerin  into the posterior tibial artery.  I then performed completion imaging which showed two-vessel runoff through the anterior tibial and peroneal artery.  I was satisfied with these results.  I exchanged the 7 French sheath out for short 7 I closed the groin with a Celt Impression:  #1  Patent SFA stent after thrombolytic therapy however there was residual thrombus that was evacuated with a penumbra CAT 7 device.  #2  Covered stenting from the joint space up into the proximal SFA using overlapping 6 mm Viabahn stents  #3  Recanalization and subsequent angioplasty of an occluded posterior tibial artery using a 2 mm balloon down by the ankle and a 3 mm balloon proximally  #4  The patient now has inline flow through the posterior tibial and anterior tibial artery and patent superficial femoral and popliteal artery stents  V. Malvina New, M.D., [facility] [place] Office: [phone] Pager:  [phone]   PERIPHERAL VASCULAR CATHETERIZATION Result Date: 12/09/2023 Images from the original result were not included.   Patient name: Crystal Patterson  MRN: [id number]        DOB: [birth year]          Sex: female  12/08/2023 - 12/09/2023 Pre-operative Diagnosis: Right lower extremity acute limb ischemia-Rutherford 1A Post-operative diagnosis:  Same Surgeon:  Fonda FORBES Rim, MD Procedure Performed: 1.   Ultrasound-guided micropuncture access of the left common femoral artery in retrograde fashion 2.  Aortogram 3.  Second-order cannulation, right lower extremity angiogram Selective angiography of the popliteal artery 4.  Initiation of 40 cm EKOS pharmacomechanical thrombolysis extending from the popliteal artery through the superficial femoral artery 5.  Moderate sedation time 25 minutes, contrast volume 35 mL    Indications: Patient is a 58 year old female presenting with a 3-day history of right lower extremity acute limb ischemia with rest pain.  Prior history of right sided superficial femoral artery stents.  Unfortunately has missed follow-up with our office for over a year.  She has been immobile for several days, stating she has been sick.  After discussing risks and benefits of right lower extremity diagnostic angiography in effort to define improve distal perfusion, Jailynne elected to proceed.  She is aware that this will likely result in left-sided lysis catheter in effort to recanalize the previously placed stent.  Findings: Aortogram: Patent celiac artery, superior mesenteric artery, bilateral renal arteries.  No flow-limiting stenosis in the aortoiliac segments bilaterally.  On the right: Widely patent common femoral artery, profunda.  The superficial femoral artery is occluded proximally, with reconstitution at the P2 segment of the popliteal artery.  Distally, there is three-vessel runoff to the foot.  Tibial vessels are atretic, and calcific.  Severe small vessel disease in the foot.             Procedure:  The patient was identified in the holding area and taken to room 8.  The  patient was then placed supine on the table and prepped and draped in the usual sterile fashion.  A time out was called.  Ultrasound was used to evaluate the left common femoral artery.  It was patent .  A digital ultrasound image was acquired.  A micropuncture needle was used to access the left common femoral artery under  ultrasound guidance.  An 018 wire was advanced without resistance and a micropuncture sheath was placed.  The 018 wire was removed and a benson wire was placed.  The micropuncture sheath was exchanged for a 5 french sheath.  An omniflush catheter was advanced over the wire to the level of L-1.  An abdominal angiogram was obtained.  Next, using the omniflush catheter and a benson wire, the aortic bifurcation was crossed and the catheter was placed into theright external iliac artery and right runoff was obtained.  See results above.  I elected to attempt to cross the superficial femoral artery occlusion, previously placed stent, popliteal artery for lytic catheter placement.  The patient was heparinized and a 6 x 45 cm sheath was brought onto the field and parked in the right common femoral artery.  Next, using a series of wires and catheters, the superficial femoral artery was cannulated, and the wire driven through the superficial femoral artery, popliteal artery into the tibioperoneal trunk.  This was followed by 90 cm catheter to ensure that was true lumen.  Selective angiography of the popliteal artery followed.  Next, a 40 cm EKOS catheter was brought onto the field and parked in the P2 segment of the popliteal artery.  The pharmacomechanical thrombolysis catheter was then placed, and lysis initiated at 1 mg/h of tPA.   Patient tolerated the procedure well. Plan for return to Cath Lab later today versus tomorrow.   Fonda FORBES Rim MD [facility] [place] Office: [phone]   DG CHEST PORT 1 VIEW Result Date: 12/09/2023 EXAM: 1 VIEW(S) XRAY OF THE CHEST 12/09/2023 10:25:00 AM COMPARISON: 12/07/2026 CLINICAL HISTORY: Dyspnea. FINDINGS: LINES, TUBES AND DEVICES: Stable left subclavian port catheter to the proximal SVC. LUNGS AND PLEURA: No focal pulmonary opacity. No pulmonary edema. No pleural effusion. No pneumothorax. HEART AND MEDIASTINUM: Heavy mitral annulus calcifications. BONES  AND SOFT TISSUES: No acute osseous abnormality. IMPRESSION: 1. No acute abnormalities. Electronically signed by: Katheleen Faes MD 12/09/2023 10:40 AM EDT RP Workstation: [id number]       LOS: 2 days   Time spent= 39 mins    Deliliah Room, MD [facility]  If 7PM-7AM, please contact night-coverage  12/10/2023, 9:26 AM

## 2023-12-10 NOTE — Progress Notes (Signed)
 PHARMACIST LIPID MONITORING   Crystal Patterson is a 58 y.o. female admitted on 12/08/2023 with critical limb ischemia.  Pharmacy has been consulted to optimize lipid-lowering therapy with the indication of secondary prevention for clinical ASCVD.  Recent Labs:  Lipid Panel (last 6 months):   Lab Results  Component Value Date   CHOL 83 12/10/2023   TRIG 51 12/10/2023   HDL 38 (L) 12/10/2023   CHOLHDL 2.2 12/10/2023   VLDL 10 12/10/2023   LDLCALC 35 12/10/2023    Hepatic function panel (last 6 months):   Lab Results  Component Value Date   AST 20 12/09/2023   ALT 12 12/09/2023   ALKPHOS 68 12/09/2023   BILITOT 0.8 12/09/2023   BILIDIR 0.3 (H) 12/08/2023   IBILI 0.8 12/08/2023    SCr (since admission):   Serum creatinine: 0.58 mg/dL 89/88/74 9489 Estimated creatinine clearance: 67 mL/min  Current therapy and lipid therapy tolerance Current lipid-lowering therapy: atorvastatin  40 mg daily,  Previous lipid-lowering therapies (if applicable): n/a Documented or reported allergies or intolerances to lipid-lowering therapies (if applicable): n/a  Assessment:   Currently at goal LDL with most recent LDL of 35. No changes required at this time.   Plan:    1.Statin intensity (high intensity recommended for all patients regardless of the LDL):  No statin changes. The patient is already on a high intensity statin.  2.Add ezetimibe (if any one of the following):   Not indicated at this time.  3.Refer to lipid clinic:   No  4.Follow-up with:  Primary care provider - Fanta, Tesfaye Demissie, MD  5.Follow-up labs after discharge:  No changes in lipid therapy, repeat a lipid panel in one year.       Thank you for allowing pharmacy to be a part of this patient's care.   Nidia Schaffer, PharmD PGY2 Cardiology Pharmacy Resident  Please check AMION for all [facility] phone numbers After 10:00 PM, call Main Pharmacy [phone] 12/10/2023, 12:03 PM

## 2023-12-10 NOTE — Discharge Instructions (Addendum)
Information on my medicine - ELIQUIS (apixaban)  This medication education was reviewed with me or my healthcare representative as part of my discharge preparation.   Why was Eliquis prescribed for you? Eliquis was prescribed to treat blood clots that may have been found in the veins of your legs (deep vein thrombosis) or in your lungs (pulmonary embolism) and to reduce the risk of them occurring again.  What do You need to know about Eliquis ? Take ONE 5 mg tablet taken TWICE daily.  Eliquis may be taken with or without food.   Try to take the dose about the same time in the morning and in the evening. If you have difficulty swallowing the tablet whole please discuss with your pharmacist how to take the medication safely.  Take Eliquis exactly as prescribed and DO NOT stop taking Eliquis without talking to the doctor who prescribed the medication.  Stopping may increase your risk of developing a new blood clot.  Refill your prescription before you run out.  After discharge, you should have regular check-up appointments with your healthcare provider that is prescribing your Eliquis.    What do you do if you miss a dose? If a dose of ELIQUIS is not taken at the scheduled time, take it as soon as possible on the same day and twice-daily administration should be resumed. The dose should not be doubled to make up for a missed dose.  Important Safety Information A possible side effect of Eliquis is bleeding. You should call your healthcare provider right away if you experience any of the following: ? Bleeding from an injury or your nose that does not stop. ? Unusual colored urine (red or dark brown) or unusual colored stools (red or black). ? Unusual bruising for unknown reasons. ? A serious fall or if you hit your head (even if there is no bleeding).  Some medicines may interact with Eliquis and might increase your risk of bleeding or clotting while on Eliquis. To help avoid this,  consult your healthcare provider or pharmacist prior to using any new prescription or non-prescription medications, including herbals, vitamins, non-steroidal anti-inflammatory drugs (NSAIDs) and supplements.  This website has more information on Eliquis (apixaban): http://www.eliquis.com/eliquis/home

## 2023-12-10 NOTE — Progress Notes (Addendum)
    Subjective  - POD #1, status post thrombolysis and restenting of her right SFA and angioplasty of the posterior tibial artery  Right leg pain significantly improved   Physical Exam:  Left groin cannulation soft but tender Brisk posterior tibial and dorsalis pedis Doppler signals       Assessment/Plan:  POD #1  -Status post successful revascularization of the right leg.  Recommend triple therapy including aspirin , Eliquis and Plavix  for 1 month, then transitioning to aspirin  and Eliquis indefinitely -Continue statin therapy - Stable for discharge from vascular perspective  Wells Zohar Laing 12/10/2023 9:54 AM --  Vitals:   12/10/23 0900 12/10/23 0920  BP: 104/77   Pulse: 66 61  Resp: 12 19  Temp:    SpO2: 97% 100%    Intake/Output Summary (Last 24 hours) at 12/10/2023 0954 Last data filed at 12/10/2023 0900 Gross per 24 hour  Intake 2239.09 ml  Output 1500 ml  Net 739.09 ml     Laboratory CBC    Component Value Date/Time   WBC 8.1 12/10/2023 0510   HGB 9.2 (L) 12/10/2023 0510   HCT 29.0 (L) 12/10/2023 0510   PLT 118 (L) 12/10/2023 0510    BMET    Component Value Date/Time   NA 136 12/10/2023 0510   K 3.7 12/10/2023 0510   CL 104 12/10/2023 0510   CO2 24 12/10/2023 0510   GLUCOSE 82 12/10/2023 0510   BUN <5 (L) 12/10/2023 0510   CREATININE 0.58 12/10/2023 0510   CALCIUM  7.7 (L) 12/10/2023 0510   GFRNONAA >60 12/10/2023 0510   GFRAA >60 12/07/2016 0012    COAG Lab Results  Component Value Date   INR 1.1 12/08/2023   INR 0.98 12/06/2016   INR 1.15 05/25/2016   No results found for: PTT  Antibiotics Anti-infectives (From admission, onward)    None        V. Malvina Serene CLORE, M.D., [facility] [place] Office: [phone] Pager:  [phone]

## 2023-12-11 DIAGNOSIS — I70221 Atherosclerosis of native arteries of extremities with rest pain, right leg: Secondary | ICD-10-CM | POA: Diagnosis not present

## 2023-12-11 LAB — CBC
HCT: 28.1 % — ABNORMAL LOW (ref 36.0–46.0)
Hemoglobin: 9.1 g/dL — ABNORMAL LOW (ref 12.0–15.0)
MCH: 30.5 pg (ref 26.0–34.0)
MCHC: 32.4 g/dL (ref 30.0–36.0)
MCV: 94.3 fL (ref 80.0–100.0)
Platelets: 118 K/uL — ABNORMAL LOW (ref 150–400)
RBC: 2.98 MIL/uL — ABNORMAL LOW (ref 3.87–5.11)
RDW: 14.1 % (ref 11.5–15.5)
WBC: 8.6 K/uL (ref 4.0–10.5)
nRBC: 0 % (ref 0.0–0.2)

## 2023-12-11 MED ORDER — HEPARIN SOD (PORK) LOCK FLUSH 100 UNIT/ML IV SOLN
500.0000 [IU] | INTRAVENOUS | Status: AC | PRN
  Administered 2023-12-11: 500 [IU]

## 2023-12-11 MED ORDER — APIXABAN 5 MG PO TABS: 5.0000 mg | ORAL_TABLET | Freq: Two times a day (BID) | ORAL | 0 refills | Status: AC

## 2023-12-11 MED ORDER — CLOPIDOGREL BISULFATE 75 MG PO TABS: 75.0000 mg | ORAL_TABLET | Freq: Every day | ORAL | 0 refills | Status: AC

## 2023-12-11 NOTE — Discharge Summary (Signed)
 Physician Discharge Summary   Patient: Crystal Patterson MRN: [id number] DOB: 1965/07/29  Admit date:     12/08/2023  Discharge date: 12/11/23  Discharge Physician: Deliliah Room   PCP: Carlette Benita Area, MD   Recommendations at discharge:    F/u with PCP in one week F/u with vascular surgery in one month Take aspirin , plavix  and eliquis for 3 months and then aspirin  and eliquis indefinitely. Return to ED if you develop worsening leg pain or bleeding from any site.  Discharge Diagnoses: Principal Problem:   Critical limb ischemia of right lower extremity (HCC) Active Problems:   HYPOKALEMIA   Essential hypertension   Asthma   PAD (peripheral artery disease)   CAD (coronary artery disease)   History of lupus nephritis   Tobacco use disorder   Marijuana use   Prolonged QT interval   Hypoglycemia   Acute urinary retention   Hospital Course:  58 y.o. female with medical history significant of HTN , PAD, left AKA, CKD, scleroderma, SLE, GERD, gout  Admitted for critical limb ischemia of right lower extremity and underwent  s/p thrombolysis and restenting of her right SFA and angioplasty of the posterior tibial artery. Off of heparin  drip now. S/p Ultrasound-guided micropuncture access of the left common femoral artery in retrograde fashion , Aortogram ,Second-order cannulation, right lower extremity angiogram and Initiation of 40 cm EKOS pharmacomechanical thrombolysis extending from the popliteal artery through the superficial femoral artery done on 10/10 by Dr Lanis.   Vascular surgery recommended triple therapy including aspirin , Eliquis and Plavix  for 1 month, then transitioning to aspirin  and Eliquis indefinitely.  She was discharged in a stable condition. She will be staying at her sister's place.  Outpatient follow up with vascular in one month.         Consultants: Vascular Procedures performed: status post thrombolysis and restenting of her right SFA and  angioplasty of the posterior tibial artery   Disposition: Home Diet recommendation:  Regular diet DISCHARGE MEDICATION: Allergies as of 12/11/2023   No Known Allergies      Medication List     STOP taking these medications    amLODipine  10 MG tablet Commonly known as: NORVASC    atenolol  25 MG tablet Commonly known as: TENORMIN    benazepril  40 MG tablet Commonly known as: LOTENSIN        TAKE these medications    acetaminophen  500 MG tablet Commonly known as: TYLENOL  Take 1,000 mg by mouth 2 (two) times daily as needed for headache or fever (pain).   allopurinol  100 MG tablet Commonly known as: ZYLOPRIM  Take 1 tablet (100 mg total) by mouth daily.   apixaban 5 MG Tabs tablet Commonly known as: ELIQUIS Take 1 tablet (5 mg total) by mouth 2 (two) times daily.   aspirin  EC 81 MG tablet Take 81 mg by mouth daily.   atorvastatin  40 MG tablet Commonly known as: LIPITOR  Take 1 tablet (40 mg total) by mouth daily at 6 PM. What changed: when to take this   clopidogrel  75 MG tablet Commonly known as: PLAVIX  Take 1 tablet (75 mg total) by mouth daily. Start taking on: December 12, 2023   furosemide  40 MG tablet Commonly known as: LASIX  Take 1 tablet (40 mg total) by mouth daily.   gabapentin  300 MG capsule Commonly known as: NEURONTIN  Take 1 capsule (300 mg total) by mouth daily. What changed:  when to take this additional instructions   ibuprofen  800 MG tablet Commonly known as: ADVIL  Take 800 mg  by mouth 2 (two) times daily as needed for moderate pain.   omeprazole  20 MG capsule Commonly known as: PRILOSEC Take 1 capsule (20 mg total) by mouth daily.   potassium chloride  10 MEQ tablet Commonly known as: KLOR-CON  Take 1 tablet (10 mEq total) by mouth daily.   traZODone 100 MG tablet Commonly known as: DESYREL Take 150 mg by mouth at bedtime.        Follow-up Information     [facility]  Follow up in 1 month(s).   Specialty: Vascular Surgery Why: The office will call you with your appointment Contact information: [address] [place]  [phone]        Carlette Benita Area, MD. Schedule an appointment as soon as possible for a visit in 1 month(s).   Specialty: Internal Medicine Contact information: [address] [place] [address] [phone]                Discharge Exam: [name]   12/08/23 2104  Weight: 55.3 kg   General exam: NAD, calm Respiratory system: Clear to auscultation. Respiratory effort normal. Cardiovascular system: S1 & S2 heard, RRR. No JVD, murmurs, rubs, gallops or clicks. No pedal edema. Absent RLE distal pulses Gastrointestinal system: Abdomen is nondistended, soft and nontender. No organomegaly or masses felt. Normal bowel sounds heard. Central nervous system: Alert and oriented. No focal neurological deficits. Extremities: Right foot is warm, s/p Left AKA Skin: No rashes, lesions or ulcers Psychiatry: Judgement and insight appear normal. Mood & affect appropriate.  Condition at discharge: good  The results of significant diagnostics from this hospitalization (including imaging, microbiology, ancillary and laboratory) are listed below for reference.   Imaging Studies: PERIPHERAL VASCULAR CATHETERIZATION Result Date: 12/09/2023 Images from the original result were not included. Patient name: Crystal Patterson MRN: [id number] DOB: January 06, 1966 Sex: female 12/09/2023 Pre-operative Diagnosis: Follow up lysis Post-operative diagnosis:  Same Surgeon:  Malvina New Procedure Performed:  1.  Follow-up lysis study  2.  Right leg angiogram  3.  Mechanical thrombectomy right superficial femoral-popliteal artery  4.  Stent, right superficial femoral and popliteal artery (6 mm Viabahn)  5.  Angioplasty right tibioperoneal trunk and posterior tibial artery  6.  Conscious sedation, 58  minutes  7.  Closure device, Celt Indications: This is a 58 year old female with Rutherford 1A right leg ischemia who was found to have an occluded right SFA stent.  She had a lysis catheter placed earlier today catheter directed lytic therapy for 6 hours.  She is back for repeat study Procedure:  The patient was identified in the holding area and taken to room 8.  The patient was then placed supine on the table and prepped and draped in the usual sterile fashion.  A time out was called.  Conscious sedation was administered with the use of IV fentanyl  and Versed  under continuous physician and nurse monitoring.  Heart rate, blood pressure, and oxygen  saturation were continuously monitored.  Total sedation time was 58 minutes.  A Glidewire advantage was placed through the lysis catheter which was removed.  Contrast injections were performed through the sheath which showed the stent to be now patent.  There was residual thrombus within the superficial femoral-popliteal artery.  The dominant runoff was through the anterior tibial however there was poor opacification of the vessels at all of the foot. I then upsized the 6 French sheath to  a 7 [place] sheath.  The patient was fully heparinized.  Using a penumbra CAT 7 device mechanical thrombectomy was performed of the superficial femoral-popliteal artery.  2 passes down the back were performed.  Repeat imaging showed evacuation of some of the residual thrombus.  There was a lesion just proximal to the joint space that appeared calcified.  At this point I elected to stent the lesion and the existing SFA stent.  I placed a 6-50 followed by 6 100 Viabahn stents which were postdilated with a 5 mm balloon.  Follow-up imaging showed inline flow through the superficial femoral and popliteal artery stents down to the knee without any residual thrombus or stenosis. I then performed additional imaging of the runoff vessels.  The anterior tibial was patent down across the ankle.   The posterior tibial which was patent a year ago had a moderate segment occlusion.  Using a V-18 wire and a 3 x 100 Sterling balloon I was able to cross the occlusion in the posterior tibial artery and diseased tibioperoneal trunk.  I performed balloon angioplasty with 3 mm balloon.  Follow-up imaging showed a patent posterior tibial artery however just distal to where I had treated there was a residual stenosis.  I inserted a 2 x 100 balloon and repeated balloon angioplasty at this level.  On subsequent imaging there was spasm within the vessel and I did not have inline flow through the foot and so I repeated angioplasty with the 2 mm balloon down across the ankle.  I also felt that there was some spasm here and so I injected 600 mcg of nitroglycerin  into the posterior tibial artery.  I then performed completion imaging which showed two-vessel runoff through the anterior tibial and peroneal artery.  I was satisfied with these results.  I exchanged the 7 French sheath out for short 7 I closed the groin with a Celt Impression:  #1  Patent SFA stent after thrombolytic therapy however there was residual thrombus that was evacuated with a penumbra CAT 7 device.  #2  Covered stenting from the joint space up into the proximal SFA using overlapping 6 mm Viabahn stents  #3  Recanalization and subsequent angioplasty of an occluded posterior tibial artery using a 2 mm balloon down by the ankle and a 3 mm balloon proximally  #4  The patient now has inline flow through the posterior tibial and anterior tibial artery and patent superficial femoral and popliteal artery stents  V. Malvina New, M.D., [facility] [place] Office: [phone] Pager:  [phone]   PERIPHERAL VASCULAR CATHETERIZATION Result Date: 12/09/2023 Images from the original result were not included.   Patient name: Crystal Patterson  MRN: [id number]        DOB: [birth year]          Sex: female  12/08/2023 - 12/09/2023 Pre-operative  Diagnosis: Right lower extremity acute limb ischemia-Rutherford 1A Post-operative diagnosis:  Same Surgeon:  Fonda FORBES Rim, MD Procedure Performed: 1.  Ultrasound-guided micropuncture access of the left common femoral artery in retrograde fashion 2.  Aortogram 3.  Second-order cannulation, right lower extremity angiogram Selective angiography of the popliteal artery 4.  Initiation of 40 cm EKOS pharmacomechanical thrombolysis extending from the popliteal artery through the superficial femoral artery 5.  Moderate sedation time 25 minutes, contrast volume 35 mL    Indications: Patient is a 58 year old female presenting with a 3-day history of right lower extremity acute limb ischemia with rest pain.  Prior history of  right sided superficial femoral artery stents.  Unfortunately has missed follow-up with our office for over a year.  She has been immobile for several days, stating she has been sick.  After discussing risks and benefits of right lower extremity diagnostic angiography in effort to define improve distal perfusion, Brandilee elected to proceed.  She is aware that this will likely result in left-sided lysis catheter in effort to recanalize the previously placed stent.  Findings: Aortogram: Patent celiac artery, superior mesenteric artery, bilateral renal arteries.  No flow-limiting stenosis in the aortoiliac segments bilaterally.  On the right: Widely patent common femoral artery, profunda.  The superficial femoral artery is occluded proximally, with reconstitution at the P2 segment of the popliteal artery.  Distally, there is three-vessel runoff to the foot.  Tibial vessels are atretic, and calcific.  Severe small vessel disease in the foot.             Procedure:  The patient was identified in the holding area and taken to room 8.  The patient was then placed supine on the table and prepped and draped in the usual sterile fashion.  A time out was called.  Ultrasound was used to evaluate the left common  femoral artery.  It was patent .  A digital ultrasound image was acquired.  A micropuncture needle was used to access the left common femoral artery under ultrasound guidance.  An 018 wire was advanced without resistance and a micropuncture sheath was placed.  The 018 wire was removed and a benson wire was placed.  The micropuncture sheath was exchanged for a 5 french sheath.  An omniflush catheter was advanced over the wire to the level of L-1.  An abdominal angiogram was obtained.  Next, using the omniflush catheter and a benson wire, the aortic bifurcation was crossed and the catheter was placed into theright external iliac artery and right runoff was obtained.  See results above.  I elected to attempt to cross the superficial femoral artery occlusion, previously placed stent, popliteal artery for lytic catheter placement.  The patient was heparinized and a 6 x 45 cm sheath was brought onto the field and parked in the right common femoral artery.  Next, using a series of wires and catheters, the superficial femoral artery was cannulated, and the wire driven through the superficial femoral artery, popliteal artery into the tibioperoneal trunk.  This was followed by 90 cm catheter to ensure that was true lumen.  Selective angiography of the popliteal artery followed.  Next, a 40 cm EKOS catheter was brought onto the field and parked in the P2 segment of the popliteal artery.  The pharmacomechanical thrombolysis catheter was then placed, and lysis initiated at 1 mg/h of tPA.   Patient tolerated the procedure well. Plan for return to Cath Lab later today versus tomorrow.   Fonda FORBES Rim MD [facility] [place] Office: [phone]   DG CHEST PORT 1 VIEW Result Date: 12/09/2023 EXAM: 1 VIEW(S) XRAY OF THE CHEST 12/09/2023 10:25:00 AM COMPARISON: 12/07/2026 CLINICAL HISTORY: Dyspnea. FINDINGS: LINES, TUBES AND DEVICES: Stable left subclavian port catheter to the proximal SVC. LUNGS AND  PLEURA: No focal pulmonary opacity. No pulmonary edema. No pleural effusion. No pneumothorax. HEART AND MEDIASTINUM: Heavy mitral annulus calcifications. BONES AND SOFT TISSUES: No acute osseous abnormality. IMPRESSION: 1. No acute abnormalities. Electronically signed by: Dayne Hassell MD 12/09/2023 10:40 AM EDT RP Workstation: [id number]    Microbiology: Results for orders placed or performed during the hospital encounter  of 04/04/20  SARS CORONAVIRUS 2 (TAT 6-24 HRS) Nasopharyngeal Nasopharyngeal Swab     Status: None   Collection Time: 04/07/20  8:58 AM   Specimen: Nasopharyngeal Swab  Result Value Ref Range Status   SARS Coronavirus 2 NEGATIVE NEGATIVE Final    Comment: (NOTE) SARS-CoV-2 target nucleic acids are NOT DETECTED.  The SARS-CoV-2 RNA is generally detectable in upper and lower respiratory specimens during the acute phase of infection. Negative results do not preclude SARS-CoV-2 infection, do not rule out co-infections with other pathogens, and should not be used as the sole basis for treatment or other patient management decisions. Negative results must be combined with clinical observations, patient history, and epidemiological information. The expected result is Negative.  Fact Sheet for Patients: HairSlick.no  Fact Sheet for Healthcare Providers: quierodirigir.com  This test is not yet approved or cleared by the [place]  FDA and  has been authorized for detection and/or diagnosis of SARS-CoV-2 by FDA under an Emergency Use Authorization (EUA). This EUA will remain  in effect (meaning this test can be used) for the duration of the COVID-19 declaration under Se ction 564(b)(1) of the Act, 21 U.S.C. section 360bbb-3(b)(1), unless the authorization is terminated or revoked sooner.  Performed at [facility], [address], [place], [place] [address]   SARS CORONAVIRUS 2 (TAT 6-24 HRS) Nasopharyngeal  Nasopharyngeal Swab     Status: None   Collection Time: 04/11/20  6:58 AM   Specimen: Nasopharyngeal Swab  Result Value Ref Range Status   SARS Coronavirus 2 NEGATIVE NEGATIVE Final    Comment: (NOTE) SARS-CoV-2 target nucleic acids are NOT DETECTED.  The SARS-CoV-2 RNA is generally detectable in upper and lower respiratory specimens during the acute phase of infection. Negative results do not preclude SARS-CoV-2 infection, do not rule out co-infections with other pathogens, and should not be used as the sole basis for treatment or other patient management decisions. Negative results must be combined with clinical observations, patient history, and epidemiological information. The expected result is Negative.  Fact Sheet for Patients: HairSlick.no  Fact Sheet for Healthcare Providers: quierodirigir.com  This test is not yet approved or cleared by the [place]  FDA and  has been authorized for detection and/or diagnosis of SARS-CoV-2 by FDA under an Emergency Use Authorization (EUA). This EUA will remain  in effect (meaning this test can be used) for the duration of the COVID-19 declaration under Se ction 564(b)(1) of the Act, 21 U.S.C. section 360bbb-3(b)(1), unless the authorization is terminated or revoked sooner.  Performed at [facility], [address], [place], [place] [address]     Labs: CBC: Recent Labs  Lab 12/08/23 2233 12/09/23 0634 12/09/23 1611 12/10/23 0510 12/11/23 0316  WBC 6.7 6.4 12.2* 8.1 8.6  NEUTROABS 3.3  --   --   --   --   HGB 14.2 10.8* 9.8* 9.2* 9.1*  HCT 45.4 34.2* 31.4* 29.0* 28.1*  MCV 94.4 95.0 96.0 94.8 94.3  PLT 146* 143* 124* 118* 118*   Basic Metabolic Panel: Recent Labs  Lab 12/08/23 2129 12/08/23 2138 12/09/23 0634 12/10/23 0510  NA 136 139 140 136  K 2.7* 2.7* 3.9 3.7  CL 100 100 104 104  CO2 25  --  27 24  GLUCOSE 76 75 92 82  BUN <5* <3* <5* <5*   CREATININE 0.60 0.60 0.68 0.58  CALCIUM  8.1*  --  8.2* 7.7*  MG 1.2*  --  1.7  --   PHOS  3.3  --  3.1  --    Liver Function Tests: Recent Labs  Lab 12/08/23 2129 12/09/23 0634  AST 23 20  ALT 12 12  ALKPHOS 70 68  BILITOT 1.1 0.8  PROT 6.6 6.2*  ALBUMIN  2.8* 2.6*   CBG: Recent Labs  Lab 12/08/23 2236 12/09/23 0122 12/09/23 0247 12/09/23 0539 12/09/23 1710  GLUCAP 93 84 106* 86 91    Discharge time spent: 42 minutes.  Signed: Deliliah Room, MD [facility] 12/11/2023

## 2023-12-11 NOTE — Progress Notes (Addendum)
  Progress Note    12/11/2023 8:39 AM 2 Days Post-Op  Subjective:  no complaints   Vitals:   12/10/23 2157 12/11/23 0426  BP: 106/83 105/88  Pulse: 66 60  Resp: 14 18  Temp: 98.9 F (37.2 C) 98.5 F (36.9 C)  SpO2: 97% 95%   Physical Exam: Cardiac:  regular Lungs:  non labored Extremities:  left CF access site without swelling or hematoma, RLE remains well perfused and warm with palpable right DP and PT doppler signal Abdomen:  soft Neurologic: alert and oriented  CBC    Component Value Date/Time   WBC 8.6 12/11/2023 0316   RBC 2.98 (L) 12/11/2023 0316   HGB 9.1 (L) 12/11/2023 0316   HCT 28.1 (L) 12/11/2023 0316   PLT 118 (L) 12/11/2023 0316   MCV 94.3 12/11/2023 0316   MCH 30.5 12/11/2023 0316   MCHC 32.4 12/11/2023 0316   RDW 14.1 12/11/2023 0316   LYMPHSABS 2.5 12/08/2023 2233   MONOABS 0.7 12/08/2023 2233   EOSABS 0.2 12/08/2023 2233   BASOSABS 0.1 12/08/2023 2233    BMET    Component Value Date/Time   NA 136 12/10/2023 0510   K 3.7 12/10/2023 0510   CL 104 12/10/2023 0510   CO2 24 12/10/2023 0510   GLUCOSE 82 12/10/2023 0510   BUN <5 (L) 12/10/2023 0510   CREATININE 0.58 12/10/2023 0510   CALCIUM  7.7 (L) 12/10/2023 0510   GFRNONAA >60 12/10/2023 0510   GFRAA >60 12/07/2016 0012    INR    Component Value Date/Time   INR 1.1 12/08/2023 2129     Intake/Output Summary (Last 24 hours) at 12/11/2023 0839 Last data filed at 12/11/2023 0415 Gross per 24 hour  Intake 47.26 ml  Output 1475 ml  Net -1427.74 ml     Assessment/Plan:  58 y.o. female is s/p thrombolysis and restenting of her right SFA and angioplasty of the posterior tibial artery  2 Days Post-Op   RLE remains well perfused and warm with palpable DP and doppler PT signals Left CF access site without swelling or hematoma Continue Aspirin  and statin  Okay for discharge from vascular standpoint She will have follow up arranged in 1 month with ABI and LLE arterial duplex  Crystal Damme, PA-C [facility] [phone] 12/11/2023 8:39 AM  I agree with the above.  Stable for discharge from vascular perspective.  Recommend triple therapy including aspirin , Eliquis, and Plavix  for 1 month and then transition to aspirin  and Eliquis.  Continue statin.  Crystal Patterson

## 2023-12-11 NOTE — Plan of Care (Signed)

## 2023-12-12 ENCOUNTER — Encounter (HOSPITAL_COMMUNITY): Payer: Self-pay | Admitting: Vascular Surgery

## 2023-12-22 DIAGNOSIS — M329 Systemic lupus erythematosus, unspecified: Secondary | ICD-10-CM | POA: Diagnosis not present

## 2023-12-22 DIAGNOSIS — N390 Urinary tract infection, site not specified: Secondary | ICD-10-CM | POA: Diagnosis not present

## 2023-12-22 DIAGNOSIS — I739 Peripheral vascular disease, unspecified: Secondary | ICD-10-CM | POA: Diagnosis not present

## 2023-12-22 DIAGNOSIS — Z960 Presence of urogenital implants: Secondary | ICD-10-CM | POA: Diagnosis not present

## 2023-12-22 DIAGNOSIS — Z89612 Acquired absence of left leg above knee: Secondary | ICD-10-CM | POA: Diagnosis not present

## 2023-12-22 DIAGNOSIS — I70221 Atherosclerosis of native arteries of extremities with rest pain, right leg: Secondary | ICD-10-CM | POA: Diagnosis not present

## 2023-12-22 DIAGNOSIS — Z993 Dependence on wheelchair: Secondary | ICD-10-CM | POA: Diagnosis not present

## 2023-12-22 DIAGNOSIS — I10 Essential (primary) hypertension: Secondary | ICD-10-CM | POA: Diagnosis not present

## 2023-12-26 ENCOUNTER — Other Ambulatory Visit: Payer: Self-pay

## 2023-12-26 DIAGNOSIS — I70235 Atherosclerosis of native arteries of right leg with ulceration of other part of foot: Secondary | ICD-10-CM

## 2023-12-27 ENCOUNTER — Ambulatory Visit: Admitting: Urology

## 2023-12-27 DIAGNOSIS — M79672 Pain in left foot: Secondary | ICD-10-CM | POA: Diagnosis not present

## 2023-12-27 DIAGNOSIS — M79671 Pain in right foot: Secondary | ICD-10-CM | POA: Diagnosis not present

## 2023-12-27 DIAGNOSIS — L110 Acquired keratosis follicularis: Secondary | ICD-10-CM | POA: Diagnosis not present

## 2023-12-27 DIAGNOSIS — M79674 Pain in right toe(s): Secondary | ICD-10-CM | POA: Diagnosis not present

## 2023-12-27 DIAGNOSIS — M79675 Pain in left toe(s): Secondary | ICD-10-CM | POA: Diagnosis not present

## 2023-12-27 DIAGNOSIS — I739 Peripheral vascular disease, unspecified: Secondary | ICD-10-CM | POA: Diagnosis not present

## 2023-12-29 DIAGNOSIS — N390 Urinary tract infection, site not specified: Secondary | ICD-10-CM | POA: Diagnosis not present

## 2024-01-23 ENCOUNTER — Ambulatory Visit (HOSPITAL_COMMUNITY)
Admission: RE | Admit: 2024-01-23 | Discharge: 2024-01-23 | Disposition: A | Discharge status: Home / Self Care | Source: Ambulatory Visit | Attending: Surgery | Admitting: Surgery

## 2024-01-23 ENCOUNTER — Ambulatory Visit (INDEPENDENT_AMBULATORY_CARE_PROVIDER_SITE_OTHER): Admitting: Physician Assistant

## 2024-01-23 ENCOUNTER — Ambulatory Visit (HOSPITAL_BASED_OUTPATIENT_CLINIC_OR_DEPARTMENT_OTHER)
Admission: RE | Admit: 2024-01-23 | Discharge: 2024-01-23 | Disposition: A | Discharge status: Home / Self Care | Source: Ambulatory Visit | Attending: Surgery | Admitting: Surgery

## 2024-01-23 VITALS — BP 146/100 | HR 73 | Temp 97.9°F

## 2024-01-23 DIAGNOSIS — I739 Peripheral vascular disease, unspecified: Secondary | ICD-10-CM | POA: Diagnosis present

## 2024-01-23 DIAGNOSIS — F17200 Nicotine dependence, unspecified, uncomplicated: Secondary | ICD-10-CM | POA: Diagnosis present

## 2024-01-23 DIAGNOSIS — I70235 Atherosclerosis of native arteries of right leg with ulceration of other part of foot: Secondary | ICD-10-CM

## 2024-01-23 LAB — VAS US ABI WITH/WO TBI: Right ABI: 1.06

## 2024-01-23 NOTE — Progress Notes (Signed)
 Office Note     CC:  follow up Requesting Provider:  Carlette Benita Area*  HPI: Crystal Patterson is a 58 y.o. ([birth year]) female who presents for follow up of PAD. She recently presented to ER with CLI of her RLE. She was found to have occlusion of previously placed right SFA stent. On 12/09/23 she had Angiogram with EKOS lytic catheter placement by Dr. Lanis. This was followed by Mechanical thrombectomy right superficial femoral-popliteal artery; Stent, right superficial femoral and popliteal artery (6 mm Viabahn) and Angioplasty right tibioperoneal trunk and posterior tibial artery by Dr. Serene on 12/10/23. She had successful revascularization with this and three vessel runoff. She was recommended being on triple therapy for 1 month with Aspirin , Eliquis  and Plavix .   Today she reports overall doing okay. Her prior rest pain remains resolved. She says her right foot always stays cold. This is sometimes painful to her but she says it comes and goes. She has not been taking her Aspirin , Eliquis  or Plavix  as she reports someone told her to stop them. She does not ambulate but uses her right leg to help her move her wheel chair. She does not have any rest pain or wounds.   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   Lupus    Myocardial infarction [facility])    Peripheral vascular disease     Past Surgical History:  Procedure Laterality Date   ABDOMINAL AORTOGRAM W/LOWER EXTREMITY N/A 06/30/2022   Procedure: ABDOMINAL AORTOGRAM W/LOWER EXTREMITY;  Surgeon: Lanis Fonda BRAVO, MD;   Location: [facility] INVASIVE CV LAB;  Service: Cardiovascular;  Laterality: N/A;   ABDOMINAL AORTOGRAM W/LOWER EXTREMITY N/A 12/09/2023   Procedure: ABDOMINAL AORTOGRAM W/LOWER EXTREMITY;  Surgeon: Lanis Fonda BRAVO, MD;  Location: [facility] INVASIVE CV LAB;  Service: Cardiovascular;  Laterality: N/A;   AMPUTATION Left 04/04/2020   Procedure: LEFT ABOVE KNEE AMPUTATION;  Surgeon: Harden Jerona GAILS, MD;  Location: [facility] OR;  Service: Orthopedics;  Laterality: Left;   AMPUTATION TOE Right 07/07/2022   Procedure: AMPUTATION TOE (FRIST RIGHT TOE);  Surgeon: Gershon Donnice SAUNDERS, DPM;  Location: [facility] OR;  Service: Podiatry;  Laterality: Right;   CARDIAC CATHETERIZATION  05/25/2016   COLONOSCOPY N/A 01/17/2019   Procedure: COLONOSCOPY;  Surgeon: Shaaron Lamar HERO, MD;  Location: [facility] END[facility] SUITE;  Service: Endoscopy;  Laterality: N/A;  10:30   EXCISIONAL TOTAL KNEE ARTHROPLASTY Left 02/05/2020   Procedure: REPEAT IRRIGATION AND DEBRIDEMENT LEFT KNEE;  Surgeon: Vernetta Lonni GRADE, MD;  Location: [facility] ORS;  Service: Orthopedics;  Laterality: Left;   I & D EXTREMITY Left 02/08/2020   Procedure: EXCISIONAL DEBRIDEMENT LEFT KNEE;  Surgeon: Harden Jerona GAILS, MD;  Location: [facility] OR;  Service: Orthopedics;  Laterality: Left;   I & D KNEE WITH POLY EXCHANGE Left 01/25/2020   Procedure: INCISION AND DRAINAGE LEFT KNEE WITH POLY-LINER EXCHANGE;  Surgeon: Vernetta Lonni GRADE, MD;  Location: [facility] ORS;  Service: Orthopedics;  Laterality: Left;   IRRIGATION AND DEBRIDEMENT KNEE Left 01/30/2020  Procedure: REPEAT IRRIGATION AND DEBRIDEMENT LEFT KNEE PLACEMENT OF WOUND VAC;  Surgeon: Vernetta Lonni GRADE, MD;  Location: [facility] ORS;  Service: Orthopedics;  Laterality: Left;   left finger amputations     LOWER EXTREMITY INTERVENTION Right 12/09/2023   Procedure: LOWER EXTREMITY INTERVENTION;  Surgeon: Lanis Fonda BRAVO, MD;  Location: [facility] INVASIVE CV LAB;  Service: Cardiovascular;  Laterality: Right;   LOWER EXTREMITY INTERVENTION  12/09/2023   Procedure:  LOWER EXTREMITY INTERVENTION;  Surgeon: Serene Gaile ORN, MD;  Location: [facility] INVASIVE CV LAB;  Service: Cardiovascular;;   PERIPHERAL VASCULAR INTERVENTION  06/30/2022   Procedure: PERIPHERAL VASCULAR INTERVENTION;  Surgeon: Lanis Fonda BRAVO, MD;  Location: [facility] INVASIVE CV LAB;  Service: Cardiovascular;;   PERIPHERAL VASCULAR ULTRASOUND/IVUS N/A 12/09/2023   Procedure: Peripheral Vascular Ultrasound/IVUS;  Surgeon: Serene Gaile ORN, MD;  Location: [facility] INVASIVE CV LAB;  Service: Cardiovascular;  Laterality: N/A;   POLYPECTOMY  01/17/2019   Procedure: POLYPECTOMY;  Surgeon: Shaaron Lamar HERO, MD;  Location: [facility]ITE;  Service: Endoscopy;;   right hand surgery     right knee arthroscopy     RIGHT/LEFT HEART CATH AND CORONARY ANGIOGRAPHY N/A 05/25/2016   Procedure: Right/Left Heart Cath and Coronary Angiography;  Surgeon: Debby DELENA Sor, MD;  Location: [facility] INVASIVE CV LAB;  Service: Cardiovascular;  Laterality: N/A;   SKIN SPLIT GRAFT Left 02/08/2020   Procedure: SKIN GRAFT SPLIT THICKNESS LEFT KNEE;  Surgeon: Harden Jerona GAILS, MD;  Location: [facility] OR;  Service: Orthopedics;  Laterality: Left;   TOTAL KNEE ARTHROPLASTY Left 01/04/2020   Procedure: LEFT TOTAL KNEE ARTHROPLASTY;  Surgeon: Vernetta Lonni GRADE, MD;  Location: [facility] ORS;  Service: Orthopedics;  Laterality: Left;    Social History   Socioeconomic History   Marital status: Single    Spouse name: Not on file   Number of children: Not on file   Years of education: Not on file   Highest education level: Not on file  Occupational History   Occupation: unemployed  Tobacco Use   Smoking status: Former    Current packs/day: 0.00    Average packs/day: 0.3 packs/day for 15.0 years (3.8 ttl pk-yrs)    Types: Cigarettes    Quit date: 08/30/2022    Years since quitting: 1.4   Smokeless tobacco: Never  Vaping Use   Vaping status: Never Used  Substance and Sexual Activity   Alcohol use: Yes    Alcohol/week: 2.0 - 3.0 standard drinks of alcohol     Types: 2 - 3 Cans of beer per week    Comment: daily   Drug use: Yes    Types: Marijuana    Comment: Last time 04/02/20   Sexual activity: Not Currently    Birth control/protection: Post-menopausal  Other Topics Concern   Not on file  Social History Narrative   Patient has been living alone; however, plans to discharge home to sister, Olivia, house   Social Drivers of Health   Financial Resource Strain: Low Risk  (04/22/2022)   Received from [facility]   Overall Financial Resource Strain (CARDIA)    Difficulty of Paying Living Expenses: Not hard at all  Food Insecurity: No Food Insecurity (12/09/2023)   Hunger Vital Sign    Worried About Running Out of Food in the Last Year: Never true    Ran Out of Food in the Last Year: Never true  Transportation Needs: No Transportation Needs (12/09/2023)   PRAPARE - Administrator, Civil Service (Medical): No  Lack of Transportation (Non-Medical): No  Physical Activity: Not on file  Stress: No Stress Concern Present (04/22/2022)   Received from [facility] - Occupational Stress Questionnaire    Feeling of Stress : Not at all  Social Connections: Not on file  Intimate Partner Violence: Not At Risk (12/09/2023)   Humiliation, Afraid, Rape, and Kick questionnaire    Fear of Current or Ex-Partner: No    Emotionally Abused: No    Physically Abused: No    Sexually Abused: No    Family History  Problem Relation Age of Onset   Pancreatic cancer Mother    Colon cancer Father     Current Outpatient Medications  Medication Sig Dispense Refill   acetaminophen  (TYLENOL ) 500 MG tablet Take 1,000 mg by mouth 2 (two) times daily as needed for headache or fever (pain).     allopurinol  (ZYLOPRIM ) 100 MG tablet Take 1 tablet (100 mg total) by mouth daily. 30 tablet 0   apixaban  (ELIQUIS ) 5 MG TABS tablet Take 1 tablet (5 mg total) by mouth 2 (two) times daily. (Patient not taking: Reported  on 01/23/2024) 60 tablet 0   aspirin  EC 81 MG tablet Take 81 mg by mouth daily.     atorvastatin  (LIPITOR ) 40 MG tablet Take 1 tablet (40 mg total) by mouth daily at 6 PM. (Patient not taking: Reported on 01/23/2024) 30 tablet 0   clopidogrel  (PLAVIX ) 75 MG tablet Take 1 tablet (75 mg total) by mouth daily. (Patient not taking: Reported on 01/23/2024) 30 tablet 0   furosemide  (LASIX ) 40 MG tablet Take 1 tablet (40 mg total) by mouth daily. 30 tablet 0   gabapentin  (NEURONTIN ) 300 MG capsule Take 1 capsule (300 mg total) by mouth daily. (Patient taking differently: Take 300 mg by mouth See admin instructions. Take 1 capsule (300mg ) by mouth every morning, may take an additional 1 capsule in the evening if needed for pain, neuropathy.) 30 capsule 0   ibuprofen  (ADVIL ) 800 MG tablet Take 800 mg by mouth 2 (two) times daily as needed for moderate pain.     omeprazole  (PRILOSEC) 20 MG capsule Take 1 capsule (20 mg total) by mouth daily. 30 capsule 0   potassium chloride  (KLOR-CON ) 10 MEQ tablet Take 1 tablet (10 mEq total) by mouth daily. 30 tablet 0   traZODone (DESYREL) 100 MG tablet Take 150 mg by mouth at bedtime.     No current facility-administered medications for this visit.    No Known Allergies   REVIEW OF SYSTEMS:  Negative unless noted in HPI [X]  denotes positive finding, [ ]  denotes negative finding Cardiac  Comments:  Chest pain or chest pressure:    Shortness of breath upon exertion:    Short of breath when lying flat:    Irregular heart rhythm:        Vascular    Pain in calf, thigh, or hip brought on by ambulation:    Pain in feet at night that wakes you up from your sleep:     Blood clot in your veins:    Leg swelling:         Pulmonary    Oxygen  at home:    Productive cough:     Wheezing:         Neurologic    Sudden weakness in arms or legs:     Sudden numbness in arms or legs:     Sudden onset of difficulty speaking or slurred  speech:    Temporary loss of  vision in one eye:     Problems with dizziness:         Gastrointestinal    Blood in stool:     Vomited blood:         Genitourinary    Burning when urinating:     Blood in urine:        Psychiatric    Major depression:         Hematologic    Bleeding problems:    Problems with blood clotting too easily:        Skin    Rashes or ulcers:        Constitutional    Fever or chills:      PHYSICAL EXAMINATION:  Vitals:   01/23/24 1441  BP: (!) 146/100  Pulse: 73  Temp: 97.9 F (36.6 C)  TempSrc: Temporal   General:  WDWN in NAD; vital signs documented above Gait: Not observed, wheel chair HENT: WNL, normocephalic Pulmonary: normal non-labored breathing Cardiac: regular HR Abdomen: soft Vascular Exam/Pulses: 2+ femoral pulses, no palpable distal pulses Extremities: without ischemic changes, without Gangrene , without cellulitis; without open wounds; right great toe amputation site healed Musculoskeletal: no muscle wasting or atrophy  Neurologic: A&O X 3 Psychiatric:  The pt has Normal affect.   Non-Invasive Vascular Imaging:   +-------+-----------+-----------+------------+------------+  ABI/TBIToday's ABIToday's TBIPrevious ABIPrevious TBI  +-------+-----------+-----------+------------+------------+  Right 1.06       amputated                            +-------+-----------+-----------+------------+------------+  Left  AKA                                             +-------+-----------+-----------+------------+------------+   VAS US  Lower extremity arterial duplex: Summary:  Right: Patent prox superfifical femoral artery to prox popliteal artery stent. There is no evidence of in-stent stenosis. The posterior tibial artery and tibioperoneal trunk are not well visualized.   ASSESSMENT/PLAN:: 58 y.o. female here for follow up of PAD. She recently presented to ER with CLI of her RLE. She was found to have occlusion of previously placed right SFA  stent. On 12/09/23 she had Angiogram with EKOS lytic catheter placement by Dr. Lanis. This was followed by Mechanical thrombectomy right superficial femoral-popliteal artery; Stent, right superficial femoral and popliteal artery (6 mm Viabahn) and Angioplasty right tibioperoneal trunk and posterior tibial artery by Dr. Serene on 12/10/23. She does not have rest pain or tissue loss. Her prior great toe amputation site remains healed.  - ABI is essentially unchanged - On duplex it appears that the previously intervened on TPT and PT are not well visualized on duplex and may likely be occluded - Resume Aspirin , Eliquis  and statin - okay to stop Plavix  now - She will follow up in 3 months with RLE arterial duplex and ABI    Teretha Damme, PA-C [facility] [phone]  On call MD:   [place]

## 2024-01-25 ENCOUNTER — Other Ambulatory Visit: Payer: Self-pay

## 2024-01-25 DIAGNOSIS — I739 Peripheral vascular disease, unspecified: Secondary | ICD-10-CM

## 2024-04-16 ENCOUNTER — Ambulatory Visit (HOSPITAL_COMMUNITY)

## 2024-04-16 ENCOUNTER — Ambulatory Visit

## 2024-04-16 ENCOUNTER — Ambulatory Visit (HOSPITAL_COMMUNITY): Admission: RE | Admit: 2024-04-16 | Source: Ambulatory Visit
